# Patient Record
Sex: Male | Born: 1945 | Race: White | Hispanic: No | Marital: Married | Smoking: Never smoker
Health system: Southern US, Community
[De-identification: ages and names within clinical notes are randomized; demographics above are authoritative.]

## PROBLEM LIST (undated history)

## (undated) DIAGNOSIS — E1165 Type 2 diabetes mellitus with hyperglycemia: Secondary | ICD-10-CM

## (undated) DIAGNOSIS — Z9889 Other specified postprocedural states: Secondary | ICD-10-CM

## (undated) DIAGNOSIS — N400 Enlarged prostate without lower urinary tract symptoms: Secondary | ICD-10-CM

## (undated) DIAGNOSIS — R229 Localized swelling, mass and lump, unspecified: Secondary | ICD-10-CM

## (undated) DIAGNOSIS — K760 Fatty (change of) liver, not elsewhere classified: Secondary | ICD-10-CM

## (undated) DIAGNOSIS — R112 Nausea with vomiting, unspecified: Secondary | ICD-10-CM

## (undated) DIAGNOSIS — N200 Calculus of kidney: Secondary | ICD-10-CM

## (undated) DIAGNOSIS — E291 Testicular hypofunction: Secondary | ICD-10-CM

## (undated) DIAGNOSIS — D869 Sarcoidosis, unspecified: Secondary | ICD-10-CM

## (undated) DIAGNOSIS — IMO0002 Reserved for concepts with insufficient information to code with codable children: Secondary | ICD-10-CM

## (undated) DIAGNOSIS — E119 Type 2 diabetes mellitus without complications: Secondary | ICD-10-CM

## (undated) DIAGNOSIS — N1330 Unspecified hydronephrosis: Secondary | ICD-10-CM

## (undated) DIAGNOSIS — R51 Headache: Secondary | ICD-10-CM

## (undated) DIAGNOSIS — R519 Headache, unspecified: Secondary | ICD-10-CM

## (undated) DIAGNOSIS — I10 Essential (primary) hypertension: Secondary | ICD-10-CM

## (undated) DIAGNOSIS — G4733 Obstructive sleep apnea (adult) (pediatric): Secondary | ICD-10-CM

## (undated) DIAGNOSIS — K219 Gastro-esophageal reflux disease without esophagitis: Secondary | ICD-10-CM

## (undated) DIAGNOSIS — M1990 Unspecified osteoarthritis, unspecified site: Secondary | ICD-10-CM

## (undated) DIAGNOSIS — Z87442 Personal history of urinary calculi: Secondary | ICD-10-CM

## (undated) DIAGNOSIS — E559 Vitamin D deficiency, unspecified: Secondary | ICD-10-CM

## (undated) HISTORY — DX: Vitamin D deficiency, unspecified: E55.9

## (undated) HISTORY — DX: Benign prostatic hyperplasia without lower urinary tract symptoms: N40.0

## (undated) HISTORY — PX: COLONOSCOPY: SHX174

## (undated) HISTORY — PX: KNEE ARTHROSCOPY: SUR90

## (undated) HISTORY — DX: Type 2 diabetes mellitus without complications: E11.9

## (undated) HISTORY — DX: Type 2 diabetes mellitus with hyperglycemia: E11.65

## (undated) HISTORY — DX: Obstructive sleep apnea (adult) (pediatric): G47.33

## (undated) HISTORY — DX: Essential (primary) hypertension: I10

## (undated) HISTORY — DX: Gastro-esophageal reflux disease without esophagitis: K21.9

## (undated) HISTORY — DX: Testicular hypofunction: E29.1

## (undated) HISTORY — PX: APPENDECTOMY: SHX54

---
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
## 1994-07-24 HISTORY — PX: ROTATOR CUFF REPAIR: SHX139

## 1995-07-25 HISTORY — PX: ROTATOR CUFF REPAIR: SHX139

## 1998-10-29 ENCOUNTER — Ambulatory Visit (HOSPITAL_COMMUNITY): Admission: RE | Admit: 1998-10-29 | Discharge: 1998-10-29 | Payer: Self-pay | Admitting: Internal Medicine

## 1999-12-02 ENCOUNTER — Ambulatory Visit (HOSPITAL_COMMUNITY): Admission: RE | Admit: 1999-12-02 | Discharge: 1999-12-02 | Payer: Self-pay | Admitting: Internal Medicine

## 1999-12-02 ENCOUNTER — Encounter: Payer: Self-pay | Admitting: Internal Medicine

## 2000-11-30 ENCOUNTER — Encounter: Payer: Self-pay | Admitting: Internal Medicine

## 2000-11-30 ENCOUNTER — Ambulatory Visit (HOSPITAL_COMMUNITY): Admission: RE | Admit: 2000-11-30 | Discharge: 2000-11-30 | Payer: Self-pay | Admitting: Internal Medicine

## 2002-07-24 HISTORY — PX: HERNIA REPAIR: SHX51

## 2002-09-26 ENCOUNTER — Encounter: Payer: Self-pay | Admitting: Surgery

## 2002-09-26 ENCOUNTER — Ambulatory Visit (HOSPITAL_COMMUNITY): Admission: RE | Admit: 2002-09-26 | Discharge: 2002-09-26 | Payer: Self-pay | Admitting: Surgery

## 2005-07-24 LAB — HM COLONOSCOPY

## 2005-11-19 ENCOUNTER — Emergency Department (HOSPITAL_COMMUNITY): Admission: EM | Admit: 2005-11-19 | Discharge: 2005-11-19 | Payer: Self-pay | Admitting: Emergency Medicine

## 2006-01-07 ENCOUNTER — Encounter: Admission: RE | Admit: 2006-01-07 | Discharge: 2006-01-07 | Payer: Self-pay | Admitting: Orthopedic Surgery

## 2006-02-27 ENCOUNTER — Ambulatory Visit (HOSPITAL_COMMUNITY): Admission: RE | Admit: 2006-02-27 | Discharge: 2006-02-27 | Payer: Self-pay | Admitting: Orthopedic Surgery

## 2006-04-04 ENCOUNTER — Ambulatory Visit (HOSPITAL_COMMUNITY): Admission: RE | Admit: 2006-04-04 | Discharge: 2006-04-04 | Payer: Self-pay | Admitting: Gastroenterology

## 2006-04-04 ENCOUNTER — Encounter (INDEPENDENT_AMBULATORY_CARE_PROVIDER_SITE_OTHER): Payer: Self-pay | Admitting: Specialist

## 2007-04-01 ENCOUNTER — Ambulatory Visit (HOSPITAL_COMMUNITY): Admission: RE | Admit: 2007-04-01 | Discharge: 2007-04-01 | Payer: Self-pay | Admitting: Internal Medicine

## 2010-12-09 NOTE — Op Note (Signed)
   NAME:  Phillip Powell, Phillip Powell                        ACCOUNT NO.:  [id number]   MEDICAL RECORD NO.:  [id number]                   PATIENT TYPE:  AMB   LOCATION:  DAY                                  FACILITY:  [facility]   PHYSICIAN:  Coralie Keens, M.D.              DATE OF BIRTH:  [birth year]   DATE OF PROCEDURE:  09/26/2002  DATE OF DISCHARGE:                                 OPERATIVE REPORT   PREOPERATIVE DIAGNOSIS:  Umbilical hernia.   POSTOPERATIVE DIAGNOSIS:  Umbilical hernia.   PROCEDURE:  Umbilical hernia repair.   SURGEON:  Douglas A. Ninfa Linden, M.D.   ANESTHESIA:  General endotracheal anesthesia and 0.25% Marcaine.   ESTIMATED BLOOD LOSS:  Minimal.   PROCEDURE IN DETAIL:  The patient was brought to the operating room and  identified as [name].  He was placed supine on the operating table,  and general anesthesia was induced.  His abdomen was then prepped and draped  in the usual sterile fashion.  Using a #15 blade, a small transverse  incision made just above the upper edge at the umbilicus.  The incision was  carried down to the hernia sac which was easily identified and excised.  It  did contain only omentum.  The omentum was then placed back into the  umbilical cavity.  The umbilical hernia defect itself was quite small.  The  defect was closed with several figure-of-eight #1 Novofil pop-off sutures.  The umbilical skin was then tacked back in place with a single 2-0 Vicryl  suture.  The subcutaneous layer was closed with interrupted 3-0 Vicryl  sutures.  Skin was closed with running 4-0 Monocryl.  Steri-Strips, gauze,  and tape were then applied.  The patient tolerated the procedure well.  All  sponge, needle, and instrument counts were correct at the end of the  procedure.  The patient was then extubated in the operating room and taken  in stable condition to the recovery room.                                               Coralie Keens, M.D.    DB/MEDQ   D:  09/26/2002  T:  09/26/2002  Job:  [id number]

## 2010-12-09 NOTE — Op Note (Signed)
NAME:  Phillip Powell, Phillip Powell              ACCOUNT NO.:  [id number]   MEDICAL RECORD NO.:  [id number]          PATIENT TYPE:  AMB   LOCATION:  ENDO                         FACILITY:  [facility]   PHYSICIAN:  Nelwyn Salisbury, M.D.  DATE OF BIRTH:  January 22, 1946   DATE OF PROCEDURE:  04/04/2006  DATE OF DISCHARGE:                                 OPERATIVE REPORT   PROCEDURE PERFORMED:  Colonoscopy with cold biopsies x6.   ENDOSCOPIST:  Nelwyn Salisbury, M.D.   INSTRUMENT USED:  Olympus video colonoscope.   INDICATION FOR PROCEDURE:  A 65 year old white male with a family history of  colon cancer in his paternal grandmother, undergoing a screening colonoscopy  to repeat colonic polyps, masses, etc.   PREPROCEDURE PREPARATION:  Informed consent was procured from the patient.  The patient was fasted for four hours prior to the procedure and prepped  with 20 OsmoPrep pills the night prior to the procedure and 12 OsmoPrep  pills the morning of the procedure.  The risks and benefits of the  procedure, including a 10% miss rate of cancer and polyps, were discussed  with the patient as well.   PREPROCEDURE PHYSICAL:  VITAL SIGNS:  The patient had stable vital signs.  NECK:  Supple.  CHEST:  Clear to auscultation.  S1, S2 regular.  ABDOMEN:  Soft with normal bowel sounds.   DESCRIPTION OF PROCEDURE:  The patient was placed in the left lateral  decubitus position and sedated with 75 mcg of fentanyl and 7.5 mg of Versed  in slow incremental doses.  Once the patient was adequately sedate and  maintained on low-flow oxygen and continuous cardiac monitoring, the Olympus  video colonoscope was advanced from the rectum to the cecum.  The  appendiceal orifice and ileocecal valve were clearly visualized and  photographed.  Multiple washes were done.  The terminal ileum appeared  healthy and without lesions.  A small sessile polyp was biopsied from the  proximal right colon (cold biopsies x2) and another small  sessile polyp was  biopsied from the rectum (cold biopsies x4).  Retroflexion in the rectum  revealed small internal hemorrhoids.  The patient tolerated the procedure  well without complications.  There was no evidence of diverticulosis.   IMPRESSION:  1. One small sessile polyp biopsied from the rectum (cold biopsies x4).  2. Another small sessile polyp biopsied from the proximal right colon      (cold biopsies x2).  3. Small internal hemorrhoids seen on retroflexion.  4. Normal terminal ileum.  5. No evidence of diverticulosis.   RECOMMENDATIONS:  1. Await pathology results.  2. Repeat colonoscopy depending on pathology results.  3. Avoid all nonsteroidals including aspirin for the next 2 weeks.  4. Outpatient follow-up as the need arises in the future.      Nelwyn Salisbury, M.D.  Electronically Signed     JNM/MEDQ  D:  04/04/2006  T:  04/05/2006  Job:  [id number]   cc:   Unk Pinto, M.D.

## 2010-12-09 NOTE — Op Note (Signed)
NAME:  Phillip Powell, Phillip Powell              ACCOUNT NO.:  [id number]   MEDICAL RECORD NO.:  [id number]          PATIENT TYPE:  AMB   LOCATION:  DAY                          FACILITY:  [facility]   PHYSICIAN:  Ronald A. Gioffre, M.D.DATE OF BIRTH:  07-Oct-1945   DATE OF PROCEDURE:  02/27/2006  DATE OF DISCHARGE:                                 OPERATIVE REPORT   PREOPERATIVE DIAGNOSIS:  1.  Degenerative arthritis of the left knee.  2.  Torn medial meniscus of the left knee.  3.  Torn lateral meniscus of the left knee.  4.  Chronic synovitis of the left knee.   POSTOPERATIVE DIAGNOSIS:  1.  Degenerative arthritis of the left knee.  2.  Torn medial meniscus of the left knee.  3.  Torn lateral meniscus of the left knee.  4.  Chronic synovitis of the left knee.   OPERATION:  1.  Diagnostic arthroscopy left knee.  2.  Medial meniscectomy left knee.  3.  Lateral meniscectomy left knee.  4.  Abrasion chondroplasty medial femoral condyle left knee.  5.  Synovectomy suprapatellar pouch left knee.   PROCEDURE:  Under general anesthesia, routine orthopedic prepping and  draping of the left knee was carried out.  A small punctate incision was  made in the suprapatellar pouch, inflow cannula was inserted, and the knee  was distended with saline.  Following that, another small punctate incision  was made in the anterolateral joint.  The arthroscope was entered with a  lateral approach and complete diagnostic arthroscopy was carried out.  I  introduced a resection device in the medial approach and did a partial  medial meniscectomy.  He had a large irregular tear of the posterior horn of  the medial meniscus.  He had severe arthritic changes in the medial joint.  I did an abrasion chondroplasty of the medial femoral condyle.  Following  that, I entered the lateral joint and did a lateral meniscectomy.  He had  arthritic changes in the lateral joint, as well.  I went up into the  suprapatellar pouch, he had  severe synovitis.  I introduced the [employer]  and cleaned out the knee and did a synovectomy.  Following that, we went  down and examined the cruciates, the cruciates were intact but the anterior  cruciate was quite irregular consistent with his arthritic condition.  I  thoroughly irrigated out the knee, cleaned the knee out, and removed all the  fluid.  I closed all three punctate incisions with 3-0 nylon suture.  I  injected 30 mL of 0.5% Marcaine with epinephrine into the knee joint.  Sterile Neosporin bundle dressing was applied.  He had 1 gram of IV Ancef  preoperatively.          ______________________________  Kipp Brood Gladstone Lighter, M.D.    RAG/MEDQ  D:  02/27/2006  T:  02/27/2006  Job:  [id number]

## 2012-08-19 ENCOUNTER — Other Ambulatory Visit (HOSPITAL_COMMUNITY): Payer: Self-pay | Admitting: Internal Medicine

## 2012-08-19 ENCOUNTER — Ambulatory Visit (HOSPITAL_COMMUNITY)
Admission: RE | Admit: 2012-08-19 | Discharge: 2012-08-19 | Disposition: A | Discharge status: Home / Self Care | Payer: Managed Care, Other (non HMO) | Source: Ambulatory Visit | Attending: Internal Medicine | Admitting: Internal Medicine

## 2012-08-19 DIAGNOSIS — I10 Essential (primary) hypertension: Secondary | ICD-10-CM | POA: Insufficient documentation

## 2012-08-19 DIAGNOSIS — E119 Type 2 diabetes mellitus without complications: Secondary | ICD-10-CM | POA: Insufficient documentation

## 2012-08-19 DIAGNOSIS — Z0000 Encounter for general adult medical examination without abnormal findings: Secondary | ICD-10-CM | POA: Insufficient documentation

## 2013-06-21 ENCOUNTER — Encounter: Payer: Self-pay | Admitting: Internal Medicine

## 2013-06-21 DIAGNOSIS — G4733 Obstructive sleep apnea (adult) (pediatric): Secondary | ICD-10-CM | POA: Insufficient documentation

## 2013-06-21 DIAGNOSIS — K219 Gastro-esophageal reflux disease without esophagitis: Secondary | ICD-10-CM | POA: Insufficient documentation

## 2013-06-21 DIAGNOSIS — N400 Enlarged prostate without lower urinary tract symptoms: Secondary | ICD-10-CM | POA: Insufficient documentation

## 2013-06-21 DIAGNOSIS — E1165 Type 2 diabetes mellitus with hyperglycemia: Secondary | ICD-10-CM | POA: Insufficient documentation

## 2013-06-21 DIAGNOSIS — I10 Essential (primary) hypertension: Secondary | ICD-10-CM | POA: Insufficient documentation

## 2013-06-21 DIAGNOSIS — E119 Type 2 diabetes mellitus without complications: Secondary | ICD-10-CM | POA: Insufficient documentation

## 2013-06-21 DIAGNOSIS — E559 Vitamin D deficiency, unspecified: Secondary | ICD-10-CM | POA: Insufficient documentation

## 2013-06-21 DIAGNOSIS — E291 Testicular hypofunction: Secondary | ICD-10-CM | POA: Insufficient documentation

## 2013-06-23 ENCOUNTER — Encounter: Payer: Self-pay | Admitting: Emergency Medicine

## 2013-06-23 ENCOUNTER — Ambulatory Visit (INDEPENDENT_AMBULATORY_CARE_PROVIDER_SITE_OTHER): Payer: Managed Care, Other (non HMO) | Admitting: Emergency Medicine

## 2013-06-23 VITALS — BP 130/88 | HR 78 | Temp 97.8°F | Resp 16 | Ht 68.5 in | Wt 248.0 lb

## 2013-06-23 DIAGNOSIS — E559 Vitamin D deficiency, unspecified: Secondary | ICD-10-CM

## 2013-06-23 DIAGNOSIS — Z23 Encounter for immunization: Secondary | ICD-10-CM

## 2013-06-23 DIAGNOSIS — Z79899 Other long term (current) drug therapy: Secondary | ICD-10-CM

## 2013-06-23 DIAGNOSIS — I10 Essential (primary) hypertension: Secondary | ICD-10-CM

## 2013-06-23 DIAGNOSIS — E782 Mixed hyperlipidemia: Secondary | ICD-10-CM

## 2013-06-23 DIAGNOSIS — E119 Type 2 diabetes mellitus without complications: Secondary | ICD-10-CM

## 2013-06-23 LAB — CBC WITH DIFFERENTIAL/PLATELET
Basophils Absolute: 0 10*3/uL (ref 0.0–0.1)
Basophils Relative: 0 % (ref 0–1)
Eosinophils Absolute: 0.1 10*3/uL (ref 0.0–0.7)
Eosinophils Relative: 1 % (ref 0–5)
HCT: 46.4 % (ref 39.0–52.0)
Hemoglobin: 15.9 g/dL (ref 13.0–17.0)
Lymphocytes Relative: 25 % (ref 12–46)
Lymphs Abs: 2.4 10*3/uL (ref 0.7–4.0)
MCH: 27.3 pg (ref 26.0–34.0)
MCHC: 34.3 g/dL (ref 30.0–36.0)
MCV: 79.6 fL (ref 78.0–100.0)
Monocytes Absolute: 0.8 10*3/uL (ref 0.1–1.0)
Monocytes Relative: 9 % (ref 3–12)
Neutro Abs: 6.1 10*3/uL (ref 1.7–7.7)
Neutrophils Relative %: 65 % (ref 43–77)
Platelets: 218 10*3/uL (ref 150–400)
RBC: 5.83 MIL/uL — ABNORMAL HIGH (ref 4.22–5.81)
RDW: 14.6 % (ref 11.5–15.5)
WBC: 9.5 10*3/uL (ref 4.0–10.5)

## 2013-06-23 LAB — HEPATIC FUNCTION PANEL
ALT: 19 U/L (ref 0–53)
AST: 21 U/L (ref 0–37)
Albumin: 4.2 g/dL (ref 3.5–5.2)
Alkaline Phosphatase: 65 U/L (ref 39–117)
Bilirubin, Direct: 0.1 mg/dL (ref 0.0–0.3)
Indirect Bilirubin: 0.6 mg/dL (ref 0.0–0.9)
Total Bilirubin: 0.7 mg/dL (ref 0.3–1.2)
Total Protein: 6.7 g/dL (ref 6.0–8.3)

## 2013-06-23 LAB — BASIC METABOLIC PANEL WITH GFR
BUN: 17 mg/dL (ref 6–23)
CO2: 31 mEq/L (ref 19–32)
Calcium: 9.8 mg/dL (ref 8.4–10.5)
Chloride: 97 mEq/L (ref 96–112)
Creat: 1.08 mg/dL (ref 0.50–1.35)
GFR, Est African American: 83 mL/min
GFR, Est Non African American: 72 mL/min
Glucose, Bld: 203 mg/dL — ABNORMAL HIGH (ref 70–99)
Potassium: 4.7 mEq/L (ref 3.5–5.3)
Sodium: 136 mEq/L (ref 135–145)

## 2013-06-23 LAB — HEMOGLOBIN A1C
Hgb A1c MFr Bld: 8.5 % — ABNORMAL HIGH (ref <5.7)
Mean Plasma Glucose: 197 mg/dL — ABNORMAL HIGH (ref <117)

## 2013-06-23 LAB — LIPID PANEL
Cholesterol: 175 mg/dL (ref 0–200)
HDL: 32 mg/dL — ABNORMAL LOW (ref >39)
LDL Cholesterol: 100 mg/dL — ABNORMAL HIGH (ref 0–99)
Total CHOL/HDL Ratio: 5.5 Ratio
Triglycerides: 214 mg/dL — ABNORMAL HIGH (ref <150)
VLDL: 43 mg/dL — ABNORMAL HIGH (ref 0–40)

## 2013-06-23 LAB — MAGNESIUM: Magnesium: 1.7 mg/dL (ref 1.5–2.5)

## 2013-06-23 MED ORDER — TESTOSTERONE 10 MG/ACT (2%) TD GEL: TRANSDERMAL | Status: DC

## 2013-06-23 NOTE — Patient Instructions (Signed)

## 2013-06-24 LAB — INSULIN, FASTING: Insulin fasting, serum: 28 u[IU]/mL (ref 3–28)

## 2013-06-24 LAB — VITAMIN D 25 HYDROXY (VIT D DEFICIENCY, FRACTURES): Vit D, 25-Hydroxy: 61 ng/mL (ref 30–89)

## 2013-06-24 NOTE — Progress Notes (Signed)
Subjective:    Patient ID: Phillip Powell, male    DOB: 07/28/1945, 67 y.o.   MRN: [id number]  HPI Comments: 67 yo presents for 3 month F/U for Obesity, HTN, Cholesterol, DM, D. Deficient. He has been trying to lose wt with decreased portions. He is eating descent but not as healthy as he should be. He has not been checking BS as regularly. He does not exercise regularly. He was not able to tolerate Invokana SX due to urine frequency with his job. He refuses retrial of Metformin despite uncontrolled levels. Last Abnormal LABS BS 267 TG 229 A1C 8.8 INSULIN 77. He does not check BP AD. He does check feet QD without any signs of diabetic complications.  He also has hypogonadism with low level at last OV but does not use RX AD.   Hypertension  Hyperlipidemia  Gastrophageal Reflux  Diabetes     Current Outpatient Prescriptions on File Prior to Visit  Medication Sig Dispense Refill  . atenolol (TENORMIN) 100 MG tablet Take 100 mg by mouth daily.      . Cholecalciferol (VITAMIN D) 2000 UNITS tablet Take 5,000 Units by mouth daily.       Marland Kitchen glimepiride (AMARYL) 4 MG tablet Take 4 mg by mouth 2 (two) times daily.      Marland Kitchen losartan-hydrochlorothiazide (HYZAAR) 100-25 MG per tablet Take 1 tablet by mouth daily.      . Magnesium 250 MG TABS Take by mouth 2 (two) times daily.      . ranitidine (ZANTAC) 300 MG tablet Take 300 mg by mouth at bedtime.      . sitaGLIPtin (JANUVIA) 100 MG tablet Take 100 mg by mouth daily.       No current facility-administered medications on file prior to visit.   ALLERGIES Ace inhibitors; Ibuprofen; Invokana; Metformin and related; and Quinidine   Past Medical History  Diagnosis Date  . Hypertension   . Diabetes mellitus without complication   . GERD (gastroesophageal reflux disease)   . Hypogonadism male   . OSA (obstructive sleep apnea)   . Vitamin D deficiency   . BPH (benign prostatic hypertrophy)     Review of Systems  All other systems reviewed  and are negative.    BP 130/88  Pulse 78  Temp(Src) 97.8 F (36.6 C) (Temporal)  Resp 16  Ht 5' 8.5" (1.74 m)  Wt 248 lb (112.492 kg)  BMI 37.16 kg/m2     Objective:   Physical Exam  Nursing note and vitals reviewed. Constitutional: He is oriented to person, place, and time. He appears well-developed and well-nourished.  HENT:  Head: Normocephalic and atraumatic.  Right Ear: External ear normal.  Left Ear: External ear normal.  Mouth/Throat: Oropharynx is clear and moist.  Eyes: Pupils are equal, round, and reactive to light.  Neck: Normal range of motion. Neck supple. No JVD present. No thyromegaly present.  Cardiovascular: Normal rate, regular rhythm, normal heart sounds and intact distal pulses.   Pulmonary/Chest: Effort normal and breath sounds normal.  Abdominal: Soft. Bowel sounds are normal. He exhibits no distension. There is no tenderness.  Musculoskeletal: Normal range of motion.  Lymphadenopathy:    He has no cervical adenopathy.  Neurological: He is alert and oriented to person, place, and time. No cranial nerve deficit.  Skin: Skin is warm and dry.  Psychiatric: He has a normal mood and affect. Judgment normal.          Assessment & Plan:  1. 3 month  F/U for Obesity, HTN, Cholesterol, DM, D. Deficient check labs. Try Janumet 50/500 mg sample x 1 box, 1 qd to see if tolerates metformin. Needs to continue wt loss, better diet and increase cardio. Needs to check BP/ BS routinely and call with abnormal. 2. Hypogonadism- take RX AD

## 2013-08-14 ENCOUNTER — Other Ambulatory Visit: Payer: Self-pay | Admitting: Internal Medicine

## 2013-08-18 ENCOUNTER — Other Ambulatory Visit: Payer: Self-pay | Admitting: Internal Medicine

## 2013-08-18 ENCOUNTER — Ambulatory Visit: Payer: Managed Care, Other (non HMO) | Admitting: Internal Medicine

## 2013-08-18 DIAGNOSIS — E785 Hyperlipidemia, unspecified: Secondary | ICD-10-CM

## 2013-08-18 DIAGNOSIS — Z79899 Other long term (current) drug therapy: Secondary | ICD-10-CM | POA: Insufficient documentation

## 2013-08-18 DIAGNOSIS — E1169 Type 2 diabetes mellitus with other specified complication: Secondary | ICD-10-CM | POA: Insufficient documentation

## 2013-08-18 NOTE — Progress Notes (Signed)
Patient ID: Phillip Powell, male   DOB: March 20, 1946, 68 y.o.   MRN: [id number]  Annual Screening Comprehensive Examination  This very nice 68 y.o.  male presents for complete physical.  Patient has been followed for HTN, T2 Diabetes, Hyperlipidemia, and Vitamin D Deficiency.   HTN predates since     . Patient's BP has been controlled at home.Today's  . Patient denies any cardiac symptoms as chest pain, palpitations, shortness of breath, dizziness or ankle swelling.   Patient's hyperlipidemia is controlled with diet and medications. Patient denies myalgias or other medication SE's. Last cholesterol last visit was  , triglycerides  , HDL    and LDL   .     Patient has T2 NIDDM since  with last A1c  . Patient denies reactive hypoglycemic symptoms, visual blurring, diabetic polys, or paresthesias.   Finally, patient has history of Vitamin D Deficiency with last vitamin D        Medication List       This list is accurate as of: 08/18/13  1:13 AM.  Always use your most recent med list.               atenolol 100 MG tablet  Commonly known as:  TENORMIN  Take 100 mg by mouth daily.     glimepiride 4 MG tablet  Commonly known as:  AMARYL  Take 4 mg by mouth 2 (two) times daily.     hydrochlorothiazide 25 MG tablet  Commonly known as:  HYDRODIURIL     losartan 100 MG tablet  Commonly known as:  COZAAR     losartan-hydrochlorothiazide 100-25 MG per tablet  Commonly known as:  HYZAAR  Take 1 tablet by mouth daily.     Magnesium 250 MG Tabs  Take by mouth 2 (two) times daily.     meloxicam 15 MG tablet  Commonly known as:  MOBIC     ranitidine 300 MG tablet  Commonly known as:  ZANTAC  Take 300 mg by mouth at bedtime.     sitaGLIPtin 100 MG tablet  Commonly known as:  JANUVIA  Take 100 mg by mouth daily.     Testosterone 10 MG/ACT (2%) Gel  Commonly known as:  FORTESTA  2 pumps qd     TESTIM 50 MG/5GM Gel  Generic drug:  testosterone  APPLY 2 TUBES ONCE DAILY AS  DIRECTED     Vitamin D 2000 UNITS tablet  Take 5,000 Units by mouth daily.        Allergies  Allergen Reactions  . Ace Inhibitors   . Ibuprofen     GI upset  . Invokana [Canagliflozin]   . Metformin And Related     Diarrhea  . Quinidine     Past Medical History  Diagnosis Date  . Hypertension   . Diabetes mellitus without complication   . GERD (gastroesophageal reflux disease)   . Hypogonadism male   . OSA (obstructive sleep apnea)   . Vitamin D deficiency   . BPH (benign prostatic hypertrophy)     Past Surgical History  Procedure Laterality Date  . Tonsillectomy  1952  . Rotator cuff repair Left 1996  . Rotator cuff repair Right 1997  . Hernia repair  [id number]    umbilical    Family History  Problem Relation Age of Onset  . Cancer Mother     throat  . Diabetes Mother   . Thyroid disease Father   . Cancer Father  lung  . Thyroid disease Sister   . Hypertension Son   . Hyperlipidemia Son   . Cancer Paternal Grandmother     colon    History   Social History  . Marital Status: Married    Spouse Name: N/A    Number of Children: N/A  . Years of Education: N/A   Occupational History  . Not on file.   Social History Main Topics  . Smoking status: Never Smoker   . Smokeless tobacco: Not on file  . Alcohol Use: Not on file  . Drug Use: Not on file  . Sexual Activity: Not on file   Other Topics Concern  . Not on file   Social History Narrative  . No narrative on file    ROS Constitutional: Denies fever, chills, weight loss/gain, headaches, insomnia, fatigue, night sweats, and change in appetite. Eyes: Denies redness, blurred vision, diplopia, discharge, itchy, watery eyes.  ENT: Denies discharge, congestion, post nasal drip, epistaxis, sore throat, earache, hearing loss, dental pain, Tinnitus, Vertigo, Sinus pain, snoring.  Cardio: Denies chest pain, palpitations, irregular heartbeat, syncope, dyspnea, diaphoresis, orthopnea, PND, claudication,  edema Respiratory: denies cough, dyspnea, DOE, pleurisy, hoarseness, laryngitis, wheezing.  Gastrointestinal: Denies dysphagia, heartburn, reflux, water brash, pain, cramps, nausea, vomiting, bloating, diarrhea, constipation, hematemesis, melena, hematochezia, jaundice, hemorrhoids Genitourinary: Denies dysuria, frequency, urgency, nocturia, hesitancy, discharge, hematuria, flank pain Musculoskeletal: Denies arthralgia, myalgia, stiffness, Jt. Swelling, pain, limp, and strain/sprain. Skin: Denies puritis, rash, hives, warts, acne, eczema, changing in skin lesion Neuro: No weakness, tremor, incoordination, spasms, paresthesia, pain Psychiatric: Denies confusion, memory loss, sensory loss Endocrine: Denies change in weight, skin, hair change, nocturia, and paresthesia, diabetic polys, visual blurring, hyper / hypo glycemic episodes.  Heme/Lymph: No excessive bleeding, bruising, or elarged lymph nodes.  There were no vitals filed for this visit.  Estimated body mass index is 37.16 kg/(m^2) as calculated from the following:   Height as of 06/23/13: 5' 8.5" (1.74 m).   Weight as of 06/23/13: 248 lb (112.492 kg).  Physical Exam General Appearance: Well nourished, in no apparent distress. Eyes: PERRLA, EOMs, conjunctiva no swelling or erythema, normal fundi and vessels. Sinuses: No frontal/maxillary tenderness ENT/Mouth: EACs patent / TMs  nl. Nares clear without erythema, swelling, mucoid exudates. Oral hygiene is good. No erythema, swelling, or exudate. Tongue normal, non-obstructing. Tonsils not swollen or erythematous. Hearing normal.  Neck: Supple, thyroid normal. No bruits, nodes or JVD. Respiratory: Respiratory effort normal.  BS equal and clear bilateral without rales, rhonci, wheezing or stridor. Cardio: Heart sounds are normal with regular rate and rhythm and no murmurs, rubs or gallops. Peripheral pulses are normal and equal bilaterally without edema. No aortic or femoral bruits. Chest:  symmetric with normal excursions and percussion.  Abdomen: Flat, soft, with bowl sounds. Nontender, no guarding, rebound, hernias, masses, or organomegaly.  Lymphatics: Non tender without lymphadenopathy.  Genitourinary: No hernias.Testes nl. DRE - prostate nl for age - smooth & firm w/o nodules. Musculoskeletal: Full ROM all peripheral extremities, joint stability, 5/5 strength, and normal gait. Skin: Warm and dry without rashes, lesions, cyanosis, clubbing or  ecchymosis.  Neuro: Cranial nerves intact, reflexes equal bilaterally. Normal muscle tone, no cerebellar symptoms. Sensation intact.  Pysch: Awake and oriented X 3, normal affect, insight and judgment appropriate.   Assessment and Plan  1. Annual Screening Examination 2. Hypertension  3. Hyperlipidemia 4. T2 NIDDM 5. Vitamin D Deficiency  Continue prudent diet as discussed, weight control, BP monitoring, regular exercise, and medications as discussed.  Discussed med effects and SE's. Routine screening labs and tests as requested with regular follow-up as recommended.

## 2013-08-18 NOTE — Patient Instructions (Signed)

## 2013-08-25 ENCOUNTER — Encounter: Payer: Self-pay | Admitting: Emergency Medicine

## 2013-08-25 ENCOUNTER — Ambulatory Visit (INDEPENDENT_AMBULATORY_CARE_PROVIDER_SITE_OTHER): Payer: Managed Care, Other (non HMO) | Admitting: Emergency Medicine

## 2013-08-25 VITALS — BP 124/80 | HR 72 | Temp 98.0°F | Resp 18 | Ht 68.5 in | Wt 252.0 lb

## 2013-08-25 DIAGNOSIS — E782 Mixed hyperlipidemia: Secondary | ICD-10-CM

## 2013-08-25 DIAGNOSIS — E119 Type 2 diabetes mellitus without complications: Secondary | ICD-10-CM

## 2013-08-25 DIAGNOSIS — I10 Essential (primary) hypertension: Secondary | ICD-10-CM

## 2013-08-25 LAB — CBC WITH DIFFERENTIAL/PLATELET
Basophils Absolute: 0.1 10*3/uL (ref 0.0–0.1)
Basophils Relative: 1 % (ref 0–1)
Eosinophils Absolute: 0.1 10*3/uL (ref 0.0–0.7)
Eosinophils Relative: 2 % (ref 0–5)
HCT: 45.4 % (ref 39.0–52.0)
Hemoglobin: 15.6 g/dL (ref 13.0–17.0)
Lymphocytes Relative: 28 % (ref 12–46)
Lymphs Abs: 2.2 10*3/uL (ref 0.7–4.0)
MCH: 27.9 pg (ref 26.0–34.0)
MCHC: 34.4 g/dL (ref 30.0–36.0)
MCV: 81.2 fL (ref 78.0–100.0)
Monocytes Absolute: 0.5 10*3/uL (ref 0.1–1.0)
Monocytes Relative: 7 % (ref 3–12)
Neutro Abs: 5 10*3/uL (ref 1.7–7.7)
Neutrophils Relative %: 62 % (ref 43–77)
Platelets: 201 10*3/uL (ref 150–400)
RBC: 5.59 MIL/uL (ref 4.22–5.81)
RDW: 14.5 % (ref 11.5–15.5)
WBC: 7.9 10*3/uL (ref 4.0–10.5)

## 2013-08-25 LAB — LIPID PANEL
Cholesterol: 161 mg/dL (ref 0–200)
HDL: 33 mg/dL — ABNORMAL LOW (ref >39)
LDL Cholesterol: 91 mg/dL (ref 0–99)
Total CHOL/HDL Ratio: 4.9 Ratio
Triglycerides: 187 mg/dL — ABNORMAL HIGH (ref <150)
VLDL: 37 mg/dL (ref 0–40)

## 2013-08-25 LAB — HEPATIC FUNCTION PANEL
ALT: 23 U/L (ref 0–53)
AST: 18 U/L (ref 0–37)
Albumin: 4.3 g/dL (ref 3.5–5.2)
Alkaline Phosphatase: 56 U/L (ref 39–117)
Bilirubin, Direct: 0.2 mg/dL (ref 0.0–0.3)
Indirect Bilirubin: 0.5 mg/dL (ref 0.2–1.2)
Total Bilirubin: 0.7 mg/dL (ref 0.2–1.2)
Total Protein: 6.4 g/dL (ref 6.0–8.3)

## 2013-08-25 LAB — BASIC METABOLIC PANEL WITH GFR
BUN: 14 mg/dL (ref 6–23)
CO2: 29 mEq/L (ref 19–32)
Calcium: 10.1 mg/dL (ref 8.4–10.5)
Chloride: 98 mEq/L (ref 96–112)
Creat: 0.96 mg/dL (ref 0.50–1.35)
GFR, Est African American: >89 mL/min
GFR, Est Non African American: 82 mL/min
Glucose, Bld: 199 mg/dL — ABNORMAL HIGH (ref 70–99)
Potassium: 4.1 mEq/L (ref 3.5–5.3)
Sodium: 137 mEq/L (ref 135–145)

## 2013-08-25 LAB — HEMOGLOBIN A1C
Hgb A1c MFr Bld: 8.4 % — ABNORMAL HIGH (ref <5.7)
Mean Plasma Glucose: 194 mg/dL — ABNORMAL HIGH (ref <117)

## 2013-08-25 MED ORDER — SITAGLIPTIN PHOS-METFORMIN HCL 50-500 MG PO TABS: 1.0000 | ORAL_TABLET | Freq: Two times a day (BID) | ORAL | Status: DC

## 2013-08-25 NOTE — Progress Notes (Signed)
Subjective:    Patient ID: Phillip Powell, male    DOB: 1946-01-03, 68 y.o.   MRN: [id number]  HPI Comments: 68 yo obese male presents for 3 month F/U for HTN, Cholesterol, DM, D. Deficient. He notes BP good at home. He started Janumet 50/500 at last OV BS has been 130-150. He can not tolerate higher dose due to diarrhea of metformin. He is eating a little better except holidays. He is checking feet routinely and denies skin break down or numbness. He keeps busy with work but denies cardio. He occasionally gets sick stomach if skips or prolonged time between eating.  LAST LABS BS 267 T 158 TG 229 H 30 L 82 A1C 8.8 MAG 1.7 INSULIN 77 D 52  Current Outpatient Prescriptions on File Prior to Visit  Medication Sig Dispense Refill  . atenolol (TENORMIN) 100 MG tablet Take 100 mg by mouth daily.      . Cholecalciferol (VITAMIN D) 2000 UNITS tablet Take 5,000 Units by mouth daily.       Marland Kitchen glimepiride (AMARYL) 4 MG tablet Take 4 mg by mouth 2 (two) times daily.      . hydrochlorothiazide (HYDRODIURIL) 25 MG tablet       . Magnesium 250 MG TABS Take by mouth 2 (two) times daily.      . meloxicam (MOBIC) 15 MG tablet       . ranitidine (ZANTAC) 300 MG tablet Take 300 mg by mouth at bedtime.      . TESTIM 50 MG/5GM GEL APPLY 2 TUBES ONCE DAILY AS DIRECTED  300 g  2  . losartan (COZAAR) 100 MG tablet       . losartan-hydrochlorothiazide (HYZAAR) 100-25 MG per tablet Take 1 tablet by mouth daily.       No current facility-administered medications on file prior to visit.   ALLERGIES Ace inhibitors; Ibuprofen; Invokana; Metformin and related; and Quinidine  Past Medical History  Diagnosis Date  . Hypertension   . Diabetes mellitus without complication   . GERD (gastroesophageal reflux disease)   . Hypogonadism male   . OSA (obstructive sleep apnea)   . Vitamin D deficiency   . BPH (benign prostatic hypertrophy)       Review of Systems  Gastrointestinal: Positive for nausea.  All other  systems reviewed and are negative.   BP 124/80  Pulse 72  Temp(Src) 98 F (36.7 C) (Temporal)  Resp 18  Ht 5' 8.5" (1.74 m)  Wt 252 lb (114.306 kg)  BMI 37.75 kg/m2     Objective:   Physical Exam  Nursing note and vitals reviewed. Constitutional: He is oriented to person, place, and time. He appears well-developed and well-nourished.  Obese  HENT:  Head: Normocephalic and atraumatic.  Right Ear: External ear normal.  Left Ear: External ear normal.  Nose: Nose normal.  Eyes: Conjunctivae and EOM are normal.  Neck: Normal range of motion. Neck supple. No JVD present. No thyromegaly present.  Cardiovascular: Normal rate, regular rhythm, normal heart sounds and intact distal pulses.   Pulmonary/Chest: Effort normal and breath sounds normal.  Abdominal: Soft. Bowel sounds are normal. He exhibits no distension and no mass. There is no tenderness. There is no rebound and no guarding.  Musculoskeletal: Normal range of motion. He exhibits no edema and no tenderness.  Lymphadenopathy:    He has no cervical adenopathy.  Neurological: He is alert and oriented to person, place, and time. He has normal reflexes. No cranial nerve deficit. Coordination  normal.  Skin: Skin is warm and dry.  Psychiatric: He has a normal mood and affect. His behavior is normal. Judgment and thought content normal.          Assessment & Plan:  1.  3 month F/U for Obesity, HTN, Cholesterol,DM, D. Deficient. Needs healthy diet, cardio QD and obtain healthy weight. Check Labs, Check BP if >130/80 call office, Check BS if >200 call office 2. Nausea- Advised better diet and q 2hr smaller portions, w/c if SX increase

## 2013-08-25 NOTE — Patient Instructions (Signed)
Diabetes, Eating Away From Home Sometimes, you might eat in a restaurant or have meals that are prepared by someone else. You can enjoy eating out. However, the portions in restaurants may be much larger than needed. Listed below are some ideas to help you choose foods that will keep your blood glucose (sugar) in better control.  TIPS FOR EATING OUT  Know your meal plan and how many carbohydrate servings you should have at each meal. You may wish to carry a copy of your meal plan in your purse or wallet. Learn the foods included in each food group.  Make a list of restaurants near you that offer healthy choices. Take a copy of the carry-out menus to see what they offer. Then, you can plan what you will order ahead of time.  Become familiar with serving sizes by practicing them at home using measuring cups and spoons. Once you learn to recognize portion sizes, you will be able to correctly estimate the amount of total carbohydrate you are allowed to eat at the restaurant. Ask for a takeout box if the portion is more than you should have. When your food comes, leave the amount you should have on the plate, and put the rest in the takeout box before you start eating.  Plan ahead if your mealtime will be different from usual. Check with your caregiver to find out how to time meals and medicine if you are taking insulin.  Avoid high-fat foods, such as fried foods, cream sauces, high-fat salad dressings, or any added butter or margarine.  Do not be afraid to ask questions. Ask your server about the portion size, cooking methods, ingredients and if items can be substituted. Restaurants do not list all available items on the menu. You can ask for your main entree to be prepared using skim milk, oil instead of butter or margarine, and without gravy or sauces. Ask your waiter or waitress to serve salad dressings, gravy, sauces, margarine, and sour cream on the side. You can then add the amount your meal plan  suggests.  Add more vegetables whenever possible.  Avoid items that are labeled "jumbo," "giant," "deluxe," or "supersized."  You may want to split an entre with someone and order an extra side salad.  Watch for hidden calories in foods like croutons, bacon, or cheese.  Ask your server to take away the bread basket or chips from your table.  Order a dinner salad as an appetizer. You can eat most foods served in a restaurant. Some foods are better choices than others. Breads and Starches  Recommended: All kinds of bread (wheat, rye, white, oatmeal, [place], [place], raisin), hard or soft dinner rolls, frankfurter or hamburger buns, small bagels, small corn or whole-wheat flour tortillas.  Avoid: Frosted or glazed breads, butter rolls, egg or cheese breads, croissants, sweet rolls, pastries, coffee cake, glazed or frosted doughnuts, muffins. Crackers  Recommended: Animal crackers, graham, rye, saltine, oyster, and matzoth crackers. Bread sticks, melba toast, rusks, pretzels, popcorn (without fat), zwieback toast.  Avoid: High-fat snack crackers or chips. Buttered popcorn. Cereals  Recommended: Hot and cold cereals. Whole grains such as oatmeal or shredded wheat are good choices.  Avoid: Sugar-coated or granola type cereals. Potatoes/Pasta/Rice/Beans  Recommended: Order baked, boiled, or mashed potatoes, rice or noodles without added fat, whole beans. Order gravies, butter, margarine, or sauces on the side so you can control the amount you add.  Avoid: Hash browns or fried potatoes. Potatoes, pasta, or rice prepared with cream or  cheese sauce. Potato or pasta salads prepared with large amounts of dressing. Fried beans or fried rice. Vegetables  Recommended: Order steamed, baked, boiled, or stewed vegetables without sauces or extra fat. Ask that sauce be served on the side. If vegetables are not listed on the menu, ask what is available.  Avoid: Vegetables prepared with cream,  butter, or cheese sauce. Fried vegetables. Salad Bars  Recommended: Many of the vegetables at a salad bar are considered "free." Use lemon juice, vinegar, or low-calorie salad dressing (fewer than 20 calories per serving) as "free" dressings for your salad. Look for salad bar ingredients that have no added fat or sugar such as tomatoes, lettuce, cucumbers, broccoli, carrots, onions, and mushrooms.  Avoid: Prepared salads with large amounts of dressing, such as coleslaw, caesar salad, macaroni salad, bean salad, or carrot salad. Fruit  Recommended: Eat fresh fruit or fresh fruit salad without added dressing. A salad bar often offers fresh fruit choices, but canned fruit at a restaurant is usually packed in sugar or syrup.  Avoid: Sweetened canned or frozen fruits, plain or sweetened fruit juice. Fruit salads with dressing, sour cream, or sugar added to them. Meat and Meat Substitutes  Recommended: Order broiled, baked, roasted, or grilled meat, poultry, or fish. Trim off all visible fat. Do not eat the skin of poultry. The size stated on the menu is the raw weight. Meat shrinks by  in cooking (for example, 4 oz raw equals 3 oz cooked meat).  Avoid: Deep-fat fried meat, poultry, or fish. Breaded meats. Eggs  Recommended: Order soft, hard-cooked, poached, or scrambled eggs. Omelets may be okay, depending on what ingredients are added. Egg substitutes are also a good choice.  Avoid: Fried eggs, eggs prepared with cream or cheese sauce. Milk  Recommended: Order low-fat or fat-free milk according to your meal plan. Plain, nonfat yogurt or flavored yogurt with no sugar added may be used as a substitute for milk. Soy milk may also be used.  Avoid: Milk shakes or sweetened milk beverages. Soups and Combination Foods  Recommended: Clear broth or consomm are "free" foods and may be used as an appetizer. Broth-based soups with fat removed count as a starch serving and are preferred over cream  soups. Soups made with beans or split peas may be eaten but count as a starch.  Avoid: Fatty soups, soup made with cream, cheese soup. Combination foods prepared with excessive amounts of fat or with cream or cheese sauces. Desserts and Sweets  Recommended: Ask for fresh fruit. Sponge or angel food cake without icing, ice milk, no sugar added ice cream, sherbet, or frozen yogurt may fit into your meal plan occasionally.  Avoid: Pastries, puddings, pies, cakes with icing, custard, gelatin desserts. Fats and Oils  Recommended: Choose healthy fats such as olive oil, canola oil, or tub margarine, reduced fat or fat-free sour cream, cream cheese, avocado, or nuts.  Avoid: Any fats in excess of your allowed portion. Deep-fried foods or any food with a large amount of fat. Note: Ask for all fats to be served on the side, and limit your portion sizes according to your meal plan. Document Released: 07/10/2005 Document Revised: 10/02/2011 Document Reviewed: 01/28/2009 [facility] Patient Information 2014 [place], [place]. Diabetes and Exercise Exercising regularly is important. It is not just about losing weight. It has many health benefits, such as:  Improving your overall fitness, flexibility, and endurance.  Increasing your bone density.  Helping with weight control.  Decreasing your body fat.  Increasing your  muscle strength.  Reducing stress and tension.  Improving your overall health. People with diabetes who exercise gain additional benefits because exercise:  Reduces appetite.  Improves the body's use of blood sugar (glucose).  Helps lower or control blood glucose.  Decreases blood pressure.  Helps control blood lipids (such as cholesterol and triglycerides).  Improves the body's use of the hormone insulin by:  Increasing the body's insulin sensitivity.  Reducing the body's insulin needs.  Decreases the risk for heart disease because exercising:  Lowers cholesterol and  triglycerides levels.  Increases the levels of good cholesterol (such as high-density lipoproteins [HDL]) in the body.  Lowers blood glucose levels. YOUR ACTIVITY PLAN  Choose an activity that you enjoy and set realistic goals. Your health care provider or diabetes educator can help you make an activity plan that works for you. You can break activities into 2 or 3 sessions throughout the day. Doing so is as good as one long session. Exercise ideas include:  Taking the dog for a walk.  Taking the stairs instead of the elevator.  Dancing to your favorite song.  Doing your favorite exercise with a friend. RECOMMENDATIONS FOR EXERCISING WITH TYPE 1 OR TYPE 2 DIABETES   Check your blood glucose before exercising. If blood glucose levels are greater than 240 mg/dL, check for urine ketones. Do not exercise if ketones are present.  Avoid injecting insulin into areas of the body that are going to be exercised. For example, avoid injecting insulin into:  The arms when playing tennis.  The legs when jogging.  Keep a record of:  Food intake before and after you exercise.  Expected peak times of insulin action.  Blood glucose levels before and after you exercise.  The type and amount of exercise you have done.  Review your records with your health care provider. Your health care provider will help you to develop guidelines for adjusting food intake and insulin amounts before and after exercising.  If you take insulin or oral hypoglycemic agents, watch for signs and symptoms of hypoglycemia. They include:  Dizziness.  Shaking.  Sweating.  Chills.  Confusion.  Drink plenty of water while you exercise to prevent dehydration or heat stroke. Body water is lost during exercise and must be replaced.  Talk to your health care provider before starting an exercise program to make sure it is safe for you. Remember, almost any type of activity is better than none. Document Released:  09/30/2003 Document Revised: 03/12/2013 Document Reviewed: 12/17/2012 [facility] Patient Information 2014 [place].

## 2013-08-26 LAB — INSULIN, FASTING: Insulin fasting, serum: 127 u[IU]/mL — ABNORMAL HIGH (ref 3–28)

## 2013-09-04 ENCOUNTER — Other Ambulatory Visit: Payer: Self-pay | Admitting: Internal Medicine

## 2013-09-22 ENCOUNTER — Other Ambulatory Visit: Payer: Self-pay | Admitting: Internal Medicine

## 2013-12-08 ENCOUNTER — Ambulatory Visit: Payer: Self-pay | Admitting: Emergency Medicine

## 2014-01-05 ENCOUNTER — Encounter: Payer: Self-pay | Admitting: Emergency Medicine

## 2014-01-05 ENCOUNTER — Other Ambulatory Visit: Payer: Self-pay | Admitting: Internal Medicine

## 2014-01-05 ENCOUNTER — Ambulatory Visit (INDEPENDENT_AMBULATORY_CARE_PROVIDER_SITE_OTHER): Payer: Managed Care, Other (non HMO) | Admitting: Emergency Medicine

## 2014-01-05 VITALS — BP 144/90 | HR 76 | Temp 98.4°F | Resp 18 | Ht 68.5 in | Wt 262.0 lb

## 2014-01-05 DIAGNOSIS — E119 Type 2 diabetes mellitus without complications: Secondary | ICD-10-CM

## 2014-01-05 DIAGNOSIS — Z125 Encounter for screening for malignant neoplasm of prostate: Secondary | ICD-10-CM

## 2014-01-05 DIAGNOSIS — I10 Essential (primary) hypertension: Secondary | ICD-10-CM

## 2014-01-05 DIAGNOSIS — Z23 Encounter for immunization: Secondary | ICD-10-CM

## 2014-01-05 DIAGNOSIS — E782 Mixed hyperlipidemia: Secondary | ICD-10-CM

## 2014-01-05 LAB — CBC WITH DIFFERENTIAL/PLATELET
Basophils Absolute: 0.1 10*3/uL (ref 0.0–0.1)
Basophils Relative: 1 % (ref 0–1)
Eosinophils Absolute: 0.2 10*3/uL (ref 0.0–0.7)
Eosinophils Relative: 2 % (ref 0–5)
HCT: 42.3 % (ref 39.0–52.0)
Hemoglobin: 14.6 g/dL (ref 13.0–17.0)
Lymphocytes Relative: 24 % (ref 12–46)
Lymphs Abs: 1.9 10*3/uL (ref 0.7–4.0)
MCH: 27.7 pg (ref 26.0–34.0)
MCHC: 34.5 g/dL (ref 30.0–36.0)
MCV: 80.1 fL (ref 78.0–100.0)
Monocytes Absolute: 0.7 10*3/uL (ref 0.1–1.0)
Monocytes Relative: 9 % (ref 3–12)
Neutro Abs: 5.2 10*3/uL (ref 1.7–7.7)
Neutrophils Relative %: 64 % (ref 43–77)
Platelets: 213 10*3/uL (ref 150–400)
RBC: 5.28 MIL/uL (ref 4.22–5.81)
RDW: 14.5 % (ref 11.5–15.5)
WBC: 8.1 10*3/uL (ref 4.0–10.5)

## 2014-01-05 LAB — HEMOGLOBIN A1C
Hgb A1c MFr Bld: 10 % — ABNORMAL HIGH (ref <5.7)
Mean Plasma Glucose: 240 mg/dL — ABNORMAL HIGH (ref <117)

## 2014-01-05 MED ORDER — DULAGLUTIDE 1.5 MG/0.5ML ~~LOC~~ SOAJ: SUBCUTANEOUS | Status: DC

## 2014-01-05 MED ORDER — PNEUMOCOCCAL 13-VAL CONJ VACC IM SUSP
0.5000 mL | Freq: Once | INTRAMUSCULAR | Status: AC
  Administered 2014-01-05: 0.5 mL via INTRAMUSCULAR

## 2014-01-05 NOTE — Progress Notes (Signed)
Subjective:    Patient ID: Phillip Powell, male    DOB: May 08, 1946, 68 y.o.   MRN: [id number]  HPI Comments: 68 yo WM  presents for 3 month F/U for HTN, Cholesterol, Pre-Dm, D. Deficient. BP has been good since starting medicine. He missed BP rx yesterday. He has not been eating healthy. He is not exercising except at work. BS has not been checking due machine issues. He checks feet routinely and denies skin break down or neuropathy increase.  WBC             7.9   08/25/2013 HGB            15.6   08/25/2013 HCT            45.4   08/25/2013 PLT             201   08/25/2013 GLUCOSE         199   08/25/2013 CHOL            161   08/25/2013 TRIG            187   08/25/2013 HDL              33   08/25/2013 LDLCALC          91   08/25/2013 ALT              23   08/25/2013 AST              18   08/25/2013 NA              137   08/25/2013 K               4.1   08/25/2013 CL               98   08/25/2013 CREATININE     0.96   08/25/2013 BUN              14   08/25/2013 CO2              29   08/25/2013 HGBA1C          8.4   08/25/2013    Hypertension  Diabetes      Medication List       This list is accurate as of: 01/05/14  9:23 AM.  Always use your most recent med list.               atenolol 100 MG tablet  Commonly known as:  TENORMIN  Take 100 mg by mouth daily.     glimepiride 4 MG tablet  Commonly known as:  AMARYL  Take 4 mg by mouth 2 (two) times daily.     hydrochlorothiazide 25 MG tablet  Commonly known as:  HYDRODIURIL  TAKE 1 TABLET EVERY MORNING FOR BLOOD PRESSURE     losartan 100 MG tablet  Commonly known as:  COZAAR     Magnesium 250 MG Tabs  Take by mouth 2 (two) times daily.     meloxicam 15 MG tablet  Commonly known as:  MOBIC     ranitidine 300 MG tablet  Commonly known as:  ZANTAC  Take 300 mg by mouth at bedtime.     sitaGLIPtin-metformin 50-500 MG per tablet  Commonly known as:  JANUMET  Take 1 tablet by mouth 2 (two) times daily with a meal.     TESTIM 50 MG/5GM  (1%) Gel  Generic drug:  testosterone  APPLY 2 TUBES ONCE DAILY AS DIRECTED     Vitamin D 2000 UNITS tablet  Take 5,000 Units by mouth daily.       Allergies  Allergen Reactions  . Ace Inhibitors   . Ibuprofen     GI upset  . Invokana [Canagliflozin]   . Metformin And Related     Diarrhea  . Quinidine    Past Medical History  Diagnosis Date  . Hypertension   . Diabetes mellitus without complication   . GERD (gastroesophageal reflux disease)   . Hypogonadism male   . OSA (obstructive sleep apnea)   . Vitamin D deficiency   . BPH (benign prostatic hypertrophy)      Review of Systems  All other systems reviewed and are negative.  BP 144/90  Pulse 76  Temp(Src) 98.4 F (36.9 C) (Temporal)  Resp 18  Ht 5' 8.5" (1.74 m)  Wt 262 lb (118.842 kg)  BMI 39.25 kg/m2     Objective:   Physical Exam  Nursing note and vitals reviewed. Constitutional: He is oriented to person, place, and time. He appears well-developed and well-nourished.  HENT:  Head: Normocephalic and atraumatic.  Right Ear: External ear normal.  Left Ear: External ear normal.  Nose: Nose normal.  Eyes: Conjunctivae and EOM are normal.  Neck: Normal range of motion. Neck supple. No JVD present. No thyromegaly present.  Cardiovascular: Normal rate, regular rhythm, normal heart sounds and intact distal pulses.   Pulmonary/Chest: Effort normal and breath sounds normal.  Abdominal: Soft. Bowel sounds are normal. He exhibits no distension and no mass. There is no tenderness. There is no rebound and no guarding.  Musculoskeletal: Normal range of motion. He exhibits no edema and no tenderness.  Lymphadenopathy:    He has no cervical adenopathy.  Neurological: He is alert and oriented to person, place, and time. He has normal reflexes. No cranial nerve deficit. Coordination normal.  Skin: Skin is warm and dry.  Psychiatric: He has a normal mood and affect. His behavior is normal. Judgment and thought content  normal.          Assessment & Plan:  1.  3 month F/U for HTN, Cholesterol,Non compliant DM, D. Deficient. Needs healthy diet, cardio QD and obtain healthy weight. 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 SX # 2 boxes ea given [employer] .75 q weekly x 2 weeks then increase to 1.5 mg weekly. Stop extra Januvia/ Amaryl.  2. Patient requested PSA check for insurance reimbursement

## 2014-01-05 NOTE — Patient Instructions (Addendum)
Diabetes and Foot Care Diabetes may cause you to have problems because of poor blood supply (circulation) to your feet and legs. This may cause the skin on your feet to become thinner, break easier, and heal more slowly. Your skin may become dry, and the skin may peel and crack. You may also have nerve damage in your legs and feet causing decreased feeling in them. You may not notice minor injuries to your feet that could lead to infections or more serious problems. Taking care of your feet is one of the most important things you can do for yourself.  HOME CARE INSTRUCTIONS  Wear shoes at all times, even in the house. Do not go barefoot. Bare feet are easily injured.  Check your feet daily for blisters, cuts, and redness. If you cannot see the bottom of your feet, use a mirror or ask someone for help.  Wash your feet with warm water (do not use hot water) and mild soap. Then pat your feet and the areas between your toes until they are completely dry. Do not soak your feet as this can dry your skin.  Apply a moisturizing lotion or petroleum jelly (that does not contain alcohol and is unscented) to the skin on your feet and to dry, brittle toenails. Do not apply lotion between your toes.  Trim your toenails straight across. Do not dig under them or around the cuticle. File the edges of your nails with an emery board or nail file.  Do not cut corns or calluses or try to remove them with medicine.  Wear clean socks or stockings every day. Make sure they are not too tight. Do not wear knee-high stockings since they may decrease blood flow to your legs.  Wear shoes that fit properly and have enough cushioning. To break in new shoes, wear them for just a few hours a day. This prevents you from injuring your feet. Always look in your shoes before you put them on to be sure there are no objects inside.  Do not cross your legs. This may decrease the blood flow to your feet.  If you find a minor scrape,  cut, or break in the skin on your feet, keep it and the skin around it clean and dry. These areas may be cleansed with mild soap and water. Do not cleanse the area with peroxide, alcohol, or iodine.  When you remove an adhesive bandage, be sure not to damage the skin around it.  If you have a wound, look at it several times a day to make sure it is healing.  Do not use heating pads or hot water bottles. They may burn your skin. If you have lost feeling in your feet or legs, you may not know it is happening until it is too late.  Make sure your health care provider performs a complete foot exam at least annually or more often if you have foot problems. Report any cuts, sores, or bruises to your health care provider immediately. SEEK MEDICAL CARE IF:   You have an injury that is not healing.  You have cuts or breaks in the skin.  You have an ingrown nail.  You notice redness on your legs or feet.  You feel burning or tingling in your legs or feet.  You have pain or cramps in your legs and feet.  Your legs or feet are numb.  Your feet always feel cold. SEEK IMMEDIATE MEDICAL CARE IF:   There is increasing redness,   swelling, or pain in or around a wound.  There is a red line that goes up your leg.  Pus is coming from a wound.  You develop a fever or as directed by your health care provider.  You notice a bad smell coming from an ulcer or wound. Document Released: 07/07/2000 Document Revised: 03/12/2013 Document Reviewed: 12/17/2012 [facility] Patient Information 2014 [place]. Pneumococcal Vaccine, Polyvalent suspension for injection What is this medicine? PNEUMOCOCCAL VACCINE, POLYVALENT (NEU mo KOK al vak SEEN, pol ee VEY luhnt) is a vaccine to prevent pneumococcus bacteria infection. These bacteria are a major cause of ear infections, 'Strep throat' infections, and serious pneumonia, meningitis, or blood infections worldwide. These vaccines help the body to produce  antibodies (protective substances) that help your body defend against these bacteria. This vaccine is recommended for infants and young children. This vaccine will not treat an infection. This medicine may be used for other purposes; ask your health care provider or pharmacist if you have questions. COMMON BRAND NAME(S): Prevnar 13 , Prevnar What should I tell my health care provider before I take this medicine? They need to know if you have any of these conditions: -bleeding problems -fever -immune system problems -low platelet count in the blood -seizures -an unusual or allergic reaction to pneumococcal vaccine, diphtheria toxoid, other vaccines, latex, other medicines, foods, dyes, or preservatives -pregnant or trying to get pregnant -breast-feeding How should I use this medicine? This vaccine is for injection into a muscle. It is given by a health care professional. A copy of Vaccine Information Statements will be given before each vaccination. Read this sheet carefully each time. The sheet may change frequently. Talk to your pediatrician regarding the use of this medicine in children. While this drug may be prescribed for children as young as 39 weeks old for selected conditions, precautions do apply. Overdosage: If you think you have taken too much of this medicine contact a poison control center or emergency room at once. NOTE: This medicine is only for you. Do not share this medicine with others. What if I miss a dose? It is important not to miss your dose. Call your doctor or health care professional if you are unable to keep an appointment. What may interact with this medicine? -medicines for cancer chemotherapy -medicines that suppress your immune function -medicines that treat or prevent blood clots like warfarin, enoxaparin, and dalteparin -steroid medicines like prednisone or cortisone This list may not describe all possible interactions. Give your health care provider a list  of all the medicines, herbs, non-prescription drugs, or dietary supplements you use. Also tell them if you smoke, drink alcohol, or use illegal drugs. Some items may interact with your medicine. What should I watch for while using this medicine? Mild fever and pain should go away in 3 days or less. Report any unusual symptoms to your doctor or health care professional. What side effects may I notice from receiving this medicine? Side effects that you should report to your doctor or health care professional as soon as possible: -allergic reactions like skin rash, itching or hives, swelling of the face, lips, or tongue -breathing problems -confused -fever over 102 degrees F -pain, tingling, numbness in the hands or feet -seizures -unusual bleeding or bruising -unusual muscle weakness Side effects that usually do not require medical attention (report to your doctor or health care professional if they continue or are bothersome): -aches and pains -diarrhea -fever of 102 degrees F or less -headache -irritable -loss of  appetite -pain, tender at site where injected -trouble sleeping This list may not describe all possible side effects. Call your doctor for medical advice about side effects. You may report side effects to FDA at 1-800-FDA-1088. Where should I keep my medicine? This does not apply. This vaccine is given in a clinic, pharmacy, doctor's office, or other health care setting and will not be stored at home. NOTE: This sheet is a summary. It may not cover all possible information. If you have questions about this medicine, talk to your doctor, pharmacist, or health care provider.  2014, [employer]. (2008-09-22 10:17:22)

## 2014-01-06 ENCOUNTER — Other Ambulatory Visit: Payer: Self-pay | Admitting: Emergency Medicine

## 2014-01-06 DIAGNOSIS — E119 Type 2 diabetes mellitus without complications: Secondary | ICD-10-CM

## 2014-01-06 DIAGNOSIS — Z9119 Patient's noncompliance with other medical treatment and regimen: Secondary | ICD-10-CM

## 2014-01-06 DIAGNOSIS — Z91199 Patient's noncompliance with other medical treatment and regimen due to unspecified reason: Secondary | ICD-10-CM

## 2014-01-06 LAB — BASIC METABOLIC PANEL WITH GFR
BUN: 17 mg/dL (ref 6–23)
CO2: 27 mEq/L (ref 19–32)
Calcium: 9.4 mg/dL (ref 8.4–10.5)
Chloride: 99 mEq/L (ref 96–112)
Creat: 0.99 mg/dL (ref 0.50–1.35)
GFR, Est African American: >89 mL/min
GFR, Est Non African American: 79 mL/min
Glucose, Bld: 225 mg/dL — ABNORMAL HIGH (ref 70–99)
Potassium: 4.2 mEq/L (ref 3.5–5.3)
Sodium: 134 mEq/L — ABNORMAL LOW (ref 135–145)

## 2014-01-06 LAB — LIPID PANEL
Cholesterol: 150 mg/dL (ref 0–200)
HDL: 33 mg/dL — ABNORMAL LOW (ref >39)
LDL Cholesterol: 85 mg/dL (ref 0–99)
Total CHOL/HDL Ratio: 4.5 Ratio
Triglycerides: 159 mg/dL — ABNORMAL HIGH (ref <150)
VLDL: 32 mg/dL (ref 0–40)

## 2014-01-06 LAB — HEPATIC FUNCTION PANEL
ALT: 26 U/L (ref 0–53)
AST: 29 U/L (ref 0–37)
Albumin: 3.9 g/dL (ref 3.5–5.2)
Alkaline Phosphatase: 57 U/L (ref 39–117)
Bilirubin, Direct: 0.1 mg/dL (ref 0.0–0.3)
Indirect Bilirubin: 0.4 mg/dL (ref 0.2–1.2)
Total Bilirubin: 0.5 mg/dL (ref 0.2–1.2)
Total Protein: 6.3 g/dL (ref 6.0–8.3)

## 2014-01-06 LAB — INSULIN, FASTING: Insulin fasting, serum: 26 u[IU]/mL (ref 3–28)

## 2014-01-06 LAB — PSA: PSA: 0.61 ng/mL (ref <=4.00)

## 2014-01-26 ENCOUNTER — Other Ambulatory Visit: Payer: Self-pay | Admitting: *Deleted

## 2014-01-26 MED ORDER — DULAGLUTIDE 1.5 MG/0.5ML ~~LOC~~ SOAJ: SUBCUTANEOUS | Status: DC

## 2014-01-29 ENCOUNTER — Other Ambulatory Visit: Payer: Self-pay | Admitting: Emergency Medicine

## 2014-01-29 DIAGNOSIS — R6889 Other general symptoms and signs: Secondary | ICD-10-CM

## 2014-02-02 ENCOUNTER — Other Ambulatory Visit: Payer: Managed Care, Other (non HMO)

## 2014-02-02 DIAGNOSIS — R6889 Other general symptoms and signs: Secondary | ICD-10-CM

## 2014-02-02 LAB — BASIC METABOLIC PANEL WITH GFR
BUN: 21 mg/dL (ref 6–23)
CO2: 29 mEq/L (ref 19–32)
Calcium: 9.2 mg/dL (ref 8.4–10.5)
Chloride: 98 mEq/L (ref 96–112)
Creat: 1.24 mg/dL (ref 0.50–1.35)
GFR, Est African American: 70 mL/min
GFR, Est Non African American: 60 mL/min
Glucose, Bld: 204 mg/dL — ABNORMAL HIGH (ref 70–99)
Potassium: 4.1 mEq/L (ref 3.5–5.3)
Sodium: 137 mEq/L (ref 135–145)

## 2014-02-03 ENCOUNTER — Other Ambulatory Visit: Payer: Self-pay | Admitting: Internal Medicine

## 2014-02-23 ENCOUNTER — Encounter: Payer: Self-pay | Admitting: Dietician

## 2014-02-23 ENCOUNTER — Ambulatory Visit: Payer: Managed Care, Other (non HMO) | Admitting: Dietician

## 2014-02-23 ENCOUNTER — Encounter: Payer: Managed Care, Other (non HMO) | Attending: Emergency Medicine | Admitting: Dietician

## 2014-02-23 VITALS — Ht 68.0 in | Wt 253.7 lb

## 2014-02-23 DIAGNOSIS — E119 Type 2 diabetes mellitus without complications: Secondary | ICD-10-CM | POA: Diagnosis present

## 2014-02-23 DIAGNOSIS — Z713 Dietary counseling and surveillance: Secondary | ICD-10-CM | POA: Insufficient documentation

## 2014-02-23 NOTE — Progress Notes (Signed)
Appt start time: 1100 end time:  1230.  Assessment:  Patient was seen on  02/23/2014 for individual diabetes education. Phillip Powell has had diabetes for 5-6 years. Testing blood sugar and averaging 73-170 mg/dl and around 115 mg/dl in the late afternoon. Has started regularly testing in the past 2 week. Wife is recording blood sugar since "he won't do it".   Works at night and sleeps during the day. Has a "screwy" schedule. Wife is at the appointment and states she is resistant to being at the appointment. States that the doctor has said that he "should avoid all starches". Made some changes to his diet in the past month. Was eating more chips, sandwiches, and cookies. States that his blood sugar has been lower and might have lost some weight. Has lost about 9-10 lbs since mid June.   Wife is doing the food preparation. Eating out about 3 x week. Eats about 2 meals per day but does snack. Is not exercising but does walk some for his job.   Has had 2 episodes of hypoglycemia recently (went down to 39 mg/dl) to night again. Has had a 59 mg/dl a month ago. Incidents have happened after having something sweet.   Patient Education Plan per assessed needs and concerns is to attend individual session for Diabetes Self Management Education.  Current HbA1c: 10.0% on 01/05/2014  Preferred Learning Style:   No preference indicated   Learning Readiness:   Contemplating   MEDICATIONS: glyburide, trulicity, Janumet  DIETARY INTAKE:  Usual eating pattern includes 2 meals and 1-2 snacks per day.  Avoided foods include: none    24-hr recall:  B (9:30 AM): bowl of cereal Honey Nut Cheerios with 2% milk  Snk ( AM): none  L ( PM): none Snk ( PM): none D (5-6PM): meat, vegetables, sometimes pototates Snk (2-3 AM): peanuts or meat roll up, grapes, or boiled egg Beverages: water, crystal, Diet Mountain Dew or Diet Coke  Usual physical activity: some walking with his job  Estimated energy needs: 2000  calories 225 g carbohydrates 150 g protein 56 g fat  Progress Towards Goal(s):  In progress.   Nutritional Diagnosis:  [place]-2.1 Inpaired nutrition utilization As related to glucose metabolism.  As evidenced by Hgb A1c of 10.0%.    Intervention:  Nutrition counseling provided.  Discussed diabetes disease process and treatment options.  Discussed physiology of diabetes and role of obesity on insulin resistance.  Encouraged moderate weight reduction to improve glucose levels.  Discussed role of medications and diet in glucose control  Provided education on macronutrients on glucose levels.  Provided education on carb counting, importance of regularly scheduled meals/snacks, and meal planning  Discussed effects of physical activity on glucose levels and long-term glucose control.  Recommended 150 minutes of physical activity/week.  Reviewed patient medications.  Discussed role of medication on blood glucose and possible side effects  Discussed blood glucose monitoring and interpretation.  Discussed recommended target ranges and individual ranges.    Described short-term complications: hyper- and hypo-glycemia.  Discussed causes,symptoms, and treatment options.  Discussed prevention, detection, and treatment of long-term complications.  Discussed the role of prolonged elevated glucose levels on body systems.  Discussed role of stress on blood glucose levels and discussed strategies to manage psychosocial issues.  Discussed recommendations for long-term diabetes self-care.  Established checklist for medical, dental, and emotional self-care.  Teaching Method Utilized:  Visual Auditory Hands on  Handouts given during visit include:  Living Well With Diabetes  Yellow Card  Diabetes Medications  Blood Glucose Monitoring  Barriers to learning/adherence to lifestyle change: works night shift, hesitant to make changes  Diabetes self-care support plan:   [facility] support  group  Wife  Demonstrated degree of understanding via:  Teach Back   Monitoring/Evaluation:  Dietary intake, exercise, and body weight in 2 month(s).

## 2014-02-23 NOTE — Patient Instructions (Signed)
Goals:  Follow Diabetes Meal Plan as instructed  Eat 3 meals and 2 snacks, every 3-5 hrs  Limit carbohydrate intake to 45-60 grams carbohydrate/meal  Limit carbohydrate intake to 15 grams carbohydrate/snack  Add lean protein foods to meals/snacks  Monitor glucose levels as instructed by your doctor  Aim for a goal 30 mins of physical activity daily (when you're ready)  Bring food record and glucose log to your next nutrition visit  [place]

## 2014-03-01 NOTE — Patient Instructions (Addendum)
Stop Glyburide  / Glipizide  / Glimepiride  Only use Trulicity til get on stricter / better diet  ++++++++++++++++++++++++++++++++++++++++++++++++++++++++++++++  Recommend the book "The END of DIETING" by Dr Baker Janus   and the book "The END of DIABETES " by Dr Excell Seltzer  At [email or web address] - get book & Audio CD's      Being diabetic has a  300% increased risk for heart attack, stroke, cancer, and alzheimer- type vascular dementia. It is very important that you work harder with diet by avoiding all foods that are white except chicken & fish. Avoid white rice (brown & wild rice is OK), white potatoes (sweetpotatoes in moderation is OK), White bread or wheat bread or anything made out of white flour like bagels, donuts, rolls, buns, biscuits, cakes, pastries, cookies, pizza crust, and pasta (made from white flour & egg whites) - vegetarian pasta or spinach or wheat pasta is OK. Multigrain breads like Arnold's or Pepperidge Farm, or multigrain sandwich thins or flatbreads.  Diet, exercise and weight loss can reverse and cure diabetes in the early stages.  Diet, exercise and weight loss is very important in the control and prevention of complications of diabetes which affects every system in your body, ie. Brain - dementia/stroke, eyes - glaucoma/blindness, heart - heart attack/heart failure, kidneys - dialysis, stomach - gastric paralysis, intestines - malabsorption, nerves - severe painful neuritis, circulation - gangrene & loss of a leg(s), and finally cancer and Alzheimers.    I recommend avoid fried & greasy foods,  sweets/candy, white rice (brown or wild rice or Quinoa is OK), white potatoes (sweet potatoes are OK) - anything made from white flour - bagels, doughnuts, rolls, buns, biscuits,white and wheat breads, pizza crust and traditional pasta made of white flour & egg white(vegetarian pasta or spinach or wheat pasta is OK).  Multi-grain bread is OK - like multi-grain flat bread or sandwich thins.  Avoid alcohol in excess. Exercise is also important.    Eat all the vegetables you want - avoid meat, especially red meat and dairy - especially cheese.  Cheese is the most concentrated form of trans-fats which is the worst thing to clog up our arteries. Veggie cheese is OK which can be found in the fresh produce section at [facility] or [employer] or [employer]

## 2014-03-01 NOTE — Progress Notes (Signed)
Patient ID: Phillip Powell, male   DOB: 08-24-1945, 68 y.o.   MRN: [id number]   Annual Screening Comprehensive Examination  This very nice 68 y.o.MWM presents for complete physical.  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   HTN predates since 1984. Patient's BP has been controlled at home.Today's BP: 128/82 mmHg. Patient denies any cardiac symptoms as chest pain, palpitations, shortness of breath, dizziness or ankle swelling.   Patient's hyperlipidemia is controlled with diet and medications. Patient denies myalgias or other medication SE's. 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 Patient denies reactive hypoglycemic symptoms, visual blurring, diabetic polys or paresthesias. Last A1c was  10.0% on 01/05/2014. Reports CBG's running 0000000 since on Trulicity. 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    Patient is on Testosterone Gel and admits improved Stamina and sense of well being. Finally, patient has history of Vitamin D Deficiency of 37 in 2008 and last vitamin D was 61 on 06/23/2013:  Medication Sig  . atenolol (TENORMIN) 100 MG tablet TAKE 1 TABLET BY MOUTH DAILY  . Cholecalciferol (VITAMIN D) 2000 UNITS tablet Take 5,000 Units by mouth daily.   . Dulaglutide (TRULICITY) 1.5 0000000 SOPN Inject 1 pen/ syringe Q week  . glyBURIDE (DIABETA) 5 MG tablet TAKE 1/2-1 TABLET THREE TIMES DAILY AFTER MEALS  . hydrochlorothiazide (HYDRODIURIL) 25 MG tablet TAKE 1 TABLET EVERY MORNING FOR BLOOD PRESSURE  . losartan (COZAAR) 100 MG tablet TAKE 1 TABLET EVERY DAY  . Magnesium 250 MG TABS Take by mouth 2 (two)  times daily.  . meloxicam (MOBIC) 15 MG tablet   . ranitidine (ZANTAC) 300 MG capsule TAKE ONE CAPSULE BY MOUTH EVERY DAY  . ranitidine (ZANTAC) 300 MG tablet Take 300 mg by mouth at bedtime.  . sitaGLIPtin-metformin (JANUMET) 50-500 MG per tablet Take 1 tablet by mouth 2 (two) times daily with a meal.  . TESTIM 50 MG/5GM GEL APPLY 2 TUBES ONCE DAILY AS DIRECTED    Allergies  Allergen Reactions  . Ace Inhibitors   . Ibuprofen     GI upset  . Invokana [Canagliflozin]   . Metformin And Related     Diarrhea  . Quinidine    Past Medical History  Diagnosis Date  . Hypertension   . Diabetes mellitus without complication   . GERD (gastroesophageal reflux disease)   . Hypogonadism male   . OSA (obstructive sleep apnea)   . Vitamin D deficiency   . BPH (benign prostatic hypertrophy)    Past Surgical History  Procedure Laterality Date  . Tonsillectomy  1952  . Rotator cuff repair Left 1996  . Rotator cuff repair Right 1997  . Hernia repair  [id number]    umbilical   Family History  Problem Relation Age of Onset  . Cancer Mother     throat  . Diabetes Mother   . Thyroid disease Father   . Cancer Father     lung  . Thyroid disease Sister   . Hypertension Son   . Hyperlipidemia Son   . Cancer Paternal Grandmother     colon   History   Social  History  . Marital Status: Married    Spouse Name: N/A    Number of Children: N/A  . Years of Education: N/A   Occupational History  . Not on file.   Social History Main Topics  . Smoking status: Never Smoker   . Smokeless tobacco: Not on file  . Alcohol Use: Yes     Comment: very rarely  . Drug Use: Not on file  . Sexual Activity: Not on file    ROS Constitutional: Denies fever, chills, weight loss/gain, headaches, insomnia, fatigue, night sweats or change in appetite. Eyes: Denies redness, blurred vision, diplopia, discharge, itchy or watery eyes.  ENT: Denies discharge, congestion, post nasal drip, epistaxis, sore throat,  earache, hearing loss, dental pain, Tinnitus, Vertigo, Sinus pain or snoring.  Cardio: Denies chest pain, palpitations, irregular heartbeat, syncope, dyspnea, diaphoresis, orthopnea, PND, claudication or edema Respiratory: denies cough, dyspnea, DOE, pleurisy, hoarseness, laryngitis or wheezing.  Gastrointestinal: Denies dysphagia, heartburn, reflux, water brash, pain, cramps, nausea, vomiting, bloating, diarrhea, constipation, hematemesis, melena, hematochezia, jaundice or hemorrhoids Genitourinary: Denies dysuria, frequency, urgency, nocturia, hesitancy, discharge, hematuria or flank pain Musculoskeletal: Denies arthralgia, myalgia, stiffness, Jt. Swelling, pain, limp or strain/sprain. Denies Falls. Skin: Denies puritis, rash, hives, warts, acne, eczema or change in skin lesion Neuro: No weakness, tremor, incoordination, spasms, paresthesia or pain Psychiatric: Denies confusion, memory loss or sensory loss. Denies Depression. Endocrine: Denies change in weight, skin, hair change, nocturia, and paresthesia, diabetic polys, visual blurring or hyper / hypo glycemic episodes.  Heme/Lymph: No excessive bleeding, bruising or enlarged lymph nodes.  Physical Exam  BP 128/82  Pulse 80  Temp(Src) 97.7 F (36.5 C) (Temporal)  Resp 16  Ht 5' 8.5" (1.74 m)  Wt 246 lb 9.6 oz (111.857 kg)  BMI 36.95 kg/m2  General Appearance: Over nourished Endomorph and in no apparent distress. Eyes: PERRLA, EOMs, conjunctiva no swelling or erythema, normal fundi and vessels. Sinuses: No frontal/maxillary tenderness ENT/Mouth: EACs patent / TMs  nl. Nares clear without erythema, swelling, mucoid exudates. Oral hygiene is good. No erythema, swelling, or exudate. Tongue normal, non-obstructing. Tonsils not swollen or erythematous. Hearing normal.  Neck: Supple, thyroid normal. No bruits, nodes or JVD. Respiratory: Respiratory effort normal.  BS equal and clear bilateral without rales, rhonci, wheezing or  stridor. Cardio: Heart sounds are normal with regular rate and rhythm and no murmurs, rubs or gallops. Peripheral pulses are normal and equal bilaterally without edema. No aortic or femoral bruits. Chest: symmetric with normal excursions and percussion.  Abdomen: Flat, soft, with bowl sounds. Nontender, no guarding, rebound, hernias, masses, or organomegaly.  Lymphatics: Non tender without lymphadenopathy.  Genitourinary: No hernias.Testes nl. DRE - prostate nl for age - smooth & firm w/o nodules. Musculoskeletal: Full ROM all peripheral extremities, joint stability, 5/5 strength, and normal gait. Skin: Warm and dry without rashes, lesions, cyanosis, clubbing or  ecchymosis.  Neuro: Cranial nerves intact, reflexes equal bilaterally. Normal muscle tone, no cerebellar symptoms. Sensation intact.  Pysch: Awake and oriented X 3with normal affect, insight and judgment appropriate.  Assessment and Plan  1. Annual Screening Examination 2. Hypertension  3. Hyperlipidemia 4. 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 Vitamin D Deficiency 6. Morbid Obesity 7. Testosterone Deficiency  Continue prudent diet as discussed, weight control, BP monitoring, regular exercise, and medications as discussed.  Discussed med effects and SE's. Routine screening labs and tests as requested with regular follow-up as recommended.  Patient was given #4 Trulicity pens with recommendations to increase MF-er adding an extra tablet  and using Loperamide as needed. ROV 6 weeks to reassess control.

## 2014-03-02 ENCOUNTER — Other Ambulatory Visit: Payer: Self-pay | Admitting: Internal Medicine

## 2014-03-02 ENCOUNTER — Ambulatory Visit (INDEPENDENT_AMBULATORY_CARE_PROVIDER_SITE_OTHER): Payer: Managed Care, Other (non HMO) | Admitting: Internal Medicine

## 2014-03-02 ENCOUNTER — Encounter: Payer: Self-pay | Admitting: Internal Medicine

## 2014-03-02 VITALS — BP 128/82 | HR 80 | Temp 97.7°F | Resp 16 | Ht 68.5 in | Wt 246.6 lb

## 2014-03-02 DIAGNOSIS — I150 Renovascular hypertension: Secondary | ICD-10-CM

## 2014-03-02 DIAGNOSIS — Z1212 Encounter for screening for malignant neoplasm of rectum: Secondary | ICD-10-CM

## 2014-03-02 DIAGNOSIS — E559 Vitamin D deficiency, unspecified: Secondary | ICD-10-CM

## 2014-03-02 DIAGNOSIS — Z6841 Body Mass Index (BMI) 40.0 and over, adult: Secondary | ICD-10-CM

## 2014-03-02 DIAGNOSIS — E66811 Obesity, class 1: Secondary | ICD-10-CM | POA: Insufficient documentation

## 2014-03-02 DIAGNOSIS — E1129 Type 2 diabetes mellitus with other diabetic kidney complication: Secondary | ICD-10-CM

## 2014-03-02 DIAGNOSIS — R7402 Elevation of levels of lactic acid dehydrogenase (LDH): Secondary | ICD-10-CM

## 2014-03-02 DIAGNOSIS — R7401 Elevation of levels of liver transaminase levels: Secondary | ICD-10-CM

## 2014-03-02 DIAGNOSIS — Z113 Encounter for screening for infections with a predominantly sexual mode of transmission: Secondary | ICD-10-CM

## 2014-03-02 DIAGNOSIS — E669 Obesity, unspecified: Secondary | ICD-10-CM | POA: Insufficient documentation

## 2014-03-02 DIAGNOSIS — I152 Hypertension secondary to endocrine disorders: Secondary | ICD-10-CM

## 2014-03-02 DIAGNOSIS — Z125 Encounter for screening for malignant neoplasm of prostate: Secondary | ICD-10-CM

## 2014-03-02 DIAGNOSIS — Z79899 Other long term (current) drug therapy: Secondary | ICD-10-CM

## 2014-03-02 DIAGNOSIS — I158 Other secondary hypertension: Secondary | ICD-10-CM

## 2014-03-02 DIAGNOSIS — R740 Nonspecific elevation of levels of transaminase and lactic acid dehydrogenase [LDH]: Secondary | ICD-10-CM

## 2014-03-02 DIAGNOSIS — Z0000 Encounter for general adult medical examination without abnormal findings: Secondary | ICD-10-CM

## 2014-03-02 LAB — MAGNESIUM: Magnesium: 2 mg/dL (ref 1.5–2.5)

## 2014-03-02 LAB — TSH: TSH: 1.472 u[IU]/mL (ref 0.350–4.500)

## 2014-03-02 LAB — CBC WITH DIFFERENTIAL/PLATELET
Basophils Absolute: 0 10*3/uL (ref 0.0–0.1)
Basophils Relative: 0 % (ref 0–1)
Eosinophils Absolute: 0.1 10*3/uL (ref 0.0–0.7)
Eosinophils Relative: 1 % (ref 0–5)
HCT: 45.3 % (ref 39.0–52.0)
Hemoglobin: 15.6 g/dL (ref 13.0–17.0)
Lymphocytes Relative: 21 % (ref 12–46)
Lymphs Abs: 2.1 10*3/uL (ref 0.7–4.0)
MCH: 27.6 pg (ref 26.0–34.0)
MCHC: 34.4 g/dL (ref 30.0–36.0)
MCV: 80 fL (ref 78.0–100.0)
Monocytes Absolute: 0.7 10*3/uL (ref 0.1–1.0)
Monocytes Relative: 7 % (ref 3–12)
Neutro Abs: 7 10*3/uL (ref 1.7–7.7)
Neutrophils Relative %: 71 % (ref 43–77)
Platelets: 227 10*3/uL (ref 150–400)
RBC: 5.66 MIL/uL (ref 4.22–5.81)
RDW: 14.8 % (ref 11.5–15.5)
WBC: 9.8 10*3/uL (ref 4.0–10.5)

## 2014-03-02 LAB — BASIC METABOLIC PANEL WITH GFR
BUN: 22 mg/dL (ref 6–23)
CO2: 28 mEq/L (ref 19–32)
Calcium: 9.7 mg/dL (ref 8.4–10.5)
Chloride: 97 mEq/L (ref 96–112)
Creat: 1.21 mg/dL (ref 0.50–1.35)
GFR, Est African American: 72 mL/min
GFR, Est Non African American: 62 mL/min
Glucose, Bld: 125 mg/dL — ABNORMAL HIGH (ref 70–99)
Potassium: 4.6 mEq/L (ref 3.5–5.3)
Sodium: 136 mEq/L (ref 135–145)

## 2014-03-02 LAB — LIPID PANEL
Cholesterol: 162 mg/dL (ref 0–200)
HDL: 32 mg/dL — ABNORMAL LOW (ref >39)
LDL Cholesterol: 102 mg/dL — ABNORMAL HIGH (ref 0–99)
Total CHOL/HDL Ratio: 5.1 Ratio
Triglycerides: 141 mg/dL (ref <150)
VLDL: 28 mg/dL (ref 0–40)

## 2014-03-02 LAB — HEPATIC FUNCTION PANEL
ALT: 22 U/L (ref 0–53)
AST: 19 U/L (ref 0–37)
Albumin: 4.3 g/dL (ref 3.5–5.2)
Alkaline Phosphatase: 64 U/L (ref 39–117)
Bilirubin, Direct: 0.2 mg/dL (ref 0.0–0.3)
Indirect Bilirubin: 0.7 mg/dL (ref 0.2–1.2)
Total Bilirubin: 0.9 mg/dL (ref 0.2–1.2)
Total Protein: 6.7 g/dL (ref 6.0–8.3)

## 2014-03-02 LAB — VITAMIN B12: Vitamin B-12: 418 pg/mL (ref 211–911)

## 2014-03-02 LAB — HEMOGLOBIN A1C
Hgb A1c MFr Bld: 7.4 % — ABNORMAL HIGH (ref <5.7)
Mean Plasma Glucose: 166 mg/dL — ABNORMAL HIGH (ref <117)

## 2014-03-02 LAB — RPR

## 2014-03-02 MED ORDER — METFORMIN HCL ER 500 MG PO TB24: ORAL_TABLET | ORAL | Status: DC

## 2014-03-03 LAB — HEPATITIS C ANTIBODY: HCV Ab: NEGATIVE

## 2014-03-03 LAB — URINALYSIS, MICROSCOPIC ONLY
Bacteria, UA: NONE SEEN
Casts: NONE SEEN
Crystals: NONE SEEN
Squamous Epithelial / LPF: NONE SEEN

## 2014-03-03 LAB — HEPATITIS B SURFACE ANTIBODY,QUALITATIVE: Hep B S Ab: NEGATIVE

## 2014-03-03 LAB — MICROALBUMIN / CREATININE URINE RATIO
Creatinine, Urine: 165.8 mg/dL
Microalb Creat Ratio: 3.9 mg/g (ref 0.0–30.0)
Microalb, Ur: 0.65 mg/dL (ref 0.00–1.89)

## 2014-03-03 LAB — TESTOSTERONE: Testosterone: 247 ng/dL — ABNORMAL LOW (ref 300–890)

## 2014-03-03 LAB — HEPATITIS B CORE ANTIBODY, TOTAL: Hep B Core Total Ab: NONREACTIVE

## 2014-03-03 LAB — VITAMIN D 25 HYDROXY (VIT D DEFICIENCY, FRACTURES): Vit D, 25-Hydroxy: 50 ng/mL (ref 30–89)

## 2014-03-03 LAB — HEPATITIS A ANTIBODY, TOTAL: Hep A Total Ab: NONREACTIVE

## 2014-03-03 LAB — INSULIN, FASTING: Insulin fasting, serum: 37 u[IU]/mL — ABNORMAL HIGH (ref 3–28)

## 2014-03-03 LAB — HIV ANTIBODY (ROUTINE TESTING W REFLEX): HIV 1&2 Ab, 4th Generation: NONREACTIVE

## 2014-03-04 LAB — HEPATITIS B E ANTIBODY: Hepatitis Be Antibody: NONREACTIVE

## 2014-03-16 ENCOUNTER — Other Ambulatory Visit: Payer: Self-pay | Admitting: Internal Medicine

## 2014-04-13 ENCOUNTER — Encounter: Payer: Self-pay | Admitting: Internal Medicine

## 2014-04-13 ENCOUNTER — Ambulatory Visit (INDEPENDENT_AMBULATORY_CARE_PROVIDER_SITE_OTHER): Payer: Managed Care, Other (non HMO) | Admitting: Internal Medicine

## 2014-04-13 VITALS — BP 124/80 | HR 68 | Temp 98.1°F | Resp 16 | Ht 68.5 in | Wt 248.0 lb

## 2014-04-13 DIAGNOSIS — E1129 Type 2 diabetes mellitus with other diabetic kidney complication: Secondary | ICD-10-CM

## 2014-04-13 DIAGNOSIS — I10 Essential (primary) hypertension: Secondary | ICD-10-CM

## 2014-04-13 LAB — HEMOGLOBIN A1C
Hgb A1c MFr Bld: 7 % — ABNORMAL HIGH (ref <5.7)
Mean Plasma Glucose: 154 mg/dL — ABNORMAL HIGH (ref <117)

## 2014-04-13 NOTE — Progress Notes (Signed)
Subjective:    Patient ID: Phillip Powell, male    DOB: 05/23/1946, 68 y.o.   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 He relates he never used the Trulicity samples and his CBG's range 80-100 and occas has a random CBG of 150-155. He denies any diabetic polys, paresthesias or visual blurring. Meds otherwise unchanged altho patient has gained 3# in the last 6 weeks. 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   Medication List   atenolol 100 MG tablet  Commonly known as:  TENORMIN  TAKE 1 TABLET BY MOUTH DAILY     glyBURIDE 5 MG tablet  Commonly known as:  DIABETA  TAKE 1/2-1 TABLET THREE TIMES DAILY AFTER MEALS     hydrochlorothiazide 25 MG tablet  Commonly known as:  HYDRODIURIL  TAKE 1 TABLET EVERY MORNING FOR BLOOD PRESSURE     losartan 100 MG tablet  Commonly known as:  COZAAR  TAKE 1 TABLET EVERY DAY     Magnesium 250 MG Tabs  Take by mouth 2 (two) times daily.     meloxicam 15 MG tablet  Commonly known as:  MOBIC  TAKE 1 TABLET BY MOUTH DAILY WITH FOOD     ranitidine 300 MG tablet  Commonly known as:  ZANTAC  Take 300 mg by mouth at bedtime.     sitaGLIPtin-metformin 50-500 MG per tablet  Commonly known as:  JANUMET  Take 1 tablet by mouth 2 (two) times daily with a meal.     TESTIM 50 MG/5GM (1%) Gel  Generic drug:  testosterone  APPLY 2 TUBES ONCE DAILY AS DIRECTED     Vitamin D 2000 UNITS tablet  Take 5,000 Units by mouth daily.     Allergies  Allergen Reactions  . Ace Inhibitors   . Ibuprofen     GI upset  . Invokana [Canagliflozin]   . Metformin And Related     Diarrhea  . Quinidine    Past Medical History  Diagnosis Date  . Hypertension   . Diabetes mellitus without complication   . GERD (gastroesophageal reflux disease)   . Hypogonadism male   . OSA  (obstructive sleep apnea)   . Vitamin D deficiency   . BPH (benign prostatic hypertrophy)    Review of Systems  12 point systems Review Neg except as above  Objective:   Physical Exam  BP 124/80  Pulse 68  Temp 98.1 F  Resp 16  Ht 5' 8.5"   Wt 248 lb   BMI 37.16   HEENT - Eac's patent. TM's Nl. EOM's full. PERRLA. NasoOroPharynx clear. Neck - supple. Nl Thyroid. Carotids 2+ & No bruits, nodes, JVD Chest - Clear equal BS w/o Rales, rhonchi, wheezes. Cor - Nl HS. RRR w/o sig MGR. PP 1(+). No edema. Abd - Rotund & benign. BS nl. MS- FROM w/o deformities. Muscle power, tone and bulk Nl. Gait Nl. Neuro - No obvious Cr N abnormalities. Sensory, motor and Cerebellar functions appear Nl w/o focal abnormalities. Psyche - Mental status normal & appropriate.  No delusions, ideations or obvious mood abnormalities.  Assessment & Plan:   1. Essential hypertension  2. 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 Also advised try cut Glyburide back to 1/2 tab bid and ultimately d/c for the risk of Hypoglycemia and the evidenced weight gain.

## 2014-04-13 NOTE — Patient Instructions (Signed)
   Take Imodium (Loperamide)  2 mg  - 2-4 tabs 3 x day  +++++++++++++++++++++++++++++++++++++++++++  Try cut back glyburide to 1/2 tab 23 x day and   Ultimately hope to stop as it causes you to gain weight !

## 2014-04-15 ENCOUNTER — Other Ambulatory Visit: Payer: Self-pay | Admitting: Internal Medicine

## 2014-04-15 ENCOUNTER — Other Ambulatory Visit: Payer: Self-pay | Admitting: Emergency Medicine

## 2014-04-17 ENCOUNTER — Other Ambulatory Visit: Payer: Self-pay | Admitting: Internal Medicine

## 2014-04-27 ENCOUNTER — Ambulatory Visit: Payer: Managed Care, Other (non HMO) | Admitting: Dietician

## 2014-06-29 ENCOUNTER — Ambulatory Visit (INDEPENDENT_AMBULATORY_CARE_PROVIDER_SITE_OTHER): Payer: Managed Care, Other (non HMO) | Admitting: Physician Assistant

## 2014-06-29 ENCOUNTER — Encounter: Payer: Self-pay | Admitting: Physician Assistant

## 2014-06-29 VITALS — BP 140/80 | HR 72 | Temp 98.1°F | Resp 16 | Ht 68.5 in | Wt 251.0 lb

## 2014-06-29 DIAGNOSIS — N183 Chronic kidney disease, stage 3 unspecified: Secondary | ICD-10-CM

## 2014-06-29 DIAGNOSIS — K2100 Gastro-esophageal reflux disease with esophagitis, without bleeding: Secondary | ICD-10-CM

## 2014-06-29 DIAGNOSIS — Z23 Encounter for immunization: Secondary | ICD-10-CM

## 2014-06-29 DIAGNOSIS — E559 Vitamin D deficiency, unspecified: Secondary | ICD-10-CM

## 2014-06-29 DIAGNOSIS — Z6841 Body Mass Index (BMI) 40.0 and over, adult: Secondary | ICD-10-CM

## 2014-06-29 DIAGNOSIS — I10 Essential (primary) hypertension: Secondary | ICD-10-CM

## 2014-06-29 DIAGNOSIS — Z79899 Other long term (current) drug therapy: Secondary | ICD-10-CM

## 2014-06-29 DIAGNOSIS — E291 Testicular hypofunction: Secondary | ICD-10-CM

## 2014-06-29 DIAGNOSIS — E782 Mixed hyperlipidemia: Secondary | ICD-10-CM

## 2014-06-29 DIAGNOSIS — E1122 Type 2 diabetes mellitus with diabetic chronic kidney disease: Secondary | ICD-10-CM

## 2014-06-29 DIAGNOSIS — G4733 Obstructive sleep apnea (adult) (pediatric): Secondary | ICD-10-CM

## 2014-06-29 LAB — CBC WITH DIFFERENTIAL/PLATELET
Basophils Absolute: 0 10*3/uL (ref 0.0–0.1)
Basophils Relative: 0 % (ref 0–1)
Eosinophils Absolute: 0.2 10*3/uL (ref 0.0–0.7)
Eosinophils Relative: 2 % (ref 0–5)
HCT: 44.9 % (ref 39.0–52.0)
Hemoglobin: 15.2 g/dL (ref 13.0–17.0)
Lymphocytes Relative: 26 % (ref 12–46)
Lymphs Abs: 2.4 10*3/uL (ref 0.7–4.0)
MCH: 27.5 pg (ref 26.0–34.0)
MCHC: 33.9 g/dL (ref 30.0–36.0)
MCV: 81.3 fL (ref 78.0–100.0)
MPV: 9.4 fL (ref 9.4–12.4)
Monocytes Absolute: 0.6 10*3/uL (ref 0.1–1.0)
Monocytes Relative: 7 % (ref 3–12)
Neutro Abs: 5.9 10*3/uL (ref 1.7–7.7)
Neutrophils Relative %: 65 % (ref 43–77)
Platelets: 209 10*3/uL (ref 150–400)
RBC: 5.52 MIL/uL (ref 4.22–5.81)
RDW: 14.8 % (ref 11.5–15.5)
WBC: 9.1 10*3/uL (ref 4.0–10.5)

## 2014-06-29 LAB — HEMOGLOBIN A1C
Hgb A1c MFr Bld: 7.6 % — ABNORMAL HIGH (ref <5.7)
Mean Plasma Glucose: 171 mg/dL — ABNORMAL HIGH (ref <117)

## 2014-06-29 MED ORDER — DULAGLUTIDE 1.5 MG/0.5ML ~~LOC~~ SOAJ: SUBCUTANEOUS | Status: DC

## 2014-06-29 NOTE — Patient Instructions (Addendum)
Start back on trulicity Add 81 mg Asprin daily or every other day     Bad carbs also include fruit juice, alcohol, and sweet tea. These are empty calories that do not signal to your brain that you are full.   Please remember the good carbs are still carbs which convert into sugar. So please measure them out no more than 1/2-1 cup of rice, oatmeal, pasta, and beans  Veggies are however free foods! Pile them on.   Not all fruit is created equal. Please see the list below, the fruit at the bottom is higher in sugars than the fruit at the top. Please avoid all dried fruits.    Please stop the glyburide (generic). This medication forces your blood sugar down no matter what it is starting at. This can cause diabetics to have to eat more to keep their blood sugar elevated which goes against what our goals are for you. Only take 1/2 of the medication if your sugar is above 120 and you can take a whole pill if your sugar is above 150 in the morning. If at any time you start to have low blood sugars in the morning or during the day please stop this medication. Please never take this medication if you are sick or can not eat. A low blood sugar is much more dangerous than a high blood sugar. Your brain needs two things, sugar and oxygen.

## 2014-06-29 NOTE — Progress Notes (Signed)
Assessment and Plan:  Hypertension: Continue medication, monitor blood pressure at home. Continue DASH diet.  Reminder to go to the ER if any CP, SOB, nausea, dizziness, severe HA, changes vision/speech, left arm numbness and tingling and jaw pain. Cholesterol: Continue diet and exercise. Check cholesterol.  Diabetes with diabetic chronic kidney disease-Continue diet and exercise. Check A1C, will add back on trulicity, add bASA, discussed stopping/decreasing meloxicam due to CKD Vitamin D Def- check level and continue medications.  Obesity with co morbidities- long discussion about weight loss, diet, and exercise, will get sleep study  Continue diet and meds as discussed. Further disposition pending results of labs. Discussed med's effects and SE's.  OVER 40 minutes of exam, counseling, chart review, referral performed   HPI 68 y.o. W male  presents for 3 month follow up with hypertension, hyperlipidemia, diabetes and vitamin D. His blood pressure has been controlled at home, today their BP is BP: 140/80 mmHg He does not workout. He denies chest pain, shortness of breath, dizziness.  He is not on cholesterol medication and denies myalgias. His cholesterol is not at goal. The cholesterol was:  03/02/2014: Cholesterol, Total 162; HDL Cholesterol by NMR 32*; LDL (calc) 102*; Triglycerides 141 He has been working on diet and exercise for Diabetes with diabetic chronic kidney disease, he is not on bASA, he is on ACE/ARB, he is on Janumet 50/500 once daily and glyburide once daily 3 times a day, he was on trulicity and had weight loss with A1C going from 10-->7 and him and his wife would like to get back on it, and denies paresthesia of the feet, polydipsia, polyuria and visual disturbances. Last A1C was: 04/13/2014: Hemoglobin-A1c 7.0* Patient is on Vitamin D supplement. 03/02/2014: Vit D, 25-Hydroxy 50  BMI is Body mass index is 37.61 kg/(m^2)., he is working on diet and exercise, he has not been  diagnosed with OSA, not on CPAP. He has + snoring with wife having witnessed apneas, fatigue.  Wt Readings from Last 3 Encounters:  06/29/14 251 lb (113.853 kg)  04/13/14 248 lb (112.492 kg)  03/02/14 246 lb 9.6 oz (111.857 kg)   He has a history of testosterone deficiency and is on testosterone replacement, testim 1% gel. He states that the testosterone helps with his energy, libido, muscle mass. Lab Results  Component Value Date   TESTOSTERONE 247* 03/02/2014   Takes meloxicam due to right hand pain/OA. May get surgery in 1-2 months.   Current Medications:  Current Outpatient Prescriptions on File Prior to Visit  Medication Sig Dispense Refill  . atenolol (TENORMIN) 100 MG tablet TAKE 1 TABLET BY MOUTH DAILY 90 tablet 2  . Cholecalciferol (VITAMIN D) 2000 UNITS tablet Take 5,000 Units by mouth daily.     Marland Kitchen glyBURIDE (DIABETA) 5 MG tablet TAKE 1/2-1 TABLET THREE TIMES DAILY AFTER MEALS 270 tablet 1  . hydrochlorothiazide (HYDRODIURIL) 25 MG tablet TAKE 1 TABLET EVERY MORNING FOR BLOOD PRESSURE 90 tablet 3  . JANUMET 50-500 MG per tablet TAKE 1 TABLET BY MOUTH 2 (TWO) TIMES DAILY WITH A MEAL. 60 tablet 3  . losartan (COZAAR) 100 MG tablet TAKE 1 TABLET EVERY DAY 90 tablet 2  . Magnesium 250 MG TABS Take by mouth 2 (two) times daily.    . meloxicam (MOBIC) 15 MG tablet TAKE 1 TABLET BY MOUTH DAILY WITH FOOD 90 tablet 8  . ranitidine (ZANTAC) 300 MG tablet Take 300 mg by mouth at bedtime.    . TESTIM 50 MG/5GM (1%) GEL APPLY  2 TUBES ONCE DAILY AS DIRECTED 300 g 5   No current facility-administered medications on file prior to visit.   Medical History:  Past Medical History  Diagnosis Date  . Hypertension   . Diabetes mellitus without complication   . GERD (gastroesophageal reflux disease)   . Hypogonadism male   . OSA (obstructive sleep apnea)   . Vitamin D deficiency   . BPH (benign prostatic hypertrophy)    Allergies:  Allergies  Allergen Reactions  . Ace Inhibitors   .  Ibuprofen     GI upset  . Invokana [Canagliflozin]   . Metformin And Related     Diarrhea  . Quinidine      Review of Systems:  Review of Systems  Constitutional: Negative.   HENT: Negative.   Eyes: Negative.   Respiratory: Negative.   Cardiovascular: Negative.   Gastrointestinal: Negative.   Genitourinary: Negative.   Musculoskeletal: Positive for joint pain (right hand). Negative for myalgias, back pain and neck pain.  Skin: Negative.   Neurological: Negative.   Endo/Heme/Allergies: Negative.   Psychiatric/Behavioral: Negative for suicidal ideas, hallucinations and substance abuse. The patient has insomnia (due to snoring). The patient is not nervous/anxious.     Family history- Review and unchanged Social history- Review and unchanged Physical Exam: BP 140/80 mmHg  Pulse 72  Temp(Src) 98.1 F (36.7 C)  Resp 16  Ht 5' 8.5" (1.74 m)  Wt 251 lb (113.853 kg)  BMI 37.61 kg/m2 Wt Readings from Last 3 Encounters:  06/29/14 251 lb (113.853 kg)  04/13/14 248 lb (112.492 kg)  03/02/14 246 lb 9.6 oz (111.857 kg)   General Appearance: Well nourished, in no apparent distress. Eyes: PERRLA, EOMs, conjunctiva no swelling or erythema Sinuses: No Frontal/maxillary tenderness ENT/Mouth: Ext aud canals clear, TMs without erythema, bulging. No erythema, swelling, or exudate on post pharynx.  Tonsils not swollen or erythematous. Hearing normal. Crowded mouth  Neck: Supple, thyroid normal, large neck circumference Respiratory: Respiratory effort normal, BS equal bilaterally without rales, rhonchi, wheezing or stridor.  Cardio: RRR with no MRGs. Brisk peripheral pulses without edema.  Abdomen: Soft, + BS,obese  Non tender, no guarding, rebound, hernias, masses. Lymphatics: Non tender without lymphadenopathy.  Musculoskeletal: Full ROM, 5/5 strength, normal gait.  Skin: Warm, dry without rashes, lesions, ecchymosis.  Neuro: Cranial nerves intact. No cerebellar symptoms. Sensation  intact.  Psych: Awake and oriented X 3, normal affect, Insight and Judgment appropriate.    Vicie Mutters, PA-C 8:47 AM [facility]

## 2014-06-30 LAB — LIPID PANEL
Cholesterol: 156 mg/dL (ref 0–200)
HDL: 36 mg/dL — ABNORMAL LOW (ref >39)
LDL Cholesterol: 92 mg/dL (ref 0–99)
Total CHOL/HDL Ratio: 4.3 Ratio
Triglycerides: 140 mg/dL (ref <150)
VLDL: 28 mg/dL (ref 0–40)

## 2014-06-30 LAB — BASIC METABOLIC PANEL WITH GFR
BUN: 17 mg/dL (ref 6–23)
CO2: 27 mEq/L (ref 19–32)
Calcium: 9.6 mg/dL (ref 8.4–10.5)
Chloride: 100 mEq/L (ref 96–112)
Creat: 1.07 mg/dL (ref 0.50–1.35)
GFR, Est African American: 83 mL/min
GFR, Est Non African American: 71 mL/min
Glucose, Bld: 194 mg/dL — ABNORMAL HIGH (ref 70–99)
Potassium: 4.2 mEq/L (ref 3.5–5.3)
Sodium: 137 mEq/L (ref 135–145)

## 2014-06-30 LAB — HEPATIC FUNCTION PANEL
ALT: 15 U/L (ref 0–53)
AST: 14 U/L (ref 0–37)
Albumin: 4.3 g/dL (ref 3.5–5.2)
Alkaline Phosphatase: 53 U/L (ref 39–117)
Bilirubin, Direct: 0.1 mg/dL (ref 0.0–0.3)
Indirect Bilirubin: 0.4 mg/dL (ref 0.2–1.2)
Total Bilirubin: 0.5 mg/dL (ref 0.2–1.2)
Total Protein: 6.6 g/dL (ref 6.0–8.3)

## 2014-06-30 LAB — TESTOSTERONE: Testosterone: 274 ng/dL — ABNORMAL LOW (ref 300–890)

## 2014-06-30 LAB — INSULIN, FASTING: Insulin fasting, serum: 26.2 u[IU]/mL — ABNORMAL HIGH (ref 2.0–19.6)

## 2014-06-30 LAB — TSH: TSH: 1.283 u[IU]/mL (ref 0.350–4.500)

## 2014-06-30 LAB — VITAMIN D 25 HYDROXY (VIT D DEFICIENCY, FRACTURES): Vit D, 25-Hydroxy: 29 ng/mL — ABNORMAL LOW (ref 30–100)

## 2014-06-30 LAB — MAGNESIUM: Magnesium: 1.6 mg/dL (ref 1.5–2.5)

## 2014-07-03 ENCOUNTER — Emergency Department (HOSPITAL_COMMUNITY)
Admission: EM | Admit: 2014-07-03 | Discharge: 2014-07-03 | Disposition: A | Discharge status: Home / Self Care | Payer: Managed Care, Other (non HMO) | Attending: Emergency Medicine | Admitting: Emergency Medicine

## 2014-07-03 ENCOUNTER — Encounter (HOSPITAL_COMMUNITY): Payer: Self-pay | Admitting: Emergency Medicine

## 2014-07-03 DIAGNOSIS — M79604 Pain in right leg: Secondary | ICD-10-CM

## 2014-07-03 DIAGNOSIS — Z87438 Personal history of other diseases of male genital organs: Secondary | ICD-10-CM | POA: Diagnosis not present

## 2014-07-03 DIAGNOSIS — E119 Type 2 diabetes mellitus without complications: Secondary | ICD-10-CM | POA: Diagnosis not present

## 2014-07-03 DIAGNOSIS — I10 Essential (primary) hypertension: Secondary | ICD-10-CM | POA: Insufficient documentation

## 2014-07-03 DIAGNOSIS — Z6841 Body Mass Index (BMI) 40.0 and over, adult: Secondary | ICD-10-CM

## 2014-07-03 DIAGNOSIS — G4733 Obstructive sleep apnea (adult) (pediatric): Secondary | ICD-10-CM | POA: Insufficient documentation

## 2014-07-03 DIAGNOSIS — Z79899 Other long term (current) drug therapy: Secondary | ICD-10-CM | POA: Diagnosis not present

## 2014-07-03 DIAGNOSIS — E559 Vitamin D deficiency, unspecified: Secondary | ICD-10-CM | POA: Diagnosis not present

## 2014-07-03 DIAGNOSIS — K219 Gastro-esophageal reflux disease without esophagitis: Secondary | ICD-10-CM | POA: Diagnosis not present

## 2014-07-03 DIAGNOSIS — M79609 Pain in unspecified limb: Secondary | ICD-10-CM

## 2014-07-03 DIAGNOSIS — Z791 Long term (current) use of non-steroidal anti-inflammatories (NSAID): Secondary | ICD-10-CM | POA: Insufficient documentation

## 2014-07-03 LAB — CBC WITH DIFFERENTIAL/PLATELET
Basophils Absolute: 0 10*3/uL (ref 0.0–0.1)
Basophils Relative: 0 % (ref 0–1)
Eosinophils Absolute: 0.1 10*3/uL (ref 0.0–0.7)
Eosinophils Relative: 1 % (ref 0–5)
HCT: 45.7 % (ref 39.0–52.0)
Hemoglobin: 15.9 g/dL (ref 13.0–17.0)
Lymphocytes Relative: 18 % (ref 12–46)
Lymphs Abs: 2 10*3/uL (ref 0.7–4.0)
MCH: 27.8 pg (ref 26.0–34.0)
MCHC: 34.8 g/dL (ref 30.0–36.0)
MCV: 79.9 fL (ref 78.0–100.0)
Monocytes Absolute: 0.6 10*3/uL (ref 0.1–1.0)
Monocytes Relative: 5 % (ref 3–12)
Neutro Abs: 8.3 10*3/uL — ABNORMAL HIGH (ref 1.7–7.7)
Neutrophils Relative %: 76 % (ref 43–77)
Platelets: 220 10*3/uL (ref 150–400)
RBC: 5.72 MIL/uL (ref 4.22–5.81)
RDW: 13.6 % (ref 11.5–15.5)
WBC: 10.9 10*3/uL — ABNORMAL HIGH (ref 4.0–10.5)

## 2014-07-03 LAB — COMPREHENSIVE METABOLIC PANEL
ALT: 18 U/L (ref 0–53)
AST: 18 U/L (ref 0–37)
Albumin: 4.3 g/dL (ref 3.5–5.2)
Alkaline Phosphatase: 66 U/L (ref 39–117)
Anion gap: 17 — ABNORMAL HIGH (ref 5–15)
BUN: 23 mg/dL (ref 6–23)
CO2: 25 mEq/L (ref 19–32)
Calcium: 10.3 mg/dL (ref 8.4–10.5)
Chloride: 95 mEq/L — ABNORMAL LOW (ref 96–112)
Creatinine, Ser: 1 mg/dL (ref 0.50–1.35)
GFR calc Af Amer: 88 mL/min — ABNORMAL LOW (ref >90)
GFR calc non Af Amer: 76 mL/min — ABNORMAL LOW (ref >90)
Glucose, Bld: 232 mg/dL — ABNORMAL HIGH (ref 70–99)
Potassium: 4.5 mEq/L (ref 3.7–5.3)
Sodium: 137 mEq/L (ref 137–147)
Total Bilirubin: 0.6 mg/dL (ref 0.3–1.2)
Total Protein: 7.8 g/dL (ref 6.0–8.3)

## 2014-07-03 LAB — CBG MONITORING, ED: Glucose-Capillary: 202 mg/dL — ABNORMAL HIGH (ref 70–99)

## 2014-07-03 LAB — CK: Total CK: 150 U/L (ref 7–232)

## 2014-07-03 LAB — I-STAT CG4 LACTIC ACID, ED: Lactic Acid, Venous: 2.14 mmol/L (ref 0.5–2.2)

## 2014-07-03 MED ORDER — MORPHINE SULFATE 4 MG/ML IJ SOLN
4.0000 mg | Freq: Once | INTRAMUSCULAR | Status: AC
  Administered 2014-07-03: 4 mg via INTRAVENOUS
  Filled 2014-07-03: qty 1

## 2014-07-03 MED ORDER — DIAZEPAM 5 MG/ML IJ SOLN
5.0000 mg | Freq: Once | INTRAMUSCULAR | Status: AC
  Administered 2014-07-03: 5 mg via INTRAVENOUS
  Filled 2014-07-03: qty 2

## 2014-07-03 MED ORDER — OXYCODONE-ACETAMINOPHEN 5-325 MG PO TABS
2.0000 | ORAL_TABLET | Freq: Once | ORAL | Status: AC
  Administered 2014-07-03: 2 via ORAL
  Filled 2014-07-03: qty 2

## 2014-07-03 MED ORDER — OXYCODONE-ACETAMINOPHEN 5-325 MG PO TABS: 1.0000 | ORAL_TABLET | Freq: Four times a day (QID) | ORAL | Status: DC | PRN

## 2014-07-03 MED ORDER — SODIUM CHLORIDE 0.9 % IV BOLUS (SEPSIS)
1000.0000 mL | Freq: Once | INTRAVENOUS | Status: AC
  Administered 2014-07-03: 1000 mL via INTRAVENOUS

## 2014-07-03 MED ORDER — DIAZEPAM 5 MG PO TABS: 5.0000 mg | ORAL_TABLET | Freq: Four times a day (QID) | ORAL | Status: DC | PRN

## 2014-07-03 MED ORDER — HYDROMORPHONE HCL 1 MG/ML IJ SOLN
1.0000 mg | Freq: Once | INTRAMUSCULAR | Status: AC
  Administered 2014-07-03: 1 mg via INTRAVENOUS
  Filled 2014-07-03: qty 1

## 2014-07-03 NOTE — Progress Notes (Signed)
Right lower extremity venous duplex completed.  Right:  No evidence of DVT, superficial thrombosis, or Baker's cyst.  Left:  Negative for DVT in the common femoral vein.  

## 2014-07-03 NOTE — ED Notes (Addendum)
Ultrasound Bedside.

## 2014-07-03 NOTE — ED Notes (Signed)
Pt c/o pain in rigth leg that is behind the knee and travels around the calf.  Pt is truck driver at night.  Pt sent from PCP to rule out DVT.  Pt is Diabetic.

## 2014-07-03 NOTE — ED Provider Notes (Signed)
CSN: [id number]     Arrival date & time 07/03/14  [id number] History   First MD Initiated Contact with Patient 07/03/14 1009     Chief Complaint  Patient presents with  . sent from PCP ?DVT   . Leg Pain     (Consider location/radiation/quality/duration/timing/severity/associated sxs/prior Treatment) The history is provided by the patient.  Phillip Powell is a 68 y.o. male hx of HTN, DM, GERD here presenting with right calf and leg pain. 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 Acute onset of right leg pain since this morning. States it's behind his knee and calf. Denies any chest pain or shortness of breath. Denies any history of DVT or PE. Patient has history of diabetes and blood sugar has been running in the 100s. Denies any fevers or chills or abdominal pain.    Past Medical History  Diagnosis Date  . Hypertension   . Diabetes mellitus without complication   . GERD (gastroesophageal reflux disease)   . Hypogonadism male   . OSA (obstructive sleep apnea)   . Vitamin D deficiency   . BPH (benign prostatic hypertrophy)    Past Surgical History  Procedure Laterality Date  . Tonsillectomy  1952  . Rotator cuff repair Left 1996  . Rotator cuff repair Right 1997  . Hernia repair  [id number]    umbilical   Family History  Problem Relation Age of Onset  . Cancer Mother     throat  . Diabetes Mother   . Thyroid disease Father   . Cancer Father     lung  . Thyroid disease Sister   . Hypertension Son   . Hyperlipidemia Son   . Cancer Paternal Grandmother     colon   History  Substance Use Topics  . Smoking status: Never Smoker   . Smokeless tobacco: Not on file  . Alcohol Use: Yes     Comment: very rarely    Review of Systems  Musculoskeletal:       R leg pain   All other systems reviewed and are negative.     Allergies  Ace inhibitors; Ibuprofen; Invokana; Metformin and related; and Quinidine  Home Medications   Prior to Admission medications    Medication Sig Start Date End Date Taking? Authorizing Provider  atenolol (TENORMIN) 100 MG tablet TAKE 1 TABLET BY MOUTH DAILY   Yes Unk Pinto, MD  Cholecalciferol (VITAMIN D) 2000 UNITS tablet Take 5,000 Units by mouth daily.    Yes Historical Provider, MD  Dulaglutide (TRULICITY) 1.5 0000000 SOPN Inject 1 pen SQ once weekly 06/29/14  Yes Vicie Mutters, PA-C  glyBURIDE (DIABETA) 5 MG tablet TAKE 1/2-1 TABLET THREE TIMES DAILY AFTER MEALS   Yes Vicie Mutters, PA-C  hydrochlorothiazide (HYDRODIURIL) 25 MG tablet TAKE 1 TABLET EVERY MORNING FOR BLOOD PRESSURE 09/22/13  Yes Unk Pinto, MD  JANUMET 50-500 MG per tablet TAKE 1 TABLET BY MOUTH 2 (TWO) TIMES DAILY WITH A MEAL. 04/16/14  Yes Unk Pinto, MD  losartan (COZAAR) 100 MG tablet TAKE 1 TABLET EVERY DAY   Yes Unk Pinto, MD  Magnesium 250 MG TABS Take by mouth 2 (two) times daily.   Yes Historical Provider, MD  meloxicam (MOBIC) 15 MG tablet TAKE 1 TABLET BY MOUTH DAILY WITH FOOD 04/16/14  Yes Vicie Mutters, PA-C  ranitidine (ZANTAC) 300 MG tablet Take 300 mg by mouth at bedtime.   Yes Historical Provider, MD  TESTIM 50 MG/5GM (1%) GEL APPLY  2 TUBES ONCE DAILY AS DIRECTED 04/17/14  Yes Unk Pinto, MD   BP 161/85 mmHg  Pulse 87  Temp(Src) 97.9 F (36.6 C) (Oral)  Resp 20  SpO2 92% Physical Exam  Constitutional: He is oriented to person, place, and time.  Uncomfortable   HENT:  Head: Normocephalic.  Eyes: Pupils are equal, round, and reactive to light.  Neck: Normal range of motion.  Cardiovascular: Normal rate, regular rhythm and normal heart sounds.   Pulmonary/Chest: Effort normal and breath sounds normal. No respiratory distress. He has no wheezes.  Abdominal: Soft.  Musculoskeletal:  R calf slightly swollen and tender. 2+ pulses. Mild swelling R thigh. Neurovascular intact   Neurological: He is alert and oriented to person, place, and time.  Skin: Skin is warm and dry.  Psychiatric: He has a normal mood  and affect. His behavior is normal. Judgment and thought content normal.  Nursing note and vitals reviewed.   ED Course  Procedures (including critical care time) Labs Review Labs Reviewed  CBC WITH DIFFERENTIAL - Abnormal; Notable for the following:    WBC 10.9 (*)    Neutro Abs 8.3 (*)    All other components within normal limits  COMPREHENSIVE METABOLIC PANEL - Abnormal; Notable for the following:    Chloride 95 (*)    Glucose, Bld 232 (*)    GFR calc non Af Amer 76 (*)    GFR calc Af Amer 88 (*)    Anion gap 17 (*)    All other components within normal limits  CBG MONITORING, ED - Abnormal; Notable for the following:    Glucose-Capillary 202 (*)    All other components within normal limits  CK  I-STAT CG4 LACTIC ACID, ED    Imaging Review No results found.   EKG Interpretation None      MDM   Final diagnoses:  Morbid obesity with BMI of 40.0-44.9, adult   Phillip Powell is a 68 y.o. male here with R leg and calf pain. Will get [place] to r/o DVT. No injury to the leg so I doubt fracture or compartment syndrome.   2:42 PM US showed no DVT. I got labs since he has persistent pain. Labs showed hyperglycemia. Given IVF and pain meds and valium and felt better. Lactate and CK nl. Multiple re examination. Great femoral, popliteal and dorsalis pulses. Compartments soft. No signs of sciatica. No abdominal pain or chest pain to suggestion dissection. Will d/c home with pain meds, muscle relaxants.   Wandra Arthurs, MD 07/03/14 [phone]

## 2014-07-03 NOTE — ED Notes (Signed)
Patient verbalizes minimal relief from pain medication.

## 2014-07-03 NOTE — Discharge Instructions (Signed)
Take percocet for severe pain.   Take valium for muscle spasms.   Do NOT drive when you take percocet, valium.   Follow up with your doctor or ortho.   Return to ER if you have severe pain, spasms, unable to walk, cold leg, swollen leg.

## 2014-07-28 ENCOUNTER — Telehealth: Payer: Self-pay | Admitting: Internal Medicine

## 2014-07-28 NOTE — Telephone Encounter (Signed)
Patient returned your call for referral appt information.  [id number]   Thank you, Katrina Judeth Horn [facility] [phone] Fax [phone]

## 2014-08-06 ENCOUNTER — Encounter: Payer: Self-pay | Admitting: Physician Assistant

## 2014-08-06 ENCOUNTER — Telehealth: Payer: Self-pay

## 2014-08-06 NOTE — Telephone Encounter (Signed)
[facility] received order but states sleep study was not sent with order. Refaxed sleep study to [facility]. Patient's spouse aware that orders were sent. Advised spouse to call back if she was not contacted by [facility] this afternoon. Patient states she would not be home today so if she was not contacted then she would call tomorrow morning.

## 2014-08-18 ENCOUNTER — Encounter: Payer: Self-pay | Admitting: Internal Medicine

## 2014-10-05 ENCOUNTER — Ambulatory Visit (INDEPENDENT_AMBULATORY_CARE_PROVIDER_SITE_OTHER): Payer: Managed Care, Other (non HMO) | Admitting: Internal Medicine

## 2014-10-05 ENCOUNTER — Encounter: Payer: Self-pay | Admitting: Internal Medicine

## 2014-10-05 VITALS — BP 134/78 | HR 88 | Temp 97.5°F | Resp 16 | Ht 68.5 in | Wt 250.8 lb

## 2014-10-05 DIAGNOSIS — N183 Chronic kidney disease, stage 3 unspecified: Secondary | ICD-10-CM

## 2014-10-05 DIAGNOSIS — E291 Testicular hypofunction: Secondary | ICD-10-CM

## 2014-10-05 DIAGNOSIS — E782 Mixed hyperlipidemia: Secondary | ICD-10-CM

## 2014-10-05 DIAGNOSIS — M1000 Idiopathic gout, unspecified site: Secondary | ICD-10-CM

## 2014-10-05 DIAGNOSIS — Z6841 Body Mass Index (BMI) 40.0 and over, adult: Secondary | ICD-10-CM

## 2014-10-05 DIAGNOSIS — M109 Gout, unspecified: Secondary | ICD-10-CM

## 2014-10-05 DIAGNOSIS — I10 Essential (primary) hypertension: Secondary | ICD-10-CM

## 2014-10-05 DIAGNOSIS — E559 Vitamin D deficiency, unspecified: Secondary | ICD-10-CM

## 2014-10-05 DIAGNOSIS — E1122 Type 2 diabetes mellitus with diabetic chronic kidney disease: Secondary | ICD-10-CM

## 2014-10-05 DIAGNOSIS — K219 Gastro-esophageal reflux disease without esophagitis: Secondary | ICD-10-CM

## 2014-10-05 DIAGNOSIS — Z91199 Patient's noncompliance with other medical treatment and regimen due to unspecified reason: Secondary | ICD-10-CM | POA: Insufficient documentation

## 2014-10-05 DIAGNOSIS — Z9119 Patient's noncompliance with other medical treatment and regimen: Secondary | ICD-10-CM | POA: Insufficient documentation

## 2014-10-05 DIAGNOSIS — Z79899 Other long term (current) drug therapy: Secondary | ICD-10-CM

## 2014-10-05 LAB — CBC WITH DIFFERENTIAL/PLATELET
Basophils Absolute: 0 10*3/uL (ref 0.0–0.1)
Basophils Relative: 0 % (ref 0–1)
Eosinophils Absolute: 0.1 10*3/uL (ref 0.0–0.7)
Eosinophils Relative: 1 % (ref 0–5)
HCT: 41.8 % (ref 39.0–52.0)
Hemoglobin: 14.3 g/dL (ref 13.0–17.0)
Lymphocytes Relative: 20 % (ref 12–46)
Lymphs Abs: 2.2 10*3/uL (ref 0.7–4.0)
MCH: 27.8 pg (ref 26.0–34.0)
MCHC: 34.2 g/dL (ref 30.0–36.0)
MCV: 81.3 fL (ref 78.0–100.0)
MPV: 8.9 fL (ref 8.6–12.4)
Monocytes Absolute: 0.9 10*3/uL (ref 0.1–1.0)
Monocytes Relative: 8 % (ref 3–12)
Neutro Abs: 8 10*3/uL — ABNORMAL HIGH (ref 1.7–7.7)
Neutrophils Relative %: 71 % (ref 43–77)
Platelets: 185 10*3/uL (ref 150–400)
RBC: 5.14 MIL/uL (ref 4.22–5.81)
RDW: 15.1 % (ref 11.5–15.5)
WBC: 11.2 10*3/uL — ABNORMAL HIGH (ref 4.0–10.5)

## 2014-10-05 LAB — LIPID PANEL
Cholesterol: 142 mg/dL (ref 0–200)
HDL: 41 mg/dL (ref >=40)
LDL Cholesterol: 74 mg/dL (ref 0–99)
Total CHOL/HDL Ratio: 3.5 Ratio
Triglycerides: 135 mg/dL (ref <150)
VLDL: 27 mg/dL (ref 0–40)

## 2014-10-05 LAB — BASIC METABOLIC PANEL WITH GFR
BUN: 21 mg/dL (ref 6–23)
CO2: 25 mEq/L (ref 19–32)
Calcium: 9.2 mg/dL (ref 8.4–10.5)
Chloride: 98 mEq/L (ref 96–112)
Creat: 1.08 mg/dL (ref 0.50–1.35)
GFR, Est African American: 82 mL/min
GFR, Est Non African American: 71 mL/min
Glucose, Bld: 153 mg/dL — ABNORMAL HIGH (ref 70–99)
Potassium: 4 mEq/L (ref 3.5–5.3)
Sodium: 137 mEq/L (ref 135–145)

## 2014-10-05 LAB — HEPATIC FUNCTION PANEL
ALT: 16 U/L (ref 0–53)
AST: 11 U/L (ref 0–37)
Albumin: 3.8 g/dL (ref 3.5–5.2)
Alkaline Phosphatase: 52 U/L (ref 39–117)
Bilirubin, Direct: 0.2 mg/dL (ref 0.0–0.3)
Indirect Bilirubin: 0.6 mg/dL (ref 0.2–1.2)
Total Bilirubin: 0.8 mg/dL (ref 0.2–1.2)
Total Protein: 6.2 g/dL (ref 6.0–8.3)

## 2014-10-05 LAB — URIC ACID: Uric Acid, Serum: 6.3 mg/dL (ref 4.0–7.8)

## 2014-10-05 LAB — HEMOGLOBIN A1C
Hgb A1c MFr Bld: 7.3 % — ABNORMAL HIGH (ref <5.7)
Mean Plasma Glucose: 163 mg/dL — ABNORMAL HIGH (ref <117)

## 2014-10-05 LAB — TSH: TSH: 1.873 u[IU]/mL (ref 0.350–4.500)

## 2014-10-05 LAB — MAGNESIUM: Magnesium: 1.7 mg/dL (ref 1.5–2.5)

## 2014-10-05 MED ORDER — TESTOSTERONE CYPIONATE 200 MG/ML IM SOLN: 400.0000 mg | INTRAMUSCULAR | Status: DC

## 2014-10-05 NOTE — Patient Instructions (Signed)

## 2014-10-05 NOTE — Progress Notes (Signed)
Patient ID: Phillip Powell, male   DOB: 22-Feb-1946, 69 y.o.   MRN: [id number]   This very nice 69 y.o. MWM presents for 3 month follow up with Hypertension, Hyperlipidemia, T2_NIDDM w/ CKD3and Vitamin D Deficiency. Patient is also s/p Bilat Carpal tunnel rpr by Dr Amedeo Plenty and 1 wk s/p the left and is c/o 68 +/- hx/o swelling of the left hand and wrist. He's scheduled to see Dr Amedeo Plenty in the next 30-60 minutes following his appt here.    Patient is treated for HTN since 1984 & BP has been controlled at home. Today's BP: 134/78 mmHg. Patient has had no complaints of any cardiac type chest pain, palpitations, dyspnea/orthopnea/PND, dizziness, claudication, or dependent edema.   Hyperlipidemia is controlled with diet & meds. Patient denies myalgias or other med SE's. Last Lipids were at goal Total Chol 156; HDL 36; LDL  92; Trig 140 on 06/29/2014.   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 Patient has been very poorly diet compliant according to his wife today and patient has had no symptoms of reactive hypoglycemia, diabetic polys, paresthesias or visual blurring.  Last A1c was  7.6% on  06/29/2014.   Further, the patient also has history of Vitamin D Deficiency of 37 in 2008 and supplements vitamin D without any suspected side-effects. Last vitamin D was  29 on  06/29/2014.  Medication Sig  . atenolol (TENORMIN) 100 MG tablet TAKE 1 TABLET BY MOUTH DAILY  . Cholecalciferol (VITAMIN D) 2000 UNITS tablet Take 5,000 Units by mouth daily.   . diazepam (VALIUM) 5 MG tablet Take 1 tablet (5 mg total) by mouth every 6 (six) hours as needed for anxiety (spasms).  . Dulaglutide (TRULICITY) 1.5 0000000 SOPN Inject 1 pen SQ once weekly  . glyBURIDE (DIABETA) 5 MG tablet TAKE 1/2-1 TABLET THREE TIMES DAILY AFTER MEALS  . hydrochlorothiazide (HYDRODIURIL) 25 MG tablet TAKE 1 TABLET EVERY MORNING FOR BLOOD PRESSURE  . JANUMET 50-500 MG per tablet TAKE  1 TABLET BY MOUTH 2 (TWO) TIMES DAILY WITH A MEAL.  Marland Kitchen losartan (COZAAR) 100 MG tablet TAKE 1 TABLET EVERY DAY  . Magnesium 250 MG TABS Take by mouth 2 (two) times daily.  . meloxicam (MOBIC) 15 MG tablet TAKE 1 TABLET BY MOUTH DAILY WITH FOOD  . oxyCODONE-acetaminophen (PERCOCET) 5-325 MG per tablet Take 1 tablet by mouth every 6 (six) hours as needed.  . ranitidine (ZANTAC) 300 MG tablet Take 300 mg by mouth at bedtime.  . TESTIM 50 MG/5GM (1%) GEL APPLY 2 TUBES ONCE DAILY AS DIRECTED   Allergies  Allergen Reactions  . Ace Inhibitors   . Ibuprofen     GI upset  . Invokana [Canagliflozin]   . Metformin And Related     Diarrhea  . Quinidine    PMHx:   Past Medical History  Diagnosis Date  . Hypertension   . Diabetes mellitus without complication   . GERD (gastroesophageal reflux disease)   . Hypogonadism male   . OSA (obstructive sleep apnea)   . Vitamin D deficiency   . BPH (benign prostatic hypertrophy)    Immunization History  Administered Date(s) Administered  . Influenza Split 06/23/2013  . Influenza, High Dose Seasonal PF 06/29/2014  . Pneumococcal Conjugate-13 01/05/2014  . Pneumococcal-Unspecified 07/24/1994  . Td 06/23/2002  . Zoster 10/23/2010   Past Surgical History  Procedure Laterality Date  . Tonsillectomy  1952  . Rotator cuff repair  Left 1996  . Rotator cuff repair Right 1997  . Hernia repair  [id number]    umbilical   FHx:    Reviewed / unchanged  SHx:    Reviewed / unchanged  Systems Review:  Constitutional: Denies fever, chills, wt changes, headaches, insomnia, fatigue, night sweats, change in appetite. Eyes: Denies redness, blurred vision, diplopia, discharge, itchy, watery eyes.  ENT: Denies discharge, congestion, post nasal drip, epistaxis, sore throat, earache, hearing loss, dental pain, tinnitus, vertigo, sinus pain, snoring.  CV: Denies chest pain, palpitations, irregular heartbeat, syncope, dyspnea, diaphoresis, orthopnea, PND, claudication or  edema. Respiratory: denies cough, dyspnea, DOE, pleurisy, hoarseness, laryngitis, wheezing.  Gastrointestinal: Denies dysphagia, odynophagia, heartburn, reflux, water brash, abdominal pain or cramps, nausea, vomiting, bloating, diarrhea, constipation, hematemesis, melena, hematochezia  or hemorrhoids. Genitourinary: Denies dysuria, frequency, urgency, nocturia, hesitancy, discharge, hematuria or flank pain. Musculoskeletal: Denies arthralgias, myalgias, stiffness, jt. swelling, pain, limping or strain/sprain.  Skin: Denies pruritus, rash, hives, warts, acne, eczema or change in skin lesion(s). Neuro: No weakness, tremor, incoordination, spasms, paresthesia or pain. Psychiatric: Denies confusion, memory loss or sensory loss. Endo: Denies change in weight, skin or hair change.  Heme/Lymph: No excessive bleeding, bruising or enlarged lymph nodes.  Physical Exam  BP 134/78 mmHg  Pulse 88  Temp(Src) 97.5 F (36.4 C)  Resp 16  Ht 5' 8.5" (1.74 m)  Wt 250 lb 12.8 oz (113.762 kg)  BMI 37.57   Appears well nourished and in no distress. Eyes: PERRLA, EOMs, conjunctiva no swelling or erythema. Sinuses: No frontal/maxillary tenderness ENT/Mouth: EAC's clear, TM's nl w/o erythema, bulging. Nares clear w/o erythema, swelling, exudates. Oropharynx clear without erythema or exudates. Oral hygiene is good. Tongue normal, non obstructing. Hearing intact.  Neck: Supple. Thyroid nl. Car 2+/2+ without bruits, nodes or JVD. Chest: Respirations nl with BS clear & equal w/o rales, rhonchi, wheezing or stridor.  Cor: Heart sounds normal w/ regular rate and rhythm without sig. murmurs, gallops, clicks, or rubs. Peripheral pulses normal and equal  without edema.  Abdomen: Soft & bowel sounds normal. Non-tender w/o guarding, rebound, hernias, masses, or organomegaly.  Lymphatics: Unremarkable.  Musculoskeletal: Full ROM all peripheral extremities, joint stability, 5/5 strength, and normal gait. Rt palm CT scar  healed. Left CT scar also healed and appears unremarkable , but there is some swelling of the lefty wrist and hand w/o definite cellulitis and no signs of lymphangitis.  Skin: Warm, dry without exposed rashes, lesions or ecchymosis apparent.  Neuro: Cranial nerves intact, reflexes equal bilaterally. Sensory-motor testing grossly intact. Tendon reflexes grossly intact.  Pysch: Alert & oriented x 3.  Insight and judgement nl & appropriate. No ideations.  Assessment and Plan:  1. Essential hypertension  - TSH  2. Hyperlipidemia  - Lipid panel  3. 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 Vitamin D deficiency  - Vit D  25 hydroxy (rtn osteoporosis monitoring)  5. Morbid obesity (BMI 36.95)   6. Gastroesophageal reflux disease, esophagitis presence not specified   7. Testosterone Deficiency  - Testosterone  - Switching to Depo-Testosterone - given Rx today.  8. Medication management  - CBC with Differential/Platelet - BASIC METABOLIC PANEL WITH GFR - Hepatic function panel - Magnesium  9. Gout vs infection of Left Hand - defer Assessment & Treatment to Dr Amedeo Plenty   Recommended regular exercise, BP monitoring, weight control, and discussed med and SE's. Recommended labs to assess and monitor clinical status. Further disposition pending results of labs.

## 2014-10-06 LAB — INSULIN, RANDOM: Insulin: 30.3 u[IU]/mL — ABNORMAL HIGH (ref 2.0–19.6)

## 2014-10-06 LAB — TESTOSTERONE: Testosterone: 150 ng/dL — ABNORMAL LOW (ref 300–890)

## 2014-10-06 LAB — VITAMIN D 25 HYDROXY (VIT D DEFICIENCY, FRACTURES): Vit D, 25-Hydroxy: 28 ng/mL — ABNORMAL LOW (ref 30–100)

## 2014-10-07 ENCOUNTER — Other Ambulatory Visit: Payer: Self-pay | Admitting: *Deleted

## 2014-10-07 ENCOUNTER — Ambulatory Visit (INDEPENDENT_AMBULATORY_CARE_PROVIDER_SITE_OTHER): Payer: Managed Care, Other (non HMO) | Admitting: *Deleted

## 2014-10-07 DIAGNOSIS — E291 Testicular hypofunction: Secondary | ICD-10-CM

## 2014-10-07 MED ORDER — TESTOSTERONE CYPIONATE 200 MG/ML IM SOLN
300.0000 mg | Freq: Once | INTRAMUSCULAR | Status: AC
  Administered 2014-10-07: 300 mg via INTRAMUSCULAR

## 2014-10-07 MED ORDER — ONETOUCH VERIO IQ SYSTEM W/DEVICE KIT: PACK | Status: DC

## 2014-10-07 MED ORDER — GLUCOSE BLOOD VI STRP: ORAL_STRIP | Status: DC

## 2014-10-07 MED ORDER — LANCETS MISC
Status: DC
Start: 2014-10-07 — End: 2018-05-20

## 2014-10-07 NOTE — Progress Notes (Signed)
Patient ID: Phillip Powell, male   DOB: 1945/09/04, 69 y.o.   MRN: [id number] Patient and spouse came in for instruction on giving Testosterone injection.  Instructed spouse on the administration of injection in the patient's right hip and injected testosterone cypionate 200 mg/ml 1.5 ml IM in right upper outer quadrant.

## 2014-10-20 ENCOUNTER — Other Ambulatory Visit: Payer: Self-pay | Admitting: Internal Medicine

## 2014-11-01 ENCOUNTER — Encounter: Payer: Self-pay | Admitting: *Deleted

## 2014-11-11 ENCOUNTER — Other Ambulatory Visit: Payer: Self-pay | Admitting: Internal Medicine

## 2014-11-11 ENCOUNTER — Other Ambulatory Visit: Payer: Managed Care, Other (non HMO)

## 2014-11-11 DIAGNOSIS — E291 Testicular hypofunction: Secondary | ICD-10-CM

## 2014-11-11 DIAGNOSIS — E349 Endocrine disorder, unspecified: Secondary | ICD-10-CM

## 2014-11-12 LAB — TESTOSTERONE: Testosterone: 265 ng/dL — ABNORMAL LOW (ref 300–890)

## 2014-11-27 ENCOUNTER — Other Ambulatory Visit: Payer: Self-pay | Admitting: Internal Medicine

## 2015-01-11 ENCOUNTER — Encounter: Payer: Self-pay | Admitting: Internal Medicine

## 2015-01-11 ENCOUNTER — Ambulatory Visit (INDEPENDENT_AMBULATORY_CARE_PROVIDER_SITE_OTHER): Payer: Managed Care, Other (non HMO) | Admitting: Internal Medicine

## 2015-01-11 VITALS — BP 158/86 | HR 82 | Temp 98.2°F | Resp 18 | Ht 68.5 in | Wt 261.0 lb

## 2015-01-11 DIAGNOSIS — E559 Vitamin D deficiency, unspecified: Secondary | ICD-10-CM

## 2015-01-11 DIAGNOSIS — I10 Essential (primary) hypertension: Secondary | ICD-10-CM

## 2015-01-11 DIAGNOSIS — N183 Chronic kidney disease, stage 3 unspecified: Secondary | ICD-10-CM

## 2015-01-11 DIAGNOSIS — E782 Mixed hyperlipidemia: Secondary | ICD-10-CM

## 2015-01-11 DIAGNOSIS — Z79899 Other long term (current) drug therapy: Secondary | ICD-10-CM

## 2015-01-11 DIAGNOSIS — Z9119 Patient's noncompliance with other medical treatment and regimen: Secondary | ICD-10-CM

## 2015-01-11 DIAGNOSIS — E1122 Type 2 diabetes mellitus with diabetic chronic kidney disease: Secondary | ICD-10-CM

## 2015-01-11 DIAGNOSIS — Z91199 Patient's noncompliance with other medical treatment and regimen due to unspecified reason: Secondary | ICD-10-CM

## 2015-01-11 LAB — CBC WITH DIFFERENTIAL/PLATELET
Basophils Absolute: 0.1 10*3/uL (ref 0.0–0.1)
Basophils Relative: 1 % (ref 0–1)
Eosinophils Absolute: 0.1 10*3/uL (ref 0.0–0.7)
Eosinophils Relative: 1 % (ref 0–5)
HCT: 45.7 % (ref 39.0–52.0)
Hemoglobin: 15.7 g/dL (ref 13.0–17.0)
Lymphocytes Relative: 24 % (ref 12–46)
Lymphs Abs: 2.1 10*3/uL (ref 0.7–4.0)
MCH: 27.9 pg (ref 26.0–34.0)
MCHC: 34.4 g/dL (ref 30.0–36.0)
MCV: 81.3 fL (ref 78.0–100.0)
MPV: 9.4 fL (ref 8.6–12.4)
Monocytes Absolute: 0.4 10*3/uL (ref 0.1–1.0)
Monocytes Relative: 5 % (ref 3–12)
Neutro Abs: 6 10*3/uL (ref 1.7–7.7)
Neutrophils Relative %: 69 % (ref 43–77)
Platelets: 191 10*3/uL (ref 150–400)
RBC: 5.62 MIL/uL (ref 4.22–5.81)
RDW: 14.3 % (ref 11.5–15.5)
WBC: 8.7 10*3/uL (ref 4.0–10.5)

## 2015-01-11 LAB — LIPID PANEL
Cholesterol: 147 mg/dL (ref 0–200)
HDL: 26 mg/dL — ABNORMAL LOW (ref >=40)
LDL Cholesterol: 80 mg/dL (ref 0–99)
Total CHOL/HDL Ratio: 5.7 Ratio
Triglycerides: 204 mg/dL — ABNORMAL HIGH (ref <150)
VLDL: 41 mg/dL — ABNORMAL HIGH (ref 0–40)

## 2015-01-11 LAB — HEPATIC FUNCTION PANEL
ALT: 17 U/L (ref 0–53)
AST: 13 U/L (ref 0–37)
Albumin: 4 g/dL (ref 3.5–5.2)
Alkaline Phosphatase: 53 U/L (ref 39–117)
Bilirubin, Direct: 0.1 mg/dL (ref 0.0–0.3)
Indirect Bilirubin: 0.4 mg/dL (ref 0.2–1.2)
Total Bilirubin: 0.5 mg/dL (ref 0.2–1.2)
Total Protein: 6.2 g/dL (ref 6.0–8.3)

## 2015-01-11 LAB — HEMOGLOBIN A1C
Hgb A1c MFr Bld: 8.1 % — ABNORMAL HIGH (ref <5.7)
Mean Plasma Glucose: 186 mg/dL — ABNORMAL HIGH (ref <117)

## 2015-01-11 LAB — BASIC METABOLIC PANEL WITH GFR
BUN: 17 mg/dL (ref 6–23)
CO2: 24 mEq/L (ref 19–32)
Calcium: 9.3 mg/dL (ref 8.4–10.5)
Chloride: 98 mEq/L (ref 96–112)
Creat: 1.06 mg/dL (ref 0.50–1.35)
GFR, Est African American: 84 mL/min
GFR, Est Non African American: 72 mL/min
Glucose, Bld: 236 mg/dL — ABNORMAL HIGH (ref 70–99)
Potassium: 3.7 mEq/L (ref 3.5–5.3)
Sodium: 136 mEq/L (ref 135–145)

## 2015-01-11 LAB — MAGNESIUM: Magnesium: 1.5 mg/dL (ref 1.5–2.5)

## 2015-01-11 LAB — TSH: TSH: 1.453 u[IU]/mL (ref 0.350–4.500)

## 2015-01-11 NOTE — Patient Instructions (Signed)
Ways to cut 100 calories  1. Eat your eggs with hot sauce OR salsa instead of cheese.  Eggs are great for breakfast, but many people consider eggs and cheese to be BFFs. Instead of cheese-1 oz. of cheddar has 114 calories-top your eggs with hot sauce, which contains no calories and helps with satiety and metabolism. Salsa is also a great option!!  2. Top your toast, waffles or pancakes with mashed berries instead of jelly or syrup. Half a cup of berries-fresh, frozen or thawed-has about 40 calories, compared with 2 tbsp. of maple syrup or jelly, which both have about 100 calories. The berries will also give you a good punch of fiber, which helps keep you full and satisfied and won't spike blood sugar quickly like the jelly or syrup. 3. Swap the non-fat latte for black coffee with a splash of half-and-half. Contrary to its name, that non-fat latte has 130 calories and a startling 19g of carbohydrates per 16 oz. serving. Replacing that 'light' drinkable dessert with a black coffee with a splash of half-and-half saves you more than 100 calories per 16 oz. serving. 4. Sprinkle salads with freeze-dried raspberries instead of dried cranberries. If you want a sweet addition to your nutritious salad, stay away from dried cranberries. They have a whopping 130 calories per  cup and 30g carbohydrates. Instead, sprinkle freeze-dried raspberries guilt-free and save more than 100 calories per  cup serving, adding 3g of belly-filling fiber. 5. Go for mustard in place of mayo on your sandwich. Mustard can add really nice flavor to any sandwich, and there are tons of varieties, from spicy to honey. A serving of mayo is 95 calories, versus 10 calories in a serving of mustard. 6. Choose a DIY salad dressing instead of the store-bought kind. Mix Dijon or whole grain mustard with low-fat Kefir or red wine vinegar and garlic. 7. Use hummus as a spread instead of a dip. Use hummus as a spread on a high-fiber cracker or  tortilla with a sandwich and save on calories without sacrificing taste. 8. Pick just one salad "accessory." Salad isn't automatically a calorie winner. It's easy to over-accessorize with toppings. Instead of topping your salad with nuts, avocado and cranberries (all three will clock in at 313 calories), just pick one. The next day, choose a different accessory, which will also keep your salad interesting. You don't wear all your jewelry every day, right? 9. Ditch the white pasta in favor of spaghetti squash. One cup of cooked spaghetti squash has about 40 calories, compared with traditional spaghetti, which comes with more than 200. Spaghetti squash is also nutrient-dense. It's a good source of fiber and Vitamins A and C, and it can be eaten just like you would eat pasta-with a great tomato sauce and [place] meatballs or with pesto, tofu and spinach, for example. 10. Dress up your chili, soups and stews with non-fat [place] yogurt instead of sour cream. Just a 'dollop' of sour cream can set you back 115 calories and a whopping 12g of fat-seven of which are of the artery-clogging variety. Added bonus: [place] yogurt is packed with muscle-building protein, calcium and B Vitamins. 11. Mash cauliflower instead of mashed potatoes. One cup of traditional mashed potatoes-in all their creamy goodness-has more than 200 calories, compared to mashed cauliflower, which you can typically eat for less than 100 calories per 1 cup serving. Cauliflower is a great source of the antioxidant indole-3-carbinol (I3C), which may help reduce the risk of some cancers, like breast  cancer. 12. Ditch the ice cream sundae in favor of a [place] yogurt parfait. Instead of a cup of ice cream or fro-yo for dessert, try 1 cup of nonfat Greek yogurt topped with fresh berries and a sprinkle of cacao nibs. Both toppings are packed with antioxidants, which can help reduce cellular inflammation and oxidative damage. And the comparison is a  no-brainer: One cup of ice cream has about 275 calories; one cup of frozen yogurt has about 230; and a cup of Greek yogurt has just 130, plus twice the protein, so you're less likely to return to the freezer for a second helping. 13. Put olive oil in a spray container instead of using it directly from the bottle. Each tablespoon of olive oil is 120 calories and 15g of fat. Use a mister instead of pouring it straight into the pan or onto a salad. This allows for portion control and will save you more than 100 calories. 14. When baking, substitute canned pumpkin for butter or oil. Canned pumpkin-not pumpkin pie mix-is loaded with Vitamin A, which is important for skin and eye health, as well as immunity. And the comparisons are pretty crazy:  cup of canned pumpkin has about 40 calories, compared to butter or oil, which has more than 800 calories. Yes, 800 calories. Applesauce and mashed banana can also serve as good substitutions for butter or oil, usually in a 1:1 ratio. 15. Top casseroles with high-fiber cereal instead of breadcrumbs. Breadcrumbs are typically made with white bread, while breakfast cereals contain 5-9g of fiber per serving. Not only will you save more than 150 calories per  cup serving, the swap will also keep you more full and you'll get a metabolism boost from the added fiber. 16. Snack on pistachios instead of macadamia nuts. Believe it or not, you get the same amount of calories from 35 pistachios (100 calories) as you would from only five macadamia nuts. 17. Chow down on kale chips rather than potato chips. This is my favorite 'don't knock it 'till you try it' swap. Kale chips are so easy to make at home, and you can spice them up with a little grated parmesan or chili powder. Plus, they're a mere fraction of the calories of potato chips, but with the same crunch factor we crave so often. 18. Add seltzer and some fruit slices to your cocktail instead of soda or fruit juice. One  cup of soda or fruit juice can pack on as much as 140 calories. Instead, use seltzer and fruit slices. The fruit provides valuable phytochemicals, such as flavonoids and anthocyanins, which help to combat cancer and stave off the aging process.

## 2015-01-11 NOTE — Progress Notes (Signed)
Patient ID: Phillip Powell, male   DOB: Nov 02, 1945, 69 y.o.   MRN: [id number]  Assessment and Plan:  Hypertension:  -Non-compliant with diet ane exercise -non-compliant with medication dosing, encouraged taking medication daily at same time -patient to monitor blood pressure and call if over 160/90 consistently -Continue medication -Continue DASH diet -Reminder to go to the ER if any CP, SOB, nausea, dizziness, severe HA, changes vision/speech, left arm numbness and tingling and jaw pain.  Cholesterol - Continue diet and exercise -Check cholesterol.   Diabetes with diabetic chronic kidney disease -Continue diet and exercise.  -Check A1C -encouraged 10 lb weight loss prior to our next appointment  Vitamin D Def -check level -continue medications.   Non-compliance -encouraged compliance and discusses the complications that will arise with non-compliance  Continue diet and meds as discussed. Further disposition pending results of labs. Discussed med's effects and SE's.    HPI 69 y.o. male  presents for 3 month follow up with hypertension, hyperlipidemia, diabetes and vitamin D deficiency.   His blood pressure has been controlled at home, today their BP is BP: (!) 160/90 mmHg.He does not workout. He denies chest pain, shortness of breath, dizziness.  Patient reports that he forgot to take his medication yesterday.     He is not on cholesterol medication and denies myalgias. His cholesterol is at goal. The cholesterol was:  10/05/2014: Cholesterol 142; HDL 41; LDL Cholesterol 74; Triglycerides 135   He has not been working on diet and exercise for diabetes with diabetic chronic kidney disease, he is not on bASA, he is on ACE/ARB, and denies  foot ulcerations, hyperglycemia, hypoglycemia , increased appetite, nausea, paresthesia of the feet, polydipsia, polyuria, visual disturbances, vomiting and weight loss. Last A1C was: 10/05/2014: Hgb A1c MFr Bld 7.3*   Patient is on Vitamin D  supplement. 10/05/2014: Vit D, 25-Hydroxy 28*    Current Medications:  Current Outpatient Prescriptions on File Prior to Visit  Medication Sig Dispense Refill  . Ascorbic Acid (VITAMIN C) 1000 MG tablet Take 1,000 mg by mouth daily.    Marland Kitchen atenolol (TENORMIN) 100 MG tablet take 1 tablet by mouth once daily 90 tablet 1  . Blood Glucose Monitoring Suppl (ONETOUCH VERIO IQ SYSTEM) W/DEVICE KIT Patient test blood sugar once daily 1 kit 0  . Cholecalciferol (VITAMIN D) 2000 UNITS tablet Take 5,000 Units by mouth daily.     . Dulaglutide (TRULICITY) 1.5 YK/5.9DJ SOPN Inject 1 pen SQ once weekly 4 pen 6  . glucose blood (ONETOUCH VERIO) test strip Patient to check blood sugar once daily 100 each 12  . glyBURIDE (DIABETA) 5 MG tablet TAKE 1/2-1 TABLET THREE TIMES DAILY AFTER MEALS 270 tablet 1  . hydrochlorothiazide (HYDRODIURIL) 25 MG tablet take 1 tablet by mouth every morning for blood pressure 90 tablet 1  . JANUMET 50-500 MG per tablet TAKE 1 TABLET BY MOUTH 2 (TWO) TIMES DAILY WITH A MEAL. 60 tablet 3  . Lancets MISC Patient to check blood sugar once daily 100 each 12  . losartan (COZAAR) 100 MG tablet take 1 tablet by mouth once daily 90 tablet 1  . Magnesium 250 MG TABS Take by mouth 2 (two) times daily.    . meloxicam (MOBIC) 15 MG tablet TAKE 1 TABLET BY MOUTH DAILY WITH FOOD 90 tablet 8  . Pyridoxine HCl (VITAMIN B-6 PO) Take 200 mg by mouth daily.    . ranitidine (ZANTAC) 300 MG tablet Take 300 mg by mouth at bedtime.    Marland Kitchen  testosterone cypionate (DEPO-TESTOSTERONE) 200 MG/ML injection Inject 2 mLs (400 mg total) into the muscle every 14 (fourteen) days. And dispense 3 cc syringes & 1"  21 G needles 10 mL 2   No current facility-administered medications on file prior to visit.   Medical History:  Past Medical History  Diagnosis Date  . Hypertension   . Diabetes mellitus without complication   . GERD (gastroesophageal reflux disease)   . Hypogonadism male   . OSA (obstructive sleep  apnea)   . Vitamin D deficiency   . BPH (benign prostatic hypertrophy)    Allergies:  Allergies  Allergen Reactions  . Ace Inhibitors   . Ibuprofen     GI upset  . Invokana [Canagliflozin]   . Metformin And Related     Diarrhea  . Quinidine      Review of Systems:  Review of Systems  Constitutional: Negative for fever, chills and malaise/fatigue.  HENT: Negative for congestion, ear pain and sore throat.   Eyes: Negative for blurred vision and double vision.  Respiratory: Negative for cough, shortness of breath and wheezing.   Cardiovascular: Negative for chest pain, palpitations and leg swelling.  Gastrointestinal: Negative for heartburn, nausea, vomiting, diarrhea, constipation, blood in stool and melena.  Genitourinary: Negative.   Skin: Negative.   Neurological: Negative for dizziness, sensory change, loss of consciousness and headaches.  Psychiatric/Behavioral: Negative for depression. The patient is not nervous/anxious and does not have insomnia.     Family history- Review and unchanged  Social history- Review and unchanged  Physical Exam: BP 160/90 mmHg  Pulse 82  Temp(Src) 98.2 F (36.8 C) (Temporal)  Resp 18  Ht 5' 8.5" (1.74 m)  Wt 261 lb (118.389 kg)  BMI 39.10 kg/m2 Wt Readings from Last 3 Encounters:  01/11/15 261 lb (118.389 kg)  10/05/14 250 lb 12.8 oz (113.762 kg)  06/29/14 251 lb (113.853 kg)   General Appearance: Well nourished well developed, non-toxic appearing, in no apparent distress. Eyes: PERRLA, EOMs, conjunctiva no swelling or erythema ENT/Mouth: Ear canals clear with no erythema, swelling, or discharge.  TMs normal bilaterally, oropharynx clear, moist, with no exudate.   Neck: Supple, thyroid normal, no JVD, no cervical adenopathy.  Respiratory: Respiratory effort normal, breath sounds clear A&P, no wheeze, rhonchi or rales noted.  No retractions, no accessory muscle usage Cardio: RRR with no MRGs. No noted edema.  Abdomen: Obese, rotund  abdomen Soft, + BS.  Non tender, no guarding, rebound, hernias, masses. Musculoskeletal: Full ROM, 5/5 strength, Normal gait Skin: Warm, dry without rashes, lesions, ecchymosis.  Neuro: Awake and oriented X 3, Cranial nerves intact. No cerebellar symptoms.  Psych: normal affect, Insight and Judgment appropriate.    FORCUCCI, Alvenia Treese, PA-C 8:53 AM [facility]

## 2015-01-12 LAB — VITAMIN D 25 HYDROXY (VIT D DEFICIENCY, FRACTURES): Vit D, 25-Hydroxy: 28 ng/mL — ABNORMAL LOW (ref 30–100)

## 2015-01-12 LAB — INSULIN, RANDOM: Insulin: 121.6 u[IU]/mL — ABNORMAL HIGH (ref 2.0–19.6)

## 2015-02-01 ENCOUNTER — Other Ambulatory Visit: Payer: Self-pay | Admitting: Internal Medicine

## 2015-02-01 ENCOUNTER — Other Ambulatory Visit: Payer: Self-pay | Admitting: Physician Assistant

## 2015-03-03 ENCOUNTER — Encounter: Payer: Self-pay | Admitting: Internal Medicine

## 2015-03-10 ENCOUNTER — Other Ambulatory Visit: Payer: Self-pay | Admitting: Physician Assistant

## 2015-04-15 ENCOUNTER — Other Ambulatory Visit: Payer: Self-pay | Admitting: Physician Assistant

## 2015-05-06 ENCOUNTER — Other Ambulatory Visit: Payer: Self-pay | Admitting: Physician Assistant

## 2015-05-06 ENCOUNTER — Other Ambulatory Visit: Payer: Self-pay | Admitting: Internal Medicine

## 2015-05-24 ENCOUNTER — Ambulatory Visit (INDEPENDENT_AMBULATORY_CARE_PROVIDER_SITE_OTHER): Payer: Managed Care, Other (non HMO) | Admitting: Internal Medicine

## 2015-05-24 ENCOUNTER — Encounter: Payer: Self-pay | Admitting: Internal Medicine

## 2015-05-24 VITALS — BP 142/88 | HR 88 | Temp 97.8°F | Resp 16 | Ht 68.5 in | Wt 256.8 lb

## 2015-05-24 DIAGNOSIS — R6889 Other general symptoms and signs: Secondary | ICD-10-CM

## 2015-05-24 DIAGNOSIS — G4733 Obstructive sleep apnea (adult) (pediatric): Secondary | ICD-10-CM

## 2015-05-24 DIAGNOSIS — E1122 Type 2 diabetes mellitus with diabetic chronic kidney disease: Secondary | ICD-10-CM

## 2015-05-24 DIAGNOSIS — N183 Chronic kidney disease, stage 3 unspecified: Secondary | ICD-10-CM

## 2015-05-24 DIAGNOSIS — Z23 Encounter for immunization: Secondary | ICD-10-CM | POA: Diagnosis not present

## 2015-05-24 DIAGNOSIS — Z79899 Other long term (current) drug therapy: Secondary | ICD-10-CM

## 2015-05-24 DIAGNOSIS — Z0001 Encounter for general adult medical examination with abnormal findings: Secondary | ICD-10-CM

## 2015-05-24 DIAGNOSIS — R5383 Other fatigue: Secondary | ICD-10-CM

## 2015-05-24 DIAGNOSIS — Z125 Encounter for screening for malignant neoplasm of prostate: Secondary | ICD-10-CM | POA: Diagnosis not present

## 2015-05-24 DIAGNOSIS — I10 Essential (primary) hypertension: Secondary | ICD-10-CM | POA: Diagnosis not present

## 2015-05-24 DIAGNOSIS — Z1212 Encounter for screening for malignant neoplasm of rectum: Secondary | ICD-10-CM

## 2015-05-24 DIAGNOSIS — E291 Testicular hypofunction: Secondary | ICD-10-CM

## 2015-05-24 DIAGNOSIS — N400 Enlarged prostate without lower urinary tract symptoms: Secondary | ICD-10-CM

## 2015-05-24 DIAGNOSIS — E782 Mixed hyperlipidemia: Secondary | ICD-10-CM

## 2015-05-24 DIAGNOSIS — E559 Vitamin D deficiency, unspecified: Secondary | ICD-10-CM

## 2015-05-24 DIAGNOSIS — Z6838 Body mass index (BMI) 38.0-38.9, adult: Secondary | ICD-10-CM

## 2015-05-24 DIAGNOSIS — K219 Gastro-esophageal reflux disease without esophagitis: Secondary | ICD-10-CM

## 2015-05-24 DIAGNOSIS — Z6841 Body Mass Index (BMI) 40.0 and over, adult: Secondary | ICD-10-CM

## 2015-05-24 LAB — LIPID PANEL
Cholesterol: 155 mg/dL (ref 125–200)
HDL: 23 mg/dL — ABNORMAL LOW (ref >=40)
LDL Cholesterol: 68 mg/dL (ref <130)
Total CHOL/HDL Ratio: 6.7 Ratio — ABNORMAL HIGH (ref <=5.0)
Triglycerides: 318 mg/dL — ABNORMAL HIGH (ref <150)
VLDL: 64 mg/dL — ABNORMAL HIGH (ref <30)

## 2015-05-24 LAB — BASIC METABOLIC PANEL WITH GFR
BUN: 18 mg/dL (ref 7–25)
CO2: 28 mmol/L (ref 20–31)
Calcium: 9.4 mg/dL (ref 8.6–10.3)
Chloride: 95 mmol/L — ABNORMAL LOW (ref 98–110)
Creat: 1.18 mg/dL (ref 0.70–1.25)
GFR, Est African American: 73 mL/min (ref >=60)
GFR, Est Non African American: 63 mL/min (ref >=60)
Glucose, Bld: 209 mg/dL — ABNORMAL HIGH (ref 65–99)
Potassium: 4.2 mmol/L (ref 3.5–5.3)
Sodium: 134 mmol/L — ABNORMAL LOW (ref 135–146)

## 2015-05-24 LAB — CBC WITH DIFFERENTIAL/PLATELET
Basophils Absolute: 0 10*3/uL (ref 0.0–0.1)
Basophils Relative: 0 % (ref 0–1)
Eosinophils Absolute: 0.2 10*3/uL (ref 0.0–0.7)
Eosinophils Relative: 2 % (ref 0–5)
HCT: 49.5 % (ref 39.0–52.0)
Hemoglobin: 16.7 g/dL (ref 13.0–17.0)
Lymphocytes Relative: 22 % (ref 12–46)
Lymphs Abs: 2 10*3/uL (ref 0.7–4.0)
MCH: 27.3 pg (ref 26.0–34.0)
MCHC: 33.7 g/dL (ref 30.0–36.0)
MCV: 80.9 fL (ref 78.0–100.0)
MPV: 9.2 fL (ref 8.6–12.4)
Monocytes Absolute: 0.7 10*3/uL (ref 0.1–1.0)
Monocytes Relative: 7 % (ref 3–12)
Neutro Abs: 6.4 10*3/uL (ref 1.7–7.7)
Neutrophils Relative %: 69 % (ref 43–77)
Platelets: 185 10*3/uL (ref 150–400)
RBC: 6.12 MIL/uL — ABNORMAL HIGH (ref 4.22–5.81)
RDW: 14.9 % (ref 11.5–15.5)
WBC: 9.3 10*3/uL (ref 4.0–10.5)

## 2015-05-24 LAB — HEPATIC FUNCTION PANEL
ALT: 21 U/L (ref 9–46)
AST: 19 U/L (ref 10–35)
Albumin: 4 g/dL (ref 3.6–5.1)
Alkaline Phosphatase: 56 U/L (ref 40–115)
Bilirubin, Direct: 0.1 mg/dL (ref <=0.2)
Indirect Bilirubin: 0.5 mg/dL (ref 0.2–1.2)
Total Bilirubin: 0.6 mg/dL (ref 0.2–1.2)
Total Protein: 6.4 g/dL (ref 6.1–8.1)

## 2015-05-24 LAB — HEMOGLOBIN A1C
Hgb A1c MFr Bld: 8 % — ABNORMAL HIGH (ref <5.7)
Mean Plasma Glucose: 183 mg/dL — ABNORMAL HIGH (ref <117)

## 2015-05-24 LAB — IRON AND TIBC
%SAT: 16 % (ref 15–60)
Iron: 64 ug/dL (ref 50–180)
TIBC: 400 ug/dL (ref 250–425)
UIBC: 336 ug/dL (ref 125–400)

## 2015-05-24 LAB — VITAMIN B12: Vitamin B-12: 392 pg/mL (ref 211–911)

## 2015-05-24 LAB — MAGNESIUM: Magnesium: 1.6 mg/dL (ref 1.5–2.5)

## 2015-05-24 LAB — TSH: TSH: 1.509 u[IU]/mL (ref 0.350–4.500)

## 2015-05-24 NOTE — Patient Instructions (Signed)

## 2015-05-24 NOTE — Progress Notes (Signed)
Patient ID: Phillip Powell, male   DOB: 12-Mar-1946, 69 y.o.   MRN: [id number]    Annual Preventative/Wellness  Visit And Comprehensive Evaluation & Examination  This very nice 69 y.o. MWM presents for presents for a Wellness/Preventative Visit & comprehensive evaluation and management of multiple medical co-morbidities.  Patient has been followed for HTN, Morbid Obesity T2_NIDDM, Hyperlipidemia, GERD, Testosterone  and Vitamin D Deficiency.  Also today, he's c/o pain of the left knee and plans f/u with Dr Gladstone Lighter who did Lt Knee Arthroscopy in 2006 and also bilat rot cuff surg in 1996/1997. In addition patient is on CPAP for hx/o OSA with improved sleep hygiene.     HTN predates since 1984. Patient's BP has been controlled at home.Today's BP is slightly elevated at 142/88. Patient denies any cardiac symptoms as chest pain, palpitations, shortness of breath, dizziness or ankle swelling.   Patient's hyperlipidemia is controlled with diet and medications. Patient denies myalgias or other medication SE's. Last lipids were at goal with Cholesterol 147; HDL 26*; LDL 80; but with elevated Triglycerides 204 on 01/11/2015.   Patient has Morbid Obesity (BMI 38+) and consequent T2_NIDDM since 1997 and patient denies reactive hypoglycemic symptoms, visual blurring, diabetic polys or paresthesias. Last A1c was 8.1% on 01/11/2015.   Patient is also on Testosterone replacement therapy with hx/o lot T level 208 in 2012 and 186.9 in 2013 and has been on injection therapy since with improved sense of well being. Finally, patient has history of Vitamin D Deficiency of 37 in 2008 and last vitamin D was even lower at 28 on  01/11/2015.      Medication Sig  . atenolol  100 MG tablet take 1 tablet by mouth once daily  . Blood Glucose Monitoring Suppl (ONETOUCH VERIO IQ SYSTEM) W/DEVICE KIT Patient test blood sugar once daily  . glucose blood (ONETOUCH VERIO) test strip Patient to check blood sugar once daily  . glyBURIDE  5  MG tablet take 1/2 to 1 tablet by mouth three times a day after meals  . hctz 25 MG tablet take 1 tablet by mouth every morning for blood pressure  . JANUMET 50-500 MG per tablet take 1 tablet by mouth twice a day WITH A MEAL  . Lancets MISC Patient to check blood sugar once daily  . Losartan  100 MG tablet take 1 tablet by mouth once daily  . Magnesium 250 MG TABS Take by mouth 2 (two) times daily.  . meloxicam (MOBIC) 15 MG tablet take 1 tablet by mouth once daily with food  . ranitidine  300 MG capsule take 1 capsule by mouth once daily  . DEPO-TESTOSTERONE  200 MG/ML inj INJECT 2 MILLILITERS INTO THE MUSCLE EVERY 14 DAYS  . TRULICITY 1.5 WU/9.8JX SOPN INJECT 1 PEN SUBCUTANEOUSLY ONCE A WEEK  . VITAMIN C 1000 MG tablet Take 1,000 mg by mouth daily.  Marland Kitchen VITAMIN D 2000 UNITS tablet Take 5,000 Units by mouth daily.   . Pyridoxine(VITAMIN B-6) Take 200 mg by mouth daily.  . ranitidine 300 MG tablet Take 300 mg by mouth at bedtime.   Allergies  Allergen Reactions  . Ace Inhibitors   . Ibuprofen     GI upset  . Invokana [Canagliflozin]   . Metformin And Related     Diarrhea  . Quinidine    Past Medical History  Diagnosis Date  . Hypertension   . Diabetes mellitus without complication ([place])   . GERD (gastroesophageal reflux disease)   . Hypogonadism  male   . OSA (obstructive sleep apnea)   . Vitamin D deficiency   . BPH (benign prostatic hypertrophy)    Health Maintenance  Topic Date Due  . OPHTHALMOLOGY EXAM  06/24/1957  . TETANUS/TDAP  06/23/2012  . PNA vac Low Risk Adult (2 of 2 - PPSV23) 01/06/2015  . INFLUENZA VACCINE  02/22/2015  . FOOT EXAM  03/03/2015  . URINE MICROALBUMIN  03/03/2015  . HEMOGLOBIN A1C  07/13/2015  . COLONOSCOPY  07/25/2015  . ZOSTAVAX  Completed  . Hepatitis C Screening  Completed   Immunization History  Administered Date(s) Administered  . Influenza Split 06/23/2013  . Influenza, High Dose Seasonal PF 06/29/2014, 05/24/2015  . Pneumococcal  Conjugate-13 01/05/2014  . Pneumococcal-Unspecified 07/24/1994  . Td 06/23/2002  . Zoster 10/23/2010   Past Surgical History  Procedure Laterality Date  . Tonsillectomy  1952  . Rotator cuff repair Left 1996  . Rotator cuff repair Right 1997  . Hernia repair  8937    umbilical   Family History  Problem Relation Age of Onset  . Cancer Mother     throat  . Diabetes Mother   . Thyroid disease Father   . Cancer Father     lung  . Thyroid disease Sister   . Hypertension Son   . Hyperlipidemia Son   . Cancer Paternal Grandmother     colon   Social History   Social History  . Marital Status: Married    Spouse Name: N/A  . Number of Children: N/A  . Years of Education: N/A   Occupational History  . [employer] farm manager   Social History Main Topics  . Smoking status: Never Smoker   . Smokeless tobacco: Not on file  . Alcohol Use: Yes     Comment: very rarely  . Drug Use: Not on file  . Sexual Activity: Not on file     ROS Constitutional: Denies fever, chills, weight loss/gain, headaches, insomnia,  night sweats or change in appetite. Does c/o fatigue. Eyes: Denies redness, blurred vision, diplopia, discharge, itchy or watery eyes.  ENT: Denies discharge, congestion, post nasal drip, epistaxis, sore throat, earache, hearing loss, dental pain, Tinnitus, Vertigo, Sinus pain or snoring.  Cardio: Denies chest pain, palpitations, irregular heartbeat, syncope, dyspnea, diaphoresis, orthopnea, PND, claudication or edema Respiratory: denies cough, dyspnea, DOE, pleurisy, hoarseness, laryngitis or wheezing.  Gastrointestinal: Denies dysphagia, heartburn, reflux, water brash, pain, cramps, nausea, vomiting, bloating, diarrhea, constipation, hematemesis, melena, hematochezia, jaundice or hemorrhoids Genitourinary: Denies dysuria, frequency, urgency, nocturia, hesitancy, discharge, hematuria or flank pain Musculoskeletal: Denies arthralgia, myalgia, stiffness, Jt. Swelling,  pain, limp or strain/sprain. Denies Falls. Skin: Denies puritis, rash, hives, warts, acne, eczema or change in skin lesion Neuro: No weakness, tremor, incoordination, spasms, paresthesia or pain Psychiatric: Denies confusion, memory loss or sensory loss. Denies Depression. Endocrine: Denies change in weight, skin, hair change, nocturia, and paresthesia, diabetic polys, visual blurring or hyper / hypo glycemic episodes.  Heme/Lymph: No excessive bleeding, bruising or enlarged lymph nodes.  Physical Exam  BP 142/88 mmHg  Pulse 88  Temp(Src) 97.8 F (36.6 C)  Resp 16  Ht 5' 8.5" (1.74 m)  Wt 256 lb 12.8 oz (116.484 kg)  BMI 38.47 kg/m2  General Appearance: Over nourished &  in no apparent distress. Eyes: PERRLA, EOMs, conjunctiva no swelling or erythema, normal fundi and vessels. Sinuses: No frontal/maxillary tenderness ENT/Mouth: EACs patent / TMs  nl. Nares clear without erythema, swelling, mucoid exudates. Oral hygiene is good. No  erythema, swelling, or exudate. Tongue normal, non-obstructing. Tonsils not swollen or erythematous. Hearing normal.  Neck: Supple, thyroid normal. No bruits, nodes or JVD. Respiratory: Respiratory effort normal.  BS equal and clear bilateral without rales, rhonci, wheezing or stridor. Cardio: Heart sounds are normal with regular rate and rhythm and no murmurs, rubs or gallops. Peripheral pulses are normal and equal bilaterally without edema. No aortic or femoral bruits. Chest: symmetric with normal excursions and percussion.  Abdomen: Flat, soft, with bowel sounds. Nontender, no guarding, rebound, hernias, masses, or organomegaly.  Lymphatics: Non tender without lymphadenopathy.  Genitourinary: No hernias.Testes nl. DRE - prostate nl for age - smooth & firm w/o nodules. Musculoskeletal: Full ROM all peripheral extremities, joint stability, 5/5 strength, and normal gait. Skin: Warm and dry without rashes, lesions, cyanosis, clubbing or  ecchymosis.  Neuro:  Cranial nerves intact, reflexes equal bilaterally. Normal muscle tone, no cerebellar symptoms. Sensation intact.  Pysch: Alert and oriented X 3 with normal affect, insight and judgment appropriate.   Assessment and Plan  1. Encounter for general adult medical examination with abnormal findings   2. Essential hypertension  - EKG 12-Lead - [place], RETROPERITNL ABD,  LTD - TSH  3. Hyperlipidemia  - Lipid panel  4. 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 Vitamin D deficiency  - Vit D  25 hydroxy   6. Gastroesophageal reflux disease   7. OSA (obstructive sleep apnea)   8. Testosterone Deficiency  - Testosterone  9. BPH (benign prostatic hypertrophy)   10. Morbid obesity (BMI 36.95)   11. Screening for rectal cancer  - POC Hemoccult Bld/Stl   12. Prostate cancer screening  - PSA  13. BMI 38.0-38.9,adult   14. Need for prophylactic vaccination and inoculation against influenza  - Flu vaccine HIGH DOSE PF (Fluzone High dose)  15. Other fatigue  - Vitamin B12 - Testosterone - Iron and TIBC - TSH  16. Medication management  - Urinalysis, Routine w reflex microscopic  - CBC with Differential/Platelet - BASIC METABOLIC PANEL WITH GFR - Hepatic function panel - Magnesium   Continue prudent diet as discussed, weight control, BP monitoring, regular exercise, and medications as discussed.  Discussed med effects and SE's. Routine screening labs and tests as requested with regular follow-up as recommended.  Over 40 minutes of exam, counseling &  chart review was performed

## 2015-05-25 LAB — URINALYSIS, MICROSCOPIC ONLY
Bacteria, UA: NONE SEEN [HPF]
Casts: NONE SEEN [LPF]
Crystals: NONE SEEN [HPF]
Squamous Epithelial / LPF: NONE SEEN [HPF] (ref <=5)
WBC, UA: NONE SEEN WBC/HPF (ref <=5)
Yeast: NONE SEEN [HPF]

## 2015-05-25 LAB — VITAMIN D 25 HYDROXY (VIT D DEFICIENCY, FRACTURES): Vit D, 25-Hydroxy: 26 ng/mL — ABNORMAL LOW (ref 30–100)

## 2015-05-25 LAB — URINALYSIS, ROUTINE W REFLEX MICROSCOPIC
Bilirubin Urine: NEGATIVE
Glucose, UA: NEGATIVE
Ketones, ur: NEGATIVE
Leukocytes, UA: NEGATIVE
Nitrite: NEGATIVE
Protein, ur: NEGATIVE
Specific Gravity, Urine: 1.018 (ref 1.001–1.035)
pH: 6 (ref 5.0–8.0)

## 2015-05-25 LAB — TESTOSTERONE: Testosterone: 554 ng/dL (ref 300–890)

## 2015-05-25 LAB — PSA: PSA: 0.93 ng/mL (ref <=4.00)

## 2015-05-25 LAB — MICROALBUMIN / CREATININE URINE RATIO
Creatinine, Urine: 128 mg/dL (ref 20–370)
Microalb Creat Ratio: 10 mcg/mg creat (ref <30)
Microalb, Ur: 1.3 mg/dL

## 2015-05-25 LAB — INSULIN, RANDOM: Insulin: 34.3 u[IU]/mL — ABNORMAL HIGH (ref 2.0–19.6)

## 2015-06-01 ENCOUNTER — Telehealth: Payer: Self-pay | Admitting: *Deleted

## 2015-06-01 NOTE — Telephone Encounter (Signed)
Patient called stating he needs needs proof that PSA was checked at his last office visit.  Per Dr. Idell Pickles orders, patient's labs were printed and mailed to patient with statement from Dr. Melford Aase written on labs.

## 2015-06-10 ENCOUNTER — Telehealth: Payer: Self-pay | Admitting: *Deleted

## 2015-06-10 MED ORDER — MECLIZINE HCL 25 MG PO TABS: ORAL_TABLET | ORAL | Status: DC

## 2015-06-10 NOTE — Telephone Encounter (Signed)
Patient's spouse called and states he is having dizziness and nausea that stated today.  Per Dr Melford Aase, an [place] for Meclizine can be sent in.

## 2015-06-11 ENCOUNTER — Telehealth: Payer: Self-pay | Admitting: *Deleted

## 2015-06-11 NOTE — Telephone Encounter (Signed)
Spouse called and reported the patient is feeling better today, except his chest is sore.  Patient was vomiting yesterday and she asked if that could cause the soreness.  Per Dr Melford Aase, vomiting could cause this.  Spouse states the patient's BP and blood sugar is within a good range today.

## 2015-06-24 ENCOUNTER — Other Ambulatory Visit: Payer: Self-pay | Admitting: Internal Medicine

## 2015-07-01 ENCOUNTER — Other Ambulatory Visit: Payer: Self-pay | Admitting: Internal Medicine

## 2015-08-12 ENCOUNTER — Other Ambulatory Visit: Payer: Self-pay | Admitting: Internal Medicine

## 2015-08-25 ENCOUNTER — Other Ambulatory Visit: Payer: Self-pay | Admitting: Internal Medicine

## 2015-09-15 ENCOUNTER — Other Ambulatory Visit: Payer: Self-pay | Admitting: Internal Medicine

## 2015-09-20 ENCOUNTER — Ambulatory Visit (INDEPENDENT_AMBULATORY_CARE_PROVIDER_SITE_OTHER): Payer: Managed Care, Other (non HMO) | Admitting: Internal Medicine

## 2015-09-20 ENCOUNTER — Encounter: Payer: Self-pay | Admitting: Internal Medicine

## 2015-09-20 VITALS — BP 146/88 | HR 88 | Temp 98.2°F | Resp 16 | Ht 68.5 in | Wt 259.0 lb

## 2015-09-20 DIAGNOSIS — E291 Testicular hypofunction: Secondary | ICD-10-CM | POA: Diagnosis not present

## 2015-09-20 DIAGNOSIS — E782 Mixed hyperlipidemia: Secondary | ICD-10-CM

## 2015-09-20 DIAGNOSIS — N183 Chronic kidney disease, stage 3 unspecified: Secondary | ICD-10-CM

## 2015-09-20 DIAGNOSIS — I10 Essential (primary) hypertension: Secondary | ICD-10-CM | POA: Diagnosis not present

## 2015-09-20 DIAGNOSIS — E1122 Type 2 diabetes mellitus with diabetic chronic kidney disease: Secondary | ICD-10-CM

## 2015-09-20 DIAGNOSIS — Z79899 Other long term (current) drug therapy: Secondary | ICD-10-CM

## 2015-09-20 DIAGNOSIS — E559 Vitamin D deficiency, unspecified: Secondary | ICD-10-CM

## 2015-09-20 LAB — BASIC METABOLIC PANEL WITH GFR
BUN: 19 mg/dL (ref 7–25)
CO2: 32 mmol/L — ABNORMAL HIGH (ref 20–31)
Calcium: 9.5 mg/dL (ref 8.6–10.3)
Chloride: 97 mmol/L — ABNORMAL LOW (ref 98–110)
Creat: 1.1 mg/dL (ref 0.70–1.25)
GFR, Est African American: 79 mL/min (ref >=60)
GFR, Est Non African American: 69 mL/min (ref >=60)
Glucose, Bld: 156 mg/dL — ABNORMAL HIGH (ref 65–99)
Potassium: 4.3 mmol/L (ref 3.5–5.3)
Sodium: 138 mmol/L (ref 135–146)

## 2015-09-20 LAB — CBC WITH DIFFERENTIAL/PLATELET
Basophils Absolute: 0 10*3/uL (ref 0.0–0.1)
Basophils Relative: 0 % (ref 0–1)
Eosinophils Absolute: 0.2 10*3/uL (ref 0.0–0.7)
Eosinophils Relative: 2 % (ref 0–5)
HCT: 51.9 % (ref 39.0–52.0)
Hemoglobin: 17.5 g/dL — ABNORMAL HIGH (ref 13.0–17.0)
Lymphocytes Relative: 25 % (ref 12–46)
Lymphs Abs: 2.4 10*3/uL (ref 0.7–4.0)
MCH: 27.1 pg (ref 26.0–34.0)
MCHC: 33.7 g/dL (ref 30.0–36.0)
MCV: 80.5 fL (ref 78.0–100.0)
MPV: 8.8 fL (ref 8.6–12.4)
Monocytes Absolute: 0.9 10*3/uL (ref 0.1–1.0)
Monocytes Relative: 9 % (ref 3–12)
Neutro Abs: 6.1 10*3/uL (ref 1.7–7.7)
Neutrophils Relative %: 64 % (ref 43–77)
Platelets: 161 10*3/uL (ref 150–400)
RBC: 6.45 MIL/uL — ABNORMAL HIGH (ref 4.22–5.81)
RDW: 14.9 % (ref 11.5–15.5)
WBC: 9.5 10*3/uL (ref 4.0–10.5)

## 2015-09-20 LAB — HEPATIC FUNCTION PANEL
ALT: 19 U/L (ref 9–46)
AST: 17 U/L (ref 10–35)
Albumin: 4 g/dL (ref 3.6–5.1)
Alkaline Phosphatase: 45 U/L (ref 40–115)
Bilirubin, Direct: 0.2 mg/dL (ref <=0.2)
Indirect Bilirubin: 0.6 mg/dL (ref 0.2–1.2)
Total Bilirubin: 0.8 mg/dL (ref 0.2–1.2)
Total Protein: 6.3 g/dL (ref 6.1–8.1)

## 2015-09-20 LAB — LIPID PANEL
Cholesterol: 137 mg/dL (ref 125–200)
HDL: 28 mg/dL — ABNORMAL LOW (ref >=40)
LDL Cholesterol: 68 mg/dL (ref <130)
Total CHOL/HDL Ratio: 4.9 Ratio (ref <=5.0)
Triglycerides: 205 mg/dL — ABNORMAL HIGH (ref <150)
VLDL: 41 mg/dL — ABNORMAL HIGH (ref <30)

## 2015-09-20 LAB — HEMOGLOBIN A1C
Hgb A1c MFr Bld: 8.4 % — ABNORMAL HIGH (ref <5.7)
Mean Plasma Glucose: 194 mg/dL — ABNORMAL HIGH (ref <117)

## 2015-09-20 LAB — TSH: TSH: 2.29 mIU/L (ref 0.40–4.50)

## 2015-09-20 MED ORDER — TESTOSTERONE CYPIONATE 200 MG/ML IM SOLN
400.0000 mg | Freq: Once | INTRAMUSCULAR | Status: AC
  Administered 2015-09-20: 400 mg via INTRAMUSCULAR

## 2015-09-20 NOTE — Addendum Note (Signed)
Addended by: Riyansh Gerstner A on: 09/20/2015 09:56 AM   Modules accepted: Orders

## 2015-09-20 NOTE — Progress Notes (Signed)
Patient ID: Phillip Powell, male   DOB: 14-Aug-1945, 70 y.o.   MRN: [id number]  Assessment and Plan:  Hypertension:  -Continue medication -monitor blood pressure at home. -Continue DASH diet -Reminder to go to the ER if any CP, SOB, nausea, dizziness, severe HA, changes vision/speech, left arm numbness and tingling and jaw pain.  Cholesterol - Continue diet and exercise -Check cholesterol.   Diabetes with diabetic chronic kidney disease  May need adjustment of meds based on A1C -Continue diet and exercise.  -Check A1C  Vitamin D Def -check level -continue medications.   Testosterone -shot given today  Continue diet and meds as discussed. Further disposition pending results of labs. Discussed med's effects and SE's.    HPI 70 y.o. male  presents for 3 month follow up with hypertension, hyperlipidemia, diabetes and vitamin D deficiency.   His blood pressure has been controlled at home, today their BP is BP: (!) 146/88 mmHg.He does not workout. He denies chest pain, shortness of breath, dizziness.    He is on cholesterol medication and denies myalgias. His cholesterol is at goal. The cholesterol was:  05/24/2015: Cholesterol 155; HDL 23*; LDL Cholesterol 68; Triglycerides 318*   He has been working on diet and exercise for diabetes with diabetic chronic kidney disease, he is on bASA, he is on ACE/ARB, and denies  foot ulcerations, hyperglycemia, hypoglycemia , increased appetite, nausea, paresthesia of the feet, polydipsia, polyuria, visual disturbances, vomiting and weight loss. Last A1C was: 05/24/2015: Hgb A1c MFr Bld 8.0* .  Blood sugars when he wakes up 125-135 .  No BS less than 70.   Patient is on Vitamin D supplement. 05/24/2015: Vit D, 25-Hydroxy 26*    Current Medications:  Current Outpatient Prescriptions on File Prior to Visit  Medication Sig Dispense Refill  . atenolol (TENORMIN) 100 MG tablet take 1 tablet by mouth once daily 90 tablet 3  . B-D 3CC LUER-LOK SYR  21GX1" 21G X 1" 3 ML MISC   0  . Blood Glucose Monitoring Suppl (ONETOUCH VERIO IQ SYSTEM) W/DEVICE KIT Patient test blood sugar once daily 1 kit 0  . glucose blood (ONETOUCH VERIO) test strip Patient to check blood sugar once daily 100 each 12  . glyBURIDE (DIABETA) 5 MG tablet TAKE 1/2 TO 1 TABLET BY MOUTH 3 TIMES A DAY AFTER MEALS 270 tablet 0  . hydrochlorothiazide (HYDRODIURIL) 25 MG tablet take 1 tablet by mouth every morning for blood pressure 90 tablet 1  . JANUMET 50-500 MG tablet take 1 tablet by mouth twice a day with food 180 tablet 1  . Lancets MISC Patient to check blood sugar once daily 100 each 12  . losartan (COZAAR) 100 MG tablet take 1 tablet by mouth once daily 90 tablet 1  . Magnesium 250 MG TABS Take by mouth 2 (two) times daily.    . meclizine (ANTIVERT) 25 MG tablet Take 1-2 tablets 3 times daily as needed for dizziness and nausea. 50 tablet 0  . meloxicam (MOBIC) 15 MG tablet take 1 tablet by mouth once daily with food 90 tablet 1  . ranitidine (ZANTAC) 300 MG capsule take 1 capsule by mouth once daily 90 capsule 1  . testosterone cypionate (DEPOTESTOSTERONE CYPIONATE) 200 MG/ML injection INJECT 2 MILLILITERS INTO THE MUSCLE EVERY 14 DAYS 10 mL 2  . TRULICITY 1.5 ZJ/6.7HA SOPN INJECT 1 PEN SUBCUTANEOUSLY ONCE A WEEK 2 pen 2   No current facility-administered medications on file prior to visit.   Medical History:  Past Medical History  Diagnosis Date  . Hypertension   . Diabetes mellitus without complication ([place])   . GERD (gastroesophageal reflux disease)   . Hypogonadism male   . OSA (obstructive sleep apnea)   . Vitamin D deficiency   . BPH (benign prostatic hypertrophy)    Allergies:  Allergies  Allergen Reactions  . Ace Inhibitors   . Ibuprofen     GI upset  . Invokana [Canagliflozin]   . Metformin And Related     Diarrhea  . Quinidine      Review of Systems:  Review of Systems  Constitutional: Negative for fever, chills and malaise/fatigue.   HENT: Negative for congestion, ear pain and sore throat.   Eyes: Negative.   Respiratory: Negative for cough, shortness of breath and wheezing.   Cardiovascular: Negative for chest pain, palpitations and leg swelling.  Gastrointestinal: Negative for heartburn, abdominal pain, diarrhea, constipation, blood in stool and melena.  Genitourinary: Negative.   Skin: Negative.   Neurological: Negative for dizziness, sensory change, loss of consciousness and headaches.  Psychiatric/Behavioral: Negative for depression. The patient is not nervous/anxious and does not have insomnia.     Family history- Review and unchanged  Social history- Review and unchanged  Physical Exam: BP 146/88 mmHg  Pulse 88  Temp(Src) 98.2 F (36.8 C) (Temporal)  Resp 16  Ht 5' 8.5" (1.74 m)  Wt 259 lb (117.482 kg)  BMI 38.80 kg/m2 Wt Readings from Last 3 Encounters:  09/20/15 259 lb (117.482 kg)  05/24/15 256 lb 12.8 oz (116.484 kg)  01/11/15 261 lb (118.389 kg)   General Appearance: Well nourished well developed, non-toxic appearing, in no apparent distress. Eyes: PERRLA, EOMs, conjunctiva no swelling or erythema ENT/Mouth: Ear canals clear with no erythema, swelling, or discharge.  TMs normal bilaterally, oropharynx clear, moist, with no exudate.   Neck: Supple, thyroid normal, no JVD, no cervical adenopathy.  Respiratory: Respiratory effort normal, breath sounds clear A&P, no wheeze, rhonchi or rales noted.  No retractions, no accessory muscle usage Cardio: RRR with no MRGs. No noted edema.  Abdomen: Soft, + BS.  Non tender, no guarding, rebound, hernias, masses. Musculoskeletal: Full ROM, 5/5 strength, Normal gait Skin: Warm, dry without rashes, lesions, ecchymosis.  Neuro: Awake and oriented X 3, Cranial nerves intact. No cerebellar symptoms.  Psych: normal affect, Insight and Judgment appropriate.    Starlyn Skeans, PA-C 9:03 AM [facility]

## 2015-09-23 ENCOUNTER — Other Ambulatory Visit: Payer: Self-pay | Admitting: Internal Medicine

## 2015-09-30 ENCOUNTER — Other Ambulatory Visit: Payer: Self-pay | Admitting: Internal Medicine

## 2015-11-01 ENCOUNTER — Encounter: Payer: Self-pay | Admitting: Internal Medicine

## 2015-11-01 ENCOUNTER — Ambulatory Visit (INDEPENDENT_AMBULATORY_CARE_PROVIDER_SITE_OTHER): Payer: Managed Care, Other (non HMO) | Admitting: Internal Medicine

## 2015-11-01 ENCOUNTER — Other Ambulatory Visit: Payer: Self-pay | Admitting: Internal Medicine

## 2015-11-01 VITALS — BP 150/90 | HR 88 | Temp 97.9°F | Resp 16 | Ht 68.5 in | Wt 258.6 lb

## 2015-11-01 DIAGNOSIS — J309 Allergic rhinitis, unspecified: Secondary | ICD-10-CM

## 2015-11-01 MED ORDER — AZITHROMYCIN 250 MG PO TABS: ORAL_TABLET | ORAL | Status: DC

## 2015-11-01 MED ORDER — FLUTICASONE PROPIONATE 50 MCG/ACT NA SUSP: 2.0000 | Freq: Every day | NASAL | Status: DC

## 2015-11-01 MED ORDER — PROMETHAZINE-DM 6.25-15 MG/5ML PO SYRP: ORAL_SOLUTION | ORAL | Status: DC

## 2015-11-01 MED ORDER — FLUTICASONE FUROATE-VILANTEROL 100-25 MCG/INH IN AEPB: 1.0000 | INHALATION_SPRAY | Freq: Every day | RESPIRATORY_TRACT | Status: DC

## 2015-11-01 NOTE — Patient Instructions (Signed)
Please start taking claritin, zyrtec or allegra daily.  Please use nasal saline in your nose daily.  Please start taking the zpak if you are not improving in the next 3 days.  Please use cough syrup as needed up to 3 times daily.  Please take breo once daily.  Please wash your mouth out after you use it.  Please take flonase 2 sprays per nostril daily.  Please call the office if you are not improving.  Please make sure you are taking the jardiance daily.

## 2015-11-01 NOTE — Progress Notes (Signed)
HPI  Patient presents to the office for evaluation of cough.  It has been going on for 5 days.  Patient reports night > day, wet, worse with lying down.  They also endorse change in voice, postnasal drip and nasal congestion, rhinorrhea clear, sore throat, puffy eyes, itchy watery eyes.  .  They have tried chloraceden HBP, and robitussin.  They report that nothing has worked.  They denies other sick contacts.  He has had similar stuff like this several weeks ago.  He does tend to have seasonal allergies.    Review of Systems  Constitutional: Positive for malaise/fatigue. Negative for fever and chills.  HENT: Positive for congestion and sore throat. Negative for ear pain.   Respiratory: Positive for cough. Negative for sputum production, shortness of breath and wheezing.   Cardiovascular: Negative for chest pain, palpitations and leg swelling.  Neurological: Negative for headaches.    PE:  Filed Vitals:   11/01/15 1113  BP: 150/90  Pulse: 88  Temp: 97.9 F (36.6 C)  Resp: 16   General:  Alert and non-toxic, WDWN, NAD HEENT: NCAT, PERLA, EOM normal, no occular discharge or erythema.  Nasal mucosal edema with sinus tenderness to palpation.  Oropharynx clear with minimal oropharyngeal edema and erythema.  Mucous membranes moist and pink. Neck:  Cervical adenopathy Chest:  RRR no MRGs.  Lungs clear to auscultation A&P with no wheezes rhonchi or rales.   Abdomen: +BS x 4 quadrants, soft, non-tender, no guarding, rigidity, or rebound. Skin: warm and dry no rash Neuro: A&Ox4, CN II-XII grossly intact  Assessment and Plan:   1. Allergic rhinitis, unspecified allergic rhinitis type -avoid prednisone use secondary to elevated blood sugars -nasal saline -daily antihistamine -likely allergic rhinitis - azithromycin (ZITHROMAX Z-PAK) 250 MG tablet; 2 po day one, then 1 daily x 4 days  Dispense: 6 tablet; Refill: 0 - fluticasone furoate-vilanterol (BREO ELLIPTA) 100-25 MCG/INH AEPB; Inhale 1  puff into the lungs daily.  Dispense: 1 each; Refill: 0 - fluticasone (FLONASE) 50 MCG/ACT nasal spray; Place 2 sprays into both nostrils daily.  Dispense: 16 g; Refill: 0 - promethazine-dextromethorphan (PROMETHAZINE-DM) 6.25-15 MG/5ML syrup; Take 5-10 mL PO q8hrs prn for cold symptoms  Dispense: 360 mL; Refill: 1

## 2015-11-21 ENCOUNTER — Other Ambulatory Visit: Payer: Self-pay | Admitting: Internal Medicine

## 2015-11-30 ENCOUNTER — Other Ambulatory Visit: Payer: Self-pay | Admitting: Internal Medicine

## 2015-11-30 DIAGNOSIS — E349 Endocrine disorder, unspecified: Secondary | ICD-10-CM

## 2015-12-01 NOTE — Telephone Encounter (Signed)
Rx called into [facility] 

## 2015-12-27 ENCOUNTER — Encounter: Payer: Self-pay | Admitting: Internal Medicine

## 2015-12-27 ENCOUNTER — Ambulatory Visit (INDEPENDENT_AMBULATORY_CARE_PROVIDER_SITE_OTHER): Payer: Managed Care, Other (non HMO) | Admitting: Internal Medicine

## 2015-12-27 VITALS — BP 132/84 | HR 80 | Temp 97.7°F | Resp 16 | Ht 68.5 in | Wt 254.2 lb

## 2015-12-27 DIAGNOSIS — E559 Vitamin D deficiency, unspecified: Secondary | ICD-10-CM | POA: Diagnosis not present

## 2015-12-27 DIAGNOSIS — E1122 Type 2 diabetes mellitus with diabetic chronic kidney disease: Secondary | ICD-10-CM | POA: Diagnosis not present

## 2015-12-27 DIAGNOSIS — K219 Gastro-esophageal reflux disease without esophagitis: Secondary | ICD-10-CM

## 2015-12-27 DIAGNOSIS — E291 Testicular hypofunction: Secondary | ICD-10-CM

## 2015-12-27 DIAGNOSIS — N183 Chronic kidney disease, stage 3 unspecified: Secondary | ICD-10-CM

## 2015-12-27 DIAGNOSIS — E782 Mixed hyperlipidemia: Secondary | ICD-10-CM | POA: Diagnosis not present

## 2015-12-27 DIAGNOSIS — Z79899 Other long term (current) drug therapy: Secondary | ICD-10-CM | POA: Diagnosis not present

## 2015-12-27 DIAGNOSIS — I10 Essential (primary) hypertension: Secondary | ICD-10-CM | POA: Diagnosis not present

## 2015-12-27 LAB — LIPID PANEL
Cholesterol: 148 mg/dL (ref 125–200)
HDL: 28 mg/dL — ABNORMAL LOW (ref >=40)
LDL Cholesterol: 72 mg/dL (ref <130)
Total CHOL/HDL Ratio: 5.3 Ratio — ABNORMAL HIGH (ref <=5.0)
Triglycerides: 241 mg/dL — ABNORMAL HIGH (ref <150)
VLDL: 48 mg/dL — ABNORMAL HIGH (ref <30)

## 2015-12-27 LAB — BASIC METABOLIC PANEL WITH GFR
BUN: 14 mg/dL (ref 7–25)
CO2: 25 mmol/L (ref 20–31)
Calcium: 9.3 mg/dL (ref 8.6–10.3)
Chloride: 97 mmol/L — ABNORMAL LOW (ref 98–110)
Creat: 0.97 mg/dL (ref 0.70–1.25)
GFR, Est African American: >89 mL/min (ref >=60)
GFR, Est Non African American: 80 mL/min (ref >=60)
Glucose, Bld: 167 mg/dL — ABNORMAL HIGH (ref 65–99)
Potassium: 3.9 mmol/L (ref 3.5–5.3)
Sodium: 137 mmol/L (ref 135–146)

## 2015-12-27 LAB — CBC WITH DIFFERENTIAL/PLATELET
Basophils Absolute: 0 cells/uL (ref 0–200)
Basophils Relative: 0 %
Eosinophils Absolute: 192 cells/uL (ref 15–500)
Eosinophils Relative: 2 %
HCT: 53.4 % — ABNORMAL HIGH (ref 38.5–50.0)
Hemoglobin: 18.5 g/dL — ABNORMAL HIGH (ref 13.2–17.1)
Lymphocytes Relative: 27 %
Lymphs Abs: 2592 cells/uL (ref 850–3900)
MCH: 28.7 pg (ref 27.0–33.0)
MCHC: 34.6 g/dL (ref 32.0–36.0)
MCV: 82.8 fL (ref 80.0–100.0)
MPV: 9.3 fL (ref 7.5–12.5)
Monocytes Absolute: 672 cells/uL (ref 200–950)
Monocytes Relative: 7 %
Neutro Abs: 6144 cells/uL (ref 1500–7800)
Neutrophils Relative %: 64 %
Platelets: 147 10*3/uL (ref 140–400)
RBC: 6.45 MIL/uL — ABNORMAL HIGH (ref 4.20–5.80)
RDW: 14.7 % (ref 11.0–15.0)
WBC: 9.6 10*3/uL (ref 3.8–10.8)

## 2015-12-27 LAB — HEPATIC FUNCTION PANEL
ALT: 17 U/L (ref 9–46)
AST: 13 U/L (ref 10–35)
Albumin: 3.9 g/dL (ref 3.6–5.1)
Alkaline Phosphatase: 49 U/L (ref 40–115)
Bilirubin, Direct: 0.2 mg/dL (ref <=0.2)
Indirect Bilirubin: 0.5 mg/dL (ref 0.2–1.2)
Total Bilirubin: 0.7 mg/dL (ref 0.2–1.2)
Total Protein: 6 g/dL — ABNORMAL LOW (ref 6.1–8.1)

## 2015-12-27 LAB — HEMOGLOBIN A1C
Hgb A1c MFr Bld: 9.2 % — ABNORMAL HIGH (ref <5.7)
Mean Plasma Glucose: 217 mg/dL

## 2015-12-27 LAB — TSH: TSH: 2.12 mIU/L (ref 0.40–4.50)

## 2015-12-27 LAB — MAGNESIUM: Magnesium: 1.4 mg/dL — ABNORMAL LOW (ref 1.5–2.5)

## 2015-12-27 NOTE — Patient Instructions (Signed)

## 2015-12-27 NOTE — Progress Notes (Signed)
Patient ID: Phillip Powell, male   DOB: 08/10/1945, 70 y.o.   MRN: [id number]  [facility]                       Unk Pinto, M.D.        Uvaldo Bristle. Silverio Lay, P.A.-C       Starlyn Skeans, P.A.-C   [facility]                [address] [place], [place] [id number] Telephone [phone] Telefax [phone] _________________________________________________________________________     This very nice 70 y.o. 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 Patient also is on CPAP for OSA.      Patient is treated for HTN circa 1984 & BP has been controlled at home. Today's BP: 132/84 mmHg. Patient has had no complaints of any cardiac type chest pain, palpitations, dyspnea/orthopnea/PND, dizziness, claudication, or dependent edema.     Hyperlipidemia is controlled with diet & meds. Patient denies myalgias or other med SE's. Last Lipids were at goal with  Cholesterol 137; HDL 28*; LDL 68; Triglycerides 205 on 09/20/2015.     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  Patient has chronically been poorly compliant and also reports that he self-d/c'd his Trulicity for concern of possible cancer as implied by the lawyer TV solicitations. Last A1c was not at goal with 8.4% on 09/20/2015      Further, the patient also has history of Vitamin D Deficiency of "37" in 2008 and supplements vitamin D without any suspected side-effects. Last vitamin D was still very low at 26 on 05/24/2015.     Medication Sig  . atenolol  100 MG take 1 tablet by mouth once daily  . FLONASE nasal spray Place 2 sprays into both nostrils daily.  Marland Kitchen BREO ELLIPTA 100-25 Inhale 1 puff into the lungs daily.  Marland Kitchen glyBURIDE  5 MG   TAKE 1/2 TO 1 TABLET BY MOUTH 3 TIMES A DAY AFTER MEALS  . hctz 25 MG  take 1 tablet by mouth every morning for blood pressure  . JANUMET 50-500 MG  take 1 tablet by mouth twice a day with food  . Lancets MISC Patient to check blood sugar once daily  . losartan  100 MG  take 1 tablet by mouth once daily  . Magnesium 250 MG  Take by mouth 2 (two) times daily.  . meclizine  25 MG Take 1-2 tablets 3 times daily as needed for dizziness and nausea.  . meloxicam  15 MG  take 1 tablet by mouth once daily with food  . ranitidine  300 MG  take 1 capsule by mouth once daily  . testosterone cypionate  200 MG/ML inj INJECT 2 MILLILITERS INTO THE MUSCLE EVERY 14 DAYS  . TRULICITY 1.5 0000000 INJECT 1 PEN SUBCUTANEOUSLY ONCE A WEEK   Allergies  Allergen Reactions  . Ace Inhibitors   . Ibuprofen     GI upset  . Invokana [Canagliflozin]   . Metformin And Related     Diarrhea  . Quinidine    PMHx:   Past Medical History  Diagnosis  Date  . Hypertension   . Diabetes mellitus without complication ([place])   . GERD (gastroesophageal reflux disease)   . Hypogonadism male   . OSA (obstructive sleep apnea)   . Vitamin D deficiency   . BPH (benign prostatic hypertrophy)    Immunization History  Administered Date(s) Administered  . Influenza Split 06/23/2013  . Influenza, High Dose Seasonal PF 06/29/2014, 05/24/2015  . Pneumococcal Conjugate-13 01/05/2014  . Pneumococcal-Unspecified 07/24/1994  . Td 06/23/2002  . Zoster 10/23/2010   Past Surgical History  Procedure Laterality Date  . Tonsillectomy  1952  . Rotator cuff repair Left 1996  . Rotator cuff repair Right 1997  . Hernia repair  [id number]    umbilical   FHx:    Reviewed / unchanged  SHx:    Reviewed / unchanged  Systems Review:  Constitutional: Denies fever, chills, wt changes, headaches, insomnia, fatigue, night sweats, change in appetite. Eyes: Denies redness, blurred vision, diplopia, discharge, itchy, watery eyes.  ENT: Denies  discharge, congestion, post nasal drip, epistaxis, sore throat, earache, hearing loss, dental pain, tinnitus, vertigo, sinus pain, snoring.  CV: Denies chest pain, palpitations, irregular heartbeat, syncope, dyspnea, diaphoresis, orthopnea, PND, claudication or edema. Respiratory: denies cough, dyspnea, DOE, pleurisy, hoarseness, laryngitis, wheezing.  Gastrointestinal: Denies dysphagia, odynophagia, heartburn, reflux, water brash, abdominal pain or cramps, nausea, vomiting, bloating, diarrhea, constipation, hematemesis, melena, hematochezia  or hemorrhoids. Genitourinary: Denies dysuria, frequency, urgency, nocturia, hesitancy, discharge, hematuria or flank pain. Musculoskeletal: Denies arthralgias, myalgias, stiffness, jt. swelling, pain, limping or strain/sprain.  Skin: Denies pruritus, rash, hives, warts, acne, eczema or change in skin lesion(s). Neuro: No weakness, tremor, incoordination, spasms, paresthesia or pain. Psychiatric: Denies confusion, memory loss or sensory loss. Endo: Denies change in weight, skin or hair change.  Heme/Lymph: No excessive bleeding, bruising or enlarged lymph nodes.  Physical Exam  BP 132/84 mmHg  Pulse 80  Temp(Src) 97.7 F (36.5 C)  Resp 16  Ht 5' 8.5" (1.74 m)  Wt 254 lb 3.2 oz (115.304 kg)  BMI 38.08 kg/m2  Appears well nourished and in no distress. Eyes: PERRLA, EOMs, conjunctiva no swelling or erythema. Sinuses: No frontal/maxillary tenderness ENT/Mouth: EAC's clear, TM's nl w/o erythema, bulging. Nares clear w/o erythema, swelling, exudates. Oropharynx clear without erythema or exudates. Oral hygiene is good. Tongue normal, non obstructing. Hearing intact.  Neck: Supple. Thyroid nl. Car 2+/2+ without bruits, nodes or JVD. Chest: Respirations nl with BS clear & equal w/o rales, rhonchi, wheezing or stridor.  Cor: Heart sounds normal w/ regular rate and rhythm without sig. murmurs, gallops, clicks, or rubs. Peripheral pulses normal and equal   without edema.  Abdomen: Soft & bowel sounds normal. Non-tender w/o guarding, rebound, hernias, masses, or organomegaly.  Lymphatics: Unremarkable.  Musculoskeletal: Full ROM all peripheral extremities, joint stability, 5/5 strength, and normal gait.  Skin: Warm, dry without exposed rashes, lesions or ecchymosis apparent.  Neuro: Cranial nerves intact, reflexes equal bilaterally. Sensory-motor testing grossly intact. Tendon reflexes grossly intact.  Pysch: Alert & oriented x 3.  Insight and judgement nl & appropriate. No ideations.  Assessment and Plan:  1. Essential hypertension  - TSH  2. Hyperlipidemia  - Lipid panel - TSH  3. 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 Vitamin D deficiency  - VITAMIN D 25 Hydroxy   5. Testosterone Deficiency  - Testosterone  6. Gastroesophageal reflux disease   7. Medication management  - CBC with Differential/Platelet - BASIC METABOLIC PANEL WITH GFR - Hepatic function panel -  Magnesium    Recommended regular exercise, BP monitoring, weight control, and discussed med and SE's. Recommended labs to assess and monitor clinical status. Further disposition pending results of labs. Over 30 minutes of exam, counseling, chart review was performed

## 2015-12-28 LAB — TESTOSTERONE: Testosterone: 1201 ng/dL — ABNORMAL HIGH (ref 250–827)

## 2015-12-28 LAB — INSULIN, RANDOM: Insulin: 16.5 u[IU]/mL (ref 2.0–19.6)

## 2015-12-28 LAB — VITAMIN D 25 HYDROXY (VIT D DEFICIENCY, FRACTURES): Vit D, 25-Hydroxy: 34 ng/mL (ref 30–100)

## 2016-01-26 ENCOUNTER — Other Ambulatory Visit: Payer: Self-pay | Admitting: Internal Medicine

## 2016-02-03 ENCOUNTER — Other Ambulatory Visit: Payer: Self-pay | Admitting: Internal Medicine

## 2016-03-06 ENCOUNTER — Other Ambulatory Visit: Payer: Self-pay | Admitting: Physician Assistant

## 2016-04-17 ENCOUNTER — Ambulatory Visit: Payer: Self-pay | Admitting: Physician Assistant

## 2016-04-24 ENCOUNTER — Ambulatory Visit (INDEPENDENT_AMBULATORY_CARE_PROVIDER_SITE_OTHER): Payer: Managed Care, Other (non HMO) | Admitting: Physician Assistant

## 2016-04-24 ENCOUNTER — Encounter: Payer: Self-pay | Admitting: Physician Assistant

## 2016-04-24 VITALS — BP 136/80 | HR 70 | Temp 97.7°F | Resp 16 | Ht 68.5 in | Wt 254.6 lb

## 2016-04-24 DIAGNOSIS — Z6841 Body Mass Index (BMI) 40.0 and over, adult: Secondary | ICD-10-CM | POA: Diagnosis not present

## 2016-04-24 DIAGNOSIS — E1122 Type 2 diabetes mellitus with diabetic chronic kidney disease: Secondary | ICD-10-CM

## 2016-04-24 DIAGNOSIS — Z87898 Personal history of other specified conditions: Secondary | ICD-10-CM | POA: Diagnosis not present

## 2016-04-24 DIAGNOSIS — E559 Vitamin D deficiency, unspecified: Secondary | ICD-10-CM

## 2016-04-24 DIAGNOSIS — N183 Chronic kidney disease, stage 3 unspecified: Secondary | ICD-10-CM

## 2016-04-24 DIAGNOSIS — Z79899 Other long term (current) drug therapy: Secondary | ICD-10-CM

## 2016-04-24 DIAGNOSIS — R05 Cough: Secondary | ICD-10-CM | POA: Diagnosis not present

## 2016-04-24 DIAGNOSIS — I10 Essential (primary) hypertension: Secondary | ICD-10-CM

## 2016-04-24 DIAGNOSIS — Z23 Encounter for immunization: Secondary | ICD-10-CM | POA: Diagnosis not present

## 2016-04-24 DIAGNOSIS — R059 Cough, unspecified: Secondary | ICD-10-CM

## 2016-04-24 DIAGNOSIS — E782 Mixed hyperlipidemia: Secondary | ICD-10-CM | POA: Diagnosis not present

## 2016-04-24 LAB — CBC WITH DIFFERENTIAL/PLATELET
Basophils Absolute: 93 cells/uL (ref 0–200)
Basophils Relative: 1 %
Eosinophils Absolute: 186 cells/uL (ref 15–500)
Eosinophils Relative: 2 %
HCT: 52.7 % — ABNORMAL HIGH (ref 38.5–50.0)
Hemoglobin: 18.2 g/dL — ABNORMAL HIGH (ref 13.2–17.1)
Lymphocytes Relative: 29 %
Lymphs Abs: 2697 cells/uL (ref 850–3900)
MCH: 28.4 pg (ref 27.0–33.0)
MCHC: 34.5 g/dL (ref 32.0–36.0)
MCV: 82.3 fL (ref 80.0–100.0)
MPV: 9.5 fL (ref 7.5–12.5)
Monocytes Absolute: 930 cells/uL (ref 200–950)
Monocytes Relative: 10 %
Neutro Abs: 5394 cells/uL (ref 1500–7800)
Neutrophils Relative %: 58 %
Platelets: 172 10*3/uL (ref 140–400)
RBC: 6.4 MIL/uL — ABNORMAL HIGH (ref 4.20–5.80)
RDW: 14.5 % (ref 11.0–15.0)
WBC: 9.3 10*3/uL (ref 3.8–10.8)

## 2016-04-24 LAB — HEPATIC FUNCTION PANEL
ALT: 15 U/L (ref 9–46)
AST: 16 U/L (ref 10–35)
Albumin: 4 g/dL (ref 3.6–5.1)
Alkaline Phosphatase: 50 U/L (ref 40–115)
Bilirubin, Direct: 0.2 mg/dL (ref <=0.2)
Indirect Bilirubin: 0.7 mg/dL (ref 0.2–1.2)
Total Bilirubin: 0.9 mg/dL (ref 0.2–1.2)
Total Protein: 6.1 g/dL (ref 6.1–8.1)

## 2016-04-24 LAB — BASIC METABOLIC PANEL WITH GFR
BUN: 14 mg/dL (ref 7–25)
CO2: 26 mmol/L (ref 20–31)
Calcium: 9.9 mg/dL (ref 8.6–10.3)
Chloride: 98 mmol/L (ref 98–110)
Creat: 1.19 mg/dL (ref 0.70–1.25)
GFR, Est African American: 72 mL/min (ref >=60)
GFR, Est Non African American: 62 mL/min (ref >=60)
Glucose, Bld: 132 mg/dL — ABNORMAL HIGH (ref 65–99)
Potassium: 4 mmol/L (ref 3.5–5.3)
Sodium: 135 mmol/L (ref 135–146)

## 2016-04-24 LAB — LIPID PANEL
Cholesterol: 150 mg/dL (ref 125–200)
HDL: 26 mg/dL — ABNORMAL LOW (ref >=40)
LDL Cholesterol: 85 mg/dL (ref <130)
Total CHOL/HDL Ratio: 5.8 Ratio — ABNORMAL HIGH (ref <=5.0)
Triglycerides: 197 mg/dL — ABNORMAL HIGH (ref <150)
VLDL: 39 mg/dL — ABNORMAL HIGH (ref <30)

## 2016-04-24 LAB — TSH: TSH: 3.48 mIU/L (ref 0.40–4.50)

## 2016-04-24 NOTE — Progress Notes (Signed)
Patient ID: Phillip Powell, male   DOB: 06/01/1946, 70 y.o.   MRN: [id number]  Assessment and Plan:  Hypertension:  -Continue medication -monitor blood pressure at home. -Continue DASH diet -Reminder to go to the ER if any CP, SOB, nausea, dizziness, severe HA, changes vision/speech, left arm numbness and tingling and jaw pain.  Cholesterol - Continue diet and exercise -Check cholesterol.   Diabetes with diabetic chronic kidney disease  -Continue diet and exercise.  -Check A1C  Vitamin D Def -check level -continue medications.   Morbid Obesity with co morbidities - long discussion about weight loss, diet, and exercise  Back pain Likely musculoskeletal, heat, rest, mobic, has had cough x 1 year, get CXR No urinary symptoms, no rash, no GERD/neg murphys  Continue diet and meds as discussed. Further disposition pending results of labs. Discussed med's effects and SE's.    HPI 70 y.o. male  presents for 3 month follow up with hypertension, hyperlipidemia, diabetes and vitamin D deficiency.  Patient complains of right sided back pain under shoulder blade, nonexertional, no accompaniments, has had cough x 1 year non productive, no GERD. 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    His blood pressure has been controlled at home, today their BP is BP: 136/80.He does not workout. He denies chest pain, shortness of breath, dizziness.    He is on cholesterol medication and denies myalgias. His cholesterol is at goal. 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 Katy Fitch will try to get report, he is on bASA, he is on ACE/ARB, and denies  foot  ulcerations, hyperglycemia, hypoglycemia , increased appetite, nausea, paresthesia of the feet, polydipsia, polyuria, visual disturbances, vomiting and weight loss. Last A1C was: 12/27/2015: Hgb A1c MFr Bld 9.2 .     Patient is on Vitamin D supplement. 12/27/2015: Vit D, 25-Hydroxy 34  BMI is Body mass index is 38.15 kg/m., he is working on diet and exercise. Wt Readings from Last 3 Encounters:  04/24/16 254 lb 9.6 oz (115.5 kg)  12/27/15 254 lb 3.2 oz (115.3 kg)  11/01/15 258 lb 9.6 oz (117.3 kg)   He has a history of testosterone deficiency and is on testosterone replacement. He states that the testosterone helps with his energy, libido, muscle mass. Lab Results  Component Value Date   TESTOSTERONE 1,201 (H) 12/27/2015    Current Medications:  Current Outpatient Prescriptions on File Prior to Visit  Medication Sig Dispense Refill  . atenolol (TENORMIN) 100 MG tablet take 1 tablet by mouth once daily 90 tablet 3  . B-D 3CC LUER-LOK SYR 21GX1" 21G X 1" 3 ML MISC USE AS DIRECTED 12 each 1  . Blood Glucose Monitoring Suppl (ONETOUCH VERIO IQ SYSTEM) W/DEVICE KIT Patient test blood sugar once daily 1 kit 0  . fluticasone (FLONASE) 50 MCG/ACT nasal spray instill 2 sprays into each nostril once daily  0  . glucose blood (ONETOUCH VERIO) test strip Patient to check blood sugar once daily 100 each 12  . glyBURIDE (DIABETA) 5 MG tablet take 1/2 to 1 tablet by mouth three times a day AFTER MEALS 270 tablet  0  . hydrochlorothiazide (HYDRODIURIL) 25 MG tablet TAKE 1 TABLET BY MOUTH EVERY MORNING FOR BLOOD PRESSURE 90 tablet 1  . JANUMET 50-500 MG tablet take 1 tablet by mouth twice a day with food 180 tablet 1  . Lancets MISC Patient to check blood sugar once daily 100 each 12  . losartan (COZAAR) 100 MG tablet take 1 tablet by mouth once daily 90 tablet 1  . Magnesium 250 MG TABS Take by mouth 2 (two) times daily.    . meclizine (ANTIVERT) 25 MG tablet Take 1-2 tablets 3 times daily as needed for  dizziness and nausea. 50 tablet 0  . meloxicam (MOBIC) 15 MG tablet take 1 tablet by mouth once daily with food 90 tablet 1  . ranitidine (ZANTAC) 300 MG capsule take 1 capsule by mouth once daily 90 capsule 1  . testosterone cypionate (DEPOTESTOSTERONE CYPIONATE) 200 MG/ML injection INJECT 2 MILLILITERS INTO THE MUSCLE EVERY 14 DAYS 10 mL 2   No current facility-administered medications on file prior to visit.    Medical History:  Past Medical History:  Diagnosis Date  . BPH (benign prostatic hypertrophy)   . Diabetes mellitus without complication ([place])   . GERD (gastroesophageal reflux disease)   . Hypertension   . Hypogonadism male   . Phillip (obstructive sleep apnea)   . Vitamin D deficiency    Allergies:  Allergies  Allergen Reactions  . Ace Inhibitors   . Ibuprofen     GI upset  . Invokana [Canagliflozin]   . Metformin And Related     Diarrhea  . Quinidine      Review of Systems:  Review of Systems  Constitutional: Negative for chills, fever and malaise/fatigue.  HENT: Negative for congestion, ear pain and sore throat.   Eyes: Negative.   Respiratory: Positive for cough. Negative for shortness of breath and wheezing.   Cardiovascular: Negative for chest pain, palpitations and leg swelling.  Gastrointestinal: Negative for abdominal pain, blood in stool, constipation, diarrhea, heartburn and melena.  Genitourinary: Negative.   Musculoskeletal: Positive for back pain.  Skin: Negative.   Neurological: Negative for dizziness, sensory change, loss of consciousness and headaches.  Psychiatric/Behavioral: Negative for depression. The patient is not nervous/anxious and does not have insomnia.     Family history- Review and unchanged  Social history- Review and unchanged  Physical Exam: BP 136/80   Pulse 70   Temp 97.7 F (36.5 C)   Resp 16   Ht 5' 8.5" (1.74 m)   Wt 254 lb 9.6 oz (115.5 kg)   SpO2 98%   BMI 38.15 kg/m  Wt Readings from Last 3 Encounters:   04/24/16 254 lb 9.6 oz (115.5 kg)  12/27/15 254 lb 3.2 oz (115.3 kg)  11/01/15 258 lb 9.6 oz (117.3 kg)   General Appearance: Well nourished well developed, non-toxic appearing, in no apparent distress. Eyes: PERRLA, EOMs, conjunctiva no swelling or erythema ENT/Mouth: Ear canals clear with no erythema, swelling, or discharge.  TMs normal bilaterally, oropharynx clear, moist, with no exudate.   Neck: Supple, thyroid normal, no JVD, no cervical adenopathy.  Respiratory: Respiratory effort normal, breath sounds clear A&P, no wheeze, rhonchi or rales noted.  No retractions, no accessory muscle usage Cardio: RRR with no MRGs. No noted edema.  Abdomen: Soft, + BS.  Non tender, negative murphy's, no guarding, rebound, hernias, masses. Musculoskeletal: Full ROM, 5/5 strength, Normal gait, + pain to palpation right lower back, pain with twisting, no CVA tenderness, no rash.  Skin:  Warm, dry without rashes, lesions, ecchymosis.  Neuro: Awake and oriented X 3, Cranial nerves intact. No cerebellar symptoms.  Psych: normal affect, Insight and Judgment appropriate.    Vicie Mutters, PA-C 9:31 AM [facility]

## 2016-04-24 NOTE — Patient Instructions (Signed)
Diabetes is a very complicated disease...lets simplify it.  An easy way to look at it to understand the complications is if you think of the extra sugar floating in your blood stream as glass shards floating through your blood stream.    Diabetes affects your small vessels first: 1) The glass shards (sugar) scraps down the tiny blood vessels in your eyes and lead to diabetic retinopathy, the leading cause of blindness in the [place]. Diabetes is the leading cause of newly diagnosed adult (20 to 69 years of age) blindness in the [place].  2) The glass shards scratches down the tiny vessels of your legs leading to nerve damage called neuropathy and can lead to amputations of your feet. More than 60% of all non-traumatic amputations of lower limbs occur in people with diabetes.  3) Over time the small vessels in your brain are shredded and closed off, individually this does not cause any problems but over a long period of time many of the small vessels being blocked can lead to Vascular Dementia.   4) Your kidney's are a filter system and have a "net" that keeps certain things in the body and lets bad things out. Sugar shreds this net and leads to kidney damage and eventually failure. Decreasing the sugar that is destroying the net and certain blood pressure medications can help stop or decrease progression of kidney disease. Diabetes was the primary cause of kidney failure in 44 percent of all new cases in 2011.  5) Diabetes also destroys the small vessels in your penis that lead to erectile dysfunction. Eventually the vessels are so damaged that you may not be responsive to cialis or viagra.   Diabetes and your large vessels: Your larger vessels consist of your coronary arteries in your heart and the carotid vessels to your brain. Diabetes or even increased sugars put you at 300% increased risk of heart attack and stroke and this is why.. The sugar scrapes down your large blood vessels and your body  sees this as an internal injury and tries to repair itself. Just like you get a scab on your skin, your platelets will stick to the blood vessel wall trying to heal it. This is why we have diabetics on low dose aspirin daily, this prevents the platelets from sticking and can prevent plaque formation. In addition, your body takes cholesterol and tries to shove it into the open wound. This is why we want your LDL, or bad cholesterol, below 70.   The combination of platelets and cholesterol over 5-10 years forms plaque that can break off and cause a heart attack or stroke.   PLEASE REMEMBER:  Diabetes is preventable! Up to 85 percent of complications and morbidities among individuals with type 2 diabetes can be prevented, delayed, or effectively treated and minimized with regular visits to a health professional, appropriate monitoring and medication, and a healthy diet and lifestyle.     Bad carbs also include fruit juice, alcohol, and sweet tea. These are empty calories that do not signal to your brain that you are full.   Please remember the good carbs are still carbs which convert into sugar. So please measure them out no more than 1/2-1 cup of rice, oatmeal, pasta, and beans  Veggies are however free foods! Pile them on.   Not all fruit is created equal. Please see the list below, the fruit at the bottom is higher in sugars than the fruit at the top. Please avoid all dried fruits.     

## 2016-04-25 LAB — HEMOGLOBIN A1C
Hgb A1c MFr Bld: 9 % — ABNORMAL HIGH (ref <5.7)
Mean Plasma Glucose: 212 mg/dL

## 2016-05-28 ENCOUNTER — Encounter: Payer: Self-pay | Admitting: *Deleted

## 2016-06-18 ENCOUNTER — Other Ambulatory Visit: Payer: Self-pay | Admitting: Internal Medicine

## 2016-06-18 DIAGNOSIS — E349 Endocrine disorder, unspecified: Secondary | ICD-10-CM

## 2016-06-22 ENCOUNTER — Encounter: Payer: Self-pay | Admitting: Internal Medicine

## 2016-07-16 ENCOUNTER — Encounter (HOSPITAL_COMMUNITY): Payer: Self-pay | Admitting: Emergency Medicine

## 2016-07-16 ENCOUNTER — Emergency Department (HOSPITAL_COMMUNITY): Payer: Managed Care, Other (non HMO)

## 2016-07-16 ENCOUNTER — Emergency Department (HOSPITAL_COMMUNITY)
Admission: EM | Admit: 2016-07-16 | Discharge: 2016-07-16 | Disposition: A | Discharge status: Home / Self Care | Payer: Managed Care, Other (non HMO) | Attending: Emergency Medicine | Admitting: Emergency Medicine

## 2016-07-16 DIAGNOSIS — E1122 Type 2 diabetes mellitus with diabetic chronic kidney disease: Secondary | ICD-10-CM | POA: Diagnosis not present

## 2016-07-16 DIAGNOSIS — I129 Hypertensive chronic kidney disease with stage 1 through stage 4 chronic kidney disease, or unspecified chronic kidney disease: Secondary | ICD-10-CM | POA: Insufficient documentation

## 2016-07-16 DIAGNOSIS — R1031 Right lower quadrant pain: Secondary | ICD-10-CM | POA: Diagnosis present

## 2016-07-16 DIAGNOSIS — Z7984 Long term (current) use of oral hypoglycemic drugs: Secondary | ICD-10-CM | POA: Insufficient documentation

## 2016-07-16 DIAGNOSIS — N132 Hydronephrosis with renal and ureteral calculous obstruction: Secondary | ICD-10-CM | POA: Insufficient documentation

## 2016-07-16 DIAGNOSIS — N200 Calculus of kidney: Secondary | ICD-10-CM

## 2016-07-16 DIAGNOSIS — N201 Calculus of ureter: Secondary | ICD-10-CM

## 2016-07-16 DIAGNOSIS — N183 Chronic kidney disease, stage 3 (moderate): Secondary | ICD-10-CM | POA: Diagnosis not present

## 2016-07-16 LAB — CBC
HCT: 55.4 % — ABNORMAL HIGH (ref 39.0–52.0)
Hemoglobin: 19.5 g/dL — ABNORMAL HIGH (ref 13.0–17.0)
MCH: 28.9 pg (ref 26.0–34.0)
MCHC: 35.2 g/dL (ref 30.0–36.0)
MCV: 82.2 fL (ref 78.0–100.0)
Platelets: 143 10*3/uL — ABNORMAL LOW (ref 150–400)
RBC: 6.74 MIL/uL — ABNORMAL HIGH (ref 4.22–5.81)
RDW: 13.8 % (ref 11.5–15.5)
WBC: 10.9 10*3/uL — ABNORMAL HIGH (ref 4.0–10.5)

## 2016-07-16 LAB — COMPREHENSIVE METABOLIC PANEL
ALT: 23 U/L (ref 17–63)
AST: 24 U/L (ref 15–41)
Albumin: 3.9 g/dL (ref 3.5–5.0)
Alkaline Phosphatase: 49 U/L (ref 38–126)
Anion gap: 8 (ref 5–15)
BUN: 14 mg/dL (ref 6–20)
CO2: 25 mmol/L (ref 22–32)
Calcium: 9.3 mg/dL (ref 8.9–10.3)
Chloride: 100 mmol/L — ABNORMAL LOW (ref 101–111)
Creatinine, Ser: 1.32 mg/dL — ABNORMAL HIGH (ref 0.61–1.24)
GFR calc Af Amer: >60 mL/min (ref >60)
GFR calc non Af Amer: 53 mL/min — ABNORMAL LOW (ref >60)
Glucose, Bld: 263 mg/dL — ABNORMAL HIGH (ref 65–99)
Potassium: 4.4 mmol/L (ref 3.5–5.1)
Sodium: 133 mmol/L — ABNORMAL LOW (ref 135–145)
Total Bilirubin: 1.2 mg/dL (ref 0.3–1.2)
Total Protein: 6.6 g/dL (ref 6.5–8.1)

## 2016-07-16 LAB — URINALYSIS, ROUTINE W REFLEX MICROSCOPIC
Bacteria, UA: NONE SEEN
Bilirubin Urine: NEGATIVE
Glucose, UA: >=500 mg/dL — AB
Ketones, ur: 5 mg/dL — AB
Leukocytes, UA: NEGATIVE
Nitrite: NEGATIVE
Protein, ur: 30 mg/dL — AB
Specific Gravity, Urine: 1.016 (ref 1.005–1.030)
pH: 5 (ref 5.0–8.0)

## 2016-07-16 LAB — LIPASE, BLOOD: Lipase: 32 U/L (ref 11–51)

## 2016-07-16 MED ORDER — OXYCODONE-ACETAMINOPHEN 5-325 MG PO TABS: 1.0000 | ORAL_TABLET | Freq: Four times a day (QID) | ORAL | 0 refills | Status: DC | PRN

## 2016-07-16 MED ORDER — ONDANSETRON HCL 4 MG PO TABS: 4.0000 mg | ORAL_TABLET | Freq: Four times a day (QID) | ORAL | 0 refills | Status: DC

## 2016-07-16 MED ORDER — TAMSULOSIN HCL 0.4 MG PO CAPS: 0.4000 mg | ORAL_CAPSULE | Freq: Every day | ORAL | 0 refills | Status: DC

## 2016-07-16 MED ORDER — SODIUM CHLORIDE 0.9 % IV BOLUS (SEPSIS)
1000.0000 mL | Freq: Once | INTRAVENOUS | Status: AC
  Administered 2016-07-16: 1000 mL via INTRAVENOUS

## 2016-07-16 MED ORDER — MORPHINE SULFATE (PF) 4 MG/ML IV SOLN
4.0000 mg | Freq: Once | INTRAVENOUS | Status: AC
  Administered 2016-07-16: 4 mg via INTRAVENOUS
  Filled 2016-07-16: qty 1

## 2016-07-16 MED ORDER — ONDANSETRON HCL 4 MG/2ML IJ SOLN
4.0000 mg | Freq: Once | INTRAMUSCULAR | Status: AC
  Administered 2016-07-16: 4 mg via INTRAVENOUS
  Filled 2016-07-16: qty 2

## 2016-07-16 NOTE — ED Provider Notes (Signed)
.   Please see previous physicians note regarding patient's presenting history and physical, initial ED course, and associated medical decision making.  Presenting with RLQ abdominal pain and RBCs in urine. Pending CT renal stone study.  CT scan visualized and shows 2 mm right ureteral stone. Patient's pain has been well-controlled. Urine without infection and kidney function appropriate. I discussed supportive care management follow-up with primary care doctor. Urology referral provided as needed.  Strict return and follow-up instructions reviewed. He expressed understanding of all discharge instructions and felt comfortable with the plan of care.    Forde Dandy, MD 07/16/16 [phone]

## 2016-07-16 NOTE — ED Notes (Signed)
Pt ambulatory at DC, verbalized understanding of DC teaching. NAD. VSS.

## 2016-07-16 NOTE — ED Notes (Signed)
ED Provider at bedside. 

## 2016-07-16 NOTE — Discharge Instructions (Signed)
Your CT report is below. Please show your primary care doctor to follow-up kidney lesion.   IMPRESSION: 1. 2 mm right proximal ureteral calculus associated with borderline right hydronephrosis and asymmetric perirenal edema on the right. I am suspicious for recent forniceal rupture is a cause for this perirenal edema and potential relief of obstruction. 2. Mildly complex exophytic lesion from the right kidney, probably a complex cyst, but enhancement characteristics are not assessed today. Accordingly neoplasm is not readily excluded. 3. 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 4. Coronary atherosclerosis and mild cardiomegaly. Aortoiliac atherosclerotic vascular disease.   Please return without fail for worsening symptoms, including escalating pain, intractable vomiting, fevers, inability to urinate, or any other symptoms concerning to you.

## 2016-07-16 NOTE — ED Provider Notes (Signed)
[facility] DEPT Provider Note   CSN: [id number] Arrival date & time: 07/16/16  1323     History   Chief Complaint Chief Complaint  Patient presents with  . Abdominal Pain    HPI Phillip Powell is a 70 y.o. male hx of BPH, DM, GERD, HTN, Here presenting with right lower quadrant pain. Patient states that he had acute onset of right lower quadrant pain this morning. Pain does not radiate anywhere. Denies any testicular pain or vomiting. He felt nauseated however. He had previous umbilical hernia repair but still has his appendix. He has no urinary symptoms. No constipation or diarrhea.    The history is provided by the patient.    Past Medical History:  Diagnosis Date  . BPH (benign prostatic hypertrophy)   . Diabetes mellitus without complication ([place])   . GERD (gastroesophageal reflux disease)   . Hypertension   . Hypogonadism male   . OSA (obstructive sleep apnea)   . Vitamin D deficiency     Patient Active Problem List   Diagnosis Date Noted  . BMI 38.0-38.9,adult 05/24/2015  . Poor compliance with Diet  10/05/2014  . Morbid obesity (BMI 36.95) 03/02/2014  . Hyperlipidemia 08/18/2013  . Medication management 08/18/2013  . Hypertension   . 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 GERD (gastroesophageal reflux disease)   . Testosterone Deficiency   . OSA (obstructive sleep apnea)   . Vitamin D deficiency   . BPH (benign prostatic hypertrophy)     Past Surgical History:  Procedure Laterality Date  . HERNIA REPAIR  7782   umbilical  . ROTATOR CUFF REPAIR Left 1996  . ROTATOR CUFF REPAIR Right 1997  . TONSILLECTOMY  1952       Home Medications    Prior to Admission medications   Medication Sig Start Date End Date Taking? Authorizing Provider  atenolol (TENORMIN) 100 MG tablet take 1 tablet by mouth once daily 08/12/15   Unk Pinto, MD  B-D 3CC LUER-LOK SYR 21GX1" 21G X 1" 3 ML MISC USE AS DIRECTED 09/30/15   Unk Pinto, MD  Blood Glucose Monitoring Suppl  (ONETOUCH VERIO IQ SYSTEM) W/DEVICE KIT Patient test blood sugar once daily 10/07/14   Vicie Mutters, PA-C  fluticasone [facility]) 50 MCG/ACT nasal spray instill 2 sprays into each nostril once daily 11/01/15   Historical Provider, MD  glucose blood (ONETOUCH VERIO) test strip Patient to check blood sugar once daily 10/07/14   Vicie Mutters, PA-C  glyBURIDE (DIABETA) 5 MG tablet take 1/2 to 1 tablet by mouth three times a day AFTER MEALS 01/26/16   Courtney Forcucci, PA-C  hydrochlorothiazide (HYDRODIURIL) 25 MG tablet TAKE 1 TABLET BY MOUTH EVERY MORNING FOR BLOOD PRESSURE 03/06/16   Unk Pinto, MD  JANUMET 50-500 MG tablet take 1 tablet by mouth twice a day with food 08/26/15   Unk Pinto, MD  Lancets MISC Patient to check blood sugar once daily 10/07/14   Vicie Mutters, PA-C  losartan (COZAAR) 100 MG tablet take 1 tablet by mouth once daily 03/06/16   Unk Pinto, MD  Magnesium 250 MG TABS Take by mouth 2 (two) times daily.    Historical Provider, MD  meclizine (ANTIVERT) 25 MG tablet Take 1-2 tablets 3 times daily as needed for dizziness and nausea. 06/10/15   Unk Pinto, MD  meloxicam (MOBIC) 15 MG tablet take 1 tablet by mouth once daily with food 02/03/16   Courtney Forcucci, PA-C  ranitidine (ZANTAC) 300 MG capsule TAKE 1 CAPSULE BY MOUTH ONCE  DAILY 06/18/16   Vicie Mutters, PA-C  testosterone cypionate (DEPOTESTOSTERONE CYPIONATE) 200 MG/ML injection INJECT 2 MILLILITERS INTO THE MUSCLE EVERY 14 DAYS 06/18/16   Vicie Mutters, PA-C    Family History Family History  Problem Relation Age of Onset  . Cancer Mother     throat  . Diabetes Mother   . Thyroid disease Father   . Cancer Father     lung  . Thyroid disease Sister   . Hypertension Son   . Hyperlipidemia Son   . Cancer Paternal Grandmother     colon    Social History Social History  Substance Use Topics  . Smoking status: Never Smoker  . Smokeless tobacco: Not on file  . Alcohol use Yes     Comment: very  rarely     Allergies   Ace inhibitors; Ibuprofen; Invokana [canagliflozin]; Metformin and related; and Quinidine   Review of Systems Review of Systems  Gastrointestinal: Positive for abdominal pain.  All other systems reviewed and are negative.    Physical Exam Updated Vital Signs BP 174/89 (BP Location: Right Arm)   Pulse 72   Temp 98.4 F (36.9 C) (Oral)   Resp 18   SpO2 95%   Physical Exam  Constitutional: He is oriented to person, place, and time. He appears well-developed.  Overweight   HENT:  Head: Normocephalic.  Eyes: EOM are normal. Pupils are equal, round, and reactive to light.  Neck: Normal range of motion. Neck supple.  Cardiovascular: Normal rate, regular rhythm and normal heart sounds.   Pulmonary/Chest: Effort normal and breath sounds normal. No respiratory distress. He has no wheezes. He has no rales.  Abdominal: Soft. Bowel sounds are normal.  Mild RLQ tenderness. No obvious inguinal hernia. Umbilical hernia surgical site healing well, no obvious recurrent hernia   Genitourinary:  Genitourinary Comments: No testicular tenderness   Musculoskeletal: Normal range of motion.  Neurological: He is alert and oriented to person, place, and time.  Skin: Skin is warm.  Psychiatric: He has a normal mood and affect.  Nursing note and vitals reviewed.    ED Treatments / Results  Labs (all labs ordered are listed, but only abnormal results are displayed) Labs Reviewed  CBC - Abnormal; Notable for the following:       Result Value   WBC 10.9 (*)    RBC 6.74 (*)    Hemoglobin 19.5 (*)    HCT 55.4 (*)    Platelets 143 (*)    All other components within normal limits  LIPASE, BLOOD  COMPREHENSIVE METABOLIC PANEL  URINALYSIS, ROUTINE W REFLEX MICROSCOPIC    EKG  EKG Interpretation None       Radiology No results found.  Procedures Procedures (including critical care time)  Medications Ordered in ED Medications  ondansetron (ZOFRAN) injection  4 mg (not administered)     Initial Impression / Assessment and Plan / ED Course  I have reviewed the triage vital signs and the nursing notes.  Pertinent labs & imaging results that were available during my care of the patient were reviewed by me and considered in my medical decision making (see chart for details).  Clinical Course     WEST BOOMERSHINE is a 70 y.o. male here with RLQ pain. Consider appy vs renal colic vs gastro. Will get labs, UA, CT ab/pel.   3:40 pm UA + large blood. I ordered CT ab/pel with IV contrast initially but changed it to Ct renal stone due to hematuria to  better assess kidney stone.   4:36 PM CT renal stone still pending. Pain controlled. If CT showed small stone and no appy, anticipate dc home with urology follow up. Signed out to Dr. Oleta Mouse   Final Clinical Impressions(s) / ED Diagnoses   Final diagnoses:  None    New Prescriptions New Prescriptions   No medications on file     Drenda Freeze, MD 07/16/16 [phone]

## 2016-07-16 NOTE — ED Triage Notes (Signed)
Pt sts RLQ pain starting this am; pt sent for eval

## 2016-07-17 ENCOUNTER — Other Ambulatory Visit: Payer: Self-pay | Admitting: Internal Medicine

## 2016-07-17 DIAGNOSIS — N281 Cyst of kidney, acquired: Secondary | ICD-10-CM

## 2016-07-19 ENCOUNTER — Other Ambulatory Visit: Payer: Self-pay | Admitting: Internal Medicine

## 2016-08-07 ENCOUNTER — Ambulatory Visit (INDEPENDENT_AMBULATORY_CARE_PROVIDER_SITE_OTHER): Payer: Managed Care, Other (non HMO) | Admitting: Internal Medicine

## 2016-08-07 ENCOUNTER — Encounter: Payer: Self-pay | Admitting: Internal Medicine

## 2016-08-07 VITALS — BP 152/86 | HR 76 | Temp 97.5°F | Resp 16 | Ht 68.5 in | Wt 246.0 lb

## 2016-08-07 DIAGNOSIS — E782 Mixed hyperlipidemia: Secondary | ICD-10-CM | POA: Diagnosis not present

## 2016-08-07 DIAGNOSIS — Z125 Encounter for screening for malignant neoplasm of prostate: Secondary | ICD-10-CM

## 2016-08-07 DIAGNOSIS — E559 Vitamin D deficiency, unspecified: Secondary | ICD-10-CM | POA: Diagnosis not present

## 2016-08-07 DIAGNOSIS — Z87898 Personal history of other specified conditions: Secondary | ICD-10-CM

## 2016-08-07 DIAGNOSIS — Z136 Encounter for screening for cardiovascular disorders: Secondary | ICD-10-CM | POA: Diagnosis not present

## 2016-08-07 DIAGNOSIS — Z0001 Encounter for general adult medical examination with abnormal findings: Secondary | ICD-10-CM | POA: Diagnosis not present

## 2016-08-07 DIAGNOSIS — Z79899 Other long term (current) drug therapy: Secondary | ICD-10-CM | POA: Diagnosis not present

## 2016-08-07 DIAGNOSIS — N183 Chronic kidney disease, stage 3 unspecified: Secondary | ICD-10-CM

## 2016-08-07 DIAGNOSIS — E1122 Type 2 diabetes mellitus with diabetic chronic kidney disease: Secondary | ICD-10-CM

## 2016-08-07 DIAGNOSIS — K219 Gastro-esophageal reflux disease without esophagitis: Secondary | ICD-10-CM

## 2016-08-07 DIAGNOSIS — R6889 Other general symptoms and signs: Secondary | ICD-10-CM

## 2016-08-07 DIAGNOSIS — E291 Testicular hypofunction: Secondary | ICD-10-CM

## 2016-08-07 DIAGNOSIS — M1000 Idiopathic gout, unspecified site: Secondary | ICD-10-CM

## 2016-08-07 DIAGNOSIS — Z1212 Encounter for screening for malignant neoplasm of rectum: Secondary | ICD-10-CM

## 2016-08-07 DIAGNOSIS — I10 Essential (primary) hypertension: Secondary | ICD-10-CM | POA: Diagnosis not present

## 2016-08-07 DIAGNOSIS — G4733 Obstructive sleep apnea (adult) (pediatric): Secondary | ICD-10-CM

## 2016-08-07 DIAGNOSIS — R5383 Other fatigue: Secondary | ICD-10-CM

## 2016-08-07 LAB — CBC WITH DIFFERENTIAL/PLATELET
Basophils Absolute: 74 cells/uL (ref 0–200)
Basophils Relative: 1 %
Eosinophils Absolute: 74 cells/uL (ref 15–500)
Eosinophils Relative: 1 %
HCT: 55.2 % — ABNORMAL HIGH (ref 38.5–50.0)
Hemoglobin: 18.8 g/dL — ABNORMAL HIGH (ref 13.2–17.1)
Lymphocytes Relative: 20 %
Lymphs Abs: 1480 cells/uL (ref 850–3900)
MCH: 29.1 pg (ref 27.0–33.0)
MCHC: 34.1 g/dL (ref 32.0–36.0)
MCV: 85.4 fL (ref 80.0–100.0)
MPV: 9.3 fL (ref 7.5–12.5)
Monocytes Absolute: 518 cells/uL (ref 200–950)
Monocytes Relative: 7 %
Neutro Abs: 5254 cells/uL (ref 1500–7800)
Neutrophils Relative %: 71 %
Platelets: 143 10*3/uL (ref 140–400)
RBC: 6.46 MIL/uL — ABNORMAL HIGH (ref 4.20–5.80)
RDW: 14.6 % (ref 11.0–15.0)
WBC: 7.4 10*3/uL (ref 3.8–10.8)

## 2016-08-07 LAB — HEPATIC FUNCTION PANEL
ALT: 23 U/L (ref 9–46)
AST: 19 U/L (ref 10–35)
Albumin: 4 g/dL (ref 3.6–5.1)
Alkaline Phosphatase: 52 U/L (ref 40–115)
Bilirubin, Direct: 0.2 mg/dL (ref <=0.2)
Indirect Bilirubin: 0.9 mg/dL (ref 0.2–1.2)
Total Bilirubin: 1.1 mg/dL (ref 0.2–1.2)
Total Protein: 6.2 g/dL (ref 6.1–8.1)

## 2016-08-07 LAB — IRON AND TIBC
%SAT: 47 % (ref 15–60)
Iron: 172 ug/dL (ref 50–180)
TIBC: 365 ug/dL (ref 250–425)
UIBC: 193 ug/dL (ref 125–400)

## 2016-08-07 LAB — BASIC METABOLIC PANEL WITH GFR
BUN: 16 mg/dL (ref 7–25)
CO2: 25 mmol/L (ref 20–31)
Calcium: 9.1 mg/dL (ref 8.6–10.3)
Chloride: 96 mmol/L — ABNORMAL LOW (ref 98–110)
Creat: 1.18 mg/dL (ref 0.70–1.25)
GFR, Est African American: 72 mL/min (ref >=60)
GFR, Est Non African American: 63 mL/min (ref >=60)
Glucose, Bld: 380 mg/dL — ABNORMAL HIGH (ref 65–99)
Potassium: 3.9 mmol/L (ref 3.5–5.3)
Sodium: 134 mmol/L — ABNORMAL LOW (ref 135–146)

## 2016-08-07 LAB — PSA: PSA: 0.8 ng/mL (ref <=4.0)

## 2016-08-07 LAB — HEMOGLOBIN A1C
Hgb A1c MFr Bld: 8.4 % — ABNORMAL HIGH (ref <5.7)
Mean Plasma Glucose: 194 mg/dL

## 2016-08-07 LAB — LIPID PANEL
Cholesterol: 157 mg/dL (ref <200)
HDL: 30 mg/dL — ABNORMAL LOW (ref >40)
LDL Cholesterol: 78 mg/dL (ref <100)
Total CHOL/HDL Ratio: 5.2 Ratio — ABNORMAL HIGH (ref <5.0)
Triglycerides: 245 mg/dL — ABNORMAL HIGH (ref <150)
VLDL: 49 mg/dL — ABNORMAL HIGH (ref <30)

## 2016-08-07 LAB — TSH: TSH: 1.52 mIU/L (ref 0.40–4.50)

## 2016-08-07 LAB — VITAMIN B12: Vitamin B-12: 303 pg/mL (ref 200–1100)

## 2016-08-07 NOTE — Progress Notes (Signed)
[facility]   Unk Pinto, M.D.    Uvaldo Bristle. Silverio Lay, P.A.-C      Starlyn Skeans, P.A.-C  [facility]                [address] [place] [address] Telephone [phone] Telefax [phone] Annual  Screening/Preventative Visit  & Comprehensive Evaluation & Examination     This very nice 71 y.o.  MWM presents for a Screening/Preventative Visit & comprehensive evaluation and management of multiple medical co-morbidities.  Patient has been followed for HTN, T2_NIDDM, Testosterone Deficiency, Hyperlipidemia and Vitamin D Deficiency. Patient also has GERD controlled w/Ranitidine. Patient has OSA and is on CPA with improved sleep hygiene altho his wife state he falls asleep "all the time". He also has hx/o gout w/o recent sx's or flare up's.  Recently he was seen for a kidney stone and was advised the re was an abnormality of the R kidney and he has a dedicated CT scan ordered to evaluate the lesion.      HTN predates since 20. Patient's BP has been controlled at home.  Today's BP is elevated at 152/86.  Patient denies any cardiac symptoms as chest pain, palpitations, shortness of breath, dizziness or ankle swelling.     Patient's hyperlipidemia is controlled with diet and medications. Patient denies myalgias or other medication SE's. Last lipids were at goal: Lab Results  Component Value Date   CHOL 150 04/24/2016   HDL 26 (L) 04/24/2016   LDLCALC 85 04/24/2016   TRIG 197 (H) 04/24/2016   CHOLHDL 5.8 (H) 04/24/2016      Patient has Morbid Obesity (BMI 36+) and consequent T2_NIDDM (1997) w/Stage CKD and patient denies reactive hypoglycemic symptoms, visual blurring, diabetic polys or paresthesias. Patient wife is present today and recounts his noncompliant  gluttonous overeating and his last A1c was not at goal: Lab Results  Component Value Date   HGBA1C 9.0 (H) 04/24/2016       Patient has  hx/o Testosterone deficiency and is on parenteral injection replacement therapy. Finally, patient has history of Vitamin D Deficiency in 2008 of "37" and he does not supplement Vit D as recommended and last vitamin D was still very low: Lab Results  Component Value Date   VD25OH 29 12/27/2015   Current Outpatient Prescriptions on File Prior to Visit  Medication Sig  . atenolol100 MG tablet take 1 tablet by mouth once daily  . glyBURIDE  5 MG tablet take 1/2 to 1 tab three times a day after meals  . hctz 25 MG tablet TAKE 1 TAB EVERY MORNING   . JANUMET 50-500 MG take 1 tab twice a day with food  . losartan 100 MG tablet take 1 tab once daily  . Magnesium 250 MG TABS Take2times daily.  . meloxicam  15 MG tablet take 1 tab once daily with food  . ranitidine  300 MG capsule TAKE 1 CAP ONCE DAILY  . testosterone cypio 200 MG injec INJECT 2 ml IM EVERY 14 DAYS   Allergies  Allergen Reactions  . Ace Inhibitors   . Ibuprofen     GI upset  . Invokana [Canagliflozin]   . Metformin And Related     Diarrhea  . Quinidine    Past Medical History:  Diagnosis Date  . BPH (benign prostatic hypertrophy)   . Diabetes mellitus without complication ([place])   .  GERD (gastroesophageal reflux disease)   . Hypertension   . Hypogonadism male   . OSA (obstructive sleep apnea)   . Vitamin D deficiency    Health Maintenance  Topic Date Due  . COLONOSCOPY  07/25/2015  . FOOT EXAM  05/23/2016  . HEMOGLOBIN A1C  10/23/2016  . OPHTHALMOLOGY EXAM  04/17/2017  . TETANUS/TDAP  08/12/2022  . INFLUENZA VACCINE  Completed  . ZOSTAVAX  Completed  . Hepatitis C Screening  Completed  . PNA vac Low Risk Adult  Completed   Immunization History  Administered Date(s) Administered  . Influenza Split 06/23/2013  . Influenza, High Dose Seasonal PF 06/29/2014, 05/24/2015, 04/24/2016  . Pneumococcal Conjugate-13 01/05/2014  . Pneumococcal-Unspecified 07/24/1994  . Td 06/23/2002  . Zoster 10/23/2010   Past  Surgical History:  Procedure Laterality Date  . HERNIA REPAIR  0962   umbilical  . ROTATOR CUFF REPAIR Left 1996  . ROTATOR CUFF REPAIR Right 1997  . TONSILLECTOMY  1952   Family History  Problem Relation Age of Onset  . Cancer Mother     throat  . Diabetes Mother   . Thyroid disease Father   . Cancer Father     lung  . Thyroid disease Sister   . Hypertension Son   . Hyperlipidemia Son   . Cancer Paternal Grandmother     colon   Social History   Social History  . Marital status: Married    Spouse name: N/A  . Number of children: N/A  . Years of education: N/A   Occupational History  . Truck Geophysicist/field seismologist   Social History Main Topics  . Smoking status: Never Smoker  . Smokeless tobacco: Not on file  . Alcohol use Yes, very rarely  . Drug use: Unknown  . Sexual activity: Not on file    ROS Constitutional: Denies fever, chills, weight loss/gain, headaches, insomnia,  night sweats or change in appetite. Does c/o fatigue. Eyes: Denies redness, blurred vision, diplopia, discharge, itchy or watery eyes.  ENT: Denies discharge, congestion, post nasal drip, epistaxis, sore throat, earache, hearing loss, dental pain, Tinnitus, Vertigo, Sinus pain or snoring.  Cardio: Denies chest pain, palpitations, irregular heartbeat, syncope, dyspnea, diaphoresis, orthopnea, PND, claudication or edema Respiratory: denies cough, dyspnea, DOE, pleurisy, hoarseness, laryngitis or wheezing.  Gastrointestinal: Denies dysphagia, heartburn, reflux, water brash, pain, cramps, nausea, vomiting, bloating, diarrhea, constipation, hematemesis, melena, hematochezia, jaundice or hemorrhoids Genitourinary: Denies dysuria, frequency, urgency, nocturia, hesitancy, discharge, hematuria or flank pain Musculoskeletal: Denies arthralgia, myalgia, stiffness, Jt. Swelling, pain, limp or strain/sprain. Denies Falls. Skin: Denies puritis, rash, hives, warts, acne, eczema or change in skin lesion Neuro: No weakness,  tremor, incoordination, spasms, paresthesia or pain Psychiatric: Denies confusion, memory loss or sensory loss. Denies Depression. Endocrine: Denies change in weight, skin, hair change, nocturia, and paresthesia, diabetic polys, visual blurring or hyper / hypo glycemic episodes.  Heme/Lymph: No excessive bleeding, bruising or enlarged lymph nodes.  Physical Exam  BP  152/86   P76   T97.5 F   R 16   Ht 5' 8.5"    Wt 246 lb   BMI 36.86   General Appearance:  Over nourished, in no apparent distress.  Eyes: PERRLA, EOMs, conjunctiva no swelling or erythema, normal fundi and vessels. Sinuses: No frontal/maxillary tenderness ENT/Mouth: EACs patent / TMs  nl. Nares clear without erythema, swelling, mucoid exudates. Oral hygiene is good. No erythema, swelling, or exudate. Tongue normal, non-obstructing. Tonsils not swollen or erythematous. Hearing normal.  Neck: Supple, thyroid normal.  No bruits, nodes or JVD. Respiratory: Respiratory effort normal.  BS equal and clear bilateral without rales, rhonci, wheezing or stridor. Cardio: Heart sounds are normal with regular rate and rhythm and no murmurs, rubs or gallops. Peripheral pulses are normal and equal bilaterally without edema. No aortic or femoral bruits. Chest: symmetric with normal excursions and percussion.  Abdomen: Soft with redundant abdominal panniculus with Nl bowel sounds. Nontender, no guarding, rebound, hernias, masses, or organomegaly.  Lymphatics: Non tender without lymphadenopathy.  Genitourinary: No hernias.Testes nl. DRE - prostate nl for age - smooth & firm w/o nodules. Musculoskeletal: Full ROM all peripheral extremities, joint stability, 5/5 strength, and normal gait. Skin: Warm and dry without rashes, lesions, cyanosis, clubbing or  ecchymosis.  Neuro: Cranial nerves intact, reflexes equal bilaterally. Normal muscle tone, no cerebellar symptoms. Sensation intact to touch, Vibratory and Monofilament to the toes bilaterally.   Pysch: Alert and oriented X 3 with normal affect, insight and judgment appropriate.   Assessment and Plan  1. Annual Preventative/Screening Exam    2. Essential hypertension  - Microalbumin / creatinine urine ratio - EKG 12-Lead - [place], RETROPERITNL ABD,  LTD - Urinalysis, Routine w reflex microscopic - CBC with Differential/Platelet - BASIC METABOLIC PANEL WITH GFR - TSH  3. Hyperlipidemia  - EKG 12-Lead - [place], RETROPERITNL ABD,  LTD - Hepatic function panel - Lipid panel - TSH  4. 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 Vitamin D deficiency  - VITAMIN D 25 Hydroxy   6. OSA (obstructive sleep apnea)   7. Screening for rectal cancer  - POC Hemoccult Bld/Stl  - PSA  8. Prostate cancer screening  - PSA  9. History of elevated PSA   10. Acute idiopathic gout  - Uric acid  11. Gastroesophageal reflux disease   12. Testosterone Deficiency  - Testosterone  13. Screening for ischemic heart disease  - EKG 12-Lead  14. Screening for AAA (aortic abdominal aneurysm)  - [place], RETROPERITNL ABD,  LTD  15. Medication management  - Urinalysis, Routine w reflex microscopic - CBC with Differential/Platelet - BASIC METABOLIC PANEL WITH GFR - Hepatic function panel - Magnesium  16. Fatigue  - CBC with Differential/Platelet - TSH - Vitamin B12 - Iron and TIBC       Continue prudent diet as discussed, weight control, BP monitoring, regular exercise, and medications as discussed.  Discussed med effects and SE's. Routine screening labs and tests as requested with regular follow-up as recommended. Over 40 minutes of exam, counseling, chart review and high complex critical decision making was performed

## 2016-08-07 NOTE — Patient Instructions (Signed)

## 2016-08-08 ENCOUNTER — Other Ambulatory Visit: Payer: Self-pay | Admitting: Internal Medicine

## 2016-08-08 LAB — URINALYSIS, ROUTINE W REFLEX MICROSCOPIC
Bilirubin Urine: NEGATIVE
Hgb urine dipstick: NEGATIVE
Leukocytes, UA: NEGATIVE
Nitrite: NEGATIVE
Protein, ur: NEGATIVE
Specific Gravity, Urine: 1.027 (ref 1.001–1.035)
pH: 5.5 (ref 5.0–8.0)

## 2016-08-08 LAB — VITAMIN D 25 HYDROXY (VIT D DEFICIENCY, FRACTURES): Vit D, 25-Hydroxy: 26 ng/mL — ABNORMAL LOW (ref 30–100)

## 2016-08-08 LAB — MICROALBUMIN / CREATININE URINE RATIO
Creatinine, Urine: 68 mg/dL (ref 20–370)
Microalb Creat Ratio: 29 mcg/mg creat (ref <30)
Microalb, Ur: 2 mg/dL

## 2016-08-08 LAB — URINALYSIS, MICROSCOPIC ONLY
Bacteria, UA: NONE SEEN [HPF]
Casts: NONE SEEN [LPF]
Crystals: NONE SEEN [HPF]
RBC / HPF: NONE SEEN RBC/HPF (ref <=2)
Squamous Epithelial / LPF: NONE SEEN [HPF] (ref <=5)
WBC, UA: NONE SEEN WBC/HPF (ref <=5)
Yeast: NONE SEEN [HPF]

## 2016-08-08 LAB — TESTOSTERONE: Testosterone: 338 ng/dL (ref 250–827)

## 2016-08-08 LAB — INSULIN, RANDOM: Insulin: 61.1 u[IU]/mL — ABNORMAL HIGH (ref 2.0–19.6)

## 2016-08-08 LAB — URIC ACID: Uric Acid, Serum: 5.6 mg/dL (ref 4.0–8.0)

## 2016-08-08 LAB — MAGNESIUM: Magnesium: 1.5 mg/dL (ref 1.5–2.5)

## 2016-08-19 ENCOUNTER — Other Ambulatory Visit: Payer: Self-pay | Admitting: Internal Medicine

## 2016-09-24 ENCOUNTER — Other Ambulatory Visit: Payer: Self-pay | Admitting: Internal Medicine

## 2016-10-15 ENCOUNTER — Other Ambulatory Visit: Payer: Self-pay | Admitting: Internal Medicine

## 2016-10-17 ENCOUNTER — Other Ambulatory Visit: Payer: Self-pay | Admitting: Internal Medicine

## 2016-11-26 DIAGNOSIS — R632 Polyphagia: Secondary | ICD-10-CM | POA: Insufficient documentation

## 2016-11-26 NOTE — Progress Notes (Signed)
This very nice 71 y.o. 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 He has GERD controlled on current meds. Patient has OSA/ on CPAP with improved restorative sleep. Other problems include hx/o Gout likewise apparently controlled with meds.      Patient is treated for HTN (1985) & BP has been controlled at home. Today's BP is at goal - 132/82. Patient has had no complaints of any cardiac type chest pain, palpitations, dyspnea/orthopnea/PND, dizziness, claudication, or dependent edema.     Hyperlipidemia is controlled with diet & meds. Patient denies myalgias or other med SE's. Last Lipids were at goal albeit elevated Trig's: Lab Results  Component Value Date   CHOL 157 08/07/2016   HDL 30 (L) 08/07/2016   LDLCALC 78 08/07/2016   TRIG 245 (H) 08/07/2016   CHOLHDL 5.2 (H) 08/07/2016      Also, the patient has history of Morbid Obesity (BMI 37+) and T2_NIDDM since 1997 and with CKD3. He denies  symptoms of reactive hypoglycemia, diabetic polys, paresthesias or visual blurring.  Patient admits he does not monitor CBG's as advised. Last A1c was not at goal: Lab Results  Component Value Date   HGBA1C 8.4 (H) 08/07/2016      Patient has long hx/o Low T and has been on parenteral testosterone replacement with improved stamina & sense of well being.  Further, the patient also has history of Vitamin D Deficiency ("37" in 2008)  and he does not supplement as repeatedly recommended. Last vitamin D was still very low: Lab Results  Component Value Date   VD25OH 26 (L) 08/07/2016   Current Outpatient Prescriptions on File Prior to Visit  Medication Sig  . atenolol  100 MG tablet take 1 tab once daily  . glyBURIDE 5 MG tablet take 1/2-1 tab  three times a day after meals  . hctz 25 MG tablet TAKE 1 TAB EVERY MORNING   . JANUMET 50-500 MG tablet take 1 tab twice a day with food  . losartan  100 MG tablet take 1 tab once daily  .  Magnesium 250 MG TABS Take  2 (two) times daily.  . meloxicam  15 MG tablet take 1 tab once daily with food  . phentermine  37.5 MG tablet take 1/2-1 tab every morning   . ranitidine  300 MG capsule TAKE 1 CAP  DAILY  . tamsulosin  0.4 MG CAPS capsule Take 1 cap daily   . testosterone cypio 200 MG inj INJ 2 ml IM EVERY 14 DAYS   Allergies  Allergen Reactions  . Ace Inhibitors   . Ibuprofen     GI upset  . Invokana [Canagliflozin]   . Metformin And Related     Diarrhea  . Quinidine    PMHx:   Past Medical History:  Diagnosis Date  . BPH (benign prostatic hypertrophy)   . Diabetes mellitus without complication ([place])   . GERD (gastroesophageal reflux disease)   . Hypertension   . Hypogonadism male   . OSA (obstructive sleep apnea)   . Vitamin D deficiency    Immunization History  Administered Date(s) Administered  . Influenza Split 06/23/2013  . Influenza, High Dose Seasonal PF 06/29/2014, 05/24/2015, 04/24/2016  . Pneumococcal Conjugate-13 01/05/2014  . Pneumococcal-Unspecified 07/24/1994  . Td 06/23/2002  . Zoster 10/23/2010   Past Surgical History:  Procedure Laterality Date  . HERNIA REPAIR  9357   umbilical  . ROTATOR CUFF REPAIR  Left 1996  . ROTATOR CUFF REPAIR Right 1997  . TONSILLECTOMY  1952   FHx:    Reviewed / unchanged  SHx:    Reviewed / unchanged  Systems Review:  Constitutional: Denies fever, chills, wt changes, headaches, insomnia, fatigue, night sweats, change in appetite. Eyes: Denies redness, blurred vision, diplopia, discharge, itchy, watery eyes.  ENT: Denies discharge, congestion, post nasal drip, epistaxis, sore throat, earache, hearing loss, dental pain, tinnitus, vertigo, sinus pain, snoring.  CV: Denies chest pain, palpitations, irregular heartbeat, syncope, dyspnea, diaphoresis, orthopnea, PND, claudication or edema. Respiratory: denies cough, dyspnea, DOE, pleurisy, hoarseness, laryngitis, wheezing.  Gastrointestinal: Denies dysphagia,  odynophagia, heartburn, reflux, water brash, abdominal pain or cramps, nausea, vomiting, bloating, diarrhea, constipation, hematemesis, melena, hematochezia  or hemorrhoids. Genitourinary: Denies dysuria, frequency, urgency, nocturia, hesitancy, discharge, hematuria or flank pain. Musculoskeletal: Denies arthralgias, myalgias, stiffness, jt. swelling, pain, limping or strain/sprain.  Skin: Denies pruritus, rash, hives, warts, acne, eczema or change in skin lesion(s). Neuro: No weakness, tremor, incoordination, spasms, paresthesia or pain. Psychiatric: Denies confusion, memory loss or sensory loss. Endo: Denies change in weight, skin or hair change.  Heme/Lymph: No excessive bleeding, bruising or enlarged lymph nodes.  Physical Exam  BP 132/82   Pulse 72   Temp 97.3 F (36.3 C)   Resp 16   Ht 5' 8.5" (1.74 m)   Wt 252 lb 3.2 oz (114.4 kg)   BMI 37.79 kg/m   Appears well nourished, well groomed  and in no distress.  Eyes: PERRLA, EOMs, conjunctiva no swelling or erythema. Sinuses: No frontal/maxillary tenderness ENT/Mouth: EAC's clear, TM's nl w/o erythema, bulging. Nares clear w/o erythema, swelling, exudates. Oropharynx clear without erythema or exudates. Oral hygiene is good. Tongue normal, non obstructing. Hearing intact.  Neck: Supple. Thyroid nl. Car 2+/2+ without bruits, nodes or JVD. Chest: Respirations nl with BS clear & equal w/o rales, rhonchi, wheezing or stridor.  Cor: Heart sounds normal w/ regular rate and rhythm without sig. murmurs, gallops, clicks or rubs. Peripheral pulses normal and equal  without edema.  Abdomen: Soft & bowel sounds normal. Non-tender w/o guarding, rebound, hernias, masses or organomegaly.  Lymphatics: Unremarkable.  Musculoskeletal: Full ROM all peripheral extremities, joint stability, 5/5 strength and normal gait.  Skin: Warm, dry without exposed rashes, lesions or ecchymosis apparent.  Neuro: Cranial nerves intact, reflexes equal bilaterally.  Sensory-motor testing grossly intact. Tendon reflexes grossly intact.  Pysch: Alert & oriented x 3.  Insight and judgement nl & appropriate. No ideations.  Assessment and Plan:    1. Essential hypertension  - Continue medication, monitor blood pressure at home.  - Continue DASH diet. Reminder to go to the ER if any CP,  SOB, nausea, dizziness, severe HA, changes vision/speech,  left arm numbness and tingling and jaw pain.  - BASIC METABOLIC PANEL WITH GFR - Magnesium - TSH - CBC with Differential/Platelet  2. Hyperlipidemia, mixed  - Continue diet/meds, exercise,& lifestyle modifications.  - Continue monitor periodic cholesterol/liver & renal functions   - Hepatic function panel - Lipid panel - TSH  3. 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  - Monitor appropriate labs. - Hemoglobin A1c - Insulin, random  4. Vitamin D deficiency  - Continue supplementation.  - VITAMIN D 25 Hydroxy  5. Acute idiopathic gout  - Uric acid  6. Gastroesophageal reflux disease   7. Testosterone Deficiency  - Testosterone  8. Morbidly obese ([place])   9. Gluttony   10. Medication management  - BASIC METABOLIC  PANEL WITH GFR - Hepatic function panel - Magnesium - Lipid panel - TSH - Hemoglobin A1c - Insulin, random - VITAMIN D 25 Hydroxy  - CBC with Differential/Platelet - Testosterone - Uric acid      Discussed  regular exercise, BP monitoring, weight control to achieve/maintain BMI less than 25 and discussed med and SE's. Recommended labs to assess and monitor clinical status with further disposition pending results of labs. Over 30 minutes of exam, counseling, chart review was performed.

## 2016-11-27 ENCOUNTER — Encounter: Payer: Self-pay | Admitting: Internal Medicine

## 2016-11-27 ENCOUNTER — Ambulatory Visit (INDEPENDENT_AMBULATORY_CARE_PROVIDER_SITE_OTHER): Payer: Managed Care, Other (non HMO) | Admitting: Internal Medicine

## 2016-11-27 VITALS — BP 132/82 | HR 72 | Temp 97.3°F | Resp 16 | Ht 68.5 in | Wt 252.2 lb

## 2016-11-27 DIAGNOSIS — E1122 Type 2 diabetes mellitus with diabetic chronic kidney disease: Secondary | ICD-10-CM

## 2016-11-27 DIAGNOSIS — E291 Testicular hypofunction: Secondary | ICD-10-CM

## 2016-11-27 DIAGNOSIS — I10 Essential (primary) hypertension: Secondary | ICD-10-CM | POA: Diagnosis not present

## 2016-11-27 DIAGNOSIS — E782 Mixed hyperlipidemia: Secondary | ICD-10-CM | POA: Diagnosis not present

## 2016-11-27 DIAGNOSIS — M1000 Idiopathic gout, unspecified site: Secondary | ICD-10-CM

## 2016-11-27 DIAGNOSIS — K219 Gastro-esophageal reflux disease without esophagitis: Secondary | ICD-10-CM | POA: Diagnosis not present

## 2016-11-27 DIAGNOSIS — Z79899 Other long term (current) drug therapy: Secondary | ICD-10-CM | POA: Diagnosis not present

## 2016-11-27 DIAGNOSIS — N183 Chronic kidney disease, stage 3 unspecified: Secondary | ICD-10-CM

## 2016-11-27 DIAGNOSIS — R632 Polyphagia: Secondary | ICD-10-CM | POA: Diagnosis not present

## 2016-11-27 DIAGNOSIS — E559 Vitamin D deficiency, unspecified: Secondary | ICD-10-CM

## 2016-11-27 LAB — CBC WITH DIFFERENTIAL/PLATELET
Basophils Absolute: 98 cells/uL (ref 0–200)
Basophils Relative: 1 %
Eosinophils Absolute: 294 cells/uL (ref 15–500)
Eosinophils Relative: 3 %
HCT: 52.7 % — ABNORMAL HIGH (ref 38.5–50.0)
Hemoglobin: 18.1 g/dL — ABNORMAL HIGH (ref 13.2–17.1)
Lymphocytes Relative: 23 %
Lymphs Abs: 2254 cells/uL (ref 850–3900)
MCH: 29.3 pg (ref 27.0–33.0)
MCHC: 34.3 g/dL (ref 32.0–36.0)
MCV: 85.4 fL (ref 80.0–100.0)
MPV: 9.3 fL (ref 7.5–12.5)
Monocytes Absolute: 686 cells/uL (ref 200–950)
Monocytes Relative: 7 %
Neutro Abs: 6468 cells/uL (ref 1500–7800)
Neutrophils Relative %: 66 %
Platelets: 153 10*3/uL (ref 140–400)
RBC: 6.17 MIL/uL — ABNORMAL HIGH (ref 4.20–5.80)
RDW: 14.3 % (ref 11.0–15.0)
WBC: 9.8 10*3/uL (ref 3.8–10.8)

## 2016-11-27 LAB — LIPID PANEL
Cholesterol: 148 mg/dL (ref <200)
HDL: 27 mg/dL — ABNORMAL LOW (ref >40)
LDL Cholesterol: 77 mg/dL (ref <100)
Total CHOL/HDL Ratio: 5.5 Ratio — ABNORMAL HIGH (ref <5.0)
Triglycerides: 222 mg/dL — ABNORMAL HIGH (ref <150)
VLDL: 44 mg/dL — ABNORMAL HIGH (ref <30)

## 2016-11-27 LAB — HEPATIC FUNCTION PANEL
ALT: 17 U/L (ref 9–46)
AST: 16 U/L (ref 10–35)
Albumin: 3.8 g/dL (ref 3.6–5.1)
Alkaline Phosphatase: 45 U/L (ref 40–115)
Bilirubin, Direct: 0.1 mg/dL (ref <=0.2)
Indirect Bilirubin: 0.6 mg/dL (ref 0.2–1.2)
Total Bilirubin: 0.7 mg/dL (ref 0.2–1.2)
Total Protein: 6.1 g/dL (ref 6.1–8.1)

## 2016-11-27 LAB — BASIC METABOLIC PANEL WITH GFR
BUN: 16 mg/dL (ref 7–25)
CO2: 30 mmol/L (ref 20–31)
Calcium: 9.5 mg/dL (ref 8.6–10.3)
Chloride: 96 mmol/L — ABNORMAL LOW (ref 98–110)
Creat: 1.17 mg/dL (ref 0.70–1.25)
GFR, Est African American: 73 mL/min (ref >=60)
GFR, Est Non African American: 63 mL/min (ref >=60)
Glucose, Bld: 196 mg/dL — ABNORMAL HIGH (ref 65–99)
Potassium: 4.3 mmol/L (ref 3.5–5.3)
Sodium: 137 mmol/L (ref 135–146)

## 2016-11-27 LAB — TSH: TSH: 3.17 mIU/L (ref 0.40–4.50)

## 2016-11-27 NOTE — Patient Instructions (Signed)

## 2016-11-28 LAB — VITAMIN D 25 HYDROXY (VIT D DEFICIENCY, FRACTURES): Vit D, 25-Hydroxy: 33 ng/mL (ref 30–100)

## 2016-11-28 LAB — MAGNESIUM: Magnesium: 1.6 mg/dL (ref 1.5–2.5)

## 2016-11-28 LAB — HEMOGLOBIN A1C
Hgb A1c MFr Bld: 8.4 % — ABNORMAL HIGH (ref <5.7)
Mean Plasma Glucose: 194 mg/dL

## 2016-11-28 LAB — TESTOSTERONE: Testosterone: 1371 ng/dL — ABNORMAL HIGH (ref 250–827)

## 2016-11-28 LAB — URIC ACID: Uric Acid, Serum: 5.5 mg/dL (ref 4.0–8.0)

## 2016-11-28 LAB — INSULIN, RANDOM: Insulin: 24.8 u[IU]/mL — ABNORMAL HIGH (ref 2.0–19.6)

## 2016-12-05 ENCOUNTER — Other Ambulatory Visit: Payer: Self-pay | Admitting: Internal Medicine

## 2017-01-12 ENCOUNTER — Other Ambulatory Visit: Payer: Self-pay | Admitting: Physician Assistant

## 2017-01-12 DIAGNOSIS — E349 Endocrine disorder, unspecified: Secondary | ICD-10-CM

## 2017-01-12 NOTE — Telephone Encounter (Signed)
Please call Depo - Testosterone

## 2017-01-29 ENCOUNTER — Ambulatory Visit (INDEPENDENT_AMBULATORY_CARE_PROVIDER_SITE_OTHER): Payer: Managed Care, Other (non HMO) | Admitting: Internal Medicine

## 2017-01-29 ENCOUNTER — Encounter: Payer: Self-pay | Admitting: Internal Medicine

## 2017-01-29 VITALS — BP 140/88 | HR 76 | Temp 97.3°F | Resp 16 | Ht 68.5 in | Wt 242.0 lb

## 2017-01-29 DIAGNOSIS — N401 Enlarged prostate with lower urinary tract symptoms: Secondary | ICD-10-CM | POA: Diagnosis not present

## 2017-01-29 DIAGNOSIS — M25562 Pain in left knee: Secondary | ICD-10-CM

## 2017-01-29 MED ORDER — PREDNISONE 20 MG PO TABS: ORAL_TABLET | ORAL | 0 refills | Status: DC

## 2017-01-29 MED ORDER — TAMSULOSIN HCL 0.4 MG PO CAPS: ORAL_CAPSULE | ORAL | 1 refills | Status: AC

## 2017-01-29 NOTE — Progress Notes (Signed)
  Subjective:    Patient ID: Phillip Powell, male    DOB: 03/22/1946, 71 y.o.   MRN: [id number]  HPI   This nice overweight 71 yo MWM T2_DM presents with c/o LLE pains. He relates Dr Wynelle Link has advised Lt TKR which he is trying to defer to retirement in a couple of years. No recent injury. Has had knee injections in the past with benefit.   Meds & Allergies reviewed  Review of Systems  10 point systems review negative except as above.  And also he 's c/o LUTS off of Flomax & requests refill.    Objective:   Physical Exam  BP 140/88   Pulse 76   Temp (!) 97.3 F (36.3 C)   Resp 16   Ht 5' 8.5" (1.74 m)   Wt 242 lb (109.8 kg)   BMI 36.26 kg/m   HEENT - WNL. Neck - supple.  Chest - Clear equal BS. Cor - Nl HS. RRR w/o sig MGR. PP 1(+). No edema. MS- FROM w/o deformities. No pain /tendernes w/ Thigh or calf compression. (-) neg SLR.Lt knee w/o evident STS or effusion.  Neuro -  Nl w/o focal abnormalities.    Assessment & Plan:   1. Left knee pain, unspecified chronicity  - predniSONE (DELTASONE) 20 MG tablet; 1 tab 3 x day for 2 days, then 1 tab 2 x day for 2 days, then 1 tab 1 x day for 3 days  Dispense: 13 tablet; Refill: 0  - caution hyperglycemic of Prednisone and advised daily monitoring  2. Benign localized prostatic hyperplasia with lower urinary tract symptoms (LUTS)  - tamsulosin (FLOMAX) 0.4 MG CAPS capsule; Take 1 capsule daily for Prostate  Dispense: 90 capsule; Refill: 1

## 2017-01-30 ENCOUNTER — Other Ambulatory Visit: Payer: Self-pay | Admitting: *Deleted

## 2017-01-30 DIAGNOSIS — M25562 Pain in left knee: Secondary | ICD-10-CM

## 2017-01-30 MED ORDER — PREDNISONE 20 MG PO TABS: ORAL_TABLET | ORAL | 0 refills | Status: DC

## 2017-03-04 NOTE — Progress Notes (Signed)
This very nice 71 y.o. MWM presents for 6 month follow up with Hypertension, Hyperlipidemia, Pre-Diabetes and Vitamin D Deficiency. Patient has GERD  & Gout controlled with meds. He also has OSA on CPAP with improved sleep hygiene and less daytime hypersomnolence.     Patient is treated for HTN since 1985 & BP has been controlled at home. Today's BP is at goal - 114/82. Patient has had no complaints of any cardiac type chest pain, palpitations, dyspnea/orthopnea/PND, dizziness, claudication, or dependent edema.     Hyperlipidemia is controlled with diet & meds. Patient denies myalgias or other med SE's. Last Lipids were at goal albeit elevated Trig's: Lab Results  Component Value Date   CHOL 148 11/27/2016   HDL 27 (L) 11/27/2016   LDLCALC 77 11/27/2016   TRIG 222 (H) 11/27/2016   CHOLHDL 5.5 (H) 11/27/2016      Also, the patient is Morbidly Obese (BMI 37+) and admits Gluttonous overeating and has T2_NIDDM (1997) w/CKD3  and has had no symptoms of reactive hypoglycemia, diabetic polys, paresthesias or visual blurring. Patient is reticent to trying to improve Diabetic control with better eating behaviors.  Not unexpectedly, his last A1c was not at goal: Lab Results  Component Value Date   HGBA1C 8.4 (H) 11/27/2016      Patient has history of Testosterone Defiency and is on  Depo-Testosterone with improved energy, libido & sense of well being.  Further, the patient also has history of Vitamin D Deficiency of "37" in 2008 and does not supplement vitamin D as repeatedly recommended. Last vitamin D was still very low: Lab Results  Component Value Date   VD25OH 33 11/27/2016   Current Outpatient Prescriptions on File Prior to Visit  Medication Sig  . atenolol (TENORMIN) 100 MG tablet take 1 tablet by mouth once daily  . B-D 3CC LUER-LOK SYR 21GX1" 21G X 1" 3 ML MISC use as directed  . Blood Glucose Monitoring Suppl (ONETOUCH VERIO IQ SYSTEM) W/DEVICE KIT Patient test blood sugar once  daily  . glucose blood (ONETOUCH VERIO) test strip Patient to check blood sugar once daily  . glyBURIDE (DIABETA) 5 MG tablet take 1/2 to 1 tablet by mouth three times a day after meals  . hydrochlorothiazide (HYDRODIURIL) 25 MG tablet take 1 tablet by mouth every morning for blood pressure  . JANUMET 50-500 MG tablet take 1 tablet by mouth twice a day with food  . Lancets MISC Patient to check blood sugar once daily  . losartan (COZAAR) 100 MG tablet take 1 tablet by mouth once daily  . Magnesium 250 MG TABS Take by mouth 2 (two) times daily.  . meloxicam (MOBIC) 15 MG tablet take 1 tablet by mouth once daily with food  . phentermine (ADIPEX-P) 37.5 MG tablet take 1/2 to 1 tablet by mouth every morning FOR DIETING AND WEIGHT LOSS  . ranitidine (ZANTAC) 300 MG capsule TAKE 1 CAPSULE BY MOUTH ONCE DAILY  . tamsulosin (FLOMAX) 0.4 MG CAPS capsule Take 1 capsule daily for Prostate  . testosterone cypionate (DEPOTESTOSTERONE CYPIONATE) 200 MG/ML injection INJECT 2 MILLILITERS INTRAMUSCULARLY EVERY 2 WEEKS AS DIRECTED   No current facility-administered medications on file prior to visit.    Allergies  Allergen Reactions  . Ace Inhibitors   . Ibuprofen     GI upset  . Invokana [Canagliflozin]   . Metformin And Related     Diarrhea  . Quinidine    PMHx:   Past Medical History:  Diagnosis Date  . BPH (benign prostatic hypertrophy)   . Diabetes mellitus without complication ([place])   . GERD (gastroesophageal reflux disease)   . Hypertension   . Hypogonadism male   . OSA (obstructive sleep apnea)   . Vitamin D deficiency    Immunization History  Administered Date(s) Administered  . Influenza Split 06/23/2013  . Influenza, High Dose Seasonal PF 06/29/2014, 05/24/2015, 04/24/2016  . Pneumococcal Conjugate-13 01/05/2014  . Pneumococcal-Unspecified 07/24/1994  . Td 06/23/2002  . Zoster 10/23/2010   Past Surgical History:  Procedure Laterality Date  . HERNIA REPAIR  9702   umbilical    . ROTATOR CUFF REPAIR Left 1996  . ROTATOR CUFF REPAIR Right 1997  . TONSILLECTOMY  1952   FHx:    Reviewed / unchanged  SHx:    Reviewed / unchanged  Systems Review:  Constitutional: Denies fever, chills, wt changes, headaches, insomnia, fatigue, night sweats, change in appetite. Eyes: Denies redness, blurred vision, diplopia, discharge, itchy, watery eyes.  ENT: Denies discharge, congestion, post nasal drip, epistaxis, sore throat, earache, hearing loss, dental pain, tinnitus, vertigo, sinus pain, snoring.  CV: Denies chest pain, palpitations, irregular heartbeat, syncope, dyspnea, diaphoresis, orthopnea, PND, claudication or edema. Respiratory: denies cough, dyspnea, DOE, pleurisy, hoarseness, laryngitis, wheezing.  Gastrointestinal: Denies dysphagia, odynophagia, heartburn, reflux, water brash, abdominal pain or cramps, nausea, vomiting, bloating, diarrhea, constipation, hematemesis, melena, hematochezia  or hemorrhoids. Genitourinary: Denies dysuria, frequency, urgency, nocturia, hesitancy, discharge, hematuria or flank pain. Musculoskeletal: Denies arthralgias, myalgias, stiffness, jt. swelling, pain, limping or strain/sprain.  Skin: Denies pruritus, rash, hives, warts, acne, eczema or change in skin lesion(s). Neuro: No weakness, tremor, incoordination, spasms, paresthesia or pain. Psychiatric: Denies confusion, memory loss or sensory loss. Endo: Denies change in weight, skin or hair change.  Heme/Lymph: No excessive bleeding, bruising or enlarged lymph nodes.  Physical Exam  BP 114/82   Pulse 80   Temp (!) 96.6 F (35.9 C)   Resp 16   Ht 5' 8.5" (1.74 m)   Wt 242 lb 9.6 oz (110 kg)   BMI 36.35 kg/m   Appears well nourished, well groomed  and in no distress.  Eyes: PERRLA, EOMs, conjunctiva no swelling or erythema. Sinuses: No frontal/maxillary tenderness ENT/Mouth: EAC's clear, TM's nl w/o erythema, bulging. Nares clear w/o erythema, swelling, exudates. Oropharynx  clear without erythema or exudates. Oral hygiene is good. Tongue normal, non obstructing. Hearing intact.  Neck: Supple. Thyroid nl. Car 2+/2+ without bruits, nodes or JVD. Chest: Respirations nl with BS clear & equal w/o rales, rhonchi, wheezing or stridor.  Cor: Heart sounds normal w/ regular rate and rhythm without sig. murmurs, gallops, clicks or rubs. Peripheral pulses normal and equal  without edema.  Abdomen: Soft & bowel sounds normal. Non-tender w/o guarding, rebound, hernias, masses or organomegaly.  Lymphatics: Unremarkable.  Musculoskeletal: Full ROM all peripheral extremities, joint stability, 5/5 strength and normal gait.  Skin: Warm, dry without exposed rashes, lesions or ecchymosis apparent.  Neuro: Cranial nerves intact, reflexes equal bilaterally. Sensory-motor testing grossly intact. Tendon reflexes grossly intact.  Pysch: Alert & oriented x 3.  Insight and judgement nl & appropriate. No ideations.  Assessment and Plan:  1. Essential hypertension  - Continue medication, monitor blood pressure at home.  - Continue DASH diet. Reminder to go to the ER if any CP,  SOB, nausea, dizziness, severe HA, changes vision/speech.  - CBC with Differential/Platelet - BASIC METABOLIC PANEL WITH GFR - Magnesium - TSH  2. Hyperlipidemia, mixed  - Continue diet/meds,  exercise,& lifestyle modifications.  - Continue monitor periodic cholesterol/liver & renal functions   - Hepatic function panel - Lipid panel - TSH  3. 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  - Monitor appropriate labs.  - Hemoglobin A1c - Insulin, random  4. Vitamin D deficiency  - Continue supplementation.  - VITAMIN D 25 Hydroxy   5. Gastroesophageal reflux disease  6. Testosterone Deficiency  - Testosterone  7. Acute idiopathic gout, unspecified site  - Uric acid  8. Poor compliance with Diet   9. Medication management  - CBC with Differential/Platelet -  BASIC METABOLIC PANEL WITH GFR - Hepatic function panel - Magnesium - Lipid panel - TSH - Hemoglobin A1c - Insulin, random - VITAMIN D 25 Hydroxy - Uric acid - Testosterone      Discussed  regular exercise, BP monitoring, weight control to achieve/maintain BMI less than 25 and discussed med and SE's. Recommended labs to assess and monitor clinical status with further disposition pending results of labs. Over 30 minutes of exam, counseling, chart review was performed.

## 2017-03-04 NOTE — Patient Instructions (Signed)
+++++++++++++++++++++++++ Diabetes Mellitus and Exercise Exercising regularly is important for your overall health, especially when you have diabetes (diabetes mellitus). Exercising is not only about losing weight. It has many health benefits, such as increasing muscle strength and bone density and reducing body fat and stress. This leads to improved fitness, flexibility, and endurance, all of which result in better overall health. Exercise has additional benefits for people with diabetes, including:  Reducing appetite.  Helping to lower and control blood glucose.  Lowering blood pressure.  Helping to control amounts of fatty substances (lipids) in the blood, such as cholesterol and triglycerides.  Helping the body to respond better to insulin (improving insulin sensitivity).  Reducing how much insulin the body needs.  Decreasing the risk for heart disease by: ? Lowering cholesterol and triglyceride levels. ? Increasing the levels of good cholesterol. ? Lowering blood glucose levels.  What is my activity plan? Your health care provider or certified diabetes educator can help you make a plan for the type and frequency of exercise (activity plan) that works for you. Make sure that you:  Do at least 150 minutes of moderate-intensity or vigorous-intensity exercise each week. This could be brisk walking, biking, or water aerobics. ? Do stretching and strength exercises, such as yoga or weightlifting, at least 2 times a week. ? Spread out your activity over at least 3 days of the week.  Get some form of physical activity every day. ? Do not go more than 2 days in a row without some kind of physical activity. ? Avoid being inactive for more than 90 minutes at a time. Take frequent breaks to walk or stretch.  Choose a type of exercise or activity that you enjoy, and set realistic goals.  Start slowly, and gradually increase the intensity of your exercise over time.  What do I need to  know about managing my diabetes?  Check your blood glucose before and after exercising. ? If your blood glucose is higher than 240 mg/dL (13.3 mmol/L) before you exercise, check your urine for ketones. If you have ketones in your urine, do not exercise until your blood glucose returns to normal.  Know the symptoms of low blood glucose (hypoglycemia) and how to treat it. Your risk for hypoglycemia increases during and after exercise. Common symptoms of hypoglycemia can include: ? Hunger. ? Anxiety. ? Sweating and feeling clammy. ? Confusion. ? Dizziness or feeling light-headed. ? Increased heart rate or palpitations. ? Blurry vision. ? Tingling or numbness around the mouth, lips, or tongue. ? Tremors or shakes. ? Irritability.  Keep a rapid-acting carbohydrate snack available before, during, and after exercise to help prevent or treat hypoglycemia.  Avoid injecting insulin into areas of the body that are going to be exercised. For example, avoid injecting insulin into: ? The arms, when playing tennis. ? The legs, when jogging.  Keep records of your exercise habits. Doing this can help you and your health care provider adjust your diabetes management plan as needed. Write down: ? Food that you eat before and after you exercise. ? Blood glucose levels before and after you exercise. ? The type and amount of exercise you have done. ? When your insulin is expected to peak, if you use insulin. Avoid exercising at times when your insulin is peaking.  When you start a new exercise or activity, work with your health care provider to make sure the activity is safe for you, and to adjust your insulin, medicines, or food intake as needed.  Drink plenty of water while you exercise to prevent dehydration or heat stroke. Drink enough fluid to keep your urine clear or pale yellow. This information is not intended to replace advice given to you by your health care provider. Make sure you discuss any  questions you have with your health care provider. Document Released: 09/30/2003 Document Revised: 01/28/2016 Document Reviewed: 12/20/2015 Elsevier Interactive Patient Education  2018 [place].  +++++++++++++++++++++++++++ Recommend Adult Low Dose Aspirin or  coated  Aspirin 81 mg daily  To reduce risk of Colon Cancer 20 %,  Skin Cancer 26 % ,  Melanoma 46%  and  Pancreatic cancer 60% +++++++++++++++++++++++++ Vitamin D goal  is between 70-100.  Please make sure that you are taking your Vitamin D as directed.  It is very important as a natural anti-inflammatory  helping hair, skin, and nails, as well as reducing stroke and heart attack risk.  It helps your bones and helps with mood. It also decreases numerous cancer risks so please take it as directed.  Low Vit D is associated with a 200-300% higher risk for CANCER  and 200-300% higher risk for HEART   ATTACK  &  STROKE.   .....................................Marland Kitchen It is also associated with higher death rate at younger ages,  autoimmune diseases like Rheumatoid arthritis, Lupus, Multiple Sclerosis.    Also many other serious conditions, like depression, Alzheimer's Dementia, infertility, muscle aches, fatigue, fibromyalgia - just to name a few. ++++++++++++++++++++ Recommend the book "The END of DIETING" by Dr Excell Seltzer  & the book "The END of DIABETES " by Dr Excell Seltzer At [email or web address] - get book & Audio CD's    Being diabetic has a  300% increased risk for heart attack, stroke, cancer, and alzheimer- type vascular dementia. It is very important that you work harder with diet by avoiding all foods that are white. Avoid white rice (brown & wild rice is OK), white potatoes (sweetpotatoes in moderation is OK), White bread or wheat bread or anything made out of white flour like bagels, donuts, rolls, buns, biscuits, cakes, pastries, cookies, pizza crust, and pasta (made from white flour & egg whites) - vegetarian pasta or spinach or  wheat pasta is OK. Multigrain breads like Arnold's or Pepperidge [employer], or multigrain sandwich thins or flatbreads.  Diet, exercise and weight loss can reverse and cure diabetes in the early stages.  Diet, exercise and weight loss is very important in the control and prevention of complications of diabetes which affects every system in your body, ie. Brain - dementia/stroke, eyes - glaucoma/blindness, heart - heart attack/heart failure, kidneys - dialysis, stomach - gastric paralysis, intestines - malabsorption, nerves - severe painful neuritis, circulation - gangrene & loss of a leg(s), and finally cancer and Alzheimers.    I recommend avoid fried & greasy foods,  sweets/candy, white rice (brown or wild rice or Quinoa is OK), white potatoes (sweet potatoes are OK) - anything made from white flour - bagels, doughnuts, rolls, buns, biscuits,white and wheat breads, pizza crust and traditional pasta made of white flour & egg white(vegetarian pasta or spinach or wheat pasta is OK).  Multi-grain bread is OK - like multi-grain flat bread or sandwich thins. Avoid alcohol in excess. Exercise is also important.    Eat all the vegetables you want - avoid meat, especially red meat and dairy - especially cheese.  Cheese is the most concentrated form of trans-fats which is the worst thing to clog up our arteries. Veggie cheese is OK which  can be found in the fresh produce section at [facility] or [employer] or [employer]  +++++++++++++++++++++ DASH Eating Plan  DASH stands for "Dietary Approaches to Stop Hypertension."   The DASH eating plan is a healthy eating plan that has been shown to reduce high blood pressure (hypertension). Additional health benefits may include reducing the risk of type 2 diabetes mellitus, heart disease, and stroke. The DASH eating plan may also help with weight loss. WHAT DO I NEED TO KNOW ABOUT THE DASH EATING PLAN? For the DASH eating plan, you will follow these general  guidelines:  Choose foods with a percent daily value for sodium of less than 5% (as listed on the food label).  Use salt-free seasonings or herbs instead of table salt or sea salt.  Check with your health care provider or pharmacist before using salt substitutes.  Eat lower-sodium products, often labeled as "lower sodium" or "no salt added."  Eat fresh foods.  Eat more vegetables, fruits, and low-fat dairy products.  Choose whole grains. Look for the word "whole" as the first word in the ingredient list.  Choose fish   Limit sweets, desserts, sugars, and sugary drinks.  Choose heart-healthy fats.  Eat veggie cheese   Eat more home-cooked food and less restaurant, buffet, and fast food.  Limit fried foods.  Cook foods using methods other than frying.  Limit canned vegetables. If you do use them, rinse them well to decrease the sodium.  When eating at a restaurant, ask that your food be prepared with less salt, or no salt if possible.                      WHAT FOODS CAN I EAT? Read Dr Fara Olden Fuhrman's books on The End of Dieting & The End of Diabetes  Grains Whole grain or whole wheat bread. Brown rice. Whole grain or whole wheat pasta. Quinoa, bulgur, and whole grain cereals. Low-sodium cereals. Corn or whole wheat flour tortillas. Whole grain cornbread. Whole grain crackers. Low-sodium crackers.  Vegetables Fresh or frozen vegetables (raw, steamed, roasted, or grilled). Low-sodium or reduced-sodium tomato and vegetable juices. Low-sodium or reduced-sodium tomato sauce and paste. Low-sodium or reduced-sodium canned vegetables.   Fruits All fresh, canned (in natural juice), or frozen fruits.  Protein Products  All fish and seafood.  Dried beans, peas, or lentils. Unsalted nuts and seeds. Unsalted canned beans.  Dairy Low-fat dairy products, such as skim or 1% milk, 2% or reduced-fat cheeses, low-fat ricotta or cottage cheese, or plain low-fat yogurt. Low-sodium or  reduced-sodium cheeses.  Fats and Oils Tub margarines without trans fats. Light or reduced-fat mayonnaise and salad dressings (reduced sodium). Avocado. Safflower, olive, or canola oils. Natural peanut or almond butter.  Other Unsalted popcorn and pretzels. The items listed above may not be a complete list of recommended foods or beverages. Contact your dietitian for more options.  +++++++++++++++  WHAT FOODS ARE NOT RECOMMENDED? Grains/ White flour or wheat flour White bread. White pasta. White rice. Refined cornbread. Bagels and croissants. Crackers that contain trans fat.  Vegetables  Creamed or fried vegetables. Vegetables in a . Regular canned vegetables. Regular canned tomato sauce and paste. Regular tomato and vegetable juices.  Fruits Dried fruits. Canned fruit in light or heavy syrup. Fruit juice.  Meat and Other Protein Products Meat in general - RED meat & White meat.  Fatty cuts of meat. Ribs, chicken wings, all processed meats as bacon, sausage, bologna, salami, fatback, hot dogs, bratwurst  and packaged luncheon meats.  Dairy Whole or 2% milk, cream, half-and-half, and cream cheese. Whole-fat or sweetened yogurt. Full-fat cheeses or blue cheese. Non-dairy creamers and whipped toppings. Processed cheese, cheese spreads, or cheese curds.  Condiments Onion and garlic salt, seasoned salt, table salt, and sea salt. Canned and packaged gravies. Worcestershire sauce. Tartar sauce. Barbecue sauce. Teriyaki sauce. Soy sauce, including reduced sodium. Steak sauce. Fish sauce. Oyster sauce. Cocktail sauce. Horseradish. Ketchup and mustard. Meat flavorings and tenderizers. Bouillon cubes. Hot sauce. Tabasco sauce. Marinades. Taco seasonings. Relishes.  Fats and Oils Butter, stick margarine, lard, shortening and bacon fat. Coconut, palm kernel, or palm oils. Regular salad dressings.  Pickles and olives. Salted popcorn and pretzels.  The items listed above may not be a complete  list of foods and beverages to avoid.

## 2017-03-05 ENCOUNTER — Ambulatory Visit (INDEPENDENT_AMBULATORY_CARE_PROVIDER_SITE_OTHER): Payer: Managed Care, Other (non HMO) | Admitting: Internal Medicine

## 2017-03-05 ENCOUNTER — Encounter: Payer: Self-pay | Admitting: Internal Medicine

## 2017-03-05 VITALS — BP 114/82 | HR 80 | Temp 96.6°F | Resp 16 | Ht 68.5 in | Wt 242.6 lb

## 2017-03-05 DIAGNOSIS — I10 Essential (primary) hypertension: Secondary | ICD-10-CM | POA: Diagnosis not present

## 2017-03-05 DIAGNOSIS — Z91199 Patient's noncompliance with other medical treatment and regimen due to unspecified reason: Secondary | ICD-10-CM

## 2017-03-05 DIAGNOSIS — M1000 Idiopathic gout, unspecified site: Secondary | ICD-10-CM

## 2017-03-05 DIAGNOSIS — Z9119 Patient's noncompliance with other medical treatment and regimen: Secondary | ICD-10-CM | POA: Diagnosis not present

## 2017-03-05 DIAGNOSIS — N183 Chronic kidney disease, stage 3 unspecified: Secondary | ICD-10-CM

## 2017-03-05 DIAGNOSIS — E782 Mixed hyperlipidemia: Secondary | ICD-10-CM | POA: Diagnosis not present

## 2017-03-05 DIAGNOSIS — E559 Vitamin D deficiency, unspecified: Secondary | ICD-10-CM | POA: Diagnosis not present

## 2017-03-05 DIAGNOSIS — E291 Testicular hypofunction: Secondary | ICD-10-CM | POA: Diagnosis not present

## 2017-03-05 DIAGNOSIS — E1122 Type 2 diabetes mellitus with diabetic chronic kidney disease: Secondary | ICD-10-CM | POA: Diagnosis not present

## 2017-03-05 DIAGNOSIS — K219 Gastro-esophageal reflux disease without esophagitis: Secondary | ICD-10-CM | POA: Diagnosis not present

## 2017-03-05 DIAGNOSIS — Z79899 Other long term (current) drug therapy: Secondary | ICD-10-CM | POA: Diagnosis not present

## 2017-03-05 LAB — CBC WITH DIFFERENTIAL/PLATELET
Basophils Absolute: 0 cells/uL (ref 0–200)
Basophils Relative: 0 %
Eosinophils Absolute: 152 cells/uL (ref 15–500)
Eosinophils Relative: 2 %
HCT: 51.6 % — ABNORMAL HIGH (ref 38.5–50.0)
Hemoglobin: 17.4 g/dL — ABNORMAL HIGH (ref 13.2–17.1)
Lymphocytes Relative: 22 %
Lymphs Abs: 1672 cells/uL (ref 850–3900)
MCH: 28.7 pg (ref 27.0–33.0)
MCHC: 33.7 g/dL (ref 32.0–36.0)
MCV: 85.1 fL (ref 80.0–100.0)
MPV: 9.3 fL (ref 7.5–12.5)
Monocytes Absolute: 608 cells/uL (ref 200–950)
Monocytes Relative: 8 %
Neutro Abs: 5168 cells/uL (ref 1500–7800)
Neutrophils Relative %: 68 %
Platelets: 166 10*3/uL (ref 140–400)
RBC: 6.06 MIL/uL — ABNORMAL HIGH (ref 4.20–5.80)
RDW: 14.3 % (ref 11.0–15.0)
WBC: 7.6 10*3/uL (ref 3.8–10.8)

## 2017-03-05 LAB — LIPID PANEL
Cholesterol: 161 mg/dL (ref <200)
HDL: 28 mg/dL — ABNORMAL LOW (ref >40)
LDL Cholesterol: 64 mg/dL (ref <100)
Total CHOL/HDL Ratio: 5.8 Ratio — ABNORMAL HIGH (ref <5.0)
Triglycerides: 345 mg/dL — ABNORMAL HIGH (ref <150)
VLDL: 69 mg/dL — ABNORMAL HIGH (ref <30)

## 2017-03-05 LAB — BASIC METABOLIC PANEL WITH GFR
BUN: 19 mg/dL (ref 7–25)
CO2: 22 mmol/L (ref 20–32)
Calcium: 9.1 mg/dL (ref 8.6–10.3)
Chloride: 98 mmol/L (ref 98–110)
Creat: 1.12 mg/dL (ref 0.70–1.25)
GFR, Est African American: 77 mL/min (ref >=60)
GFR, Est Non African American: 67 mL/min (ref >=60)
Glucose, Bld: 213 mg/dL — ABNORMAL HIGH (ref 65–99)
Potassium: 4.1 mmol/L (ref 3.5–5.3)
Sodium: 136 mmol/L (ref 135–146)

## 2017-03-05 LAB — HEPATIC FUNCTION PANEL
ALT: 15 U/L (ref 9–46)
AST: 14 U/L (ref 10–35)
Albumin: 3.9 g/dL (ref 3.6–5.1)
Alkaline Phosphatase: 55 U/L (ref 40–115)
Bilirubin, Direct: 0.1 mg/dL (ref <=0.2)
Indirect Bilirubin: 0.7 mg/dL (ref 0.2–1.2)
Total Bilirubin: 0.8 mg/dL (ref 0.2–1.2)
Total Protein: 6 g/dL — ABNORMAL LOW (ref 6.1–8.1)

## 2017-03-05 LAB — TSH: TSH: 2.02 mIU/L (ref 0.40–4.50)

## 2017-03-06 LAB — HEMOGLOBIN A1C
Hgb A1c MFr Bld: 9.5 % — ABNORMAL HIGH (ref <5.7)
Mean Plasma Glucose: 226 mg/dL

## 2017-03-06 LAB — VITAMIN D 25 HYDROXY (VIT D DEFICIENCY, FRACTURES): Vit D, 25-Hydroxy: 41 ng/mL (ref 30–100)

## 2017-03-06 LAB — URIC ACID: Uric Acid, Serum: 5.6 mg/dL (ref 4.0–8.0)

## 2017-03-06 LAB — TESTOSTERONE: Testosterone: 247 ng/dL — ABNORMAL LOW (ref 250–827)

## 2017-03-06 LAB — MAGNESIUM: Magnesium: 1.5 mg/dL (ref 1.5–2.5)

## 2017-03-06 LAB — INSULIN, RANDOM: Insulin: 14.8 u[IU]/mL (ref 2.0–19.6)

## 2017-03-14 ENCOUNTER — Other Ambulatory Visit: Payer: Self-pay | Admitting: Internal Medicine

## 2017-03-29 ENCOUNTER — Other Ambulatory Visit: Payer: Self-pay | Admitting: Internal Medicine

## 2017-04-05 ENCOUNTER — Other Ambulatory Visit: Payer: Self-pay | Admitting: *Deleted

## 2017-04-05 MED ORDER — LOSARTAN POTASSIUM 100 MG PO TABS: 100.0000 mg | ORAL_TABLET | Freq: Every day | ORAL | 1 refills | Status: DC

## 2017-04-11 ENCOUNTER — Other Ambulatory Visit: Payer: Self-pay | Admitting: Physician Assistant

## 2017-04-16 LAB — HM DIABETES EYE EXAM

## 2017-06-10 DIAGNOSIS — E1122 Type 2 diabetes mellitus with diabetic chronic kidney disease: Secondary | ICD-10-CM | POA: Insufficient documentation

## 2017-06-10 DIAGNOSIS — N183 Chronic kidney disease, stage 3 unspecified: Secondary | ICD-10-CM | POA: Insufficient documentation

## 2017-06-10 NOTE — Progress Notes (Signed)
FOLLOW UP  Assessment and Plan:   Hypertension Patient had not taken BP meds prior to this visit - long discussion about medication adherence Monitor blood pressure at home; patient to call if consistently greater than 130/80 Continue DASH diet.   Reminder to go to the ER if any CP, SOB, nausea, dizziness, severe HA, changes vision/speech, left arm numbness and tingling and jaw pain.  Cholesterol The patient stopped previously prescribed medication; resistant to medication for perceived lack of benefit- discussed triglycerides are very diet dependent and difficult to outtreat his dietary choices.  Encouraged low cholesterol diet and exercise.  Check lipid panel.   Diabetes with diabetic chronic kidney disease Continue medications Continue diet and exercise.  Perform daily foot/skin check, notify office of any concerning changes.  Check A1C  Stage 2 CKD Increase fluids, avoid NSAIDS, monitor sugars, will monitor  Hypogonadism Continued benefits noted without SE; continue replacement therapy check testosterone levels as needed- defer today as he just injected 4 days ago.   Obesity with co morbidities Long discussion about weight loss, diet, and exercise He is prescribed phentermine; taking with routine breaks and is losing weight Discussed ideal weight for height and weight goal for next follow up (232 lb) Patient will work on continuing with dietary changes - whole grains, increased vegetables, etc Will follow up in 3 months  Vitamin D Def/ osteoporosis prevention Continue supplementation Check Vit D level  Continue diet and meds as discussed. Further disposition pending results of labs. Discussed med's effects and SE's.   Over 30 minutes of exam, counseling, chart review, and critical decision making was performed.   Future Appointments  Date Time Provider [place]  09/12/2017 10:00 AM Unk Pinto, MD [facility] None     ----------------------------------------------------------------------------------------------------------------------  HPI 71 y.o. male  presents for 3 month follow up on hypertension, cholesterol, diabetes, obesity, hypogonadism and vitamin D deficiency. Patient has been out of town for this past weekend for [employer] wedding - he reports he has been off of his medications and has not taken his BP medications this AM. Discussed bringing the patient back in 2 weeks for a BP recheck - patient refuses. Discussed cholesterol and why he is currently not taking medication- patient reports he was previously on medication but stopped taking for perceived lack of benefit/improvement.   he is prescribed phentermine for weight loss.  While on the medication they have lost 5 lbs since last visit. They deny palpitations, anxiety, trouble sleeping, elevated BP. He takes during the week only, and doesn't take for a week at a time routinely for breaks.   BMI is Body mass index is 35.51 kg/m., he is working on diet and exercise. Wt Readings from Last 3 Encounters:  06/11/17 237 lb (107.5 kg)  03/05/17 242 lb 9.6 oz (110 kg)  01/29/17 242 lb (109.8 kg)    He does not check his BP at home, today their BP is BP: (!) 158/94  He does not workout. He denies chest pain, shortness of breath, dizziness.  He has a history of testosterone deficiency and is on testosterone replacement. He states that the testosterone helps with his energy, libido, muscle mass. His most recent testosterone shot was 4 days ago.  Lab Results  Component Value Date   TESTOSTERONE 247 (L) 03/05/2017    He is not on cholesterol medication and denies myalgias. His cholesterol is not at goal. The cholesterol last visit was:   Lab Results  Component Value Date   CHOL 161  03/05/2017   HDL 28 (L) 03/05/2017   LDLCALC 64 03/05/2017   TRIG 345 (H) 03/05/2017   CHOLHDL 5.8 (H) 03/05/2017    He has been working on diet somewhat, not  exercising at all - for diabetes, and denies hyperglycemia, hypoglycemia , increased appetite, nausea, paresthesia of the feet, polydipsia, polyuria, visual disturbances and vomiting. Last A1C in the office was:  Lab Results  Component Value Date   HGBA1C 9.5 (H) 03/05/2017   Patient is on Vitamin D supplement but remains below goal of 70:  Lab Results  Component Value Date   VD25OH 41 03/05/2017        Current Medications:  Current Outpatient Medications on File Prior to Visit  Medication Sig  . atenolol (TENORMIN) 100 MG tablet TAKE 1 TABLET BY MOUTH DAILY  . B-D 3CC LUER-LOK SYR 21GX1" 21G X 1" 3 ML MISC use as directed  . Blood Glucose Monitoring Suppl (ONETOUCH VERIO IQ SYSTEM) W/DEVICE KIT Patient test blood sugar once daily  . cholecalciferol (VITAMIN D) 1000 units tablet Take 5,000 Units daily by mouth.  Marland Kitchen glucose blood (ONETOUCH VERIO) test strip Patient to check blood sugar once daily  . glyBURIDE (DIABETA) 5 MG tablet TAKE 1/2 TO 1 TABLET BY MOUTH 3 TIMES A DAY AFTER MEALS  . hydrochlorothiazide (HYDRODIURIL) 25 MG tablet take 1 tablet by mouth every morning for blood pressure  . JANUMET 50-500 MG tablet take 1 tablet by mouth twice a day with food  . Lancets MISC Patient to check blood sugar once daily  . losartan (COZAAR) 100 MG tablet Take 1 tablet (100 mg total) by mouth daily.  . Magnesium 250 MG TABS Take by mouth 2 (two) times daily.  . meloxicam (MOBIC) 15 MG tablet take 1 tablet by mouth once daily with food  . phentermine (ADIPEX-P) 37.5 MG tablet take 1/2 to 1 tablet by mouth every morning FOR DIETING AND WEIGHT LOSS  . ranitidine (ZANTAC) 300 MG capsule TAKE 1 CAPSULE BY MOUTH ONCE DAILY  . tamsulosin (FLOMAX) 0.4 MG CAPS capsule Take 1 capsule daily for Prostate  . testosterone cypionate (DEPOTESTOSTERONE CYPIONATE) 200 MG/ML injection INJECT 2 MILLILITERS INTRAMUSCULARLY EVERY 2 WEEKS AS DIRECTED   No current facility-administered medications on file prior to  visit.      Allergies:  Allergies  Allergen Reactions  . Ace Inhibitors   . Ibuprofen     GI upset  . Invokana [Canagliflozin]   . Metformin And Related     Diarrhea  . Quinidine      Medical History:  Past Medical History:  Diagnosis Date  . BPH (benign prostatic hypertrophy)   . Diabetes mellitus without complication ([place])   . GERD (gastroesophageal reflux disease)   . Hypertension   . Hypogonadism male   . OSA (obstructive sleep apnea)   . Vitamin D deficiency    Family history- Reviewed and unchanged Social history- Reviewed and unchanged   Review of Systems:  Review of Systems  Constitutional: Negative for malaise/fatigue and weight loss.  HENT: Negative for hearing loss and tinnitus.   Eyes: Negative for blurred vision and double vision.  Respiratory: Negative for cough, shortness of breath and wheezing.   Cardiovascular: Negative for chest pain, palpitations, orthopnea, claudication and leg swelling.  Gastrointestinal: Negative for abdominal pain, blood in stool, constipation, diarrhea, heartburn, melena, nausea and vomiting.  Genitourinary: Negative.   Musculoskeletal: Negative for joint pain and myalgias.  Skin: Negative for rash.  Neurological: Negative for dizziness, tingling,  sensory change, weakness and headaches.  Endo/Heme/Allergies: Negative for polydipsia.  Psychiatric/Behavioral: Negative.   All other systems reviewed and are negative.     Physical Exam: BP (!) 158/94   Pulse 77   Temp 97.9 F (36.6 C)   Wt 237 lb (107.5 kg)   SpO2 97%   BMI 35.51 kg/m  Wt Readings from Last 3 Encounters:  06/11/17 237 lb (107.5 kg)  03/05/17 242 lb 9.6 oz (110 kg)  01/29/17 242 lb (109.8 kg)   General Appearance: Well nourished, in no apparent distress. Eyes: PERRLA, EOMs, conjunctiva no swelling or erythema Sinuses: No Frontal/maxillary tenderness ENT/Mouth: Ext aud canals clear, TMs without erythema, bulging. No erythema, swelling, or exudate on  post pharynx.  Tonsils not swollen or erythematous. Hearing normal.  Neck: Supple, thyroid normal.  Respiratory: Respiratory effort normal, BS equal bilaterally without rales, rhonchi, wheezing or stridor.  Cardio: RRR with no MRGs. Brisk peripheral pulses without edema.  Abdomen: Soft, + BS.  Non tender, no guarding, rebound, hernias, masses. Lymphatics: Non tender without lymphadenopathy.  Musculoskeletal: Full ROM, 5/5 strength, Normal gait Skin: Warm, dry without rashes, lesions, ecchymosis.  Neuro: Cranial nerves intact. No cerebellar symptoms.  Psych: Awake and oriented X 3, normal affect, Insight and Judgment appropriate.    Izora Ribas, NP 10:01 AM [facility]

## 2017-06-11 ENCOUNTER — Ambulatory Visit: Payer: Managed Care, Other (non HMO) | Admitting: Adult Health

## 2017-06-11 ENCOUNTER — Encounter: Payer: Self-pay | Admitting: Adult Health

## 2017-06-11 VITALS — BP 158/94 | HR 77 | Temp 97.9°F | Wt 237.0 lb

## 2017-06-11 DIAGNOSIS — N183 Chronic kidney disease, stage 3 unspecified: Secondary | ICD-10-CM

## 2017-06-11 DIAGNOSIS — N182 Chronic kidney disease, stage 2 (mild): Secondary | ICD-10-CM

## 2017-06-11 DIAGNOSIS — I10 Essential (primary) hypertension: Secondary | ICD-10-CM | POA: Diagnosis not present

## 2017-06-11 DIAGNOSIS — E559 Vitamin D deficiency, unspecified: Secondary | ICD-10-CM | POA: Diagnosis not present

## 2017-06-11 DIAGNOSIS — E291 Testicular hypofunction: Secondary | ICD-10-CM

## 2017-06-11 DIAGNOSIS — Z23 Encounter for immunization: Secondary | ICD-10-CM | POA: Diagnosis not present

## 2017-06-11 DIAGNOSIS — E1122 Type 2 diabetes mellitus with diabetic chronic kidney disease: Secondary | ICD-10-CM | POA: Diagnosis not present

## 2017-06-11 DIAGNOSIS — E782 Mixed hyperlipidemia: Secondary | ICD-10-CM

## 2017-06-11 DIAGNOSIS — Z79899 Other long term (current) drug therapy: Secondary | ICD-10-CM

## 2017-06-11 DIAGNOSIS — Z6841 Body Mass Index (BMI) 40.0 and over, adult: Secondary | ICD-10-CM

## 2017-06-12 ENCOUNTER — Encounter: Payer: Self-pay | Admitting: Adult Health

## 2017-06-12 ENCOUNTER — Other Ambulatory Visit: Payer: Self-pay | Admitting: Adult Health

## 2017-06-12 DIAGNOSIS — D751 Secondary polycythemia: Secondary | ICD-10-CM | POA: Insufficient documentation

## 2017-06-12 LAB — HEPATIC FUNCTION PANEL
AG Ratio: 1.7 (calc) (ref 1.0–2.5)
ALT: 18 U/L (ref 9–46)
AST: 13 U/L (ref 10–35)
Albumin: 4 g/dL (ref 3.6–5.1)
Alkaline phosphatase (APISO): 57 U/L (ref 40–115)
Bilirubin, Direct: 0.2 mg/dL (ref 0.0–0.2)
Globulin: 2.3 g/dL (calc) (ref 1.9–3.7)
Indirect Bilirubin: 1 mg/dL (calc) (ref 0.2–1.2)
Total Bilirubin: 1.2 mg/dL (ref 0.2–1.2)
Total Protein: 6.3 g/dL (ref 6.1–8.1)

## 2017-06-12 LAB — CBC WITH DIFFERENTIAL/PLATELET
Basophils Absolute: 118 cells/uL (ref 0–200)
Basophils Relative: 1.1 %
Eosinophils Absolute: 214 cells/uL (ref 15–500)
Eosinophils Relative: 2 %
HCT: 53.8 % — ABNORMAL HIGH (ref 38.5–50.0)
Hemoglobin: 18.1 g/dL — ABNORMAL HIGH (ref 13.2–17.1)
Lymphs Abs: 2172 cells/uL (ref 850–3900)
MCH: 28 pg (ref 27.0–33.0)
MCHC: 33.6 g/dL (ref 32.0–36.0)
MCV: 83.2 fL (ref 80.0–100.0)
MPV: 9.6 fL (ref 7.5–12.5)
Monocytes Relative: 6.3 %
Neutro Abs: 7522 cells/uL (ref 1500–7800)
Neutrophils Relative %: 70.3 %
Platelets: 169 10*3/uL (ref 140–400)
RBC: 6.47 10*6/uL — ABNORMAL HIGH (ref 4.20–5.80)
RDW: 14.3 % (ref 11.0–15.0)
Total Lymphocyte: 20.3 %
WBC mixed population: 674 cells/uL (ref 200–950)
WBC: 10.7 10*3/uL (ref 3.8–10.8)

## 2017-06-12 LAB — HEMOGLOBIN A1C
Hgb A1c MFr Bld: 8.7 % of total Hgb — ABNORMAL HIGH (ref <5.7)
Mean Plasma Glucose: 203 (calc)
eAG (mmol/L): 11.2 (calc)

## 2017-06-12 LAB — LIPID PANEL
Cholesterol: 167 mg/dL (ref <200)
HDL: 27 mg/dL — ABNORMAL LOW (ref >40)
LDL Cholesterol (Calc): 105 mg/dL (calc) — ABNORMAL HIGH
Non-HDL Cholesterol (Calc): 140 mg/dL (calc) — ABNORMAL HIGH (ref <130)
Total CHOL/HDL Ratio: 6.2 (calc) — ABNORMAL HIGH (ref <5.0)
Triglycerides: 248 mg/dL — ABNORMAL HIGH (ref <150)

## 2017-06-12 LAB — BASIC METABOLIC PANEL WITH GFR
BUN: 18 mg/dL (ref 7–25)
CO2: 29 mmol/L (ref 20–32)
Calcium: 9.5 mg/dL (ref 8.6–10.3)
Chloride: 98 mmol/L (ref 98–110)
Creat: 1.11 mg/dL (ref 0.70–1.25)
GFR, Est African American: 78 mL/min/{1.73_m2} (ref >=60)
GFR, Est Non African American: 67 mL/min/{1.73_m2} (ref >=60)
Glucose, Bld: 228 mg/dL — ABNORMAL HIGH (ref 65–99)
Potassium: 4.1 mmol/L (ref 3.5–5.3)
Sodium: 136 mmol/L (ref 135–146)

## 2017-06-12 LAB — VITAMIN D 25 HYDROXY (VIT D DEFICIENCY, FRACTURES): Vit D, 25-Hydroxy: 60 ng/mL (ref 30–100)

## 2017-06-12 LAB — TSH: TSH: 1.91 mIU/L (ref 0.40–4.50)

## 2017-06-15 ENCOUNTER — Other Ambulatory Visit: Payer: Self-pay | Admitting: Internal Medicine

## 2017-07-24 HISTORY — PX: HAND SURGERY: SHX662

## 2017-07-25 ENCOUNTER — Other Ambulatory Visit: Payer: Self-pay | Admitting: Internal Medicine

## 2017-07-31 ENCOUNTER — Ambulatory Visit: Payer: Managed Care, Other (non HMO) | Admitting: Internal Medicine

## 2017-07-31 VITALS — BP 148/90 | HR 72 | Temp 97.0°F | Resp 18 | Ht 68.5 in | Wt 231.2 lb

## 2017-07-31 DIAGNOSIS — M5432 Sciatica, left side: Secondary | ICD-10-CM | POA: Diagnosis not present

## 2017-07-31 MED ORDER — GABAPENTIN 600 MG PO TABS: ORAL_TABLET | ORAL | 1 refills | Status: DC

## 2017-07-31 NOTE — Progress Notes (Signed)
  Subjective:    Patient ID: Phillip Powell, male    DOB: 07/24/1946, 72 y.o.   MRN: [id number]  HPI  This nice 72 yo MWM w/ HTN, HLD and poorly controlled T2_DM presents as a work-in for worsening  LLE pain x 4-5 days duration which he describes as a deep ache. He denies injury and reports the pain is partially relieved with lying down. (wears wallet in Left rear pocket)   Medication Sig  . atenolol  100 MG tablet TAKE 1 TABLET BY MOUTH DAILY  . VIT D 1000 units Take 2,000 Units by mouth 2 (two) times daily.   Marland Kitchen glyBURIDE  5 MG  TAKE 1/2 TO 1 TABLET BY MOUTH 3 TIMES A DAY AFTER MEALS  . HCTZ  25 MG tablet TAKE 1 TAB EVERY MORNING   . JANUMET 50-500 MG take 1 tablet by mouth twice a day with food  . losartan100 MG  Take 1 tablet (100 mg total) by mouth daily.  . Magnesium 250 MG  Take by mouth 2 (two) times daily.  . meloxicam 15 MG  TAKE 1 TABLET BY MOUTH DAILY WITH FOOD  . phentermine 37.5 MG  take 1/2 to 1 tab every morning FOR DIETING AND WEIGHT LOSS  . ranitidine 300 MG  TAKE 1 CAPSULE BY MOUTH ONCE DAILY  . tamsulosin  0.4 MG Take 1 capsule daily for Prostate   Allergies  Allergen Reactions  . Ace Inhibitors   . Ibuprofen     GI upset  . Invokana [Canagliflozin]   . Metformin And Related     Diarrhea  . Quinidine    Past Medical History:  Diagnosis Date  . BPH (benign prostatic hypertrophy)   . Diabetes mellitus without complication ([place])   . GERD (gastroesophageal reflux disease)   . Hypertension   . Hypogonadism male   . OSA (obstructive sleep apnea)   . Vitamin D deficiency    Past Surgical History:  Procedure Laterality Date  . HERNIA REPAIR  3818   umbilical  . ROTATOR CUFF REPAIR Left 1996  . ROTATOR CUFF REPAIR Right 1997  . TONSILLECTOMY  1952   Review of Systems  10 point systems review negative except as above.    Objective:   Physical Exam  BP (!) 148/90   Pulse 72   Temp (!) 97 F (36.1 C)   Resp 18   Ht 5' 8.5" (1.74 m)   Wt 231 lb 3.2 oz  (104.9 kg)   BMI 34.64 kg/m   HEENT - WNL. Neck - supple.  Chest - Clear equal BS. Cor - Nl HS. RRR w/o sig MGR. PP 2(+). No edema. Nl capillary refill. Lt foot/toes warm.  MS- FROM w/o deformities.  Sl limping Gait. (-) SLR  Neuro -  Nl w/o focal abnormalities. Localizes pain to lower anterior Left shin above the ankle. Lt KJ & AJ Nl.  Skin - No rash.     Assessment & Plan:    1. Left sided sciatica  - doubt Diabetic Neuropathy. Possible shingles (cautiond to monitor for rash)   - Rx Gabapentin 600 mg #90 x 1 Rf. Take 1/2 to 1 tab 2-3 x/day - with precautions not to drive   - Patient adamantly refuses to have a MRI.   - Advised switch wallet to Rt rear pocket   - OK'd OOW note x 5 days

## 2017-07-31 NOTE — Patient Instructions (Signed)
Sciatica Sciatica is pain, numbness, weakness, or tingling along the path of the sciatic nerve. The sciatic nerve starts in the lower back and runs down the back of each leg. The nerve controls the muscles in the lower leg and in the back of the knee. It also provides feeling (sensation) to the back of the thigh, the lower leg, and the sole of the foot. Sciatica is a symptom of another medical condition that pinches or puts pressure on the sciatic nerve. Generally, sciatica only affects one side of the body. Sciatica usually goes away on its own or with treatment. In some cases, sciatica may keep coming back (recur). What are the causes? This condition is caused by pressure on the sciatic nerve, or pinching of the sciatic nerve. This may be the result of:  A disk in between the bones of the spine (vertebrae) bulging out too far (herniated disk).  Age-related changes in the spinal disks (degenerative disk disease).  A pain disorder that affects a muscle in the buttock (piriformis syndrome).  Extra bone growth (bone spur) near the sciatic nerve.  An injury or break (fracture) of the pelvis.  Pregnancy.  Tumor (rare).  What increases the risk? The following factors may make you more likely to develop this condition:  Playing sports that place pressure or stress on the spine, such as football or weight lifting.  Having poor strength and flexibility.  A history of back injury.  A history of back surgery.  Sitting for long periods of time.  Doing activities that involve repetitive bending or lifting.  Obesity.  What are the signs or symptoms? Symptoms can vary from mild to very severe, and they may include:  Any of these problems in the lower back, leg, hip, or buttock: ? Mild tingling or dull aches. ? Burning sensations. ? Sharp pains.  Numbness in the back of the calf or the sole of the foot.  Leg weakness.  Severe back pain that makes movement difficult.  These  symptoms may get worse when you cough, sneeze, or laugh, or when you sit or stand for long periods of time. Being overweight may also make symptoms worse. In some cases, symptoms may recur over time. How is this diagnosed? This condition may be diagnosed based on:  Your symptoms.  A physical exam. Your health care provider may ask you to do certain movements to check whether those movements trigger your symptoms.  You may have tests, including: ? Blood tests. ? X-rays. ? MRI. ? CT scan.  How is this treated? In many cases, this condition improves on its own, without any treatment. However, treatment may include:  Reducing or modifying physical activity during periods of pain.  Exercising and stretching to strengthen your abdomen and improve the flexibility of your spine.  Icing and applying heat to the affected area.  Medicines that help: ? To relieve pain and swelling. ? To relax your muscles.  Injections of medicines that help to relieve pain, irritation, and inflammation around the sciatic nerve (steroids).  Surgery.  Follow these instructions at home: Medicines  Take over-the-counter and prescription medicines only as told by your health care provider.  Do not drive or operate heavy machinery while taking prescription pain medicine. Managing pain  If directed, apply ice to the affected area. ? Put ice in a plastic bag. ? Place a towel between your skin and the bag. ? Leave the ice on for 20 minutes, 2-3 times a day.  After icing, apply   heat to the affected area before you exercise or as often as told by your health care provider. Use the heat source that your health care provider recommends, such as a moist heat pack or a heating pad. ? Place a towel between your skin and the heat source. ? Leave the heat on for 20-30 minutes. ? Remove the heat if your skin turns bright red. This is especially important if you are unable to feel pain, heat, or cold. You may have a  greater risk of getting burned. Activity  Return to your normal activities as told by your health care provider. Ask your health care provider what activities are safe for you. ? Avoid activities that make your symptoms worse.  Take brief periods of rest throughout the day. Resting in a lying or standing position is usually better than sitting to rest. ? When you rest for longer periods, mix in some mild activity or stretching between periods of rest. This will help to prevent stiffness and pain. ? Avoid sitting for long periods of time without moving. Get up and move around at least one time each hour.  Exercise and stretch regularly, as told by your health care provider.  Do not lift anything that is heavier than 10 lb (4.5 kg) while you have symptoms of sciatica. When you do not have symptoms, you should still avoid heavy lifting, especially repetitive heavy lifting.  When you lift objects, always use proper lifting technique, which includes: ? Bending your knees. ? Keeping the load close to your body. ? Avoiding twisting. General instructions  Use good posture. ? Avoid leaning forward while sitting. ? Avoid hunching over while standing.  Maintain a healthy weight. Excess weight puts extra stress on your back and makes it difficult to maintain good posture.  Wear supportive, comfortable shoes. Avoid wearing high heels.  Avoid sleeping on a mattress that is too soft or too hard. A mattress that is firm enough to support your back when you sleep may help to reduce your pain.  Keep all follow-up visits as told by your health care provider. This is important. Contact a health care provider if:  You have pain that wakes you up when you are sleeping.  You have pain that gets worse when you lie down.  Your pain is worse than you have experienced in the past.  Your pain lasts longer than 4 weeks.  You experience unexplained weight loss. Get help right away if:  You lose control  of your bowel or bladder (incontinence).  You have: ? Weakness in your lower back, pelvis, buttocks, or legs that gets worse. ? Redness or swelling of your back. ? A burning sensation when you urinate. This information is not intended to replace advice given to you by your health care provider. Make sure you discuss any questions you have with your health care provider. Document Released: 07/04/2001 Document Revised: 12/14/2015 Document Reviewed: 03/19/2015 Elsevier Interactive Patient Education  2018 [employer]  

## 2017-08-02 ENCOUNTER — Other Ambulatory Visit: Payer: Self-pay | Admitting: Internal Medicine

## 2017-08-02 ENCOUNTER — Telehealth: Payer: Self-pay | Admitting: Internal Medicine

## 2017-08-02 DIAGNOSIS — M5432 Sciatica, left side: Secondary | ICD-10-CM

## 2017-08-02 DIAGNOSIS — M5442 Lumbago with sciatica, left side: Secondary | ICD-10-CM

## 2017-08-02 NOTE — Telephone Encounter (Signed)
[name], spouse, called for patient to request OPEN MRI for left leg & low back severe pain. Please advise. Also states medicine for nerves would be required for patient.

## 2017-08-09 ENCOUNTER — Other Ambulatory Visit: Payer: Self-pay | Admitting: Internal Medicine

## 2017-08-18 ENCOUNTER — Other Ambulatory Visit: Payer: Self-pay | Admitting: Physician Assistant

## 2017-09-02 ENCOUNTER — Other Ambulatory Visit: Payer: Self-pay | Admitting: Internal Medicine

## 2017-09-12 ENCOUNTER — Ambulatory Visit: Payer: Managed Care, Other (non HMO) | Admitting: Internal Medicine

## 2017-09-12 ENCOUNTER — Encounter: Payer: Self-pay | Admitting: Internal Medicine

## 2017-09-12 VITALS — BP 158/96 | HR 76 | Temp 97.7°F | Resp 18 | Ht 68.0 in | Wt 233.6 lb

## 2017-09-12 DIAGNOSIS — Z0000 Encounter for general adult medical examination without abnormal findings: Secondary | ICD-10-CM

## 2017-09-12 DIAGNOSIS — Z0001 Encounter for general adult medical examination with abnormal findings: Secondary | ICD-10-CM

## 2017-09-12 DIAGNOSIS — Z79899 Other long term (current) drug therapy: Secondary | ICD-10-CM | POA: Diagnosis not present

## 2017-09-12 DIAGNOSIS — I10 Essential (primary) hypertension: Secondary | ICD-10-CM

## 2017-09-12 DIAGNOSIS — M1000 Idiopathic gout, unspecified site: Secondary | ICD-10-CM

## 2017-09-12 DIAGNOSIS — E782 Mixed hyperlipidemia: Secondary | ICD-10-CM

## 2017-09-12 DIAGNOSIS — E559 Vitamin D deficiency, unspecified: Secondary | ICD-10-CM | POA: Diagnosis not present

## 2017-09-12 DIAGNOSIS — I700 Atherosclerosis of aorta: Secondary | ICD-10-CM

## 2017-09-12 DIAGNOSIS — Z136 Encounter for screening for cardiovascular disorders: Secondary | ICD-10-CM

## 2017-09-12 DIAGNOSIS — Z1212 Encounter for screening for malignant neoplasm of rectum: Secondary | ICD-10-CM

## 2017-09-12 DIAGNOSIS — E291 Testicular hypofunction: Secondary | ICD-10-CM

## 2017-09-12 DIAGNOSIS — N182 Chronic kidney disease, stage 2 (mild): Secondary | ICD-10-CM

## 2017-09-12 DIAGNOSIS — Z125 Encounter for screening for malignant neoplasm of prostate: Secondary | ICD-10-CM | POA: Diagnosis not present

## 2017-09-12 DIAGNOSIS — E1122 Type 2 diabetes mellitus with diabetic chronic kidney disease: Secondary | ICD-10-CM

## 2017-09-12 DIAGNOSIS — R632 Polyphagia: Secondary | ICD-10-CM

## 2017-09-12 DIAGNOSIS — K219 Gastro-esophageal reflux disease without esophagitis: Secondary | ICD-10-CM

## 2017-09-12 DIAGNOSIS — R5383 Other fatigue: Secondary | ICD-10-CM

## 2017-09-12 DIAGNOSIS — Z1211 Encounter for screening for malignant neoplasm of colon: Secondary | ICD-10-CM

## 2017-09-12 DIAGNOSIS — G4733 Obstructive sleep apnea (adult) (pediatric): Secondary | ICD-10-CM

## 2017-09-12 DIAGNOSIS — Z6835 Body mass index (BMI) 35.0-35.9, adult: Secondary | ICD-10-CM

## 2017-09-12 NOTE — Progress Notes (Signed)
[facility]   Unk Pinto, M.D.     Uvaldo Bristle. Silverio Lay, P.A.-C Liane [address], [address]                [place] [address] Telephone [phone] Telefax [phone] Annual  Screening/Preventative Visit  & Comprehensive Evaluation & Examination     This very nice 72 y.o.male presents for a Screening/Preventative Visit & comprehensive evaluation and management of multiple medical co-morbidities.  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 Patient has GERD controlled w/meds. He also has Gout controlled with his meds. He has OSA on CPAP reporting improved restorative sleep and less daytime hypersomnolence.      HTN predates circa 1985. Patient's BP has been controlled at home.  Today's BP is elevated at 158/96. Patient denies any cardiac symptoms as chest pain, palpitations, shortness of breath, dizziness or ankle swelling.     Patient's hyperlipidemia is controlled with diet and medications. Patient denies myalgias or other medication SE's. Last lipids were at goal albeit elevated Trig's: Lab Results  Component Value Date   CHOL 167 06/11/2017   HDL 27 (L) 06/11/2017   LDLCALC 64 03/05/2017   TRIG 248 (H) 06/11/2017   CHOLHDL 6.2 (H) 06/11/2017      Patient has Morbid Obesity (BMI 35+) and T2_NIDDM (1997) with Stage 2 CKD (GFR 67)  and patient denies reactive hypoglycemic symptoms, visual blurring, diabetic polys or paresthesias.  Patient's wife reports that he has uncontrolled compulsive overeating & over snacking and last A1c was not at goal: Lab Results  Component Value Date   HGBA1C 8.7 (H) 06/11/2017       Patient has been on Depo-Testosterone inj for low T and is current overdue for his shot.      Finally, patient has history of Vitamin D Deficiency ("37"/2008)  and last vitamin D was at goal:  Lab Results   Component Value Date   VD25OH 60 06/11/2017   Current Outpatient Medications on File Prior to Visit  Medication Sig  . atenolol (TENORMIN) 100 MG tablet TAKE 1 TABLET BY MOUTH DAILY  . B-D 3CC LUER-LOK SYR 21GX1" 21G X 1" 3 ML MISC use as directed  . Blood Glucose Monitoring Suppl (ONETOUCH VERIO IQ SYSTEM) W/DEVICE KIT Patient test blood sugar once daily  . cholecalciferol (VITAMIN D) 1000 units tablet Take 2,000 Units by mouth 2 (two) times daily.   Marland Kitchen glucose blood (ONETOUCH VERIO) test strip Patient to check blood sugar once daily  . glyBURIDE (DIABETA) 5 MG tablet TAKE 1/2 TO 1 TABLET BY MOUTH THREE TIMES DAILY AFTER MEALS  . hydrochlorothiazide (HYDRODIURIL) 25 MG tablet TAKE 1 TABLET BY MOUTH EVERY MORNING FOR BLOOD PRESSURE  . JANUMET 50-500 MG tablet take 1 tablet by mouth twice a day with food  . Lancets MISC Patient to check blood sugar once daily  . losartan (COZAAR) 100 MG tablet Take 1 tablet (100 mg total) by mouth daily.  . meloxicam (MOBIC) 15 MG tablet TAKE 1 TABLET BY MOUTH DAILY WITH FOOD  . phentermine (ADIPEX-P) 37.5 MG tablet take 1/2 to 1 tablet by mouth every morning FOR DIETING AND WEIGHT LOSS  . ranitidine (ZANTAC) 300 MG capsule TAKE 1 CAPSULE BY MOUTH ONCE DAILY  . testosterone cypionate (DEPOTESTOSTERONE CYPIONATE) 200 MG/ML injection INJECT 2 MILLILITERS INTRAMUSCULARLY EVERY  2 WEEKS AS DIRECTED  . gabapentin (NEURONTIN) 600 MG tablet Take 1/2 to 1 tablet 2 to 3 x / day for sciatica pain. (Patient not taking: Reported on 09/12/2017)  . Magnesium 250 MG TABS Take by mouth 2 (two) times daily.   No current facility-administered medications on file prior to visit.    Allergies  Allergen Reactions  . Ace Inhibitors   . Ibuprofen     GI upset  . Invokana [Canagliflozin]   . Metformin And Related     Diarrhea  . Quinidine    Past Medical History:  Diagnosis Date  . BPH (benign prostatic hypertrophy)   . Diabetes mellitus without complication ([place])   .  GERD (gastroesophageal reflux disease)   . Hypertension   . Hypogonadism male   . OSA (obstructive sleep apnea)   . Vitamin D deficiency    Health Maintenance  Topic Date Due  . COLONOSCOPY  07/25/2015  . FOOT EXAM  08/07/2017  . HEMOGLOBIN A1C  12/09/2017  . OPHTHALMOLOGY EXAM  04/16/2018  . TETANUS/TDAP  08/12/2022  . INFLUENZA VACCINE  Completed  . Hepatitis C Screening  Completed  . PNA vac Low Risk Adult  Completed   Immunization History  Administered Date(s) Administered  . Influenza Split 06/23/2013  . Influenza, High Dose Seasonal PF 06/29/2014, 05/24/2015, 04/24/2016, 06/11/2017  . Pneumococcal Conjugate-13 01/05/2014  . Pneumococcal-Unspecified 07/24/1994  . Td 06/23/2002  . Zoster 10/23/2010   Last Colon - 2007 - Dr Verdia Kuba and he's overdue 10 yr f/u for 2017 . He declined to do hemoccult.  Past Surgical History:  Procedure Laterality Date  . HERNIA REPAIR  6283   umbilical  . ROTATOR CUFF REPAIR Left 1996  . ROTATOR CUFF REPAIR Right 1997  . TONSILLECTOMY  1952   Family History  Problem Relation Age of Onset  . Cancer Mother        throat  . Diabetes Mother   . Thyroid disease Father   . Cancer Father        lung  . Thyroid disease Sister   . Hypertension Son   . Hyperlipidemia Son   . Cancer Paternal Grandmother        colon   Social History   Socioeconomic History  . Marital status: Married    Spouse name: [name]  . Number of children: 2 children , 4 GC  Occupational History  . Truck driver  Tobacco Use  . Smoking status: Never Smoker  . Smokeless tobacco: Never Used  Substance and Sexual Activity  . Alcohol use: Yes    Comment: very rarely  . Drug use: Not on file  . Sexual activity: Not on file    ROS Constitutional: Denies fever, chills, weight loss/gain, headaches, insomnia,  night sweats or change in appetite. Does c/o fatigue. Eyes: Denies redness, blurred vision, diplopia, discharge, itchy or watery eyes.  ENT: Denies  discharge, congestion, post nasal drip, epistaxis, sore throat, earache, hearing loss, dental pain, Tinnitus, Vertigo, Sinus pain or snoring.  Cardio: Denies chest pain, palpitations, irregular heartbeat, syncope, dyspnea, diaphoresis, orthopnea, PND, claudication or edema Respiratory: denies cough, dyspnea, DOE, pleurisy, hoarseness, laryngitis or wheezing.  Gastrointestinal: Denies dysphagia, heartburn, reflux, water brash, pain, cramps, nausea, vomiting, bloating, diarrhea, constipation, hematemesis, melena, hematochezia, jaundice or hemorrhoids Genitourinary: Denies dysuria, frequency, urgency, nocturia, hesitancy, discharge, hematuria or flank pain Musculoskeletal: Denies arthralgia, myalgia, stiffness, Jt. Swelling, pain, limp or strain/sprain. Denies Falls. Skin: Denies puritis, rash, hives, warts, acne, eczema or change  in skin lesion Neuro: No weakness, tremor, incoordination, spasms, paresthesia or pain Psychiatric: Denies confusion, memory loss or sensory loss. Denies Depression. Endocrine: Denies change in weight, skin, hair change, nocturia, and paresthesia, diabetic polys, visual blurring or hyper / hypo glycemic episodes.  Heme/Lymph: No excessive bleeding, bruising or enlarged lymph nodes.  Physical Exam  BP (!) 158/96   Pulse 76   Temp 97.7 F (36.5 C)   Resp 18   Ht 5' 8" (1.727 m)   Wt 233 lb 9.6 oz (106 kg)   BMI 35.52 kg/m   General Appearance: Over nourished and well groomed and in no apparent distress.  Eyes: PERRLA, EOMs, conjunctiva no swelling or erythema, normal fundi and vessels. Sinuses: No frontal/maxillary tenderness ENT/Mouth: EACs patent / TMs  nl. Nares clear without erythema, swelling, mucoid exudates. Oral hygiene is good. No erythema, swelling, or exudate. Tongue normal, non-obstructing. Tonsils not swollen or erythematous. Hearing normal.  Neck: Supple, thyroid not palpable. No bruits, nodes or JVD. Respiratory: Respiratory effort normal.  BS equal  and clear bilateral without rales, rhonci, wheezing or stridor. Cardio: Heart sounds are normal with regular rate and rhythm and no murmurs, rubs or gallops. Peripheral pulses are normal and equal bilaterally without edema. No aortic or femoral bruits. Chest: symmetric with normal excursions and percussion.  Abdomen: Soft, with Nl bowel sounds. Nontender, no guarding, rebound, hernias, masses, or organomegaly.  Lymphatics: Non tender without lymphadenopathy.  Genitourinary:  DRE - declined by patient.  Musculoskeletal: Full ROM all peripheral extremities, joint stability, 5/5 strength, and normal gait. Skin: Warm and dry without rashes, lesions, cyanosis, clubbing or  ecchymosis.  Neuro: Cranial nerves intact, reflexes equal bilaterally. Normal muscle tone, no cerebellar symptoms. Sensation intact to touch, vibratory and Monofilament to the toes bilaterally. Pysch: Alert and oriented X 3 with normal affect, insight and judgment appropriate.   Assessment and Plan  1. Annual Preventative/Screening Exam   2. Essential hypertension  - EKG 12-Lead - [place], RETROPERITNL ABD,  LTD - Urinalysis, Routine w reflex microscopic - Microalbumin / creatinine urine ratio - CBC with Differential/Platelet - BASIC METABOLIC PANEL WITH GFR - Magnesium - TSH  3. Hyperlipidemia, mixed  - EKG 12-Lead - [place], RETROPERITNL ABD,  LTD - Hepatic function panel - Lipid panel - TSH  4. Type 2 diabetes mellitus with stage 2 chronic kidney disease,  ([place])  - EKG 12-Lead - [place], RETROPERITNL ABD,  LTD - Urinalysis, Routine w reflex microscopic - Microalbumin / creatinine urine ratio - HM DIABETES FOOT EXAM - LOW EXTREMITY NEUR EXAM DOCUM - Hemoglobin A1c - Insulin, random  5. Vitamin D deficiency  - VITAMIN D 25 Hydroxy   6. Testosterone Deficiency  - Testosterone  7. Gluttony   8. Class 2 severe obesity due to excess calories with serious comorbidity and body mass index (BMI) of 35.0 to 35.9 in  adult ([place])   9. Gastroesophageal reflux disease  - CBC with Differential/Platelet  10. Acute idiopathic gout, unspecified site  - Uric acid  11. OSA (obstructive sleep apnea)   12. Prostate cancer screening  - PSA  13. Screening for colorectal cancer  - POC Hemoccult Bld/Stl   14. Screening for ischemic heart disease  - EKG 12-Lead  15. Aortic atherosclerosis (H[facility])  - [place], RETROPERITNL ABD,  LTD  16. Screening for AAA (aortic abdominal aneurysm)  - [place], RETROPERITNL ABD,  LTD  17. Fatigue, unspecified type  - Iron,Total/Total Iron Binding Cap - Vitamin B12 - Testosterone - CBC with  Differential/Platelet - TSH  18. Medication management  - Urinalysis, Routine w reflex microscopic - Microalbumin / creatinine urine ratio - CBC with Differential/Platelet - BASIC METABOLIC PANEL WITH GFR - Hepatic function panel - Magnesium - Lipid panel - TSH - Hemoglobin A1c - Insulin, random - VITAMIN D 25 Hydroxy          Patient was counseled in prudent diet, weight control to achieve/maintain BMI less than 25, BP monitoring, regular exercise and medications as discussed.  Discussed med effects and SE's. Routine screening labs and tests as requested with regular follow-up as recommended. Over 40 minutes of exam, counseling, chart review and high complex critical decision making was performed

## 2017-09-12 NOTE — Patient Instructions (Signed)

## 2017-09-13 ENCOUNTER — Other Ambulatory Visit: Payer: Self-pay | Admitting: Orthopedic Surgery

## 2017-09-13 DIAGNOSIS — M5416 Radiculopathy, lumbar region: Secondary | ICD-10-CM

## 2017-09-13 LAB — MICROALBUMIN / CREATININE URINE RATIO
Creatinine, Urine: 143 mg/dL (ref 20–320)
Microalb Creat Ratio: 20 mcg/mg creat (ref <30)
Microalb, Ur: 2.9 mg/dL

## 2017-09-13 LAB — BASIC METABOLIC PANEL WITH GFR
BUN/Creatinine Ratio: 16 (calc) (ref 6–22)
BUN: 19 mg/dL (ref 7–25)
CO2: 28 mmol/L (ref 20–32)
Calcium: 9.7 mg/dL (ref 8.6–10.3)
Chloride: 97 mmol/L — ABNORMAL LOW (ref 98–110)
Creat: 1.22 mg/dL — ABNORMAL HIGH (ref 0.70–1.18)
GFR, Est African American: 69 mL/min/{1.73_m2} (ref >=60)
GFR, Est Non African American: 60 mL/min/{1.73_m2} (ref >=60)
Glucose, Bld: 163 mg/dL — ABNORMAL HIGH (ref 65–99)
Potassium: 4.5 mmol/L (ref 3.5–5.3)
Sodium: 137 mmol/L (ref 135–146)

## 2017-09-13 LAB — CBC WITH DIFFERENTIAL/PLATELET
Basophils Absolute: 104 cells/uL (ref 0–200)
Basophils Relative: 1.3 %
Eosinophils Absolute: 272 cells/uL (ref 15–500)
Eosinophils Relative: 3.4 %
HCT: 45.7 % (ref 38.5–50.0)
Hemoglobin: 15.7 g/dL (ref 13.2–17.1)
Lymphs Abs: 2496 cells/uL (ref 850–3900)
MCH: 27.9 pg (ref 27.0–33.0)
MCHC: 34.4 g/dL (ref 32.0–36.0)
MCV: 81.2 fL (ref 80.0–100.0)
MPV: 9.1 fL (ref 7.5–12.5)
Monocytes Relative: 7.3 %
Neutro Abs: 4544 cells/uL (ref 1500–7800)
Neutrophils Relative %: 56.8 %
Platelets: 211 10*3/uL (ref 140–400)
RBC: 5.63 10*6/uL (ref 4.20–5.80)
RDW: 14.4 % (ref 11.0–15.0)
Total Lymphocyte: 31.2 %
WBC mixed population: 584 cells/uL (ref 200–950)
WBC: 8 10*3/uL (ref 3.8–10.8)

## 2017-09-13 LAB — TSH: TSH: 2.75 mIU/L (ref 0.40–4.50)

## 2017-09-13 LAB — INSULIN, RANDOM: Insulin: 11.3 u[IU]/mL (ref 2.0–19.6)

## 2017-09-13 LAB — HEPATIC FUNCTION PANEL
AG Ratio: 1.8 (calc) (ref 1.0–2.5)
ALT: 20 U/L (ref 9–46)
AST: 18 U/L (ref 10–35)
Albumin: 4.2 g/dL (ref 3.6–5.1)
Alkaline phosphatase (APISO): 62 U/L (ref 40–115)
Bilirubin, Direct: 0.2 mg/dL (ref 0.0–0.2)
Globulin: 2.3 g/dL (calc) (ref 1.9–3.7)
Indirect Bilirubin: 0.7 mg/dL (calc) (ref 0.2–1.2)
Total Bilirubin: 0.9 mg/dL (ref 0.2–1.2)
Total Protein: 6.5 g/dL (ref 6.1–8.1)

## 2017-09-13 LAB — VITAMIN B12: Vitamin B-12: 433 pg/mL (ref 200–1100)

## 2017-09-13 LAB — IRON, TOTAL/TOTAL IRON BINDING CAP
%SAT: 33 % (calc) (ref 15–60)
Iron: 113 ug/dL (ref 50–180)
TIBC: 339 mcg/dL (calc) (ref 250–425)

## 2017-09-13 LAB — HEMOGLOBIN A1C
Hgb A1c MFr Bld: 9.6 % of total Hgb — ABNORMAL HIGH (ref <5.7)
Mean Plasma Glucose: 229 (calc)
eAG (mmol/L): 12.7 (calc)

## 2017-09-13 LAB — LIPID PANEL
Cholesterol: 174 mg/dL (ref <200)
HDL: 38 mg/dL — ABNORMAL LOW (ref >40)
LDL Cholesterol (Calc): 98 mg/dL (calc)
Non-HDL Cholesterol (Calc): 136 mg/dL (calc) — ABNORMAL HIGH (ref <130)
Total CHOL/HDL Ratio: 4.6 (calc) (ref <5.0)
Triglycerides: 268 mg/dL — ABNORMAL HIGH (ref <150)

## 2017-09-13 LAB — TESTOSTERONE: Testosterone: 293 ng/dL (ref 250–827)

## 2017-09-13 LAB — URINALYSIS, ROUTINE W REFLEX MICROSCOPIC
Bilirubin Urine: NEGATIVE
Glucose, UA: NEGATIVE
Hgb urine dipstick: NEGATIVE
Ketones, ur: NEGATIVE
Leukocytes, UA: NEGATIVE
Nitrite: NEGATIVE
Protein, ur: NEGATIVE
Specific Gravity, Urine: 1.021 (ref 1.001–1.03)
pH: <=5 (ref 5.0–8.0)

## 2017-09-13 LAB — PSA: PSA: 0.5 ng/mL (ref <=4.0)

## 2017-09-13 LAB — MAGNESIUM: Magnesium: 1.4 mg/dL — ABNORMAL LOW (ref 1.5–2.5)

## 2017-09-13 LAB — VITAMIN D 25 HYDROXY (VIT D DEFICIENCY, FRACTURES): Vit D, 25-Hydroxy: 63 ng/mL (ref 30–100)

## 2017-09-13 LAB — URIC ACID: Uric Acid, Serum: 6.1 mg/dL (ref 4.0–8.0)

## 2017-09-22 ENCOUNTER — Other Ambulatory Visit: Payer: Managed Care, Other (non HMO)

## 2017-10-01 ENCOUNTER — Other Ambulatory Visit: Payer: Self-pay | Admitting: Internal Medicine

## 2017-10-15 ENCOUNTER — Telehealth: Payer: Self-pay | Admitting: Internal Medicine

## 2017-10-15 LAB — COLOGUARD: Cologuard: POSITIVE

## 2017-10-15 NOTE — Telephone Encounter (Signed)
Called patient, spoke w/ spouse, [name], Positive Cologuard result, referring patient to Dr Juanita Craver, for GI consult. Spouse understands result and referral. Mailing copy of [facility] to patient.

## 2017-10-16 ENCOUNTER — Encounter: Payer: Self-pay | Admitting: *Deleted

## 2017-10-21 ENCOUNTER — Other Ambulatory Visit: Payer: Self-pay | Admitting: Internal Medicine

## 2017-10-23 ENCOUNTER — Other Ambulatory Visit: Payer: Self-pay | Admitting: *Deleted

## 2017-10-23 MED ORDER — TAMSULOSIN HCL 0.4 MG PO CAPS: 0.4000 mg | ORAL_CAPSULE | Freq: Every day | ORAL | 0 refills | Status: DC

## 2017-10-25 ENCOUNTER — Other Ambulatory Visit: Payer: Self-pay | Admitting: Internal Medicine

## 2017-11-11 ENCOUNTER — Other Ambulatory Visit: Payer: Self-pay | Admitting: Internal Medicine

## 2017-11-12 ENCOUNTER — Other Ambulatory Visit: Payer: Self-pay | Admitting: Internal Medicine

## 2017-11-19 ENCOUNTER — Other Ambulatory Visit: Payer: Self-pay

## 2017-11-19 DIAGNOSIS — N182 Chronic kidney disease, stage 2 (mild): Secondary | ICD-10-CM

## 2017-11-19 DIAGNOSIS — E1122 Type 2 diabetes mellitus with diabetic chronic kidney disease: Secondary | ICD-10-CM

## 2017-11-19 MED ORDER — SITAGLIPTIN PHOS-METFORMIN HCL 50-500 MG PO TABS: 1.0000 | ORAL_TABLET | Freq: Two times a day (BID) | ORAL | 1 refills | Status: DC

## 2017-12-13 ENCOUNTER — Other Ambulatory Visit: Payer: Self-pay | Admitting: Adult Health

## 2017-12-24 ENCOUNTER — Ambulatory Visit: Payer: Self-pay | Admitting: Adult Health

## 2017-12-31 LAB — HM COLONOSCOPY

## 2018-01-10 ENCOUNTER — Encounter: Payer: Self-pay | Admitting: *Deleted

## 2018-01-17 ENCOUNTER — Other Ambulatory Visit: Payer: Self-pay | Admitting: Internal Medicine

## 2018-01-17 ENCOUNTER — Other Ambulatory Visit: Payer: Self-pay | Admitting: Adult Health

## 2018-01-18 NOTE — Progress Notes (Signed)
FOLLOW UP  Assessment and Plan:   Hypertension Currently at goal  Monitor blood pressure at home; patient to call if consistently greater than 130/80 Continue DASH diet.   Reminder to go to the ER if any CP, SOB, nausea, dizziness, severe HA, changes vision/speech, left arm numbness and tingling and jaw pain.  Cholesterol Not currently on treatment; has been on previously and had joint aches; discussed low dose statin Encouraged low cholesterol diet and exercise.  Check lipid panel.   Diabetes with diabetic chronic kidney disease Continue medications - janumet, glipizide- discussed increase to TID Continue diet and exercise.  Perform daily foot/skin check, notify office of any concerning changes.  Check A1C  Stage 3 CKD Increase fluids, avoid NSAIDS, monitor sugars, will monitor  Hypogonadism On zinc 50 mg daily; no longer on testosterone due to polycythemia  Obesity with co morbidities Long discussion about weight loss, diet, and exercise He is prescribed phentermine; taking with routine breaks and is losing weight Discussed ideal weight for height and weight goal for next follow up (230 lb) Patient will work on continuing with dietary changes - whole grains, increased vegetables, etc Has been off phentermine, will restart Will follow up in 3 months  Vitamin D Def Continue supplementation for goal 70-100 Defer Vit D level  Continue diet and meds as discussed. Further disposition pending results of labs. Discussed med's effects and SE's.   Over 30 minutes of exam, counseling, chart review, and critical decision making was performed.   Future Appointments  Date Time Provider [place]  04/15/2018  9:30 AM Unk Pinto, MD [facility] None  10/14/2018 10:00 AM Unk Pinto, MD [facility] None    ----------------------------------------------------------------------------------------------------------------------  HPI 72 y.o. male  presents for 3 month  follow up on hypertension, cholesterol, diabetes, obesity, hypogonadism and vitamin D deficiency. He is scheduled to see Dr. Mathis Dad today for possible torn ligament of right thumb.   BMI is Body mass index is 36.01 kg/m., he is working on diet and exercise, walking regularly while at work. He reports he got down 219 lb on home scales but went on a cruise 3 weeks ago and has gained some back.  Wt Readings from Last 3 Encounters:  01/21/18 236 lb 12.8 oz (107.4 kg)  09/12/17 233 lb 9.6 oz (106 kg)  07/31/17 231 lb 3.2 oz (104.9 kg)   He does not check his BP at home, today their BP is BP: 128/86  He does not workout. He denies chest pain, shortness of breath, dizziness.   He is not on cholesterol medication and denies myalgias. His cholesterol is not at goal. The cholesterol last visit was:   Lab Results  Component Value Date   CHOL 174 09/12/2017   HDL 38 (L) 09/12/2017   LDLCALC 98 09/12/2017   TRIG 268 (H) 09/12/2017   CHOLHDL 4.6 09/12/2017    He has been working on diet somewhat, not exercising at all - for diabetes (on janumet 50-500 BID - cannot tolerate higher dose of metformin, glyburide 5 mg, has only been taking BID), and denies hyperglycemia, hypoglycemia , increased appetite, nausea, paresthesia of the feet, polydipsia, polyuria, visual disturbances and vomiting. He has check fasting sporadically ranged 90-140. Last A1C in the office was:  Lab Results  Component Value Date   HGBA1C 9.6 (H) 09/12/2017   Patient is on Vitamin D supplement and near goal of 70:  Lab Results  Component Value Date   VD25OH 63 09/12/2017  Current Medications:  Current Outpatient Medications on File Prior to Visit  Medication Sig  . atenolol (TENORMIN) 100 MG tablet TAKE 1 TABLET BY MOUTH DAILY  . B-D 3CC LUER-LOK SYR 21GX1" 21G X 1" 3 ML MISC use as directed  . Blood Glucose Monitoring Suppl (ONETOUCH VERIO IQ SYSTEM) W/DEVICE KIT Patient test blood sugar once daily  . cholecalciferol  (VITAMIN D) 1000 units tablet Take 2,000 Units by mouth 2 (two) times daily.   Marland Kitchen gabapentin (NEURONTIN) 600 MG tablet Take 1/2 to 1 tablet 2 to 3 x / day for sciatica pain.  Marland Kitchen glucose blood (ONETOUCH VERIO) test strip Patient to check blood sugar once daily  . glyBURIDE (DIABETA) 5 MG tablet TAKE 1/2 TO 1 TABLET BY MOUTH THREE TIMES DAILY AFTER MEALS  . hydrochlorothiazide (HYDRODIURIL) 25 MG tablet TAKE 1 TABLET BY MOUTH EVERY MORNING FOR BLOOD PRESSURE  . Lancets MISC Patient to check blood sugar once daily  . losartan (COZAAR) 100 MG tablet TAKE 1 TABLET(100 MG) BY MOUTH DAILY  . Magnesium 250 MG TABS Take by mouth 2 (two) times daily.  . meloxicam (MOBIC) 15 MG tablet TAKE 1 TABLET BY MOUTH DAILY WITH FOOD  . phentermine (ADIPEX-P) 37.5 MG tablet take 1/2 to 1 tablet by mouth every morning FOR DIETING AND WEIGHT LOSS  . ranitidine (ZANTAC) 300 MG capsule TAKE 1 CAPSULE BY MOUTH EVERY DAY  . sitaGLIPtin-metformin (JANUMET) 50-500 MG tablet Take 1 tablet by mouth 2 (two) times daily with a meal.  . tamsulosin (FLOMAX) 0.4 MG CAPS capsule Take 1 capsule (0.4 mg total) by mouth daily.  Marland Kitchen testosterone cypionate (DEPOTESTOSTERONE CYPIONATE) 200 MG/ML injection INJECT 2 MILLILITERS INTRAMUSCULARLY EVERY 2 WEEKS AS DIRECTED   No current facility-administered medications on file prior to visit.      Allergies:  Allergies  Allergen Reactions  . Ace Inhibitors   . Ibuprofen     GI upset  . Invokana [Canagliflozin]   . Metformin And Related     Diarrhea  . Quinidine      Medical History:  Past Medical History:  Diagnosis Date  . BPH (benign prostatic hypertrophy)   . Diabetes mellitus without complication ([place])   . GERD (gastroesophageal reflux disease)   . Hypertension   . Hypogonadism male   . OSA (obstructive sleep apnea)   . Vitamin D deficiency    Family history- Reviewed and unchanged Social history- Reviewed and unchanged   Review of Systems:  Review of Systems   Constitutional: Negative for malaise/fatigue and weight loss.  HENT: Negative for hearing loss and tinnitus.   Eyes: Negative for blurred vision and double vision.  Respiratory: Negative for cough, shortness of breath and wheezing.   Cardiovascular: Negative for chest pain, palpitations, orthopnea, claudication and leg swelling.  Gastrointestinal: Negative for abdominal pain, blood in stool, constipation, diarrhea, heartburn, melena, nausea and vomiting.  Genitourinary: Negative.   Musculoskeletal: Negative for joint pain and myalgias.  Skin: Negative for rash.  Neurological: Negative for dizziness, tingling, sensory change, weakness and headaches.  Endo/Heme/Allergies: Negative for polydipsia.  Psychiatric/Behavioral: Negative.   All other systems reviewed and are negative.     Physical Exam: BP 128/86   Pulse 75   Temp (!) 97.3 F (36.3 C)   Resp 18   Ht _0  (1.727 m)   Wt 236 lb 12.8 oz (107.4 kg)   SpO2 98%   BMI 36.01 kg/m  Wt Readings from Last 3 Encounters:  01/21/18 236 lb 12.8 oz (  107.4 kg)  09/12/17 233 lb 9.6 oz (106 kg)  07/31/17 231 lb 3.2 oz (104.9 kg)   General Appearance: Well nourished, in no apparent distress. Eyes: PERRLA, EOMs, conjunctiva no swelling or erythema Sinuses: No Frontal/maxillary tenderness ENT/Mouth: Ext aud canals clear, TMs without erythema, bulging. No erythema, swelling, or exudate on post pharynx.  Tonsils not swollen or erythematous. Hearing normal.  Neck: Supple, thyroid normal.  Respiratory: Respiratory effort normal, BS equal bilaterally without rales, rhonchi, wheezing or stridor.  Cardio: RRR with no MRGs. Brisk peripheral pulses without edema.  Abdomen: Soft, + BS.  Non tender, no guarding, rebound, hernias, masses. Lymphatics: Non tender without lymphadenopathy.  Musculoskeletal: Full ROM (excepting R thumb in brace), 5/5 strength, Normal gait Skin: Warm, dry without rashes, lesions, ecchymosis.  Neuro: Cranial nerves  intact. No cerebellar symptoms.  Psych: Awake and oriented X 3, normal affect, Insight and Judgment appropriate.    Izora Ribas, NP 9:39 AM [facility]

## 2018-01-21 ENCOUNTER — Encounter: Payer: Self-pay | Admitting: Adult Health

## 2018-01-21 ENCOUNTER — Ambulatory Visit: Payer: Managed Care, Other (non HMO) | Admitting: Adult Health

## 2018-01-21 VITALS — BP 128/86 | HR 75 | Temp 97.3°F | Resp 18 | Ht 68.0 in | Wt 236.8 lb

## 2018-01-21 DIAGNOSIS — N183 Chronic kidney disease, stage 3 unspecified: Secondary | ICD-10-CM

## 2018-01-21 DIAGNOSIS — N182 Chronic kidney disease, stage 2 (mild): Secondary | ICD-10-CM | POA: Diagnosis not present

## 2018-01-21 DIAGNOSIS — E1122 Type 2 diabetes mellitus with diabetic chronic kidney disease: Secondary | ICD-10-CM | POA: Diagnosis not present

## 2018-01-21 DIAGNOSIS — Z6841 Body Mass Index (BMI) 40.0 and over, adult: Secondary | ICD-10-CM

## 2018-01-21 DIAGNOSIS — D751 Secondary polycythemia: Secondary | ICD-10-CM | POA: Diagnosis not present

## 2018-01-21 DIAGNOSIS — E559 Vitamin D deficiency, unspecified: Secondary | ICD-10-CM

## 2018-01-21 DIAGNOSIS — I10 Essential (primary) hypertension: Secondary | ICD-10-CM | POA: Diagnosis not present

## 2018-01-21 DIAGNOSIS — E291 Testicular hypofunction: Secondary | ICD-10-CM

## 2018-01-21 DIAGNOSIS — Z79899 Other long term (current) drug therapy: Secondary | ICD-10-CM | POA: Diagnosis not present

## 2018-01-21 DIAGNOSIS — E782 Mixed hyperlipidemia: Secondary | ICD-10-CM | POA: Diagnosis not present

## 2018-01-21 NOTE — Patient Instructions (Signed)
  Goals:   A1C (<7), blood pressure (<130/80), and cholesterol (LDL <70) Eye Exam yearly and Dental Exam every 6 months.  TRY TO ADD ON WALKING, START LOW AT 20 MINS 2 DAYS A WEEK   Aim for 7+ servings of fruits and vegetables daily  80+ fluid ounces of water or unsweet tea for healthy kidneys  Limit alcohol intake  Limit animal fats in diet for cholesterol and heart health - choose grass fed whenever available  Aim for low stress - take time to unwind and care for your mental health  Aim for 150 min of moderate intensity exercise weekly for heart health, and weights twice weekly for bone health  Aim for 7-9 hours of sleep daily      When it comes to diets, agreement about the perfect plan isn't easy to find, even among the experts. Experts at the [place] developed an idea known as the Healthy Eating Plate. Just imagine a plate divided into logical, healthy portions.  The emphasis is on diet quality:  Load up on vegetables and fruits - one-half of your plate: Aim for color and variety, and remember that potatoes don't count.  Go for whole grains - one-quarter of your plate: Whole wheat, barley, wheat berries, quinoa, oats, brown rice, and foods made with them. If you want pasta, go with whole wheat pasta.  Protein power - one-quarter of your plate: Fish, chicken, beans, and nuts are all healthy, versatile protein sources. Limit red meat.  The diet, however, does go beyond the plate, offering a few other suggestions.  Use healthy plant oils, such as olive, canola, soy, corn, sunflower and peanut. Check the labels, and avoid partially hydrogenated oil, which have unhealthy trans fats.  If you're thirsty, drink water. Coffee and tea are good in moderation, but skip sugary drinks and limit milk and dairy products to one or two daily servings.  The type of carbohydrate in the diet is more important than the amount. Some sources of carbohydrates, such as  vegetables, fruits, whole grains, and beans-are healthier than others.  Finally, stay active.

## 2018-01-22 LAB — COMPLETE METABOLIC PANEL WITH GFR
AG Ratio: 1.8 (calc) (ref 1.0–2.5)
ALT: 17 U/L (ref 9–46)
AST: 17 U/L (ref 10–35)
Albumin: 4.3 g/dL (ref 3.6–5.1)
Alkaline phosphatase (APISO): 65 U/L (ref 40–115)
BUN: 20 mg/dL (ref 7–25)
CO2: 25 mmol/L (ref 20–32)
Calcium: 9.8 mg/dL (ref 8.6–10.3)
Chloride: 100 mmol/L (ref 98–110)
Creat: 1.08 mg/dL (ref 0.70–1.18)
GFR, Est African American: 80 mL/min/{1.73_m2} (ref >=60)
GFR, Est Non African American: 69 mL/min/{1.73_m2} (ref >=60)
Globulin: 2.4 g/dL (calc) (ref 1.9–3.7)
Glucose, Bld: 184 mg/dL — ABNORMAL HIGH (ref 65–99)
Potassium: 4.3 mmol/L (ref 3.5–5.3)
Sodium: 135 mmol/L (ref 135–146)
Total Bilirubin: 0.4 mg/dL (ref 0.2–1.2)
Total Protein: 6.7 g/dL (ref 6.1–8.1)

## 2018-01-22 LAB — LIPID PANEL
Cholesterol: 168 mg/dL (ref <200)
HDL: 33 mg/dL — ABNORMAL LOW (ref >40)
LDL Cholesterol (Calc): 83 mg/dL (calc)
Non-HDL Cholesterol (Calc): 135 mg/dL (calc) — ABNORMAL HIGH (ref <130)
Total CHOL/HDL Ratio: 5.1 (calc) — ABNORMAL HIGH (ref <5.0)
Triglycerides: 392 mg/dL — ABNORMAL HIGH (ref <150)

## 2018-01-22 LAB — CBC WITH DIFFERENTIAL/PLATELET
Basophils Absolute: 62 cells/uL (ref 0–200)
Basophils Relative: 0.9 %
Eosinophils Absolute: 200 cells/uL (ref 15–500)
Eosinophils Relative: 2.9 %
HCT: 44.4 % (ref 38.5–50.0)
Hemoglobin: 15.1 g/dL (ref 13.2–17.1)
Lymphs Abs: 1898 cells/uL (ref 850–3900)
MCH: 28.2 pg (ref 27.0–33.0)
MCHC: 34 g/dL (ref 32.0–36.0)
MCV: 83 fL (ref 80.0–100.0)
MPV: 9.6 fL (ref 7.5–12.5)
Monocytes Relative: 8.1 %
Neutro Abs: 4181 cells/uL (ref 1500–7800)
Neutrophils Relative %: 60.6 %
Platelets: 188 10*3/uL (ref 140–400)
RBC: 5.35 10*6/uL (ref 4.20–5.80)
RDW: 13.1 % (ref 11.0–15.0)
Total Lymphocyte: 27.5 %
WBC mixed population: 559 cells/uL (ref 200–950)
WBC: 6.9 10*3/uL (ref 3.8–10.8)

## 2018-01-22 LAB — HEMOGLOBIN A1C
Hgb A1c MFr Bld: 7.5 % of total Hgb — ABNORMAL HIGH (ref <5.7)
Mean Plasma Glucose: 169 (calc)
eAG (mmol/L): 9.3 (calc)

## 2018-01-22 LAB — MAGNESIUM: Magnesium: 1.7 mg/dL (ref 1.5–2.5)

## 2018-01-22 LAB — TSH: TSH: 2.04 mIU/L (ref 0.40–4.50)

## 2018-02-22 ENCOUNTER — Other Ambulatory Visit: Payer: Self-pay | Admitting: Orthopedic Surgery

## 2018-02-28 ENCOUNTER — Other Ambulatory Visit: Payer: Self-pay | Admitting: Internal Medicine

## 2018-03-27 ENCOUNTER — Encounter (HOSPITAL_COMMUNITY): Payer: Self-pay | Admitting: *Deleted

## 2018-03-27 ENCOUNTER — Other Ambulatory Visit: Payer: Self-pay

## 2018-03-27 NOTE — H&P (Signed)
Patient's anticipated LOS is less than 2 midnights, meeting these requirements: - Younger than 75 - Lives within 1 hour of care - Has a competent adult at home to recover with post-op recover - NO history of  - Chronic pain requiring opiods  - Diabetes  - Coronary Artery Disease  - Heart failure  - Heart attack  - Stroke  - DVT/VTE  - Cardiac arrhythmia  - Respiratory Failure/COPD  - Renal failure  - Anemia  - Advanced Liver disease       Phillip Powell is an 72 y.o. male.    Chief Complaint: left shoulder pain/mass  HPI: Pt is a 72 y.o. male complaining of left shoulder pain for multiple years. Pain had continually increased since the beginning. Exam shows mass at left [facility] joint. Pt has tried various conservative treatments which have failed to alleviate their symptoms. Various options are discussed with the patient. Risks, benefits and expectations were discussed with the patient. Patient understand the risks, benefits and expectations and wishes to proceed with surgery.   PCP:  Unk Pinto, MD  D/C Plans: Home  PMH: Past Medical History:  Diagnosis Date  . BPH (benign prostatic hypertrophy)   . Diabetes mellitus without complication ([place])   . GERD (gastroesophageal reflux disease)   . Hypertension   . Hypogonadism male   . OSA (obstructive sleep apnea)   . Vitamin D deficiency     PSH: Past Surgical History:  Procedure Laterality Date  . HERNIA REPAIR  0626   umbilical  . ROTATOR CUFF REPAIR Left 1996  . ROTATOR CUFF REPAIR Right 1997  . TONSILLECTOMY  1952    Social History:  reports that he has never smoked. He has never used smokeless tobacco. He reports that he drinks alcohol. His drug history is not on file.  Allergies:  Allergies  Allergen Reactions  . Ace Inhibitors Other (See Comments)    Unknown  . Ibuprofen Other (See Comments)    GI upset  . Invokana [Canagliflozin] Other (See Comments)    Unknown  . Metformin And Related Diarrhea   . Quinidine Other (See Comments)    Unknown    Medications: No current facility-administered medications for this encounter.    Current Outpatient Medications  Medication Sig Dispense Refill  . aspirin EC 81 MG tablet Take 81 mg by mouth daily.    Marland Kitchen atenolol (TENORMIN) 100 MG tablet TAKE 1 TABLET BY MOUTH DAILY (Patient taking differently: Take 100 mg by mouth daily. ) 90 tablet 0  . glyBURIDE (DIABETA) 5 MG tablet TAKE 1/2 TO 1 TABLET BY MOUTH THREE TIMES DAILY AFTER MEALS (Patient taking differently: Take 5 mg by mouth 3 (three) times daily after meals. ) 270 tablet 1  . hydrochlorothiazide (HYDRODIURIL) 25 MG tablet TAKE 1 TABLET BY MOUTH EVERY MORNING FOR BLOOD PRESSURE (Patient taking differently: Take 25 mg by mouth daily. ) 90 tablet 1  . losartan (COZAAR) 100 MG tablet TAKE 1 TABLET(100 MG) BY MOUTH DAILY (Patient taking differently: Take 100 mg by mouth daily. ) 90 tablet 3  . meclizine (ANTIVERT) 25 MG tablet Take 25 mg by mouth 3 (three) times daily as needed for dizziness.    . meloxicam (MOBIC) 15 MG tablet TAKE 1 TABLET BY MOUTH DAILY WITH FOOD (Patient taking differently: Take 15 mg by mouth daily. ) 90 tablet 0  . ranitidine (ZANTAC) 300 MG capsule TAKE 1 CAPSULE BY MOUTH EVERY DAY (Patient taking differently: Take 300 mg by mouth daily. )  90 capsule 1  . sitaGLIPtin-metformin (JANUMET) 50-500 MG tablet Take 1 tablet by mouth 2 (two) times daily with a meal. 180 tablet 1  . tamsulosin (FLOMAX) 0.4 MG CAPS capsule TAKE 1 CAPSULE(0.4 MG) BY MOUTH DAILY (Patient taking differently: Take 0.4 mg by mouth daily. ) 90 capsule 0  . B-D 3CC LUER-LOK SYR 21GX1" 21G X 1" 3 ML MISC use as directed (Patient not taking: Reported on 03/26/2018) 12 each 1  . Blood Glucose Monitoring Suppl (ONETOUCH VERIO IQ SYSTEM) W/DEVICE KIT Patient test blood sugar once daily (Patient not taking: Reported on 03/26/2018) 1 kit 0  . Cholecalciferol (VITAMIN D3) 2000 units TABS Take 4,000 Units by mouth 2 (two)  times daily.    . Cinnamon 500 MG capsule Take 2,000 mg by mouth 2 (two) times daily.    Marland Kitchen gabapentin (NEURONTIN) 600 MG tablet Take 1/2 to 1 tablet 2 to 3 x / day for sciatica pain. (Patient not taking: Reported on 03/26/2018) 90 tablet 1  . glucose blood (ONETOUCH VERIO) test strip Patient to check blood sugar once daily (Patient not taking: Reported on 03/26/2018) 100 each 12  . Lancets MISC Patient to check blood sugar once daily (Patient not taking: Reported on 03/26/2018) 100 each 12  . Magnesium 500 MG TABS Take 500 mg by mouth daily.    Marland Kitchen pyridOXINE (VITAMIN B-6) 100 MG tablet Take 100 mg by mouth daily.    . TURMERIC PO Take 1 capsule by mouth daily.    . vitamin C (ASCORBIC ACID) 500 MG tablet Take 500 mg by mouth 2 (two) times daily.    Marland Kitchen zinc gluconate 50 MG tablet Take 50 mg by mouth daily.      No results found for this or any previous visit (from the past 48 hour(s)). No results found.  ROS: Pain with rom of the left upper extremity  Physical Exam: Alert and oriented 72 y.o. male in no acute distress Cranial nerves 2-12 intact Cervical spine: full rom with no tenderness, nv intact distally Chest: active breath sounds bilaterally, no wheeze rhonchi or rales Heart: regular rate and rhythm, no murmur Abd: non tender non distended with active bowel sounds Hip is stable with rom  Left shoulder with palpable mass to left AC joint nv intact distally Mild pain with rom  Assessment/Plan Assessment: left shoulder mass at [facility] joint  Plan:  Patient will undergo a left shoulder open distal clavicle excision by Dr. Veverly Fells at [facility]. Risks benefits and expectations were discussed with the patient. Patient understand risks, benefits and expectations and wishes to proceed. Preoperative templating of the joint replacement has been completed, documented, and submitted to the Operating Room personnel in order to optimize intra-operative equipment management.   Merla Riches PA-C,  MPAS [facility] is now [employer] [address] [place], [place], [place] [address] Phone: [phone] [email or web address] Facebook  [employer]

## 2018-03-27 NOTE — Progress Notes (Signed)
Pt denies SOB, chest pain and being under the care of a cardiologist. Pt spouse, [name] completed SDW-Pre-op call. Spouse denies that pt had an echo, stress and cardiac cath. Spouse denies that pt had a chest x ray within te last year. Spouse stated that pt stopped Mobic and Aspirin on Monday as instructed by surgeon. Spouse also made aware for pt to stop taking vitamins, fish oil herbal medications. Do not take any NSAIDs ie: Ibuprofen, Advil, Naproxen (Aleve), Motrin, BC and Goody Powder. Spouse stated that pt does not check his blood glucose (BG). Spouse made aware to have pt hold Diabeta and Janumet on DOS. Spouse verbalized understanding of all pre-op instructions.

## 2018-03-27 NOTE — Progress Notes (Signed)
Pt spouse, [name], made aware to have pt stop taking cinnamon and Turmeric. Spouse verbalized understanding of all pre-op instructions.

## 2018-03-29 ENCOUNTER — Encounter (HOSPITAL_COMMUNITY): Payer: Self-pay | Admitting: Anesthesiology

## 2018-03-29 ENCOUNTER — Ambulatory Visit (HOSPITAL_COMMUNITY): Payer: Managed Care, Other (non HMO) | Admitting: Anesthesiology

## 2018-03-29 ENCOUNTER — Ambulatory Visit (HOSPITAL_COMMUNITY)
Admission: RE | Admit: 2018-03-29 | Discharge: 2018-03-29 | Disposition: A | Discharge status: Home / Self Care | Payer: Managed Care, Other (non HMO) | Source: Ambulatory Visit | Attending: Orthopedic Surgery | Admitting: Orthopedic Surgery

## 2018-03-29 ENCOUNTER — Encounter (HOSPITAL_COMMUNITY)
Admission: RE | Disposition: A | Discharge status: Home / Self Care | Payer: Self-pay | Source: Ambulatory Visit | Attending: Orthopedic Surgery

## 2018-03-29 DIAGNOSIS — Z7984 Long term (current) use of oral hypoglycemic drugs: Secondary | ICD-10-CM | POA: Insufficient documentation

## 2018-03-29 DIAGNOSIS — Z888 Allergy status to other drugs, medicaments and biological substances status: Secondary | ICD-10-CM | POA: Insufficient documentation

## 2018-03-29 DIAGNOSIS — Z886 Allergy status to analgesic agent status: Secondary | ICD-10-CM | POA: Diagnosis not present

## 2018-03-29 DIAGNOSIS — M19012 Primary osteoarthritis, left shoulder: Secondary | ICD-10-CM | POA: Diagnosis not present

## 2018-03-29 DIAGNOSIS — I10 Essential (primary) hypertension: Secondary | ICD-10-CM | POA: Diagnosis not present

## 2018-03-29 DIAGNOSIS — K219 Gastro-esophageal reflux disease without esophagitis: Secondary | ICD-10-CM | POA: Insufficient documentation

## 2018-03-29 DIAGNOSIS — E669 Obesity, unspecified: Secondary | ICD-10-CM | POA: Insufficient documentation

## 2018-03-29 DIAGNOSIS — M67412 Ganglion, left shoulder: Secondary | ICD-10-CM | POA: Diagnosis not present

## 2018-03-29 DIAGNOSIS — Z7982 Long term (current) use of aspirin: Secondary | ICD-10-CM | POA: Diagnosis not present

## 2018-03-29 DIAGNOSIS — Z6833 Body mass index (BMI) 33.0-33.9, adult: Secondary | ICD-10-CM | POA: Diagnosis not present

## 2018-03-29 DIAGNOSIS — E291 Testicular hypofunction: Secondary | ICD-10-CM | POA: Diagnosis not present

## 2018-03-29 DIAGNOSIS — G4733 Obstructive sleep apnea (adult) (pediatric): Secondary | ICD-10-CM | POA: Insufficient documentation

## 2018-03-29 DIAGNOSIS — E119 Type 2 diabetes mellitus without complications: Secondary | ICD-10-CM | POA: Diagnosis not present

## 2018-03-29 DIAGNOSIS — Z9989 Dependence on other enabling machines and devices: Secondary | ICD-10-CM | POA: Diagnosis not present

## 2018-03-29 DIAGNOSIS — Z79899 Other long term (current) drug therapy: Secondary | ICD-10-CM | POA: Insufficient documentation

## 2018-03-29 DIAGNOSIS — N400 Enlarged prostate without lower urinary tract symptoms: Secondary | ICD-10-CM | POA: Insufficient documentation

## 2018-03-29 HISTORY — DX: Nausea with vomiting, unspecified: R11.2

## 2018-03-29 HISTORY — PX: ORIF CLAVICULAR FRACTURE: SHX5055

## 2018-03-29 HISTORY — DX: Localized swelling, mass and lump, unspecified: R22.9

## 2018-03-29 HISTORY — DX: Other specified postprocedural states: Z98.890

## 2018-03-29 HISTORY — DX: Reserved for concepts with insufficient information to code with codable children: IMO0002

## 2018-03-29 HISTORY — DX: Headache: R51

## 2018-03-29 HISTORY — DX: Headache, unspecified: R51.9

## 2018-03-29 HISTORY — DX: Sarcoidosis, unspecified: D86.9

## 2018-03-29 LAB — CBC
HCT: 40.3 % (ref 39.0–52.0)
Hemoglobin: 13.6 g/dL (ref 13.0–17.0)
MCH: 27.6 pg (ref 26.0–34.0)
MCHC: 33.7 g/dL (ref 30.0–36.0)
MCV: 81.9 fL (ref 78.0–100.0)
Platelets: 166 10*3/uL (ref 150–400)
RBC: 4.92 MIL/uL (ref 4.22–5.81)
RDW: 13.8 % (ref 11.5–15.5)
WBC: 7 10*3/uL (ref 4.0–10.5)

## 2018-03-29 LAB — BASIC METABOLIC PANEL
Anion gap: 11 (ref 5–15)
BUN: 16 mg/dL (ref 8–23)
CO2: 25 mmol/L (ref 22–32)
Calcium: 9.3 mg/dL (ref 8.9–10.3)
Chloride: 101 mmol/L (ref 98–111)
Creatinine, Ser: 0.99 mg/dL (ref 0.61–1.24)
GFR calc Af Amer: >60 mL/min (ref >60)
GFR calc non Af Amer: >60 mL/min (ref >60)
Glucose, Bld: 225 mg/dL — ABNORMAL HIGH (ref 70–99)
Potassium: 3.7 mmol/L (ref 3.5–5.1)
Sodium: 137 mmol/L (ref 135–145)

## 2018-03-29 LAB — GLUCOSE, CAPILLARY
Glucose-Capillary: 214 mg/dL — ABNORMAL HIGH (ref 70–99)
Glucose-Capillary: 199 mg/dL — ABNORMAL HIGH (ref 70–99)
Glucose-Capillary: 191 mg/dL — ABNORMAL HIGH (ref 70–99)

## 2018-03-29 SURGERY — OPEN REDUCTION INTERNAL FIXATION (ORIF) CLAVICULAR FRACTURE
Anesthesia: General | Site: Shoulder | Laterality: Left

## 2018-03-29 MED ORDER — LIDOCAINE 2% (20 MG/ML) 5 ML SYRINGE
INTRAMUSCULAR | Status: AC
  Filled 2018-03-29: qty 5

## 2018-03-29 MED ORDER — FENTANYL CITRATE (PF) 100 MCG/2ML IJ SOLN
INTRAMUSCULAR | Status: DC | PRN
  Administered 2018-03-29: 50 ug via INTRAVENOUS
  Administered 2018-03-29: 100 ug via INTRAVENOUS

## 2018-03-29 MED ORDER — PROPOFOL 10 MG/ML IV BOLUS
INTRAVENOUS | Status: DC | PRN
  Administered 2018-03-29: 130 mg via INTRAVENOUS
  Administered 2018-03-29: 30 mg via INTRAVENOUS

## 2018-03-29 MED ORDER — KETOROLAC TROMETHAMINE 30 MG/ML IJ SOLN
INTRAMUSCULAR | Status: AC
  Filled 2018-03-29: qty 1

## 2018-03-29 MED ORDER — CHLORHEXIDINE GLUCONATE 4 % EX LIQD: 60.0000 mL | Freq: Once | CUTANEOUS | Status: DC

## 2018-03-29 MED ORDER — CEFAZOLIN SODIUM-DEXTROSE 2-4 GM/100ML-% IV SOLN
INTRAVENOUS | Status: AC
  Filled 2018-03-29: qty 100

## 2018-03-29 MED ORDER — BUPIVACAINE-EPINEPHRINE 0.25% -1:200000 IJ SOLN
INTRAMUSCULAR | Status: DC | PRN
  Administered 2018-03-29: 3 mL

## 2018-03-29 MED ORDER — LIDOCAINE 2% (20 MG/ML) 5 ML SYRINGE
INTRAMUSCULAR | Status: DC | PRN
  Administered 2018-03-29: 60 mg via INTRAVENOUS

## 2018-03-29 MED ORDER — ACETAMINOPHEN 160 MG/5ML PO SOLN: 960.0000 mg | Freq: Once | ORAL | Status: AC

## 2018-03-29 MED ORDER — ONDANSETRON HCL 4 MG/2ML IJ SOLN
INTRAMUSCULAR | Status: DC | PRN
  Administered 2018-03-29: 4 mg via INTRAVENOUS

## 2018-03-29 MED ORDER — SUCCINYLCHOLINE CHLORIDE 20 MG/ML IJ SOLN
INTRAMUSCULAR | Status: DC | PRN
  Administered 2018-03-29: 100 mg via INTRAVENOUS

## 2018-03-29 MED ORDER — SODIUM CHLORIDE 0.9 % IV SOLN
INTRAVENOUS | Status: DC | PRN
  Administered 2018-03-29: 25 ug/min via INTRAVENOUS

## 2018-03-29 MED ORDER — FENTANYL CITRATE (PF) 250 MCG/5ML IJ SOLN
INTRAMUSCULAR | Status: AC
  Filled 2018-03-29: qty 5

## 2018-03-29 MED ORDER — DEXAMETHASONE SODIUM PHOSPHATE 10 MG/ML IJ SOLN
INTRAMUSCULAR | Status: AC
  Filled 2018-03-29: qty 1

## 2018-03-29 MED ORDER — ONDANSETRON HCL 4 MG/2ML IJ SOLN
INTRAMUSCULAR | Status: AC
  Filled 2018-03-29: qty 2

## 2018-03-29 MED ORDER — MIDAZOLAM HCL 2 MG/2ML IJ SOLN
INTRAMUSCULAR | Status: AC
  Filled 2018-03-29: qty 2

## 2018-03-29 MED ORDER — DEXAMETHASONE SODIUM PHOSPHATE 10 MG/ML IJ SOLN
INTRAMUSCULAR | Status: DC | PRN
  Administered 2018-03-29: 5 mg via INTRAVENOUS

## 2018-03-29 MED ORDER — ACETAMINOPHEN 500 MG PO TABS
1000.0000 mg | ORAL_TABLET | Freq: Once | ORAL | Status: AC
  Administered 2018-03-29: 1000 mg via ORAL
  Filled 2018-03-29: qty 2

## 2018-03-29 MED ORDER — BUPIVACAINE-EPINEPHRINE (PF) 0.25% -1:200000 IJ SOLN
INTRAMUSCULAR | Status: AC
  Filled 2018-03-29: qty 30

## 2018-03-29 MED ORDER — PROPOFOL 10 MG/ML IV BOLUS
INTRAVENOUS | Status: AC
  Filled 2018-03-29: qty 20

## 2018-03-29 MED ORDER — FENTANYL CITRATE (PF) 100 MCG/2ML IJ SOLN: 25.0000 ug | INTRAMUSCULAR | Status: DC | PRN

## 2018-03-29 MED ORDER — OXYCODONE-ACETAMINOPHEN 5-325 MG PO TABS: 0.5000 | ORAL_TABLET | ORAL | 0 refills | Status: DC | PRN

## 2018-03-29 MED ORDER — 0.9 % SODIUM CHLORIDE (POUR BTL) OPTIME
TOPICAL | Status: DC | PRN
  Administered 2018-03-29: 1000 mL

## 2018-03-29 MED ORDER — CEFAZOLIN SODIUM-DEXTROSE 2-4 GM/100ML-% IV SOLN
2.0000 g | INTRAVENOUS | Status: AC
  Administered 2018-03-29: 2 g via INTRAVENOUS

## 2018-03-29 MED ORDER — ONDANSETRON HCL 4 MG/2ML IJ SOLN: 4.0000 mg | Freq: Once | INTRAMUSCULAR | Status: DC | PRN

## 2018-03-29 MED ORDER — LACTATED RINGERS IV SOLN
INTRAVENOUS | Status: DC
  Administered 2018-03-29: 10 mL/h via INTRAVENOUS

## 2018-03-29 MED ORDER — ROCURONIUM BROMIDE 50 MG/5ML IV SOSY
PREFILLED_SYRINGE | INTRAVENOUS | Status: AC
  Filled 2018-03-29: qty 5

## 2018-03-29 MED ORDER — KETOROLAC TROMETHAMINE 30 MG/ML IJ SOLN
INTRAMUSCULAR | Status: DC | PRN
  Administered 2018-03-29: 15 mg via INTRAVENOUS

## 2018-03-29 MED ORDER — CELECOXIB 200 MG PO CAPS
200.0000 mg | ORAL_CAPSULE | Freq: Once | ORAL | Status: AC | PRN
  Administered 2018-03-29: 200 mg via ORAL
  Filled 2018-03-29: qty 1

## 2018-03-29 SURGICAL SUPPLY — 51 items
BIT DRILL Q COUPLING 4.5 (BIT) IMPLANT
BIT DRILL Q/COUPLING 1 (BIT) IMPLANT
BLADE AVERAGE 25MMX9MM (BLADE) ×1
BLADE AVERAGE 25X9 (BLADE) ×1 IMPLANT
CLEANER TIP ELECTROSURG 2X2 (MISCELLANEOUS) ×3 IMPLANT
CLOSURE STERI-STRIP 1/2X4 (GAUZE/BANDAGES/DRESSINGS) ×1
CLOSURE WOUND 1/2 X4 (GAUZE/BANDAGES/DRESSINGS) ×1
CLSR STERI-STRIP ANTIMIC 1/2X4 (GAUZE/BANDAGES/DRESSINGS) ×1 IMPLANT
DRAPE IMP U-DRAPE 54X76 (DRAPES) ×3 IMPLANT
DRAPE INCISE IOBAN 66X45 STRL (DRAPES) ×3 IMPLANT
DRAPE ORTHO SPLIT 77X108 STRL (DRAPES) ×6
DRAPE SURG ORHT 6 SPLT 77X108 (DRAPES) ×2 IMPLANT
DRAPE U-SHAPE 47X51 STRL (DRAPES) ×3 IMPLANT
DRSG EMULSION OIL 3X3 NADH (GAUZE/BANDAGES/DRESSINGS) ×3 IMPLANT
DRSG PAD ABDOMINAL 8X10 ST (GAUZE/BANDAGES/DRESSINGS) ×2 IMPLANT
DURAPREP 26ML APPLICATOR (WOUND CARE) ×3 IMPLANT
ELECT NDL TIP 2.8 STRL (NEEDLE) ×1 IMPLANT
ELECT NEEDLE TIP 2.8 STRL (NEEDLE) ×3 IMPLANT
ELECT REM PT RETURN 9FT ADLT (ELECTROSURGICAL) ×3
ELECTRODE REM PT RTRN 9FT ADLT (ELECTROSURGICAL) ×1 IMPLANT
GAUZE SPONGE 4X4 12PLY STRL (GAUZE/BANDAGES/DRESSINGS) ×3 IMPLANT
GLOVE BIOGEL PI ORTHO PRO 7.5 (GLOVE) ×2
GLOVE BIOGEL PI ORTHO PRO SZ8 (GLOVE) ×2
GLOVE ORTHO TXT STRL SZ7.5 (GLOVE) ×3 IMPLANT
GLOVE PI ORTHO PRO STRL 7.5 (GLOVE) ×1 IMPLANT
GLOVE PI ORTHO PRO STRL SZ8 (GLOVE) ×1 IMPLANT
GLOVE SURG ORTHO 8.5 STRL (GLOVE) ×3 IMPLANT
GOWN STRL REUS W/ TWL LRG LVL3 (GOWN DISPOSABLE) ×2 IMPLANT
GOWN STRL REUS W/ TWL XL LVL3 (GOWN DISPOSABLE) ×2 IMPLANT
GOWN STRL REUS W/TWL LRG LVL3 (GOWN DISPOSABLE) ×3
GOWN STRL REUS W/TWL XL LVL3 (GOWN DISPOSABLE) ×6
KIT BASIN OR (CUSTOM PROCEDURE TRAY) ×3 IMPLANT
KIT TURNOVER KIT B (KITS) ×3 IMPLANT
MANIFOLD NEPTUNE II (INSTRUMENTS) ×3 IMPLANT
NDL HYPO 25GX1X1/2 BEV (NEEDLE) ×1 IMPLANT
NEEDLE HYPO 25GX1X1/2 BEV (NEEDLE) ×3 IMPLANT
NS IRRIG 1000ML POUR BTL (IV SOLUTION) ×3 IMPLANT
PACK SHOULDER (CUSTOM PROCEDURE TRAY) ×3 IMPLANT
PACK UNIVERSAL I (CUSTOM PROCEDURE TRAY) ×3 IMPLANT
PAD ARMBOARD 7.5X6 YLW CONV (MISCELLANEOUS) ×6 IMPLANT
SLING ARM FOAM STRAP LRG (SOFTGOODS) ×3 IMPLANT
STRIP CLOSURE SKIN 1/2X4 (GAUZE/BANDAGES/DRESSINGS) ×3 IMPLANT
SUCTION FRAZIER HANDLE 10FR (MISCELLANEOUS) ×2
SUCTION TUBE FRAZIER 10FR DISP (MISCELLANEOUS) ×1 IMPLANT
SUT MNCRL AB 4-0 PS2 18 (SUTURE) ×3 IMPLANT
SUT VIC AB 0 CT2 27 (SUTURE) ×2 IMPLANT
SUT VIC AB 2-0 CT1 27 (SUTURE) ×3
SUT VIC AB 2-0 CT1 TAPERPNT 27 (SUTURE) ×1 IMPLANT
SYR CONTROL 10ML LL (SYRINGE) ×3 IMPLANT
TAPE CLOTH SURG 6X10 WHT LF (GAUZE/BANDAGES/DRESSINGS) ×2 IMPLANT
TOWEL OR 17X26 10 PK STRL BLUE (TOWEL DISPOSABLE) ×3 IMPLANT

## 2018-03-29 NOTE — Transfer of Care (Signed)
Immediate Anesthesia Transfer of Care Note  Patient: Phillip Powell  Procedure(s) Performed: LEFT SHOULDER OPEN DISTAL CLAVICLE RESECTION, CYST EXCISION (Left Shoulder)  Patient Location: PACU  Anesthesia Type:General  Level of Consciousness: awake and alert   Airway & Oxygen Therapy: Patient Spontanous Breathing  Post-op Assessment: Report given to RN and Post -op Vital signs reviewed and stable  Post vital signs: Reviewed and stable  Last Vitals:  Vitals Value Taken Time  BP 135/85 03/29/2018  2:31 PM  Temp    Pulse 68 03/29/2018  2:32 PM  Resp 19 03/29/2018  2:32 PM  SpO2 96 % 03/29/2018  2:32 PM  Vitals shown include unvalidated device data.  Last Pain:  Vitals:   03/29/18 0947  TempSrc:   PainSc: 0-No pain      Patients Stated Pain Goal: 3 (15/72/62 0355)  Complications: No apparent anesthesia complications

## 2018-03-29 NOTE — Anesthesia Preprocedure Evaluation (Addendum)
Anesthesia Evaluation  Patient identified by MRN, date of birth, ID band Patient awake    Reviewed: Allergy & Precautions, NPO status , Patient's Chart, lab work & pertinent test results, reviewed documented beta blocker date and time   History of Anesthesia Complications (+) PONV and history of anesthetic complications  Airway Mallampati: III  TM Distance: >3 FB Neck ROM: Full    Dental no notable dental hx. (+) Teeth Intact, Dental Advisory Given   Pulmonary sleep apnea and Continuous Positive Airway Pressure Ventilation ,    Pulmonary exam normal breath sounds clear to auscultation       Cardiovascular hypertension, Pt. on home beta blockers and Pt. on medications Normal cardiovascular exam Rhythm:Regular Rate:Normal  ECG: HR 75   Neuro/Psych  Headaches, negative psych ROS   GI/Hepatic Neg liver ROS, GERD  Controlled,  Endo/Other  diabetes, Oral Hypoglycemic Agents  Renal/GU negative Renal ROS     Musculoskeletal Brace to right hand   Abdominal (+) + obese,   Peds  Hematology negative hematology ROS (+)   Anesthesia Other Findings Left shoulder AC degenerative joint disease  Large cyst  Reproductive/Obstetrics                           Anesthesia Physical Anesthesia Plan  ASA: III  Anesthesia Plan: General   Post-op Pain Management:    Induction: Intravenous  PONV Risk Score and Plan: 3 and Ondansetron, Dexamethasone, Midazolam and Treatment may vary due to age or medical condition  Airway Management Planned: Oral ETT  Additional Equipment:   Intra-op Plan:   Post-operative Plan: Extubation in OR  Informed Consent: I have reviewed the patients History and Physical, chart, labs and discussed the procedure including the risks, benefits and alternatives for the proposed anesthesia with the patient or authorized representative who has indicated his/her understanding and  acceptance.   Dental advisory given  Plan Discussed with: CRNA  Anesthesia Plan Comments:        Anesthesia Quick Evaluation

## 2018-03-29 NOTE — Discharge Instructions (Addendum)
Ice to the shoulder as much as possible.  Do exercises to keep shoulder from stiffening up, pendulums, lap slides. Shoulder rolls.  No heavy pushing pulling or lifting with the left arm and hand. May remove the sling in the home, try to wear out of the house.   Keep the incision clean and dry for 5 days then ok to shower. May change surgical bandage to BandAids on Sunday.   Follow up with Dr Veverly Fells in two weeks

## 2018-03-29 NOTE — Anesthesia Procedure Notes (Signed)
Procedure Name: Intubation Date/Time: 03/29/2018 1:29 PM Performed by: Kyung Rudd, CRNA Pre-anesthesia Checklist: Patient identified, Emergency Drugs available, Suction available, Patient being monitored and Timeout performed Patient Re-evaluated:Patient Re-evaluated prior to induction Oxygen Delivery Method: Circle system utilized Preoxygenation: Pre-oxygenation with 100% oxygen Induction Type: IV induction Ventilation: Mask ventilation without difficulty Laryngoscope Size: Mac and 4 Grade View: Grade I Tube type: Oral Tube size: 7.5 mm Number of attempts: 1 Airway Equipment and Method: Stylet Placement Confirmation: ETT inserted through vocal cords under direct vision,  positive ETCO2 and breath sounds checked- equal and bilateral Secured at: 21 cm Tube secured with: Tape Dental Injury: Teeth and Oropharynx as per pre-operative assessment

## 2018-03-29 NOTE — Interval H&P Note (Signed)
History and Physical Interval Note:  03/29/2018 12:48 PM  Phillip Powell  has presented today for surgery, with the diagnosis of Left shoulder AC degenerative joint disease, large cyst  The various methods of treatment have been discussed with the patient and family. After consideration of risks, benefits and other options for treatment, the patient has consented to  Procedure(s): LEFT SHOULDER OPEN DISTAL CLAVICLE RESECTION, CYST EXCISION (Left) as a surgical intervention .  The patient's history has been reviewed, patient examined, no change in status, stable for surgery.  I have reviewed the patient's chart and labs.  Questions were answered to the patient's satisfaction.     [name],STEVEN R

## 2018-03-29 NOTE — Op Note (Signed)
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  PROCEDURE PERFORMED:  Left shoulder open cyst excision (excisional biopsy) with open Mumford procedure.  ATTENDING SURGEON:  Esmond Plants, MD  ASSISTANT:  Darol Destine, [place], who was scrubbed in during the entire procedure and necessary for satisfactory completion of surgery.  ANESTHESIA:  General anesthesia plus local anesthesia was used.  ESTIMATED BLOOD LOSS:  Less than 50 mL.  FLUID REPLACEMENT:  1000 mL crystalloid.  INSTRUMENT COUNTS:  Correct.  COMPLICATIONS:  None.  ANTIBIOTICS:  Given.  SPECIMEN:  x1 sent for final pathology.  INDICATIONS:  The patient is a 72 year old male with increasingly symptomatic and gradually enlarging AC joint cyst.  This cyst became larger than a golf ball, and he presents with significant pain.  The patient has advanced AC arthritis on x-ray.  Given  the increasing size of the cyst and its increase in symptoms and pain, the patient would like it removed.  The plan is for excisional biopsy with a distal clavicle resection to prevent a recurrence of this likely ganglion cyst.  Informed consent  obtained.  DESCRIPTION OF PROCEDURE:  After adequate level of anesthesia was achieved, the patient was positioned in modified beach-chair position.  Left shoulder was correctly identified and sterilely prepped and draped in the usual manner.  Timeout was called.   We used the patient's prior extended deltopectoral incision, the dorsal portion of that, and extended up in a saber fashion over the top of the [facility] joint.   Dissection down through subcutaneous tissues after a 10 blade scalpel for skin.  We used the Bovie  electrocautery to go down full thickness through subcu to the cyst wall and then dissect around the cyst, removed it in its entirety from surrounding tissues, and we were able to take it off the dorsal aspect of the deltotrapezial fascia.  We then did a  longitudinal incision after sending that for final pathology longitudinally in line with the distal clavicle.  We then did subperiosteal dissection of the distal clavicle followed by excision of distal 2-3 mm of bone using an oscillating saw.  Advanced  AC arthritis noted with large spurs on the dorsal acromion and dorsal clavicle.  Those were removed as a part of this procedure.  We also took out the hypertrophied and inflammatory capsule and remnant of the cyst base.  We had nice decompression of that  joint.  We placed bone wax on the cut end of the clavicle.  We irrigated thoroughly.  The anterior and posterior AC ligaments were intact.  We then did an anatomic deltopectoral repair with 0 Vicryl suture followed by 2-0 Vicryl for subcutaneous closure  and 4-0 Monocryl for skin.  Steri-Strips were applied followed by a sterile dressing.  The patient tolerated surgery well.  LN/NUANCE  D:03/29/2018 T:03/29/2018 JOB:[id number]

## 2018-03-29 NOTE — Brief Op Note (Signed)
03/29/2018  2:16 PM  PATIENT:  Phillip Powell  72 y.o. male  PRE-OPERATIVE DIAGNOSIS:  Left shoulder AC degenerative joint disease, large cyst  POST-OPERATIVE DIAGNOSIS:  Left shoulder AC degenerative joint disease, large cyst  PROCEDURE:  Procedure(s): LEFT SHOULDER OPEN DISTAL CLAVICLE RESECTION, CYST EXCISION (Left) excisional biopsy  SURGEON:  Surgeon(s) and Role:    Netta Cedars, MD - Primary  PHYSICIAN ASSISTANT:   ASSISTANTS: [place] Bruns, PA-C   ANESTHESIA:   local and general  EBL:  25 mL   BLOOD ADMINISTERED:none  DRAINS: none   LOCAL MEDICATIONS USED:  MARCAINE     SPECIMEN:  Source of Specimen:  mass left shoulder  DISPOSITION OF SPECIMEN:  PATHOLOGY  COUNTS:  YES  TOURNIQUET:  * No tourniquets in log *  DICTATION: .Other Dictation: Dictation Number [id number]  PLAN OF CARE: Discharge to home after PACU  PATIENT DISPOSITION:  PACU - hemodynamically stable.   Delay start of Pharmacological VTE agent (>24hrs) due to surgical blood loss or risk of bleeding: not applicable

## 2018-04-01 ENCOUNTER — Encounter (HOSPITAL_COMMUNITY): Payer: Self-pay | Admitting: Orthopedic Surgery

## 2018-04-01 NOTE — Anesthesia Postprocedure Evaluation (Signed)
Anesthesia Post Note  Patient: Phillip Powell  Procedure(s) Performed: LEFT SHOULDER OPEN DISTAL CLAVICLE RESECTION, CYST EXCISION (Left Shoulder)     Patient location during evaluation: PACU Anesthesia Type: General Level of consciousness: awake and alert Pain management: pain level controlled Vital Signs Assessment: post-procedure vital signs reviewed and stable Respiratory status: spontaneous breathing, nonlabored ventilation, respiratory function stable and patient connected to nasal cannula oxygen Cardiovascular status: blood pressure returned to baseline and stable Postop Assessment: no apparent nausea or vomiting Anesthetic complications: no    Last Vitals:  Vitals:   03/29/18 1520 03/29/18 1535  BP: (!) 148/80 (!) 151/79  Pulse: 64 64  Resp: 11 10  Temp:  (!) 36.3 C  SpO2: 96% 96%    Last Pain:  Vitals:   03/29/18 1535  TempSrc:   PainSc: 3                  Tiajuana Amass

## 2018-04-11 ENCOUNTER — Other Ambulatory Visit: Payer: Self-pay | Admitting: Adult Health

## 2018-04-15 ENCOUNTER — Ambulatory Visit: Payer: Self-pay | Admitting: Internal Medicine

## 2018-04-22 LAB — HM DIABETES EYE EXAM

## 2018-04-30 ENCOUNTER — Encounter: Payer: Self-pay | Admitting: *Deleted

## 2018-05-05 ENCOUNTER — Encounter: Payer: Self-pay | Admitting: Internal Medicine

## 2018-05-05 NOTE — Progress Notes (Signed)
RESCHEDULED

## 2018-05-06 ENCOUNTER — Ambulatory Visit: Payer: Self-pay | Admitting: Internal Medicine

## 2018-05-17 NOTE — Progress Notes (Signed)
FOLLOW UP  Assessment and Plan:   Hypertension Currently near goal at recheck, initially elevated - reminded to check BPs closely at home  Monitor blood pressure at home; patient to call if consistently greater than 130/80 Continue DASH diet.    Reminder to go to the ER if any CP, SOB, nausea, dizziness, severe HA, changes vision/speech, left arm numbness and tingling and jaw pain.  Cholesterol Not currently on treatment; has been on previously and had joint aches; declines statin Encouraged low cholesterol diet and exercise.  Check lipid panel.   Diabetes with diabetic chronic kidney disease Continue medications - janumet, glipizide- discussed increase to TID Continue diet and exercise.  Perform daily foot/skin check, notify office of any concerning changes.  Check A1C  Stage 3 CKD Increase fluids, avoid NSAIDS, monitor sugars, will monitor  Hypogonadism On zinc 50 mg daily; no longer on testosterone due to polycythemia  Obesity with co morbidities Long discussion about weight loss, diet, and exercise Discussed ideal weight for height and weight goal for next follow up (230 lb)  He is poorly motivated at this time to address lifestyle Has been off phentermine, declines to restart Will follow up in 3 months  Vitamin D Def Continue supplementation for goal 70-100 Defer Vit D level  Continue diet and meds as discussed. Further disposition pending results of labs. Discussed med's effects and SE's.   Over 30 minutes of exam, counseling, chart review, and critical decision making was performed.   Future Appointments  Date Time Provider [place]  10/14/2018 10:00 AM Unk Pinto, MD [facility] None    ----------------------------------------------------------------------------------------------------------------------  HPI 72 y.o. male  presents for 3 month follow up on hypertension, cholesterol, diabetes, obesity and vitamin D deficiency.   BMI is Body mass  index is 36.16 kg/m., he has not been working on lifestyle, has been recovering from surgery for shoulder and R hand this summer, he reports he watches portions. Drinks 4-5 bottles. He is poorly motivated to work on lifestyle and is resigned to "dying of diabetes" despite long conversation today.  Wt Readings from Last 3 Encounters:  05/20/18 237 lb 12.8 oz (107.9 kg)  03/29/18 230 lb (104.3 kg)  01/21/18 236 lb 12.8 oz (107.4 kg)   He does not check his BP at home, today their BP is BP: 136/88  He does not workout. He denies chest pain, shortness of breath, dizziness.   He is not on cholesterol medication and denies myalgias; he reports history of statin intolerance with arthralgias and declines medication. His cholesterol is not at goal. The cholesterol last visit was:   Lab Results  Component Value Date   CHOL 168 01/21/2018   HDL 33 (L) 01/21/2018   LDLCALC 83 01/21/2018   TRIG 392 (H) 01/21/2018   CHOLHDL 5.1 (H) 01/21/2018    He has not been working on diet, not exercising at all - for diabetes (on janumet 50-500 BID - cannot tolerate higher dose of metformin, glyburide 5 mg TID), and denies hyperglycemia, hypoglycemia , increased appetite, nausea, paresthesia of the feet, polydipsia, polyuria, visual disturbances and vomiting. He has not been checking sugars due to glucometer breaking - will reorder. Last A1C in the office was:  Lab Results  Component Value Date   HGBA1C 7.5 (H) 01/21/2018   Patient is on Vitamin D supplement and near goal of 70:  Lab Results  Component Value Date   VD25OH 63 09/12/2017       Current Medications:  Current Outpatient Medications  on File Prior to Visit  Medication Sig  . aspirin EC 81 MG tablet Take 81 mg by mouth daily.  Marland Kitchen atenolol (TENORMIN) 100 MG tablet Take 1 tablet daily for BP  . Cholecalciferol (VITAMIN D3) 2000 units TABS Take 4,000 Units by mouth 2 (two) times daily.  . Cinnamon 500 MG capsule Take 2,000 mg by mouth 2 (two) times  daily.  Marland Kitchen glyBURIDE (DIABETA) 5 MG tablet TAKE 1/2 TO 1 TABLET BY MOUTH THREE TIMES DAILY AFTER MEALS (Patient taking differently: Take 5 mg by mouth 3 (three) times daily after meals. )  . hydrochlorothiazide (HYDRODIURIL) 25 MG tablet TAKE 1 TABLET BY MOUTH EVERY MORNING FOR BLOOD PRESSURE (Patient taking differently: Take 25 mg by mouth daily. )  . losartan (COZAAR) 100 MG tablet TAKE 1 TABLET(100 MG) BY MOUTH DAILY (Patient taking differently: Take 100 mg by mouth daily. )  . Magnesium 500 MG TABS Take 500 mg by mouth daily.  . meclizine (ANTIVERT) 25 MG tablet Take 25 mg by mouth 3 (three) times daily as needed for dizziness.  . meloxicam (MOBIC) 15 MG tablet TAKE 1 TABLET BY MOUTH DAILY WITH FOOD (Patient taking differently: Take 15 mg by mouth daily. )  . ranitidine (ZANTAC) 300 MG capsule TAKE 1 CAPSULE BY MOUTH EVERY DAY (Patient taking differently: Take 300 mg by mouth daily. )  . sitaGLIPtin-metformin (JANUMET) 50-500 MG tablet Take 1 tablet by mouth 2 (two) times daily with a meal.  . tamsulosin (FLOMAX) 0.4 MG CAPS capsule TAKE 1 CAPSULE(0.4 MG) BY MOUTH DAILY (Patient taking differently: Take 0.4 mg by mouth daily. )  . TURMERIC PO Take 1 capsule by mouth daily.  Marland Kitchen zinc gluconate 50 MG tablet Take 50 mg by mouth daily.  . B-D 3CC LUER-LOK SYR 21GX1" 21G X 1" 3 ML MISC use as directed (Patient not taking: Reported on 03/26/2018)  . vitamin C (ASCORBIC ACID) 500 MG tablet Take 500 mg by mouth 2 (two) times daily.   No current facility-administered medications on file prior to visit.      Allergies:  Allergies  Allergen Reactions  . Ace Inhibitors Other (See Comments)    Unknown  . Ibuprofen Other (See Comments)    GI upset  . Invokana [Canagliflozin] Other (See Comments)    Unknown  . Metformin And Related Diarrhea  . Quinidine Other (See Comments)    Unknown     Medical History:  Past Medical History:  Diagnosis Date  . BPH (benign prostatic hypertrophy)   . Diabetes  mellitus without complication ([place])   . GERD (gastroesophageal reflux disease)   . Headache    PMH  . Hypertension   . Hypogonadism male   . Mass    left shoulder AC joint  . OSA (obstructive sleep apnea)    wears CPAP  . PONV (postoperative nausea and vomiting)   . Sarcoidosis   . Vitamin D deficiency    Family history- Reviewed and unchanged Social history- Reviewed and unchanged   Review of Systems:  Review of Systems  Constitutional: Negative for malaise/fatigue and weight loss.  HENT: Negative for hearing loss and tinnitus.   Eyes: Negative for blurred vision and double vision.  Respiratory: Negative for cough, shortness of breath and wheezing.   Cardiovascular: Negative for chest pain, palpitations, orthopnea, claudication and leg swelling.  Gastrointestinal: Negative for abdominal pain, blood in stool, constipation, diarrhea, heartburn, melena, nausea and vomiting.  Genitourinary: Negative.   Musculoskeletal: Positive for joint pain (bilateral knees,  needs TKA). Negative for myalgias.  Skin: Negative for rash.  Neurological: Negative for dizziness, tingling, sensory change, weakness and headaches.  Endo/Heme/Allergies: Negative for polydipsia.  Psychiatric/Behavioral: Negative.   All other systems reviewed and are negative.   Physical Exam: BP 136/88   Pulse 67   Temp (!) 97.3 F (36.3 C)   Ht 5\' 8"  (1.727 m)   Wt 237 lb 12.8 oz (107.9 kg)   SpO2 96%   BMI 36.16 kg/m  Wt Readings from Last 3 Encounters:  05/20/18 237 lb 12.8 oz (107.9 kg)  03/29/18 230 lb (104.3 kg)  01/21/18 236 lb 12.8 oz (107.4 kg)   General Appearance: Well nourished, in no apparent distress. Eyes: PERRLA, EOMs, conjunctiva no swelling or erythema Sinuses: No Frontal/maxillary tenderness ENT/Mouth: Ext aud canals clear, TMs without erythema, bulging. No erythema, swelling, or exudate on post pharynx.  Tonsils not swollen or erythematous. Hearing normal.  Neck: Supple, thyroid normal.   Respiratory: Respiratory effort normal, BS equal bilaterally without rales, rhonchi, wheezing or stridor.  Cardio: RRR with no MRGs. Brisk peripheral pulses without edema.  Abdomen: Soft obese abdomen, + BS.  Non tender, no guarding, rebound, hernias, masses. Lymphatics: Non tender without lymphadenopathy.  Musculoskeletal: Full ROM, except bilateral knees with obvious bony deformity, valgus, 5/5 strength,antalgic gait Skin: Warm, dry without rashes, lesions, ecchymosis.  Neuro: Cranial nerves intact. No cerebellar symptoms.  Psych: Awake and oriented X 3, normal affect, Insight and Judgment appropriate.    Izora Ribas, NP 10:26 AM [facility]

## 2018-05-20 ENCOUNTER — Ambulatory Visit: Payer: Managed Care, Other (non HMO) | Admitting: Adult Health

## 2018-05-20 ENCOUNTER — Telehealth: Payer: Self-pay

## 2018-05-20 ENCOUNTER — Encounter: Payer: Self-pay | Admitting: Adult Health

## 2018-05-20 DIAGNOSIS — N183 Chronic kidney disease, stage 3 unspecified: Secondary | ICD-10-CM

## 2018-05-20 DIAGNOSIS — E1169 Type 2 diabetes mellitus with other specified complication: Secondary | ICD-10-CM | POA: Diagnosis not present

## 2018-05-20 DIAGNOSIS — Z79899 Other long term (current) drug therapy: Secondary | ICD-10-CM | POA: Diagnosis not present

## 2018-05-20 DIAGNOSIS — I10 Essential (primary) hypertension: Secondary | ICD-10-CM | POA: Diagnosis not present

## 2018-05-20 DIAGNOSIS — E1122 Type 2 diabetes mellitus with diabetic chronic kidney disease: Secondary | ICD-10-CM | POA: Diagnosis not present

## 2018-05-20 DIAGNOSIS — E669 Obesity, unspecified: Secondary | ICD-10-CM | POA: Diagnosis not present

## 2018-05-20 DIAGNOSIS — E559 Vitamin D deficiency, unspecified: Secondary | ICD-10-CM | POA: Diagnosis not present

## 2018-05-20 DIAGNOSIS — E785 Hyperlipidemia, unspecified: Secondary | ICD-10-CM | POA: Diagnosis not present

## 2018-05-20 MED ORDER — BLOOD GLUCOSE MONITORING SUPPL DEVI: 0 refills | Status: AC

## 2018-05-20 MED ORDER — GLUCOSE BLOOD VI STRP: ORAL_STRIP | 12 refills | Status: DC

## 2018-05-20 MED ORDER — FREESTYLE LITE DEVI: 0 refills | Status: DC

## 2018-05-20 MED ORDER — GLUCOSE BLOOD VI STRP: ORAL_STRIP | 2 refills | Status: DC

## 2018-05-20 MED ORDER — LANCETS MISC: 2 refills | Status: DC

## 2018-05-20 NOTE — Patient Instructions (Signed)
Goals    . Blood Pressure < 130/80    . HEMOGLOBIN A1C < 7.0    . LDL CALC < 70    . Weight (lb) < 230 lb (104.3 kg)       Know what a healthy weight is for you (roughly BMI <25) and aim to maintain this  Aim for 7+ servings of fruits and vegetables daily  65-80+ fluid ounces of water or unsweet tea for healthy kidneys  Limit to max 1 drink of alcohol per day; avoid smoking/tobacco  Limit animal fats in diet for cholesterol and heart health - choose grass fed whenever available  Avoid highly processed foods, and foods high in saturated/trans fats  Aim for low stress - take time to unwind and care for your mental health  Aim for 150 min of moderate intensity exercise weekly for heart health, and weights twice weekly for bone health  Aim for 7-9 hours of sleep daily     Diabetes Mellitus and Standards of Medical Care Managing diabetes (diabetes mellitus) can be complicated. Your diabetes treatment may be managed by a team of health care providers, including:  A diet and nutrition specialist (registered dietitian).  A nurse.  A certified diabetes educator (CDE).  A diabetes specialist (endocrinologist).  An eye doctor.  A primary care provider.  A dentist.  Your health care providers follow a schedule in order to help you get the best quality of care. The following schedule is a general guideline for your diabetes management plan. Your health care providers may also give you more specific instructions. HbA1c ( hemoglobin A1c) test This test provides information about blood sugar (glucose) control over the previous 2-3 months. It is used to check whether your diabetes management plan needs to be adjusted.  If you are meeting your treatment goals, this test is done at least 2 times a year.  If you are not meeting treatment goals or if your treatment goals have changed, this test is done 4 times a year.  Blood pressure test  This test is done at every routine  medical visit. For most people, the goal is less than 130/80. Ask your health care provider what your goal blood pressure should be. Dental and eye exams  Visit your dentist two times a year.  If you have type 1 diabetes, get an eye exam 3-5 years after you are diagnosed, and then once a year after your first exam. ? If you were diagnosed with type 1 diabetes as a child, get an eye exam when you are age 21 or older and have had diabetes for 3-5 years. After the first exam, you should get an eye exam once a year.  If you have type 2 diabetes, have an eye exam as soon as you are diagnosed, and then once a year after your first exam. Foot care exam  Visual foot exams are done at every routine medical visit. The exams check for cuts, bruises, redness, blisters, sores, or other problems with the feet.  A complete foot exam is done by your health care provider once a year. This exam includes an inspection of the structure and skin of your feet, and a check of the pulses and sensation in your feet. ? Type 1 diabetes: Get your first exam 3-5 years after diagnosis. ? Type 2 diabetes: Get your first exam as soon as you are diagnosed.  Check your feet every day for cuts, bruises, redness, blisters, or sores. If you have any  of these or other problems that are not healing, contact your health care provider. Kidney function test ( urine microalbumin)  This test is done once a year. ? Type 1 diabetes: Get your first test 5 years after diagnosis. ? Type 2 diabetes: Get your first test as soon as you are diagnosed.  If you have chronic kidney disease (CKD), get a serum creatinine and estimated glomerular filtration rate (eGFR) test once a year. Lipid profile (cholesterol, HDL, LDL, triglycerides)  This test should be done when you are diagnosed with diabetes, and every 5 years after the first test. If you are on medicines to lower your cholesterol, you may need to get this test done every year. ? The  goal for LDL is less than 100 mg/dL (5.5 mmol/L). If you are at high risk, the goal is less than 70 mg/dL (3.9 mmol/L). ? The goal for HDL is 40 mg/dL (2.2 mmol/L) for men and 50 mg/dL(2.8 mmol/L) for women. An HDL cholesterol of 60 mg/dL (3.3 mmol/L) or higher gives some protection against heart disease. ? The goal for triglycerides is less than 150 mg/dL (8.3 mmol/L). Immunizations  The yearly flu (influenza) vaccine is recommended for everyone 6 months or older who has diabetes.  The pneumonia (pneumococcal) vaccine is recommended for everyone 2 years or older who has diabetes. If you are 25 or older, you may get the pneumonia vaccine as a series of two separate shots.  The hepatitis B vaccine is recommended for adults shortly after they have been diagnosed with diabetes.  The Tdap (tetanus, diphtheria, and pertussis) vaccine should be given: ? According to normal childhood vaccination schedules, for children. ? Every 10 years, for adults who have diabetes.  The shingles vaccine is recommended for people who have had chicken pox and are 50 years or older. Mental and emotional health  Screening for symptoms of eating disorders, anxiety, and depression is recommended at the time of diagnosis and afterward as needed. If your screening shows that you have symptoms (you have a positive screening result), you may need further evaluation and be referred to a mental health care provider. Diabetes self-management education  Education about how to manage your diabetes is recommended at diagnosis and ongoing as needed. Treatment plan  Your treatment plan will be reviewed at every medical visit. Summary  Managing diabetes (diabetes mellitus) can be complicated. Your diabetes treatment may be managed by a team of health care providers.  Your health care providers follow a schedule in order to help you get the best quality of care.  Standards of care including having regular physical exams, blood  tests, blood pressure monitoring, immunizations, screening tests, and education about how to manage your diabetes.  Your health care providers may also give you more specific instructions based on your individual health. This information is not intended to replace advice given to you by your health care provider. Make sure you discuss any questions you have with your health care provider. Document Released: 05/07/2009 Document Revised: 04/07/2016 Document Reviewed: 04/07/2016 Elsevier Interactive Patient Education  [employer].

## 2018-05-20 NOTE — Telephone Encounter (Signed)
Pharmacy needs new rx for Diabetic meter, strips and lancets. Insurance does not cover [employer]

## 2018-05-21 ENCOUNTER — Other Ambulatory Visit: Payer: Self-pay | Admitting: Adult Health

## 2018-05-21 DIAGNOSIS — M170 Bilateral primary osteoarthritis of knee: Secondary | ICD-10-CM

## 2018-05-21 LAB — COMPLETE METABOLIC PANEL WITH GFR
AG Ratio: 1.9 (calc) (ref 1.0–2.5)
ALT: 14 U/L (ref 9–46)
AST: 13 U/L (ref 10–35)
Albumin: 4 g/dL (ref 3.6–5.1)
Alkaline phosphatase (APISO): 68 U/L (ref 40–115)
BUN/Creatinine Ratio: 23 (calc) — ABNORMAL HIGH (ref 6–22)
BUN: 27 mg/dL — ABNORMAL HIGH (ref 7–25)
CO2: 29 mmol/L (ref 20–32)
Calcium: 9.7 mg/dL (ref 8.6–10.3)
Chloride: 102 mmol/L (ref 98–110)
Creat: 1.2 mg/dL — ABNORMAL HIGH (ref 0.70–1.18)
GFR, Est African American: 71 mL/min/{1.73_m2} (ref >=60)
GFR, Est Non African American: 61 mL/min/{1.73_m2} (ref >=60)
Globulin: 2.1 g/dL (calc) (ref 1.9–3.7)
Glucose, Bld: 248 mg/dL — ABNORMAL HIGH (ref 65–99)
Potassium: 4.6 mmol/L (ref 3.5–5.3)
Sodium: 139 mmol/L (ref 135–146)
Total Bilirubin: 0.4 mg/dL (ref 0.2–1.2)
Total Protein: 6.1 g/dL (ref 6.1–8.1)

## 2018-05-21 LAB — CBC WITH DIFFERENTIAL/PLATELET
Basophils Absolute: 69 cells/uL (ref 0–200)
Basophils Relative: 1 %
Eosinophils Absolute: 248 cells/uL (ref 15–500)
Eosinophils Relative: 3.6 %
HCT: 41.1 % (ref 38.5–50.0)
Hemoglobin: 13.6 g/dL (ref 13.2–17.1)
Lymphs Abs: 1973 cells/uL (ref 850–3900)
MCH: 27.9 pg (ref 27.0–33.0)
MCHC: 33.1 g/dL (ref 32.0–36.0)
MCV: 84.2 fL (ref 80.0–100.0)
MPV: 9.8 fL (ref 7.5–12.5)
Monocytes Relative: 6.8 %
Neutro Abs: 4140 cells/uL (ref 1500–7800)
Neutrophils Relative %: 60 %
Platelets: 207 10*3/uL (ref 140–400)
RBC: 4.88 10*6/uL (ref 4.20–5.80)
RDW: 13.7 % (ref 11.0–15.0)
Total Lymphocyte: 28.6 %
WBC mixed population: 469 cells/uL (ref 200–950)
WBC: 6.9 10*3/uL (ref 3.8–10.8)

## 2018-05-21 LAB — HEMOGLOBIN A1C
Hgb A1c MFr Bld: 7 % of total Hgb — ABNORMAL HIGH (ref <5.7)
Mean Plasma Glucose: 154 (calc)
eAG (mmol/L): 8.5 (calc)

## 2018-05-21 LAB — LIPID PANEL
Cholesterol: 161 mg/dL (ref <200)
HDL: 31 mg/dL — ABNORMAL LOW (ref >40)
Non-HDL Cholesterol (Calc): 130 mg/dL (calc) — ABNORMAL HIGH (ref <130)
Total CHOL/HDL Ratio: 5.2 (calc) — ABNORMAL HIGH (ref <5.0)
Triglycerides: 435 mg/dL — ABNORMAL HIGH (ref <150)

## 2018-05-21 LAB — TSH: TSH: 1.42 mIU/L (ref 0.40–4.50)

## 2018-05-21 LAB — MAGNESIUM: Magnesium: 1.6 mg/dL (ref 1.5–2.5)

## 2018-05-21 MED ORDER — OMEGA-3 FISH OIL 1200 MG PO CAPS: 1.0000 | ORAL_CAPSULE | Freq: Three times a day (TID) | ORAL | Status: DC

## 2018-05-21 MED ORDER — MAGNESIUM 500 MG PO TABS: 250.0000 mg | ORAL_TABLET | Freq: Three times a day (TID) | ORAL | Status: DC

## 2018-05-21 MED ORDER — DICLOFENAC SODIUM 1 % TD GEL: 4.0000 g | Freq: Four times a day (QID) | TRANSDERMAL | 3 refills | Status: DC

## 2018-06-12 ENCOUNTER — Other Ambulatory Visit: Payer: Self-pay | Admitting: Internal Medicine

## 2018-07-10 ENCOUNTER — Other Ambulatory Visit: Payer: Self-pay | Admitting: Internal Medicine

## 2018-09-11 ENCOUNTER — Other Ambulatory Visit: Payer: Self-pay | Admitting: Internal Medicine

## 2018-09-17 ENCOUNTER — Telehealth: Payer: Self-pay

## 2018-09-17 NOTE — Telephone Encounter (Signed)
Patient notified

## 2018-09-17 NOTE — Telephone Encounter (Signed)
Patient has a cough and congestion. Would like to know what he can take since he has diabetes.

## 2018-10-13 ENCOUNTER — Encounter: Payer: Self-pay | Admitting: Internal Medicine

## 2018-10-13 NOTE — Progress Notes (Signed)
[facility]   Phillip Powell, M.D.     Phillip Powell, P.A.-C Phillip Powell, [place]                [address] [place], [place] [address] Telephone [phone] Telefax [phone] Annual  Screening/Preventative Visit  & Comprehensive Evaluation & Examination     This very nice 73 y.o. MWM presents for a Screening /Preventative Visit & comprehensive evaluation and management of multiple medical co-morbidities.  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 Patient has hx/o Gout quiescent. Also he is on CPAP for OSA with reported improved sleep hygiene & less hypersomnolence. Patient has GERD controlled on his meds.     HTN predates since 36. Patient's BP has been controlled at home.  Today's BP is at goal - 140/80. Patient denies any cardiac symptoms as chest pain, palpitations, shortness of breath, dizziness or ankle swelling.     Patient's hyperlipidemia is controlled with diet and medications. Patient denies myalgias or other medication SE's. Last lipids were at goal except elevated Trig's: Lab Results  Component Value Date   CHOL 161 05/20/2018   HDL 31 (L) 05/20/2018   LDLCALC not calculated 05/20/2018    TRIG 435 (H) 05/20/2018   CHOLHDL 5.2 (H) 05/20/2018      Patient has Morbid Obesity (BMI 36+) and hx/o T2_NIDDM/CKD (1997)/. Patient's wife endorses his Gluttony  and patient denies reactive hypoglycemic symptoms, visual blurring, diabetic polys or paresthesias. Last A1c was not at goal: Lab Results  Component Value Date   HGBA1C 7.0 (H) 05/20/2018       Finally, patient has history of Vitamin D Deficiency ("37" / 2008)  and last vitamin D was at goal: Lab Results  Component Value Date   VD25OH 63 09/12/2017   Current Outpatient Medications on File Prior to Visit  Medication Sig  . aspirin EC 81 MG tablet Take 81 mg by mouth daily.  Marland Kitchen  atenolol (TENORMIN) 100 MG tablet Take 1 tablet daily for BP  . B-D 3CC LUER-LOK SYR 21GX1" 21G X 1" 3 ML MISC use as directed  . Blood Glucose Monitoring Suppl DEVI Use meter three times a day to check blood sugar  . Cholecalciferol (VITAMIN D3) 2000 units TABS Take 4,000 Units by mouth 2 (two) times daily.  . Cinnamon 500 MG capsule Take 2,000 mg by mouth 2 (two) times daily.  Marland Kitchen glucose blood test strip Test blood sugar three times a day  . glyBURIDE (DIABETA) 5 MG tablet Take 1/2 to 1 tablet 3 x /day before meals as directed for Diabetes  . hydrochlorothiazide (HYDRODIURIL) 25 MG tablet TAKE 1 TABLET BY MOUTH EVERY MORNING FOR BLOOD PRESSURE (Patient taking differently: Take 25 mg by mouth daily. )  . Lancets MISC Check blood sugars up to three times daily  . losartan (COZAAR) 100 MG tablet TAKE 1 TABLET(100 MG) BY MOUTH DAILY (Patient taking differently: Take 100 mg by mouth daily. )  . Magnesium 500 MG TABS Take 0.5 tablets (250 mg total) by mouth 3 (three) times daily.  . meclizine (ANTIVERT) 25 MG tablet Take 25 mg by mouth 3 (three) times daily as needed for dizziness.  . meloxicam (MOBIC) 15 MG tablet Take 1/2 to 1 tablet daily with food for pain & Inflammation  . ranitidine (ZANTAC) 300 MG capsule TAKE  1 CAPSULE BY MOUTH EVERY DAY (Patient taking differently: Take 300 mg by mouth daily. )  . sitaGLIPtin-metformin (JANUMET) 50-500 MG tablet Take 1 tablet by mouth 2 (two) times daily with a meal.  . tamsulosin (FLOMAX) 0.4 MG CAPS capsule TAKE 1 CAPSULE(0.4 MG) BY MOUTH DAILY  . TURMERIC PO Take 1 capsule by mouth daily.  . vitamin C (ASCORBIC ACID) 500 MG tablet Take 500 mg by mouth 2 (two) times daily.  Marland Kitchen zinc gluconate 50 MG tablet Take 50 mg by mouth daily.   No current facility-administered medications on file prior to visit.    Allergies  Allergen Reactions  . Ace Inhibitors Other (See Comments)    Unknown  . Ibuprofen Other (See Comments)    GI upset  . Invokana  [Canagliflozin] Other (See Comments)    Unknown  . Metformin And Related Diarrhea  . Quinidine Other (See Comments)    Unknown   Past Medical History:  Diagnosis Date  . BPH (benign prostatic hypertrophy)   . Diabetes mellitus without complication ([place])   . GERD (gastroesophageal reflux disease)   . Headache    PMH  . Hypertension   . Hypogonadism male   . Mass    left shoulder AC joint  . OSA (obstructive sleep apnea)    wears CPAP  . PONV (postoperative nausea and vomiting)   . Sarcoidosis   . Vitamin D deficiency    Health Maintenance  Topic Date Due  . INFLUENZA VACCINE  02/21/2018  . HEMOGLOBIN A1C  11/19/2018  . OPHTHALMOLOGY EXAM  04/23/2019  . FOOT EXAM  10/13/2019  . Fecal DNA (Cologuard)  10/08/2020  . TETANUS/TDAP  08/12/2022  . Hepatitis C Screening  Completed  . PNA vac Low Risk Adult  Completed   Immunization History  Administered Date(s) Administered  . Influenza Split 06/23/2013  . Influenza, High Dose Seasonal PF 06/29/2014, 05/24/2015, 04/24/2016, 06/11/2017  . Pneumococcal Conjugate-13 01/05/2014  . Pneumococcal-Unspecified 07/24/1994  . Td 06/23/2002  . Zoster 10/23/2010   10/08/2017 - (+) Positive Cologard then  12/31/2017 - Colonoscopy - Dr Collene Mares - Negative - recc 10 yr f/u  Past Surgical History:  Procedure Laterality Date  . APPENDECTOMY    . COLONOSCOPY    . HAND SURGERY Right 2019   Dr. Amedeo Plenty, tendon repair of R thumb  . HERNIA REPAIR  1962   umbilical  . KNEE ARTHROSCOPY    . ORIF CLAVICULAR FRACTURE Left 03/29/2018   Procedure: LEFT SHOULDER OPEN DISTAL CLAVICLE RESECTION, CYST EXCISION;  Surgeon: Netta Cedars, MD;  Location: [facility];  Service: Orthopedics;  Laterality: Left;  . ROTATOR CUFF REPAIR Left 1996  . ROTATOR CUFF REPAIR Right 1997  . TONSILLECTOMY  1952   Family History  Problem Relation Age of Onset  . Thyroid disease Father   . Cancer Father        lung  . Cancer Mother        throat  . Diabetes Mother   .  Thyroid disease Sister   . Hypertension Son   . Hyperlipidemia Son   . Cancer Paternal Grandmother        colon   Social History   Socioeconomic History  . Marital status: Married    Spouse name: Phillip Powell  . Number of children: 1 son & 1 daughter & 3 GC  Occupational History  . 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 Smoking status: Never Smoker  . Smokeless tobacco: Never Used  Substance  and Sexual Activity  . Alcohol use: Yes    Comment: very rarely  . Drug use: Never  . Sexual activity: Not on file    ROS Constitutional: Denies fever, chills, weight loss/gain, headaches, insomnia,  night sweats or change in appetite. Does c/o fatigue. Eyes: Denies redness, blurred vision, diplopia, discharge, itchy or watery eyes.  ENT: Denies discharge, congestion, post nasal drip, epistaxis, sore throat, earache, hearing loss, dental pain, Tinnitus, Vertigo, Sinus pain or snoring.  Cardio: Denies chest pain, palpitations, irregular heartbeat, syncope, dyspnea, diaphoresis, orthopnea, PND, claudication or edema Respiratory: denies cough, dyspnea, DOE, pleurisy, hoarseness, laryngitis or wheezing.  Gastrointestinal: Denies dysphagia, heartburn, reflux, water brash, pain, cramps, nausea, vomiting, bloating, diarrhea, constipation, hematemesis, melena, hematochezia, jaundice or hemorrhoids Genitourinary: Denies dysuria, frequency, discharge, hematuria or flank pain.  Has urgency, nocturia x 2-3 & occasional hesitancy. Musculoskeletal: Denies arthralgia, myalgia, stiffness, Jt. Swelling, pain, limp or strain/sprain. Denies Falls. Skin: Denies puritis, rash, hives, warts, acne, eczema or change in skin lesion Neuro: No weakness, tremor, incoordination, spasms, paresthesia or pain Psychiatric: Denies confusion, memory loss or sensory loss. Denies Depression. Endocrine: Denies change in weight, skin, hair change, nocturia, and paresthesia, diabetic polys, visual blurring or hyper / hypo glycemic  episodes.  Heme/Lymph: No excessive bleeding, bruising or enlarged lymph nodes.  Physical Exam  BP 140/80   Pulse 72   Temp 97.6 F (36.4 C)   Resp 16   Ht 5\' 8"  (1.727 m)   Wt 238 lb 9.6 oz (108.2 kg)   BMI 36.28 kg/m   General Appearance: Over nourished and well groomed and in no apparent distress.  Eyes: PERRLA, EOMs, conjunctiva no swelling or erythema, normal fundi and vessels. Sinuses: No frontal/maxillary tenderness ENT/Mouth: EACs patent / TMs  nl. Nares clear without erythema, swelling, mucoid exudates. Oral hygiene is good. No erythema, swelling, or exudate. Tongue normal, non-obstructing. Tonsils not swollen or erythematous. Hearing normal.  Neck: Supple, thyroid not palpable. No bruits, nodes or JVD. Respiratory: Respiratory effort normal.  BS equal and clear bilateral without rales, rhonci, wheezing or stridor. Cardio: Heart sounds are normal with regular rate and rhythm and no murmurs, rubs or gallops. Peripheral pulses are normal and equal bilaterally without edema. No aortic or femoral bruits. Chest: symmetric with normal excursions and percussion.  Abdomen: Soft, with Nl bowel sounds. Nontender, no guarding, rebound, hernias, masses, or organomegaly.  Lymphatics: Non tender without lymphadenopathy.  Musculoskeletal: Full ROM all peripheral extremities, joint stability, 5/5 strength, and normal gait. Skin: Warm and dry without rashes, lesions, cyanosis, clubbing or  ecchymosis.  Neuro: Cranial nerves intact, reflexes equal bilaterally. Normal muscle tone, no cerebellar symptoms. Sensation intact.  Pysch: Alert and oriented X 3 with normal affect, insight and judgment appropriate.   Assessment and Plan  1. Annual Preventative/Screening Exam   2. Essential hypertension  - EKG 12-Lead - [place], RETROPERITNL ABD,  LTD - Urinalysis, Routine w reflex microscopic - Microalbumin / creatinine urine ratio - CBC with Differential/Platelet - COMPLETE METABOLIC PANEL WITH  GFR - Magnesium - TSH  3. Hyperlipidemia, mixed  - EKG 12-Lead - [place], RETROPERITNL ABD,  LTD - Lipid panel - TSH  4. Type 2 diabetes mellitus with stage 2 chronic kidney disease, without long-term current use of insulin (HCC)  - EKG 12-Lead - [place], RETROPERITNL ABD,  LTD - Urinalysis, Routine w reflex microscopic - Microalbumin / creatinine urine ratio - HM DIABETES FOOT EXAM - LOW EXTREMITY NEUR EXAM DOCUM - Hemoglobin A1c - Insulin, random  5. Vitamin D deficiency  - VITAMIN D 25 Hydroxyl  6. Class 2 severe obesity due to excess calories with serious comorbidity and body mass index (BMI) of 35.0 to 35.9 in adult ([place])   7. Screening for colorectal cancer  - POC Hemoccult Bld/Stl   8. BPH with obstruction/lower urinary tract symptoms  - PSA  9. Prostate cancer screening  - PSA  10. Gastroesophageal reflux disease  11. Idiopathic chronic gout   - Uric acid  12. Screening for ischemic heart disease  - EKG 12-Lead  13. FH: hypertension  - EKG 12-Lead - [place], RETROPERITNL ABD,  LTD  14. Aortic atherosclerosis (HCC)  - [place], RETROPERITNL ABD,  LTD  15. Screening-pulmonary TB  - TB Skin Test  16. Screening for AAA (aortic abdominal aneurysm)  - [place], RETROPERITNL ABD,  LTD  17. Fatigue, unspecified type  - Iron,Total/Total Iron Binding Cap - Vitamin B12 - CBC with Differential/Platelet  18. Gluttony   19. Medication management  - Urinalysis, Routine w reflex microscopic - Microalbumin / creatinine urine ratio - CBC with Differential/Platelet - COMPLETE METABOLIC PANEL WITH GFR - Magnesium - Lipid panel - TSH - Hemoglobin A1c - Insulin, random - VITAMIN D 25 Hydroxyl         Patient was counseled in prudent diet, weight control to achieve/maintain BMI less than 25, BP monitoring, regular exercise and medications as discussed.  Discussed med effects and SE's. Routine screening labs and tests as requested with regular follow-up as recommended.  Over 40 minutes of exam, counseling, chart review and high complex critical decision making was performed

## 2018-10-13 NOTE — Patient Instructions (Signed)

## 2018-10-14 ENCOUNTER — Other Ambulatory Visit: Payer: Self-pay

## 2018-10-14 ENCOUNTER — Ambulatory Visit (INDEPENDENT_AMBULATORY_CARE_PROVIDER_SITE_OTHER): Payer: Managed Care, Other (non HMO) | Admitting: Internal Medicine

## 2018-10-14 VITALS — BP 140/80 | HR 72 | Temp 97.6°F | Resp 16 | Ht 68.0 in | Wt 238.6 lb

## 2018-10-14 DIAGNOSIS — Z1329 Encounter for screening for other suspected endocrine disorder: Secondary | ICD-10-CM

## 2018-10-14 DIAGNOSIS — Z130 Encounter for screening for diseases of the blood and blood-forming organs and certain disorders involving the immune mechanism: Secondary | ICD-10-CM

## 2018-10-14 DIAGNOSIS — N401 Enlarged prostate with lower urinary tract symptoms: Secondary | ICD-10-CM

## 2018-10-14 DIAGNOSIS — I10 Essential (primary) hypertension: Secondary | ICD-10-CM | POA: Diagnosis not present

## 2018-10-14 DIAGNOSIS — E1122 Type 2 diabetes mellitus with diabetic chronic kidney disease: Secondary | ICD-10-CM

## 2018-10-14 DIAGNOSIS — Z8249 Family history of ischemic heart disease and other diseases of the circulatory system: Secondary | ICD-10-CM

## 2018-10-14 DIAGNOSIS — E559 Vitamin D deficiency, unspecified: Secondary | ICD-10-CM | POA: Diagnosis not present

## 2018-10-14 DIAGNOSIS — Z13220 Encounter for screening for lipoid disorders: Secondary | ICD-10-CM | POA: Diagnosis not present

## 2018-10-14 DIAGNOSIS — Z131 Encounter for screening for diabetes mellitus: Secondary | ICD-10-CM | POA: Diagnosis not present

## 2018-10-14 DIAGNOSIS — R632 Polyphagia: Secondary | ICD-10-CM

## 2018-10-14 DIAGNOSIS — E782 Mixed hyperlipidemia: Secondary | ICD-10-CM

## 2018-10-14 DIAGNOSIS — Z125 Encounter for screening for malignant neoplasm of prostate: Secondary | ICD-10-CM

## 2018-10-14 DIAGNOSIS — Z0001 Encounter for general adult medical examination with abnormal findings: Secondary | ICD-10-CM

## 2018-10-14 DIAGNOSIS — Z79899 Other long term (current) drug therapy: Secondary | ICD-10-CM | POA: Diagnosis not present

## 2018-10-14 DIAGNOSIS — Z1389 Encounter for screening for other disorder: Secondary | ICD-10-CM

## 2018-10-14 DIAGNOSIS — Z136 Encounter for screening for cardiovascular disorders: Secondary | ICD-10-CM | POA: Diagnosis not present

## 2018-10-14 DIAGNOSIS — Z1211 Encounter for screening for malignant neoplasm of colon: Secondary | ICD-10-CM

## 2018-10-14 DIAGNOSIS — I700 Atherosclerosis of aorta: Secondary | ICD-10-CM

## 2018-10-14 DIAGNOSIS — Z1212 Encounter for screening for malignant neoplasm of rectum: Secondary | ICD-10-CM

## 2018-10-14 DIAGNOSIS — M1A00X0 Idiopathic chronic gout, unspecified site, without tophus (tophi): Secondary | ICD-10-CM

## 2018-10-14 DIAGNOSIS — Z0000 Encounter for general adult medical examination without abnormal findings: Secondary | ICD-10-CM | POA: Diagnosis not present

## 2018-10-14 DIAGNOSIS — Z6835 Body mass index (BMI) 35.0-35.9, adult: Secondary | ICD-10-CM

## 2018-10-14 DIAGNOSIS — Z111 Encounter for screening for respiratory tuberculosis: Secondary | ICD-10-CM

## 2018-10-14 DIAGNOSIS — N182 Chronic kidney disease, stage 2 (mild): Secondary | ICD-10-CM

## 2018-10-14 DIAGNOSIS — K219 Gastro-esophageal reflux disease without esophagitis: Secondary | ICD-10-CM

## 2018-10-14 DIAGNOSIS — R5383 Other fatigue: Secondary | ICD-10-CM

## 2018-10-14 DIAGNOSIS — N138 Other obstructive and reflux uropathy: Secondary | ICD-10-CM

## 2018-10-15 LAB — CBC WITH DIFFERENTIAL/PLATELET
Absolute Monocytes: 482 cells/uL (ref 200–950)
Basophils Absolute: 73 cells/uL (ref 0–200)
Basophils Relative: 1.1 %
Eosinophils Absolute: 205 cells/uL (ref 15–500)
Eosinophils Relative: 3.1 %
HCT: 43.6 % (ref 38.5–50.0)
Hemoglobin: 14.6 g/dL (ref 13.2–17.1)
Lymphs Abs: 1914 cells/uL (ref 850–3900)
MCH: 27.4 pg (ref 27.0–33.0)
MCHC: 33.5 g/dL (ref 32.0–36.0)
MCV: 81.8 fL (ref 80.0–100.0)
MPV: 9.3 fL (ref 7.5–12.5)
Monocytes Relative: 7.3 %
Neutro Abs: 3927 cells/uL (ref 1500–7800)
Neutrophils Relative %: 59.5 %
Platelets: 212 10*3/uL (ref 140–400)
RBC: 5.33 10*6/uL (ref 4.20–5.80)
RDW: 13.9 % (ref 11.0–15.0)
Total Lymphocyte: 29 %
WBC: 6.6 10*3/uL (ref 3.8–10.8)

## 2018-10-15 LAB — VITAMIN B12: Vitamin B-12: 671 pg/mL (ref 200–1100)

## 2018-10-15 LAB — URINALYSIS, ROUTINE W REFLEX MICROSCOPIC
Bilirubin Urine: NEGATIVE
Glucose, UA: NEGATIVE
Hgb urine dipstick: NEGATIVE
Ketones, ur: NEGATIVE
Leukocytes,Ua: NEGATIVE
Nitrite: NEGATIVE
Protein, ur: NEGATIVE
Specific Gravity, Urine: 1.021 (ref 1.001–1.03)
pH: <=5 (ref 5.0–8.0)

## 2018-10-15 LAB — PSA: PSA: 0.5 ng/mL (ref <=4.0)

## 2018-10-15 LAB — HEMOGLOBIN A1C
Hgb A1c MFr Bld: 8.4 % of total Hgb — ABNORMAL HIGH (ref <5.7)
Mean Plasma Glucose: 194 (calc)
eAG (mmol/L): 10.8 (calc)

## 2018-10-15 LAB — TSH: TSH: 2.26 mIU/L (ref 0.40–4.50)

## 2018-10-15 LAB — COMPLETE METABOLIC PANEL WITH GFR
AG Ratio: 1.8 (calc) (ref 1.0–2.5)
ALT: 16 U/L (ref 9–46)
AST: 15 U/L (ref 10–35)
Albumin: 4.1 g/dL (ref 3.6–5.1)
Alkaline phosphatase (APISO): 72 U/L (ref 35–144)
BUN/Creatinine Ratio: 20 (calc) (ref 6–22)
BUN: 25 mg/dL (ref 7–25)
CO2: 29 mmol/L (ref 20–32)
Calcium: 9.9 mg/dL (ref 8.6–10.3)
Chloride: 100 mmol/L (ref 98–110)
Creat: 1.23 mg/dL — ABNORMAL HIGH (ref 0.70–1.18)
GFR, Est African American: 68 mL/min/{1.73_m2} (ref >=60)
GFR, Est Non African American: 59 mL/min/{1.73_m2} — ABNORMAL LOW (ref >=60)
Globulin: 2.3 g/dL (calc) (ref 1.9–3.7)
Glucose, Bld: 183 mg/dL — ABNORMAL HIGH (ref 65–99)
Potassium: 4.2 mmol/L (ref 3.5–5.3)
Sodium: 138 mmol/L (ref 135–146)
Total Bilirubin: 0.6 mg/dL (ref 0.2–1.2)
Total Protein: 6.4 g/dL (ref 6.1–8.1)

## 2018-10-15 LAB — LIPID PANEL
Cholesterol: 171 mg/dL (ref <200)
HDL: 32 mg/dL — ABNORMAL LOW (ref >=40)
LDL Cholesterol (Calc): 96 mg/dL (calc)
Non-HDL Cholesterol (Calc): 139 mg/dL (calc) — ABNORMAL HIGH (ref <130)
Total CHOL/HDL Ratio: 5.3 (calc) — ABNORMAL HIGH (ref <5.0)
Triglycerides: 319 mg/dL — ABNORMAL HIGH (ref <150)

## 2018-10-15 LAB — MAGNESIUM: Magnesium: 1.6 mg/dL (ref 1.5–2.5)

## 2018-10-15 LAB — MICROALBUMIN / CREATININE URINE RATIO
Creatinine, Urine: 123 mg/dL (ref 20–320)
Microalb Creat Ratio: 16 mcg/mg creat (ref <30)
Microalb, Ur: 2 mg/dL

## 2018-10-15 LAB — URIC ACID: Uric Acid, Serum: 7 mg/dL (ref 4.0–8.0)

## 2018-10-15 LAB — VITAMIN D 25 HYDROXY (VIT D DEFICIENCY, FRACTURES): Vit D, 25-Hydroxy: 43 ng/mL (ref 30–100)

## 2018-10-15 LAB — IRON, TOTAL/TOTAL IRON BINDING CAP
%SAT: 27 % (calc) (ref 20–48)
Iron: 86 ug/dL (ref 50–180)
TIBC: 324 mcg/dL (calc) (ref 250–425)

## 2018-10-15 LAB — INSULIN, RANDOM: Insulin: 20.4 u[IU]/mL — ABNORMAL HIGH

## 2018-10-24 ENCOUNTER — Other Ambulatory Visit: Payer: Self-pay | Admitting: Internal Medicine

## 2018-10-24 NOTE — Telephone Encounter (Signed)
Take 1 capsule Daily for Acid Indigestion

## 2018-10-28 ENCOUNTER — Other Ambulatory Visit: Payer: Self-pay | Admitting: Internal Medicine

## 2018-10-28 DIAGNOSIS — K219 Gastro-esophageal reflux disease without esophagitis: Secondary | ICD-10-CM

## 2018-10-28 MED ORDER — FAMOTIDINE 40 MG PO TABS: ORAL_TABLET | ORAL | 3 refills | Status: DC

## 2018-11-13 ENCOUNTER — Other Ambulatory Visit: Payer: Self-pay | Admitting: Internal Medicine

## 2018-12-05 ENCOUNTER — Telehealth: Payer: Self-pay | Admitting: *Deleted

## 2018-12-05 NOTE — Telephone Encounter (Signed)
The patient's 10/14/2018 PSA result was mailed to the patient, at his request. He will submit to his insurance company.

## 2018-12-11 ENCOUNTER — Telehealth: Payer: Self-pay | Admitting: *Deleted

## 2018-12-11 NOTE — Telephone Encounter (Signed)
A copy of the patient's 10/14/2018 PSA remailed to the patient.  Patient's spouse states they did not receive the copy mailed on 12/05/2018.

## 2019-01-05 ENCOUNTER — Other Ambulatory Visit: Payer: Self-pay | Admitting: Adult Health

## 2019-01-05 DIAGNOSIS — N182 Chronic kidney disease, stage 2 (mild): Secondary | ICD-10-CM

## 2019-01-05 DIAGNOSIS — E1122 Type 2 diabetes mellitus with diabetic chronic kidney disease: Secondary | ICD-10-CM

## 2019-01-20 ENCOUNTER — Ambulatory Visit: Payer: Managed Care, Other (non HMO) | Admitting: Adult Health

## 2019-02-06 ENCOUNTER — Other Ambulatory Visit: Payer: Self-pay

## 2019-02-06 ENCOUNTER — Telehealth: Payer: Self-pay | Admitting: *Deleted

## 2019-02-06 ENCOUNTER — Encounter: Payer: Self-pay | Admitting: Adult Health

## 2019-02-06 ENCOUNTER — Ambulatory Visit: Payer: Managed Care, Other (non HMO) | Admitting: Adult Health

## 2019-02-06 ENCOUNTER — Other Ambulatory Visit: Payer: Self-pay | Admitting: Internal Medicine

## 2019-02-06 VITALS — Temp 99.0°F

## 2019-02-06 DIAGNOSIS — Z20822 Contact with and (suspected) exposure to covid-19: Secondary | ICD-10-CM

## 2019-02-06 DIAGNOSIS — B9789 Other viral agents as the cause of diseases classified elsewhere: Secondary | ICD-10-CM | POA: Diagnosis not present

## 2019-02-06 DIAGNOSIS — J069 Acute upper respiratory infection, unspecified: Secondary | ICD-10-CM | POA: Diagnosis not present

## 2019-02-06 NOTE — Progress Notes (Signed)
Virtual Visit via Telephone Note  I connected with Phillip Powell on 02/06/19 at 10:30 AM EDT by telephone and verified that I am speaking with the correct person using two identifiers.  Location: Patient: home  Provider: [place] office   I discussed the limitations, risks, security and privacy concerns of performing an evaluation and management service by telephone and the availability of in person appointments. I also discussed with the patient that there may be a patient responsible charge related to this service. The patient expressed understanding and agreed to proceed.   History of Present Illness:  Temp 54 F (37.2 C)   73 y.o. male patient with hx of htn, T2DM, sarcoidosis calls to discuss URI sx;   He reports started coughing, feeling weak, "just lousy" about 7/11, was out camping with his wife and family for about a week at that point (was isolated), wife and daughter are having similar symptoms.   Denies CP other than soreness from coughing excessive; reports minimally productive, feels like triggered by tickle in throat, overall feeling somewhat better since yesterday and today (about 5 days into symptoms).   Denies HA, blurry vision, dizziness, GI sx; he does endorse had chills, was checking temp and never have a true fever, low grade temp today.   Denies sore throat, nasal congestion, runny nose.   Taking OTC cough medication, has improved with medication. Has been taking tylenol as he usually does for arthritic pain. Denies atypical arthralgias or myalgias. Denies rashes.    Allergies:  Allergies  Allergen Reactions  . Ace Inhibitors Other (See Comments)    Unknown  . Ibuprofen Other (See Comments)    GI upset  . Invokana [Canagliflozin] Other (See Comments)    Unknown  . Metformin And Related Diarrhea  . Quinidine Other (See Comments)    Unknown   Medical History:  has Hypertension; Type 2 diabetes mellitus ([place]); GERD (gastroesophageal reflux disease);  Testosterone Deficiency; OSA (obstructive sleep apnea); Vitamin D deficiency; BPH (benign prostatic hypertrophy); Hyperlipidemia associated with type 2 diabetes mellitus ([place]); Medication management; Obesity (BMI 30.0-34.9); Poor compliance with Diet ; Gluttony; and CKD stage 3 due to type 2 diabetes mellitus ([place]) on their problem list.   Social History:   reports that he has never smoked. He has never used smokeless tobacco. He reports current alcohol use. He reports that he does not use drugs.    Observations/Objective:  General : Well sounding patient in no apparent distress HEENT: no hoarseness, no cough for duration of visit Lungs: speaks in complete sentences, no audible wheezing, no apparent distress Neurological: alert, oriented x 3 Psychiatric: pleasant, judgement appropriate    Assessment and Plan:  Salvadore was seen today for cough and fatigue.  Diagnoses and all orders for this visit:  Viral URI with cough Viral URI; cannot exclude covid 19 Recommended drive through testing at [facility], instructions given Symptoms are improving, mild; continue supportive care; hydration, OTC cough medication is effective, tylenol PRN pain/fever above 101, vitamin D, zinc Have advised he and family who was exposed should quarantine until results are back OR  To stop home isolation, you will need all three of the statements below.  1) You need to be fever free for 3 days WITHOUT tylenol.  2) Your symptoms such as cough, shortness of breath need to improve.  3) It needs to be at least 10 days since your symptoms first appeared    After you stop home isolation, please continue to wear a  mask in public and continue hand washing and contact precautions.   Advised to present to ED for uncontrollable fever or respiratory distress Follow up with any new symptoms or concerns in the interim    Follow Up Instructions:    I discussed the assessment and treatment plan with the  patient. The patient was provided an opportunity to ask questions and all were answered. The patient agreed with the plan and demonstrated an understanding of the instructions.   The patient was advised to call back or seek an in-person evaluation if the symptoms worsen or if the condition fails to improve as anticipated.  I provided 15 minutes of non-face-to-face time during this encounter.   Izora Ribas, NP

## 2019-02-06 NOTE — Telephone Encounter (Signed)
Spouse called and reported the patient has a cough, weakness, slight fever and headache.  The patient will have a telephone visit with Liane Comber, NP and discuss symptoms.

## 2019-02-07 LAB — NOVEL CORONAVIRUS, NAA: SARS-CoV-2, NAA: DETECTED — AB

## 2019-02-08 ENCOUNTER — Other Ambulatory Visit: Payer: Self-pay | Admitting: Internal Medicine

## 2019-02-08 MED ORDER — ONDANSETRON HCL 8 MG PO TABS: ORAL_TABLET | ORAL | 1 refills | Status: DC

## 2019-02-10 ENCOUNTER — Telehealth: Payer: Self-pay | Admitting: Internal Medicine

## 2019-02-10 NOTE — Telephone Encounter (Signed)
Results mailed to home address

## 2019-02-10 NOTE — Telephone Encounter (Signed)
Copied from [place] [id number]. Topic: General - Other >> Feb 10, 2019 10:45 AM Rainey Pines A wrote: Patients wife stated that patients would like covid test results mailed to their home for patients employer.

## 2019-02-11 ENCOUNTER — Emergency Department (HOSPITAL_COMMUNITY): Payer: Managed Care, Other (non HMO)

## 2019-02-11 ENCOUNTER — Other Ambulatory Visit: Payer: Self-pay | Admitting: Internal Medicine

## 2019-02-11 ENCOUNTER — Inpatient Hospital Stay (HOSPITAL_COMMUNITY)
Admission: EM | Admit: 2019-02-11 | Discharge: 2019-02-13 | DRG: 178 | Disposition: A | Discharge status: Home / Self Care | Payer: Managed Care, Other (non HMO) | Attending: Family Medicine | Admitting: Family Medicine

## 2019-02-11 ENCOUNTER — Other Ambulatory Visit: Payer: Self-pay

## 2019-02-11 ENCOUNTER — Encounter (HOSPITAL_COMMUNITY): Payer: Self-pay | Admitting: Emergency Medicine

## 2019-02-11 DIAGNOSIS — Z833 Family history of diabetes mellitus: Secondary | ICD-10-CM

## 2019-02-11 DIAGNOSIS — U071 COVID-19: Secondary | ICD-10-CM

## 2019-02-11 DIAGNOSIS — Z8349 Family history of other endocrine, nutritional and metabolic diseases: Secondary | ICD-10-CM

## 2019-02-11 DIAGNOSIS — I129 Hypertensive chronic kidney disease with stage 1 through stage 4 chronic kidney disease, or unspecified chronic kidney disease: Secondary | ICD-10-CM | POA: Diagnosis present

## 2019-02-11 DIAGNOSIS — Z809 Family history of malignant neoplasm, unspecified: Secondary | ICD-10-CM

## 2019-02-11 DIAGNOSIS — K219 Gastro-esophageal reflux disease without esophagitis: Secondary | ICD-10-CM | POA: Diagnosis present

## 2019-02-11 DIAGNOSIS — E860 Dehydration: Secondary | ICD-10-CM | POA: Diagnosis present

## 2019-02-11 DIAGNOSIS — I10 Essential (primary) hypertension: Secondary | ICD-10-CM | POA: Diagnosis present

## 2019-02-11 DIAGNOSIS — E119 Type 2 diabetes mellitus without complications: Secondary | ICD-10-CM

## 2019-02-11 DIAGNOSIS — E1169 Type 2 diabetes mellitus with other specified complication: Secondary | ICD-10-CM | POA: Diagnosis present

## 2019-02-11 DIAGNOSIS — E785 Hyperlipidemia, unspecified: Secondary | ICD-10-CM | POA: Diagnosis present

## 2019-02-11 DIAGNOSIS — N400 Enlarged prostate without lower urinary tract symptoms: Secondary | ICD-10-CM | POA: Diagnosis present

## 2019-02-11 DIAGNOSIS — N179 Acute kidney failure, unspecified: Secondary | ICD-10-CM | POA: Diagnosis present

## 2019-02-11 DIAGNOSIS — T380X5A Adverse effect of glucocorticoids and synthetic analogues, initial encounter: Secondary | ICD-10-CM | POA: Diagnosis present

## 2019-02-11 DIAGNOSIS — N183 Chronic kidney disease, stage 3 unspecified: Secondary | ICD-10-CM | POA: Diagnosis present

## 2019-02-11 DIAGNOSIS — E1165 Type 2 diabetes mellitus with hyperglycemia: Secondary | ICD-10-CM

## 2019-02-11 DIAGNOSIS — R112 Nausea with vomiting, unspecified: Secondary | ICD-10-CM | POA: Diagnosis present

## 2019-02-11 DIAGNOSIS — Z79899 Other long term (current) drug therapy: Secondary | ICD-10-CM

## 2019-02-11 DIAGNOSIS — Z7982 Long term (current) use of aspirin: Secondary | ICD-10-CM

## 2019-02-11 DIAGNOSIS — R197 Diarrhea, unspecified: Secondary | ICD-10-CM | POA: Diagnosis present

## 2019-02-11 DIAGNOSIS — G4733 Obstructive sleep apnea (adult) (pediatric): Secondary | ICD-10-CM | POA: Diagnosis present

## 2019-02-11 DIAGNOSIS — E1122 Type 2 diabetes mellitus with diabetic chronic kidney disease: Secondary | ICD-10-CM | POA: Diagnosis present

## 2019-02-11 HISTORY — DX: COVID-19: U07.1

## 2019-02-11 LAB — CBC WITH DIFFERENTIAL/PLATELET
Abs Immature Granulocytes: 0.03 10*3/uL (ref 0.00–0.07)
Basophils Absolute: 0 10*3/uL (ref 0.0–0.1)
Basophils Relative: 0 %
Eosinophils Absolute: 0 10*3/uL (ref 0.0–0.5)
Eosinophils Relative: 0 %
HCT: 41.4 % (ref 39.0–52.0)
Hemoglobin: 13.7 g/dL (ref 13.0–17.0)
Immature Granulocytes: 1 %
Lymphocytes Relative: 20 %
Lymphs Abs: 1.2 10*3/uL (ref 0.7–4.0)
MCH: 27.3 pg (ref 26.0–34.0)
MCHC: 33.1 g/dL (ref 30.0–36.0)
MCV: 82.5 fL (ref 80.0–100.0)
Monocytes Absolute: 0.5 10*3/uL (ref 0.1–1.0)
Monocytes Relative: 8 %
Neutro Abs: 4.4 10*3/uL (ref 1.7–7.7)
Neutrophils Relative %: 71 %
Platelets: 188 10*3/uL (ref 150–400)
RBC: 5.02 MIL/uL (ref 4.22–5.81)
RDW: 13.4 % (ref 11.5–15.5)
WBC: 6.2 10*3/uL (ref 4.0–10.5)
nRBC: 0 % (ref 0.0–0.2)

## 2019-02-11 MED ORDER — SODIUM CHLORIDE 0.9 % IV BOLUS
1000.0000 mL | Freq: Once | INTRAVENOUS | Status: AC
  Administered 2019-02-11: 1000 mL via INTRAVENOUS

## 2019-02-11 MED ORDER — ONDANSETRON HCL 4 MG/2ML IJ SOLN
4.0000 mg | Freq: Once | INTRAMUSCULAR | Status: AC
  Administered 2019-02-11: 4 mg via INTRAVENOUS
  Filled 2019-02-11: qty 2

## 2019-02-11 MED ORDER — HYDROCOD POLST-CPM POLST ER 10-8 MG/5ML PO SUER
5.0000 mL | Freq: Once | ORAL | Status: AC
  Administered 2019-02-11: 5 mL via ORAL
  Filled 2019-02-11: qty 5

## 2019-02-11 NOTE — ED Notes (Signed)
At 2110 pt O2 sat 90-92%, applied 0.5 L [place]. Pt O2 rose to 95%. Increased O2 to 1 L.

## 2019-02-11 NOTE — ED Provider Notes (Signed)
[facility] DEPT Provider Note   CSN: [id number] Arrival date & time: 02/11/19  2056    History   Chief Complaint Chief Complaint  Patient presents with  . Diarrhea  . Cough    HPI Phillip Powell is a 73 y.o. male.     73 yo M with chief complaints of cough congestion fevers chills vomiting and diarrhea.  Going on for the past few days.  Patient was recently diagnosed with the novel coronavirus.  His wife unfortunately is also sick with a sinus already in the hospital.  He states for the last few days he does not like the way that things taste and has had difficulty tolerating anything by mouth.  He denies shortness of breath.  States that he feels very weak.  The history is provided by the patient.  Diarrhea Associated symptoms: vomiting   Associated symptoms: no abdominal pain, no arthralgias, no chills, no fever, no headaches and no myalgias   Cough Associated symptoms: no chest pain, no chills, no eye discharge, no fever, no headaches, no myalgias, no rash and no shortness of breath   Illness Severity:  Moderate Onset quality:  Gradual Duration:  2 days Timing:  Constant Progression:  Worsening Chronicity:  New Associated symptoms: cough, diarrhea, nausea and vomiting   Associated symptoms: no abdominal pain, no chest pain, no congestion, no fever, no headaches, no myalgias, no rash and no shortness of breath     Past Medical History:  Diagnosis Date  . BPH (benign prostatic hypertrophy)   . Diabetes mellitus without complication ([place])   . GERD (gastroesophageal reflux disease)   . Headache    PMH  . Hypertension   . Hypogonadism male   . Mass    left shoulder AC joint  . OSA (obstructive sleep apnea)    wears CPAP  . PONV (postoperative nausea and vomiting)   . Sarcoidosis   . Vitamin D deficiency     Patient Active Problem List   Diagnosis Date Noted  .  Covid-19 Positive (02/06/2019)  02/11/2019  . CKD stage 3 due to  type 2 diabetes mellitus ([place]) 06/10/2017  . Gluttony 11/26/2016  . Poor compliance with Diet  10/05/2014  . Obesity (BMI 30.0-34.9) 03/02/2014  . Hyperlipidemia associated with type 2 diabetes mellitus ([place]) 08/18/2013  . Medication management 08/18/2013  . Hypertension   . Type 2 diabetes mellitus ([place])   . GERD (gastroesophageal reflux disease)   . Testosterone Deficiency   . OSA (obstructive sleep apnea)   . Vitamin D deficiency   . BPH (benign prostatic hypertrophy)     Past Surgical History:  Procedure Laterality Date  . APPENDECTOMY    . COLONOSCOPY    . HAND SURGERY Right 2019   Dr. Amedeo Plenty, tendon repair of R thumb  . HERNIA REPAIR  [id number]   umbilical  . KNEE ARTHROSCOPY    . ORIF CLAVICULAR FRACTURE Left 03/29/2018   Procedure: LEFT SHOULDER OPEN DISTAL CLAVICLE RESECTION, CYST EXCISION;  Surgeon: Netta Cedars, MD;  Location: [facility];  Service: Orthopedics;  Laterality: Left;  . ROTATOR CUFF REPAIR Left 1996  . ROTATOR CUFF REPAIR Right 1997  . TONSILLECTOMY  1952        Home Medications    Prior to Admission medications   Medication Sig Start Date End Date Taking? Authorizing Provider  aspirin EC 81 MG tablet Take 81 mg by mouth daily.    [provider]  atenolol (TENORMIN) 100 MG  tablet Take 1 tablet Daily for BP 11/13/18   Unk Pinto, MD  B-D 3CC LUER-LOK SYR 21GX1" 21G X 1" 3 ML MISC use as directed 10/17/16   Unk Pinto, MD  Blood Glucose Monitoring Suppl DEVI Use meter three times a day to check blood sugar 05/20/18   Liane Comber, NP  Cholecalciferol (VITAMIN D3) 2000 units TABS Take 6,000 Units by mouth daily.     [provider]  Cinnamon 500 MG capsule Take 2,000 mg by mouth daily.     [provider]  famotidine (PEPCID) 40 MG tablet Take 1 tablet at bedtime for Acid Reflux Patient not taking: Reported on 02/06/2019 10/28/18   Unk Pinto, MD  glucose blood test strip Test blood sugar three times a day 05/20/18    Liane Comber, NP  glyBURIDE (DIABETA) 5 MG tablet Take 1/2 to 1 tablet 3 x /day before meals as directed for Diabetes 06/12/18   Unk Pinto, MD  hydrochlorothiazide (HYDRODIURIL) 25 MG tablet Take 1 Tablet Daily for BP & Fluid 10/24/18   Unk Pinto, MD  Lancets MISC Check blood sugars up to three times daily 05/20/18   Liane Comber, NP  losartan (COZAAR) 100 MG tablet Take 1 tablet Daily for BP 11/13/18   Unk Pinto, MD  Magnesium 500 MG TABS Take 0.5 tablets (250 mg total) by mouth 3 (three) times daily. Patient taking differently: Take 500 mg by mouth daily.  05/21/18   Liane Comber, NP  meclizine (ANTIVERT) 25 MG tablet Take 25 mg by mouth 3 (three) times daily as needed for dizziness.    [provider]  meloxicam (MOBIC) 15 MG tablet Take 1/2 to 1 tablet daily with food for pain & Inflammation 07/10/18   Unk Pinto, MD  ondansetron [facility]) 8 MG tablet Take 1/2 to 1 tablet 3 x day if needed for Nausea 02/08/19   Unk Pinto, MD  sitaGLIPtin-metformin (JANUMET) 50-500 MG tablet Take 1 tablet 2 x /day with a meal for Diabetes 01/05/19   Unk Pinto, MD  tamsulosin (FLOMAX) 0.4 MG CAPS capsule TAKE 1 CAPSULE(0.4 MG) BY MOUTH DAILY 09/11/18   Unk Pinto, MD  TURMERIC PO Take 1 capsule by mouth daily.    [provider]  vitamin C (ASCORBIC ACID) 500 MG tablet Take 500 mg by mouth 2 (two) times daily.    [provider]  zinc gluconate 50 MG tablet Take 50 mg by mouth daily.    [provider]    Family History Family History  Problem Relation Age of Onset  . Thyroid disease Father   . Cancer Father        lung  . Cancer Mother        throat  . Diabetes Mother   . Thyroid disease Sister   . Hypertension Son   . Hyperlipidemia Son   . Cancer Paternal Grandmother        colon    Social History Social History   Tobacco Use  . Smoking status: Never Smoker  . Smokeless tobacco: Never Used  Substance Use  Topics  . Alcohol use: Yes    Comment: very rarely  . Drug use: Never     Allergies   Ace inhibitors, Ibuprofen, Invokana [canagliflozin], Metformin and related, and Quinidine   Review of Systems Review of Systems  Constitutional: Negative for chills and fever.  HENT: Negative for congestion and facial swelling.   Eyes: Negative for discharge and visual disturbance.  Respiratory: Positive for cough.  Negative for shortness of breath.   Cardiovascular: Negative for chest pain and palpitations.  Gastrointestinal: Positive for diarrhea, nausea and vomiting. Negative for abdominal pain.  Musculoskeletal: Negative for arthralgias and myalgias.  Skin: Negative for color change and rash.  Neurological: Negative for tremors, syncope and headaches.  Psychiatric/Behavioral: Negative for confusion and dysphoric mood.     Physical Exam Updated Vital Signs BP (!) 147/83 (BP Location: Right Arm)   Pulse 76   Temp 99.5 F (37.5 C) (Oral)   Resp 20   Ht 5\' 9"  (1.753 m)   Wt 104.3 kg   SpO2 96%   BMI 33.97 kg/m   Physical Exam Vitals signs and nursing note reviewed.  Constitutional:      Appearance: He is well-developed.  HENT:     Head: Normocephalic and atraumatic.  Eyes:     Pupils: Pupils are equal, round, and reactive to light.  Neck:     Musculoskeletal: Normal range of motion and neck supple.     Vascular: No JVD.  Cardiovascular:     Rate and Rhythm: Normal rate and regular rhythm.     Heart sounds: No murmur. No friction rub. No gallop.   Pulmonary:     Effort: No respiratory distress.     Breath sounds: No wheezing.  Abdominal:     General: There is no distension.     Tenderness: There is no abdominal tenderness. There is no guarding or rebound.  Musculoskeletal: Normal range of motion.  Skin:    Coloration: Skin is not pale.     Findings: No rash.  Neurological:     Mental Status: He is alert and oriented to person, place, and time.  Psychiatric:         Behavior: Behavior normal.      ED Treatments / Results  Labs (all labs ordered are listed, but only abnormal results are displayed) Labs Reviewed  CBC WITH DIFFERENTIAL/PLATELET  COMPREHENSIVE METABOLIC PANEL  LIPASE, BLOOD  MAGNESIUM    EKG EKG Interpretation  Date/Time:  Tuesday February 11 2019 21:01:08 EDT Ventricular Rate:  74 PR Interval:    QRS Duration: 105 QT Interval:  383 QTC Calculation: 425 R Axis:   42 Text Interpretation:  Sinus rhythm Low voltage, precordial leads Abnormal R-wave progression, early transition No old tracing to compare Confirmed by Deno Etienne [phone]) on 02/11/2019 9:48:51 PM   Radiology Dg Chest Portable 1 View  Result Date: 02/11/2019 CLINICAL DATA:  Cough and fever, positive COVID-19 testing EXAM: PORTABLE CHEST 1 VIEW COMPARISON:  08/19/2012 FINDINGS: Cardiac shadow is within normal limits. The lungs are well aerated bilaterally. Some minimal atelectatic changes are noted in the bases. No focal confluent infiltrate is seen. Aortic calcifications are noted. No bony abnormality is seen. IMPRESSION: Minimal bibasilar atelectasis. Electronically Signed   By: Inez Catalina M.D.   On: 02/11/2019 21:43    Procedures Procedures (including critical care time)  Medications Ordered in ED Medications  sodium chloride 0.9 % bolus 1,000 mL (has no administration in time range)  chlorpheniramine-HYDROcodone (TUSSIONEX) 10-8 MG/5ML suspension 5 mL (has no administration in time range)  ondansetron (ZOFRAN) injection 4 mg (has no administration in time range)     Initial Impression / Assessment and Plan / ED Course  I have reviewed the triage vital signs and the nursing notes.  Pertinent labs & imaging results that were available during my care of the patient were reviewed by me and considered in my medical decision making (see chart  for details).        73 yo M with a chief complaints of nausea vomiting and diarrhea.  Having decreased oral intake  and unable to keep anything down for the past 48 hours.  Patient was recently diagnosed with the novel coronavirus.  Not requiring oxygen on arrival.  Will obtain a laboratory evaluation give a bolus of IV fluids Zofran and reassess.  Awaiting IV start fluids and lab work.  Patient signed out to Dr. Wyvonnia Dusky please see his note for further details care in the ED.  The patients results and plan were reviewed and discussed.   Any x-rays performed were independently reviewed by myself.   Differential diagnosis were considered with the presenting HPI.  Medications  sodium chloride 0.9 % bolus 1,000 mL (has no administration in time range)  chlorpheniramine-HYDROcodone (TUSSIONEX) 10-8 MG/5ML suspension 5 mL (has no administration in time range)  ondansetron (ZOFRAN) injection 4 mg (has no administration in time range)    Vitals:   02/11/19 2110 02/11/19 2115 02/11/19 2116 02/11/19 2120  BP:      Pulse: 73 76    Resp: 16 20    Temp:      TempSrc:      SpO2: 91% 91%  96%  Weight:   104.3 kg   Height:   5\' 9"  (1.753 m)     Final diagnoses:  COVID-19       Final Clinical Impressions(s) / ED Diagnoses   Final diagnoses:  COVID-19    ED Discharge Orders    None       Deno Etienne, DO 02/11/19 2317

## 2019-02-11 NOTE — ED Triage Notes (Signed)
[facility] transported pt from home and reports the following:  Pt son insisted that pt come to hospital. On 02/08/19 pt tested COVID positive. Currently, his spouse is at [facility] with [facility]. Pt has a cough, diarrhea, loss of appetite. SOB while ambulating O2 at 93%. EMS applied 2L O2 and his O2 rose to 97%. Temp 97.9.  Ambulates independently.

## 2019-02-12 ENCOUNTER — Encounter (HOSPITAL_COMMUNITY): Payer: Self-pay | Admitting: Internal Medicine

## 2019-02-12 DIAGNOSIS — E860 Dehydration: Secondary | ICD-10-CM | POA: Diagnosis not present

## 2019-02-12 DIAGNOSIS — U071 COVID-19: Principal | ICD-10-CM

## 2019-02-12 DIAGNOSIS — Z7982 Long term (current) use of aspirin: Secondary | ICD-10-CM | POA: Diagnosis not present

## 2019-02-12 DIAGNOSIS — R112 Nausea with vomiting, unspecified: Secondary | ICD-10-CM | POA: Diagnosis not present

## 2019-02-12 DIAGNOSIS — E1165 Type 2 diabetes mellitus with hyperglycemia: Secondary | ICD-10-CM | POA: Diagnosis not present

## 2019-02-12 DIAGNOSIS — G4733 Obstructive sleep apnea (adult) (pediatric): Secondary | ICD-10-CM | POA: Diagnosis not present

## 2019-02-12 DIAGNOSIS — N183 Chronic kidney disease, stage 3 (moderate): Secondary | ICD-10-CM

## 2019-02-12 DIAGNOSIS — Z809 Family history of malignant neoplasm, unspecified: Secondary | ICD-10-CM | POA: Diagnosis not present

## 2019-02-12 DIAGNOSIS — I129 Hypertensive chronic kidney disease with stage 1 through stage 4 chronic kidney disease, or unspecified chronic kidney disease: Secondary | ICD-10-CM | POA: Diagnosis not present

## 2019-02-12 DIAGNOSIS — N400 Enlarged prostate without lower urinary tract symptoms: Secondary | ICD-10-CM | POA: Diagnosis not present

## 2019-02-12 DIAGNOSIS — Z8349 Family history of other endocrine, nutritional and metabolic diseases: Secondary | ICD-10-CM | POA: Diagnosis not present

## 2019-02-12 DIAGNOSIS — K219 Gastro-esophageal reflux disease without esophagitis: Secondary | ICD-10-CM | POA: Diagnosis not present

## 2019-02-12 DIAGNOSIS — T380X5A Adverse effect of glucocorticoids and synthetic analogues, initial encounter: Secondary | ICD-10-CM | POA: Diagnosis present

## 2019-02-12 DIAGNOSIS — N179 Acute kidney failure, unspecified: Secondary | ICD-10-CM | POA: Diagnosis present

## 2019-02-12 DIAGNOSIS — E1169 Type 2 diabetes mellitus with other specified complication: Secondary | ICD-10-CM | POA: Diagnosis present

## 2019-02-12 DIAGNOSIS — R197 Diarrhea, unspecified: Secondary | ICD-10-CM | POA: Diagnosis present

## 2019-02-12 DIAGNOSIS — E1122 Type 2 diabetes mellitus with diabetic chronic kidney disease: Secondary | ICD-10-CM

## 2019-02-12 DIAGNOSIS — Z79899 Other long term (current) drug therapy: Secondary | ICD-10-CM | POA: Diagnosis not present

## 2019-02-12 DIAGNOSIS — Z833 Family history of diabetes mellitus: Secondary | ICD-10-CM | POA: Diagnosis not present

## 2019-02-12 DIAGNOSIS — E785 Hyperlipidemia, unspecified: Secondary | ICD-10-CM | POA: Diagnosis not present

## 2019-02-12 LAB — PROCALCITONIN: Procalcitonin: <0.1 ng/mL

## 2019-02-12 LAB — COMPREHENSIVE METABOLIC PANEL
ALT: 26 U/L (ref 0–44)
AST: 30 U/L (ref 15–41)
Albumin: 3.4 g/dL — ABNORMAL LOW (ref 3.5–5.0)
Alkaline Phosphatase: 55 U/L (ref 38–126)
Anion gap: 12 (ref 5–15)
BUN: 33 mg/dL — ABNORMAL HIGH (ref 8–23)
CO2: 27 mmol/L (ref 22–32)
Calcium: 8.5 mg/dL — ABNORMAL LOW (ref 8.9–10.3)
Chloride: 95 mmol/L — ABNORMAL LOW (ref 98–111)
Creatinine, Ser: 1.75 mg/dL — ABNORMAL HIGH (ref 0.61–1.24)
GFR calc Af Amer: 44 mL/min — ABNORMAL LOW (ref >60)
GFR calc non Af Amer: 38 mL/min — ABNORMAL LOW (ref >60)
Glucose, Bld: 201 mg/dL — ABNORMAL HIGH (ref 70–99)
Potassium: 3.8 mmol/L (ref 3.5–5.1)
Sodium: 134 mmol/L — ABNORMAL LOW (ref 135–145)
Total Bilirubin: 0.7 mg/dL (ref 0.3–1.2)
Total Protein: 7 g/dL (ref 6.5–8.1)

## 2019-02-12 LAB — GLUCOSE, CAPILLARY
Glucose-Capillary: 185 mg/dL — ABNORMAL HIGH (ref 70–99)
Glucose-Capillary: 266 mg/dL — ABNORMAL HIGH (ref 70–99)

## 2019-02-12 LAB — ABO/RH: ABO/RH(D): A POS

## 2019-02-12 LAB — D-DIMER, QUANTITATIVE: D-Dimer, Quant: 0.64 ug/mL-FEU — ABNORMAL HIGH (ref 0.00–0.50)

## 2019-02-12 LAB — CBG MONITORING, ED: Glucose-Capillary: 190 mg/dL — ABNORMAL HIGH (ref 70–99)

## 2019-02-12 LAB — FERRITIN: Ferritin: 1968 ng/mL — ABNORMAL HIGH (ref 24–336)

## 2019-02-12 LAB — FIBRINOGEN: Fibrinogen: 614 mg/dL — ABNORMAL HIGH (ref 210–475)

## 2019-02-12 LAB — MAGNESIUM: Magnesium: 1.9 mg/dL (ref 1.7–2.4)

## 2019-02-12 LAB — LIPASE, BLOOD: Lipase: 42 U/L (ref 11–51)

## 2019-02-12 LAB — SARS CORONAVIRUS 2 BY RT PCR (HOSPITAL ORDER, PERFORMED IN ~~LOC~~ HOSPITAL LAB): SARS Coronavirus 2: POSITIVE — AB

## 2019-02-12 LAB — LACTATE DEHYDROGENASE: LDH: 189 U/L (ref 98–192)

## 2019-02-12 MED ORDER — ACETAMINOPHEN 325 MG PO TABS: 650.0000 mg | ORAL_TABLET | Freq: Four times a day (QID) | ORAL | Status: DC | PRN

## 2019-02-12 MED ORDER — ONDANSETRON HCL 4 MG PO TABS: 4.0000 mg | ORAL_TABLET | Freq: Four times a day (QID) | ORAL | Status: DC | PRN

## 2019-02-12 MED ORDER — ACETAMINOPHEN 650 MG RE SUPP: 650.0000 mg | Freq: Four times a day (QID) | RECTAL | Status: DC | PRN

## 2019-02-12 MED ORDER — TAMSULOSIN HCL 0.4 MG PO CAPS
0.4000 mg | ORAL_CAPSULE | Freq: Every day | ORAL | Status: DC
  Administered 2019-02-12 – 2019-02-13 (×2): 0.4 mg via ORAL
  Filled 2019-02-12 (×2): qty 1

## 2019-02-12 MED ORDER — ALBUTEROL SULFATE HFA 108 (90 BASE) MCG/ACT IN AERS
2.0000 | INHALATION_SPRAY | Freq: Four times a day (QID) | RESPIRATORY_TRACT | Status: DC | PRN
  Filled 2019-02-12: qty 6.7

## 2019-02-12 MED ORDER — ONDANSETRON HCL 4 MG/2ML IJ SOLN: 4.0000 mg | Freq: Four times a day (QID) | INTRAMUSCULAR | Status: DC | PRN

## 2019-02-12 MED ORDER — ONDANSETRON HCL 4 MG/2ML IJ SOLN
4.0000 mg | Freq: Once | INTRAMUSCULAR | Status: AC
  Administered 2019-02-12: 4 mg via INTRAVENOUS
  Filled 2019-02-12: qty 2

## 2019-02-12 MED ORDER — INSULIN ASPART 100 UNIT/ML ~~LOC~~ SOLN
0.0000 [IU] | Freq: Three times a day (TID) | SUBCUTANEOUS | Status: DC
  Administered 2019-02-12 – 2019-02-13 (×2): 2 [IU] via SUBCUTANEOUS
  Administered 2019-02-13: 5 [IU] via SUBCUTANEOUS
  Filled 2019-02-12: qty 0.09

## 2019-02-12 MED ORDER — SODIUM CHLORIDE 0.9 % IV SOLN
INTRAVENOUS | Status: DC
  Administered 2019-02-12: 09:00:00 via INTRAVENOUS

## 2019-02-12 MED ORDER — INSULIN ASPART 100 UNIT/ML ~~LOC~~ SOLN
0.0000 [IU] | Freq: Every day | SUBCUTANEOUS | Status: DC
  Administered 2019-02-12: 3 [IU] via SUBCUTANEOUS
  Filled 2019-02-12: qty 0.05

## 2019-02-12 MED ORDER — GUAIFENESIN ER 600 MG PO TB12
600.0000 mg | ORAL_TABLET | Freq: Two times a day (BID) | ORAL | Status: DC
  Administered 2019-02-12 – 2019-02-13 (×3): 600 mg via ORAL
  Filled 2019-02-12 (×2): qty 1

## 2019-02-12 MED ORDER — MECLIZINE HCL 25 MG PO TABS
25.0000 mg | ORAL_TABLET | Freq: Three times a day (TID) | ORAL | Status: DC | PRN
  Filled 2019-02-12: qty 1

## 2019-02-12 MED ORDER — ATENOLOL 100 MG PO TABS
100.0000 mg | ORAL_TABLET | Freq: Every day | ORAL | Status: DC
  Administered 2019-02-12 – 2019-02-13 (×2): 100 mg via ORAL
  Filled 2019-02-12 (×4): qty 1

## 2019-02-12 MED ORDER — ASPIRIN EC 81 MG PO TBEC
81.0000 mg | DELAYED_RELEASE_TABLET | Freq: Every day | ORAL | Status: DC
  Administered 2019-02-12 – 2019-02-13 (×2): 81 mg via ORAL
  Filled 2019-02-12 (×2): qty 1

## 2019-02-12 MED ORDER — ENOXAPARIN SODIUM 40 MG/0.4ML ~~LOC~~ SOLN
40.0000 mg | SUBCUTANEOUS | Status: DC
  Administered 2019-02-12 – 2019-02-13 (×2): 40 mg via SUBCUTANEOUS
  Filled 2019-02-12 (×2): qty 0.4

## 2019-02-12 MED ORDER — SODIUM CHLORIDE 0.9 % IV SOLN
Freq: Once | INTRAVENOUS | Status: AC
  Administered 2019-02-12: 01:00:00 via INTRAVENOUS

## 2019-02-12 NOTE — ED Notes (Signed)
Pt drank 7 oz of water, said that's all he feels like drinking, and I encouraged him to drink more water. He said, "I feel a little bit nauseous and not too bad."

## 2019-02-12 NOTE — H&P (Signed)
History and Physical    Phillip Powell RKY:[id number] DOB: 1945-08-26 DOA: 02/11/2019  PCP: Unk Pinto, MD  Patient coming from: Home  I have personally briefly reviewed patient's old medical records available.   Chief Complaint: Diarrhea, dry cough and dehydration.  HPI: Phillip Powell is a 73 y.o. male with medical history significant of type 2 diabetes on oral hypoglycemics, GERD, hypertension, obstructive sleep apnea on CPAP, BPH on Flomax who is presenting to the emergency room with dry cough, congestion and fever chills at home.  Patient started feeling sick about 6 days ago with not feeling well, dry cough with no sputum production, feeling weak, low-grade temperature not recorded at home.  His wife had COVID-19 and is admitted to [facility].  He was tested yesterday and was positive for COVID-19.  Patient continues to have poor appetite and feels nauseated all the time but no vomiting.  He has congestion but no shortness of breath occasional dry cough.  Has been walking around feeling weak but no shortness of breath or wheezing.  No primary lung problems.  He has loose watery diarrhea 2-3 times a day, last bowel movement was yesterday at home before coming to the ER.  Has not been eating or drinking much because of nausea. ED Course: Febrile with temperature of 101.3, blood pressures are adequate.  93% on room air.  WBC count is normal  Review of Systems: all systems are reviewed and pertinent positive as per HPI otherwise rest are negative.  Clinically dehydrated.  Creatinine is 1.75, baseline creatinine was normal from about a year ago.  Blood sugars 200.  Chest x-ray shows some atelectasis but fairly normal.  Patient found with significant weakness, acute kidney injury and clinical dehydration and suggested hospitalization and treatment.   Past Medical History:  Diagnosis Date  . BPH (benign prostatic hypertrophy)   . Diabetes mellitus without complication ([place])   .  GERD (gastroesophageal reflux disease)   . Headache    PMH  . Hypertension   . Hypogonadism male   . Mass    left shoulder AC joint  . OSA (obstructive sleep apnea)    wears CPAP  . PONV (postoperative nausea and vomiting)   . Sarcoidosis   . Vitamin D deficiency     Past Surgical History:  Procedure Laterality Date  . APPENDECTOMY    . COLONOSCOPY    . HAND SURGERY Right 2019   Dr. Amedeo Plenty, tendon repair of R thumb  . HERNIA REPAIR  [id number]   umbilical  . KNEE ARTHROSCOPY    . ORIF CLAVICULAR FRACTURE Left 03/29/2018   Procedure: LEFT SHOULDER OPEN DISTAL CLAVICLE RESECTION, CYST EXCISION;  Surgeon: Netta Cedars, MD;  Location: [facility];  Service: Orthopedics;  Laterality: Left;  . ROTATOR CUFF REPAIR Left 1996  . ROTATOR CUFF REPAIR Right 1997  . TONSILLECTOMY  1952     reports that he has never smoked. He has never used smokeless tobacco. He reports current alcohol use. He reports that he does not use drugs.  Allergies  Allergen Reactions  . Ace Inhibitors Other (See Comments)    Unknown  . Ibuprofen Other (See Comments)    GI upset  . Invokana [Canagliflozin] Other (See Comments)    Unknown  . Metformin And Related Diarrhea  . Quinidine Other (See Comments)    Unknown    Family History  Problem Relation Age of Onset  . Thyroid disease Father   . Cancer Father  lung  . Cancer Mother        throat  . Diabetes Mother   . Thyroid disease Sister   . Hypertension Son   . Hyperlipidemia Son   . Cancer Paternal Grandmother        colon     Prior to Admission medications   Medication Sig Start Date End Date Taking? Authorizing Provider  aspirin EC 81 MG tablet Take 81 mg by mouth daily.    [provider]  atenolol (TENORMIN) 100 MG tablet Take 1 tablet Daily for BP 11/13/18   Unk Pinto, MD  B-D 3CC LUER-LOK SYR 21GX1" 21G X 1" 3 ML MISC use as directed 10/17/16   Unk Pinto, MD  Blood Glucose Monitoring Suppl DEVI Use meter three times a  day to check blood sugar 05/20/18   Liane Comber, NP  Cholecalciferol (VITAMIN D3) 2000 units TABS Take 6,000 Units by mouth daily.     [provider]  Cinnamon 500 MG capsule Take 2,000 mg by mouth daily.     [provider]  famotidine (PEPCID) 40 MG tablet Take 1 tablet at bedtime for Acid Reflux Patient not taking: Reported on 02/06/2019 10/28/18   Unk Pinto, MD  glucose blood test strip Test blood sugar three times a day 05/20/18   Liane Comber, NP  glyBURIDE (DIABETA) 5 MG tablet Take 1/2 to 1 tablet 3 x /day before meals as directed for Diabetes 06/12/18   Unk Pinto, MD  hydrochlorothiazide (HYDRODIURIL) 25 MG tablet Take 1 Tablet Daily for BP & Fluid 10/24/18   Unk Pinto, MD  Lancets MISC Check blood sugars up to three times daily 05/20/18   Liane Comber, NP  losartan (COZAAR) 100 MG tablet Take 1 tablet Daily for BP 11/13/18   Unk Pinto, MD  Magnesium 500 MG TABS Take 0.5 tablets (250 mg total) by mouth 3 (three) times daily. Patient taking differently: Take 500 mg by mouth daily.  05/21/18   Liane Comber, NP  meclizine (ANTIVERT) 25 MG tablet Take 25 mg by mouth 3 (three) times daily as needed for dizziness.    [provider]  meloxicam (MOBIC) 15 MG tablet Take 1/2 to 1 tablet daily with food for pain & Inflammation 07/10/18   Unk Pinto, MD  ondansetron [facility]) 8 MG tablet Take 1/2 to 1 tablet 3 x day if needed for Nausea 02/08/19   Unk Pinto, MD  sitaGLIPtin-metformin (JANUMET) 50-500 MG tablet Take 1 tablet 2 x /day with a meal for Diabetes 01/05/19   Unk Pinto, MD  tamsulosin (FLOMAX) 0.4 MG CAPS capsule TAKE 1 CAPSULE(0.4 MG) BY MOUTH DAILY 09/11/18   Unk Pinto, MD  TURMERIC PO Take 1 capsule by mouth daily.    [provider]  vitamin C (ASCORBIC ACID) 500 MG tablet Take 500 mg by mouth 2 (two) times daily.    [provider]  zinc gluconate 50 MG tablet Take 50 mg by mouth  daily.    [provider]    Physical Exam: Vitals:   02/12/19 0600 02/12/19 0630 02/12/19 0650 02/12/19 0700  BP: 128/66 (!) 143/67  (!) 158/80  Pulse: 73 72 79 79  Resp: (!) 25 (!) 22 (!) 23 12  Temp:   99.6 F (37.6 C)   TempSrc:   Oral   SpO2: 95% 93% 95% 94%  Weight:      Height:        Constitutional: NAD, calm, comfortable Vitals:   02/12/19 0600 02/12/19  0630 02/12/19 0650 02/12/19 0700  BP: 128/66 (!) 143/67  (!) 158/80  Pulse: 73 72 79 79  Resp: (!) 25 (!) 22 (!) 23 12  Temp:   99.6 F (37.6 C)   TempSrc:   Oral   SpO2: 95% 93% 95% 94%  Weight:      Height:       Eyes: PERRL, lids and conjunctivae normal.  On room air. ENMT: Mucous membranes are dry. Posterior pharynx clear of any exudate or lesions.Normal dentition.  Neck: normal, supple, no masses, no thyromegaly Respiratory: clear to auscultation bilaterally, no wheezing, no crackles. Normal respiratory effort. No accessory muscle use.  Cardiovascular: Regular rate and rhythm, no murmurs / rubs / gallops. No extremity edema. 2+ pedal pulses. No carotid bruits.  Abdomen: no tenderness, no masses palpated. No hepatosplenomegaly. Bowel sounds positive.  Obese and pendulous. Musculoskeletal: no clubbing / cyanosis. No joint deformity upper and lower extremities. Good ROM, no contractures. Normal muscle tone.  Skin: no rashes, lesions, ulcers. No induration Neurologic: CN 2-12 grossly intact. Sensation intact, DTR normal. Strength 5/5 in all 4.  Psychiatric: Normal judgment and insight. Alert and oriented x 3.  Slightly anxious.    Labs on Admission: I have personally reviewed following labs and imaging studies  CBC: Recent Labs  Lab 02/11/19 2321  WBC 6.2  NEUTROABS 4.4  HGB 13.7  HCT 41.4  MCV 82.5  PLT 211   Basic Metabolic Panel: Recent Labs  Lab 02/11/19 2321  NA 134*  K 3.8  CL 95*  CO2 27  GLUCOSE 201*  BUN 33*  CREATININE 1.75*  CALCIUM 8.5*  MG 1.9   GFR: Estimated  Creatinine Clearance: 46.1 mL/min (A) (by C-G formula based on SCr of 1.75 mg/dL (H)). Liver Function Tests: Recent Labs  Lab 02/11/19 2321  AST 30  ALT 26  ALKPHOS 55  BILITOT 0.7  PROT 7.0  ALBUMIN 3.4*   Recent Labs  Lab 02/11/19 2321  LIPASE 42   No results for input(s): AMMONIA in the last 168 hours. Coagulation Profile: No results for input(s): INR, PROTIME in the last 168 hours. Cardiac Enzymes: No results for input(s): CKTOTAL, CKMB, CKMBINDEX, TROPONINI in the last 168 hours. BNP (last 3 results) No results for input(s): PROBNP in the last 8760 hours. HbA1C: No results for input(s): HGBA1C in the last 72 hours. CBG: No results for input(s): GLUCAP in the last 168 hours. Lipid Profile: No results for input(s): CHOL, HDL, LDLCALC, TRIG, CHOLHDL, LDLDIRECT in the last 72 hours. Thyroid Function Tests: No results for input(s): TSH, T4TOTAL, FREET4, T3FREE, THYROIDAB in the last 72 hours. Anemia Panel: No results for input(s): VITAMINB12, FOLATE, FERRITIN, TIBC, IRON, RETICCTPCT in the last 72 hours. Urine analysis:    Component Value Date/Time   COLORURINE YELLOW 10/14/2018 [place] 10/14/2018 0951   LABSPEC 1.021 10/14/2018 0951   PHURINE < OR = 5.0 10/14/2018 0951   GLUCOSEU NEGATIVE 10/14/2018 0951   HGBUR NEGATIVE 10/14/2018 0951   BILIRUBINUR NEGATIVE 08/07/2016 1217   KETONESUR NEGATIVE 10/14/2018 0951   PROTEINUR NEGATIVE 10/14/2018 0951   NITRITE NEGATIVE 10/14/2018 0951   LEUKOCYTESUR NEGATIVE 10/14/2018 0951    Radiological Exams on Admission: Dg Chest Portable 1 View  Result Date: 02/11/2019 CLINICAL DATA:  Cough and fever, positive COVID-19 testing EXAM: PORTABLE CHEST 1 VIEW COMPARISON:  08/19/2012 FINDINGS: Cardiac shadow is within normal limits. The lungs are well aerated bilaterally. Some minimal atelectatic changes are noted in the bases. No  focal confluent infiltrate is seen. Aortic calcifications are noted. No bony  abnormality is seen. IMPRESSION: Minimal bibasilar atelectasis. Electronically Signed   By: Inez Catalina M.D.   On: 02/11/2019 21:43    EKG: Independently reviewed.  Normal sinus rhythm.  No acute changes.  Assessment/Plan Principal Problem:   Nausea & vomiting Active Problems:   Hypertension   Type 2 diabetes mellitus (H[facility])   GERD (gastroesophageal reflux disease)   OSA (obstructive sleep apnea)   BPH (benign prostatic hyperplasia)   Hyperlipidemia associated with type 2 diabetes mellitus ([place])   CKD stage 3 due to type 2 diabetes mellitus ([place])   2019 novel coronavirus disease (COVID-19)   AKI (acute kidney injury) ([place])     1.  Nausea and vomiting, diarrhea non-intractable due to COVID-19 infection: Agree with monitoring in the hospital given severity of symptoms.  Patient will be started on IV fluids.  Intake and output monitoring.  Recheck renal functions in the morning after IV fluid hydration. Symptomatic treatment with antinausea medications that are needed to be given IV. Will challenge patient with clear liquid diet and advance diet as he tolerates it.  2.  Acute kidney injury: Due to #1.  As above.  Output monitoring and IV fluid administration.  3.  Hypertension: Blood pressures are fairly stable.  Will hold lisinopril and hydrochlorothiazide today given #1.  Will treat with as needed medicine if blood pressures more than 180.  May resume his medications if renal functions normalized.  We will continue his beta-blockers including atenolol to avoid tachycardia.  4.  Type 2 diabetes: Patient on oral hypoglycemics including metformin.  Hold and keep on sliding scale insulin.  5.  Obstructive sleep apnea: We will avoid CPAP, can use high flow oxygen at night if needed because of active infection to avoid aerosolization.  6.  COVID-19 infection: Patient is on room air, does not have much pulmonary symptoms.  Chest x-ray is without evidence of viral pneumonia.  Will check his  inflammatory markers daily to monitor for any systemic manifestations of the COVID-19.  Mild increase in inflammatory markers are anticipated.  At this time, due to lack of pulmonary symptoms, he does not need to be treated with the steroids and or antivirals.  Will need close monitoring and reassessment.  Patient has severe systemic disease, symptoms not managed at home and needing IV nausea medications and IV fluid treatment and monitoring in the hospital.   DVT prophylaxis: Lovenox subcu Code Status: Full code Family Communication: None Disposition Plan: Home after hospitalization Consults called: None Admission status: Observation   Barb Merino MD [facility] Pager [phone]  If 7PM-7AM, please contact night-coverage [email or web address] Password [facility]  02/12/2019, 8:35 AM

## 2019-02-12 NOTE — ED Notes (Addendum)
Pt said he would like nausea medication.

## 2019-02-12 NOTE — ED Notes (Signed)
Pt given lunch tray.

## 2019-02-12 NOTE — ED Notes (Signed)
[facility] called for transport. 

## 2019-02-12 NOTE — ED Notes (Signed)
ED TO INPATIENT HANDOFF REPORT  Name/Age/Gender Phillip Powell 73 y.o. male  Code Status    Code Status Orders  (From admission, onward)         Start     Ordered   02/12/19 0855  Full code  Continuous     02/12/19 0854        Code Status History    Date Active Date Inactive Code Status Order ID Comments User Context   02/12/2019 0854 02/12/2019 0854 Full Code [id number]  Barb Merino, MD ED   Advance Care Planning Activity      Home/SNF/Other Home  Chief Complaint Covid Positive   Level of Care/Admitting Diagnosis ED Disposition    ED Disposition Condition [place]: [place] [[id number]]  Level of Care: Med-Surg [16]  Covid Evaluation: Confirmed COVID Positive  Diagnosis: 2019 novel coronavirus disease (COVID-19) [[id number]]  Admitting Physician: Barb Merino [[id number]]  Attending Physician: Barb Merino [[id number]]  Estimated length of stay: past midnight tomorrow  Certification:: I certify this patient will need inpatient services for at least 2 midnights  PT Class (Do Not Modify): Inpatient [101]  PT Acc Code (Do Not Modify): Private [1]       Medical History Past Medical History:  Diagnosis Date  . BPH (benign prostatic hypertrophy)   . Diabetes mellitus without complication ([place])   . GERD (gastroesophageal reflux disease)   . Headache    PMH  . Hypertension   . Hypogonadism male   . Mass    left shoulder AC joint  . OSA (obstructive sleep apnea)    wears CPAP  . PONV (postoperative nausea and vomiting)   . Sarcoidosis   . Vitamin D deficiency     Allergies Allergies  Allergen Reactions  . Ace Inhibitors Other (See Comments)    Unknown  . Ibuprofen Other (See Comments)    GI upset  . Invokana [Canagliflozin] Other (See Comments)    Unknown  . Metformin And Related Diarrhea  . Quinidine Other (See Comments)    Unknown    IV Location/Drains/Wounds Patient Lines/Drains/Airways Status    Active Line/Drains/Airways    Name:   Placement date:   Placement time:   Site:   Days:   Peripheral IV 02/11/19 Left Antecubital   02/11/19    2327    Antecubital   1   Incision (Closed) 03/29/18 Shoulder Left   03/29/18    1402     320          Labs/Imaging Results for orders placed or performed during the hospital encounter of 02/11/19 (from the past 48 hour(s))  CBC with Differential     Status: None   Collection Time: 02/11/19 11:21 PM  Result Value Ref Range   WBC 6.2 4.0 - 10.5 K/uL   RBC 5.02 4.22 - 5.81 MIL/uL   Hemoglobin 13.7 13.0 - 17.0 g/dL   HCT 41.4 39.0 - 52.0 %   MCV 82.5 80.0 - 100.0 fL   MCH 27.3 26.0 - 34.0 pg   MCHC 33.1 30.0 - 36.0 g/dL   RDW 13.4 11.5 - 15.5 %   Platelets 188 150 - 400 K/uL   nRBC 0.0 0.0 - 0.2 %   Neutrophils Relative % 71 %   Neutro Abs 4.4 1.7 - 7.7 K/uL   Lymphocytes Relative 20 %   Lymphs Abs 1.2 0.7 - 4.0 K/uL   Monocytes Relative 8 %   Monocytes Absolute 0.5  0.1 - 1.0 K/uL   Eosinophils Relative 0 %   Eosinophils Absolute 0.0 0.0 - 0.5 K/uL   Basophils Relative 0 %   Basophils Absolute 0.0 0.0 - 0.1 K/uL   Immature Granulocytes 1 %   Abs Immature Granulocytes 0.03 0.00 - 0.07 K/uL    Comment: Performed at [facility], [place] [address] [place], [place] [address]  Comprehensive metabolic panel     Status: Abnormal   Collection Time: 02/11/19 11:21 PM  Result Value Ref Range   Sodium 134 (L) 135 - 145 mmol/L   Potassium 3.8 3.5 - 5.1 mmol/L   Chloride 95 (L) 98 - 111 mmol/L   CO2 27 22 - 32 mmol/L   Glucose, Bld 201 (H) 70 - 99 mg/dL   BUN 33 (H) 8 - 23 mg/dL   Creatinine, Ser 1.75 (H) 0.61 - 1.24 mg/dL   Calcium 8.5 (L) 8.9 - 10.3 mg/dL   Total Protein 7.0 6.5 - 8.1 g/dL   Albumin 3.4 (L) 3.5 - 5.0 g/dL   AST 30 15 - 41 U/L   ALT 26 0 - 44 U/L   Alkaline Phosphatase 55 38 - 126 U/L   Total Bilirubin 0.7 0.3 - 1.2 mg/dL   GFR calc non Af Amer 38 (L) >60 mL/min   GFR calc Af Amer 44 (L) >60 mL/min    Anion gap 12 5 - 15    Comment: Performed at [facility], [place] [address], [place], [place] [address]  Lipase, blood     Status: None   Collection Time: 02/11/19 11:21 PM  Result Value Ref Range   Lipase 42 11 - 51 U/L    Comment: Performed at [facility], [place] [address], [place], [place] [address]  Magnesium     Status: None   Collection Time: 02/11/19 11:21 PM  Result Value Ref Range   Magnesium 1.9 1.7 - 2.4 mg/dL    Comment: Performed at [facility], [place] [address], [place], [place] [address]  SARS Coronavirus 2 (CEPHEID- Performed in [facility] hospital lab), Hosp Order     Status: Abnormal   Collection Time: 02/12/19  1:29 AM   Specimen: Nasopharyngeal Swab  Result Value Ref Range   SARS Coronavirus 2 POSITIVE (A) NEGATIVE    Comment: RESULT CALLED TO, READ BACK BY AND VERIFIED WITH: WICKER,K @ 0424 ON [id number] BY POTEAT,S Performed at [place] [address] [place], [place] [address]   ABO/Rh     Status: None   Collection Time: 02/12/19  9:25 AM  Result Value Ref Range   ABO/RH(D)      A POS Performed at [facility], [place] [address], [place], [place] [address]   D-dimer, quantitative (not at [facility])     Status: Abnormal   Collection Time: 02/12/19  9:25 AM  Result Value Ref Range   D-Dimer, Quant 0.64 (H) 0.00 - 0.50 ug/mL-FEU    Comment: (NOTE) At the manufacturer cut-off of 0.50 ug/mL FEU, this assay has been documented to exclude PE with a sensitivity and negative predictive value of 97 to 99%.  At this time, this assay has not been approved by the FDA to exclude DVT/VTE. Results should be correlated with clinical presentation. Performed at [facility], [place] [address] [place], [place] [address]   Ferritin     Status: Abnormal   Collection Time: 02/12/19  9:25 AM  Result Value Ref Range  Ferritin 1,968 (H) 24 - 336 ng/mL    Comment:  Performed at [facility], [place] [address] [place], [place] [address]  Fibrinogen     Status: Abnormal   Collection Time: 02/12/19  9:25 AM  Result Value Ref Range   Fibrinogen 614 (H) 210 - 475 mg/dL    Comment: Performed at [facility], [place] [address] [place], [place] [address]  Procalcitonin     Status: None   Collection Time: 02/12/19  9:25 AM  Result Value Ref Range   Procalcitonin <0.10 ng/mL    Comment:        Interpretation: PCT (Procalcitonin) <= 0.5 ng/mL: Systemic infection (sepsis) is not likely. Local bacterial infection is possible. (NOTE)       Sepsis PCT Algorithm           Lower Respiratory Tract                                      Infection PCT Algorithm    ----------------------------     ----------------------------         PCT < 0.25 ng/mL                PCT < 0.10 ng/mL         Strongly encourage             Strongly discourage   discontinuation of antibiotics    initiation of antibiotics    ----------------------------     -----------------------------       PCT 0.25 - 0.50 ng/mL            PCT 0.10 - 0.25 ng/mL               OR       >80% decrease in PCT            Discourage initiation of                                            antibiotics      Encourage discontinuation           of antibiotics    ----------------------------     -----------------------------         PCT >= 0.50 ng/mL              PCT 0.26 - 0.50 ng/mL               AND        <80% decrease in PCT             Encourage initiation of                                             antibiotics       Encourage continuation           of antibiotics    ----------------------------     -----------------------------        PCT >= 0.50 ng/mL                  PCT > 0.50 ng/mL  AND         increase in PCT                  Strongly encourage                                      initiation of antibiotics    Strongly encourage escalation            of antibiotics                                     -----------------------------                                           PCT <= 0.25 ng/mL                                                 OR                                        > 80% decrease in PCT                                     Discontinue / Do not initiate                                             antibiotics Performed at [place] [address] [place], [place] [address]   Lactate dehydrogenase     Status: None   Collection Time: 02/12/19  9:25 AM  Result Value Ref Range   LDH 189 98 - 192 U/L    Comment: Performed at [facility], [place] [address], [place], [place] [address]  CBG monitoring, ED     Status: Abnormal   Collection Time: 02/12/19 12:03 PM  Result Value Ref Range   Glucose-Capillary 190 (H) 70 - 99 mg/dL   Dg Chest Portable 1 View  Result Date: 02/11/2019 CLINICAL DATA:  Cough and fever, positive COVID-19 testing EXAM: PORTABLE CHEST 1 VIEW COMPARISON:  08/19/2012 FINDINGS: Cardiac shadow is within normal limits. The lungs are well aerated bilaterally. Some minimal atelectatic changes are noted in the bases. No focal confluent infiltrate is seen. Aortic calcifications are noted. No bony abnormality is seen. IMPRESSION: Minimal bibasilar atelectasis. Electronically Signed   By: Inez Catalina M.D.   On: 02/11/2019 21:43    Pending Labs Unresulted Labs (From admission, onward)    Start     Ordered   02/13/19 0500  CBC with Differential/Platelet  Daily,   R     02/12/19 0854   02/13/19 0500  Magnesium  Daily,   R     02/12/19 0854   02/13/19 0500  Phosphorus  Daily,   R     02/12/19 0854  02/13/19 0500  Comprehensive metabolic panel  Daily,   R     02/12/19 0854   02/13/19 0500  C-reactive protein  Daily,   R     02/12/19 0854   02/13/19 0500  CK  Daily,   R     02/12/19 0854   02/12/19 0855  Interleukin-6, Plasma  Once,   STAT     02/12/19 0854           Vitals/Pain Today's Vitals   02/12/19 1430 02/12/19 1600 02/12/19 1615 02/12/19 1630  BP: 131/67 138/64  132/73  Pulse: 61 65 62 60  Resp: 20 20 19  (!) 25  Temp:      TempSrc:      SpO2: 93% 98% 96% 97%  Weight:      Height:      PainSc:        Isolation Precautions Airborne and Contact precautions  Medications Medications  aspirin EC tablet 81 mg (81 mg Oral Given 02/12/19 0911)  atenolol (TENORMIN) tablet 100 mg (100 mg Oral Given 02/12/19 1205)  meclizine (ANTIVERT) tablet 25 mg (has no administration in time range)  tamsulosin (FLOMAX) capsule 0.4 mg (0.4 mg Oral Given 02/12/19 0909)  enoxaparin (LOVENOX) injection 40 mg (has no administration in time range)  0.9 %  sodium chloride infusion ( Intravenous New Bag/Given 02/12/19 0912)  acetaminophen (TYLENOL) tablet 650 mg (has no administration in time range)    Or  acetaminophen (TYLENOL) suppository 650 mg (has no administration in time range)  ondansetron (ZOFRAN) tablet 4 mg (has no administration in time range)    Or  ondansetron (ZOFRAN) injection 4 mg (has no administration in time range)  guaiFENesin (MUCINEX) 12 hr tablet 600 mg (600 mg Oral Given 02/12/19 0909)  insulin aspart (novoLOG) injection 0-9 Units (2 Units Subcutaneous Refused 02/12/19 1300)  insulin aspart (novoLOG) injection 0-5 Units (has no administration in time range)  albuterol (VENTOLIN HFA) 108 (90 Base) MCG/ACT inhaler 2 puff (has no administration in time range)  sodium chloride 0.9 % bolus 1,000 mL (0 mLs Intravenous Stopped 02/12/19 0056)  chlorpheniramine-HYDROcodone (TUSSIONEX) 10-8 MG/5ML suspension 5 mL (5 mLs Oral Given 02/11/19 2330)  ondansetron (ZOFRAN) injection 4 mg (4 mg Intravenous Given 02/11/19 2329)  0.9 %  sodium chloride infusion ( Intravenous Stopped 02/12/19 0910)  ondansetron (ZOFRAN) injection 4 mg (4 mg Intravenous Given 02/12/19 0154)    Mobility walks

## 2019-02-12 NOTE — ED Notes (Signed)
Date and time results received: 02/12/19 0424 (use smartphrase ".now" to insert current time)  Test: COVID Critical Value: +  Name of Provider Notified: [facility]  Orders Received? Or Actions Taken?: Orders Received - See Orders for details

## 2019-02-12 NOTE — ED Notes (Addendum)
Pt refused 2 units of insulin.  CBG 190.  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

## 2019-02-12 NOTE — ED Notes (Addendum)
Pt refused rectal temp. I reminded him that I took an oral and rectal at the same time, earlier in the morning, and there was a 3 degree difference. Pt requested [facility]. I told him we don't have mountain dew. He requested ginger ale and a popsicle. I took him ginger ale and popsicle.

## 2019-02-12 NOTE — ED Notes (Addendum)
Phillip Powell, pt spouse, [phone] called from [facility]. She asked me to have pt turn on his cell phone to call her and their children. She said, "Have ya'll checked his sugar." Told her it was 201 at 2321. She said, "He has 2 really, really bad knees and doesn't walk well."

## 2019-02-12 NOTE — ED Notes (Addendum)
Ambulated patient. Pt maintained O2 at 95-97%. Pt said he can't drink any more water because it will make him nauseous.

## 2019-02-12 NOTE — Progress Notes (Signed)
Patient arrived via [facility], vitals stable, no complaints of pain, afebrile, on room air.  Dinner ordered.  Wife updated that patient arrived at [facility] (also a patient here).  Continue to monitor patient.

## 2019-02-13 DIAGNOSIS — I10 Essential (primary) hypertension: Secondary | ICD-10-CM

## 2019-02-13 DIAGNOSIS — G4733 Obstructive sleep apnea (adult) (pediatric): Secondary | ICD-10-CM

## 2019-02-13 DIAGNOSIS — E1169 Type 2 diabetes mellitus with other specified complication: Secondary | ICD-10-CM

## 2019-02-13 DIAGNOSIS — E785 Hyperlipidemia, unspecified: Secondary | ICD-10-CM

## 2019-02-13 LAB — COMPREHENSIVE METABOLIC PANEL
ALT: 21 U/L (ref 0–44)
AST: 24 U/L (ref 15–41)
Albumin: 2.8 g/dL — ABNORMAL LOW (ref 3.5–5.0)
Alkaline Phosphatase: 49 U/L (ref 38–126)
Anion gap: 9 (ref 5–15)
BUN: 23 mg/dL (ref 8–23)
CO2: 27 mmol/L (ref 22–32)
Calcium: 8.3 mg/dL — ABNORMAL LOW (ref 8.9–10.3)
Chloride: 102 mmol/L (ref 98–111)
Creatinine, Ser: 1.28 mg/dL — ABNORMAL HIGH (ref 0.61–1.24)
GFR calc Af Amer: >60 mL/min (ref >60)
GFR calc non Af Amer: 56 mL/min — ABNORMAL LOW (ref >60)
Glucose, Bld: 125 mg/dL — ABNORMAL HIGH (ref 70–99)
Potassium: 4.4 mmol/L (ref 3.5–5.1)
Sodium: 138 mmol/L (ref 135–145)
Total Bilirubin: 0.5 mg/dL (ref 0.3–1.2)
Total Protein: 6 g/dL — ABNORMAL LOW (ref 6.5–8.1)

## 2019-02-13 LAB — CBC WITH DIFFERENTIAL/PLATELET
Abs Immature Granulocytes: 0.02 10*3/uL (ref 0.00–0.07)
Basophils Absolute: 0 10*3/uL (ref 0.0–0.1)
Basophils Relative: 0 %
Eosinophils Absolute: 0 10*3/uL (ref 0.0–0.5)
Eosinophils Relative: 0 %
HCT: 37.9 % — ABNORMAL LOW (ref 39.0–52.0)
Hemoglobin: 12.2 g/dL — ABNORMAL LOW (ref 13.0–17.0)
Immature Granulocytes: 0 %
Lymphocytes Relative: 25 %
Lymphs Abs: 1.4 10*3/uL (ref 0.7–4.0)
MCH: 27.3 pg (ref 26.0–34.0)
MCHC: 32.2 g/dL (ref 30.0–36.0)
MCV: 84.8 fL (ref 80.0–100.0)
Monocytes Absolute: 0.4 10*3/uL (ref 0.1–1.0)
Monocytes Relative: 8 %
Neutro Abs: 3.8 10*3/uL (ref 1.7–7.7)
Neutrophils Relative %: 67 %
Platelets: 188 10*3/uL (ref 150–400)
RBC: 4.47 MIL/uL (ref 4.22–5.81)
RDW: 13.7 % (ref 11.5–15.5)
WBC: 5.8 10*3/uL (ref 4.0–10.5)
nRBC: 0 % (ref 0.0–0.2)

## 2019-02-13 LAB — PHOSPHORUS: Phosphorus: 2.2 mg/dL — ABNORMAL LOW (ref 2.5–4.6)

## 2019-02-13 LAB — GLUCOSE, CAPILLARY
Glucose-Capillary: 151 mg/dL — ABNORMAL HIGH (ref 70–99)
Glucose-Capillary: 293 mg/dL — ABNORMAL HIGH (ref 70–99)

## 2019-02-13 LAB — C-REACTIVE PROTEIN: CRP: 7.4 mg/dL — ABNORMAL HIGH (ref <1.0)

## 2019-02-13 LAB — CK: Total CK: 35 U/L — ABNORMAL LOW (ref 49–397)

## 2019-02-13 LAB — INTERLEUKIN-6, PLASMA: Interleukin-6, Plasma: 81.4 pg/mL — ABNORMAL HIGH (ref 0.0–12.2)

## 2019-02-13 LAB — MAGNESIUM: Magnesium: 1.8 mg/dL (ref 1.7–2.4)

## 2019-02-13 MED ORDER — DEXAMETHASONE SODIUM PHOSPHATE 10 MG/ML IJ SOLN
8.0000 mg | INTRAMUSCULAR | Status: DC
  Administered 2019-02-13: 8 mg via INTRAVENOUS
  Filled 2019-02-13: qty 1

## 2019-02-13 MED ORDER — DEXAMETHASONE 6 MG PO TABS: 6.0000 mg | ORAL_TABLET | Freq: Every day | ORAL | 0 refills | Status: AC

## 2019-02-13 NOTE — Progress Notes (Signed)
Discharge instructions given, questions answered. IV removed. Patient taken to visit with wife prior to discharge.

## 2019-02-13 NOTE — Evaluation (Signed)
Physical Therapy Evaluation and Discharge  Patient Details Name: Phillip Powell MRN: [id number] DOB: 1945-08-23 Today's Date: 02/13/2019   History of Present Illness   73 y.o. male with medical history significant of type 2 diabetes, GERD, hypertension, obstructive sleep apnea on CPAP, BPH, sarcoidosis who is presenting to the emergency room with dry cough, congestion and fever chills at home.  Patient started feeling sick about 6 days ago with not feeling well, dry cough with no sputum production, feeling weak, low-grade temperature not recorded at home.  His wife had COVID-19 and is admitted to [facility].    Clinical Impression  Patient evaluated by Physical Therapy with no further PT needs identified. All education has been completed and the patient has no further questions. Patient very distracted by phone call from his wife as PT initiated session (re: transportation home and obtaining a house key--his wife is currently hospitalized). He was only willing to answer questions and listen to advice from PT re: safety at home. He demonstrated standing and did not want to walk. RN confirms he has been walking to the bathroom himself.   PT is signing off. Thank you for this referral.     Follow Up Recommendations No PT follow up    Equipment Recommendations  None recommended by PT    Recommendations for Other Services       Precautions / Restrictions Precautions Precautions: None      Mobility  Bed Mobility                  Transfers Overall transfer level: Modified independent               General transfer comment: uses bil UEs to push up  Ambulation/Gait             General Gait Details: pt refused; states has been walking himself to the bathroom and does not need to go right now (pt irritated after phone call with his wife with difficulty arranging transportation home for d/c today  Stairs            Wheelchair Mobility    Modified  [place] (Stroke Patients Only)       Balance Overall balance assessment: No apparent balance deficits (not formally assessed)(stood without UE suppport, shifts weight, turns head)                                           Pertinent Vitals/Pain Pain Assessment: Faces Faces Pain Scale: Hurts little more Pain Location: left knee  Pain Descriptors / Indicators: Nagging;Grimacing Pain Intervention(s): Limited activity within patient's tolerance;Monitored during session    Home Living Family/patient expects to be discharged to:: Private residence Living Arrangements: Spouse/significant other Available Help at Discharge: Family;Available 24 hours/day Type of Home: House Home Access: Ramped entrance     Home Layout: One level Home Equipment: Walker - 2 wheels;Cane - single point;Bedside commode(can use BSC in shower (per pt))      Prior Function Level of Independence: Independent         Comments: drives truck; needs lt knee replacement; has felt generally weak but no near falls or fainting;      Hand Dominance   Dominant Hand: Right    Extremity/Trunk Assessment   Upper Extremity Assessment Upper Extremity Assessment: Overall WFL for tasks assessed    Lower Extremity Assessment Lower Extremity Assessment:  LLE deficits/detail LLE Deficits / Details: severe arthritis knee;  LLE: Unable to fully assess due to pain    Cervical / Trunk Assessment Cervical / Trunk Assessment: Other exceptions Cervical / Trunk Exceptions: overweight  Communication   Communication: No difficulties  Cognition Arousal/Alertness: Awake/alert Behavior During Therapy: WFL for tasks assessed/performed(irritated; trying to arrange transportation home) Overall Cognitive Status: Within Functional Limits for tasks assessed                                        General Comments General comments (skin integrity, edema, etc.): Pt very distracted by phone call  from his wife as PT initiated session. Patient confirmed home layout and DME he owns (does not currently use any). He states he knows how to use RW should he become weaker at home. Educated on using BSC in shower as a chair and recommend doing this at this time to limit his fall risk in the shower. Educated to not use steamy, hot water as this could make him faint.     Exercises     Assessment/Plan    PT Assessment Patent does not need any further PT services  PT Problem List         PT Treatment Interventions      PT Goals (Current goals can be found in the Care Plan section)  Acute Rehab PT Goals PT Goal Formulation: All assessment and education complete, DC therapy    Frequency     Barriers to discharge        Co-evaluation               AM-PAC PT "6 Clicks" Mobility  Outcome Measure Help needed turning from your back to your side while in a flat bed without using bedrails?: None Help needed moving from lying on your back to sitting on the side of a flat bed without using bedrails?: None Help needed moving to and from a bed to a chair (including a wheelchair)?: None Help needed standing up from a chair using your arms (e.g., wheelchair or bedside chair)?: None Help needed to walk in hospital room?: None Help needed climbing 3-5 steps with a railing? : None 6 Click Score: 24    End of Session   Activity Tolerance: Treatment limited secondary to agitation Patient left: in chair;with call bell/phone within reach Nurse Communication: Mobility status;Other (comment)(pt's reported transportation issues; no PT needs) PT Visit Diagnosis: Difficulty in walking, not elsewhere classified (R26.2)    Time: 9357-0177 PT Time Calculation (min) (ACUTE ONLY): 11 min   Charges:   PT Evaluation $PT Eval Low Complexity: 1 Low            [employer], PT 02/13/2019, 12:05 PM

## 2019-02-13 NOTE — Progress Notes (Signed)
Spoke with wife, update given and questions answered.  Son in law to pick up patient for discharge as wife is also patient at [facility].

## 2019-02-13 NOTE — Discharge Summary (Signed)
Physician Discharge Summary  Phillip Powell XHB:[id number] DOB: [birth year] DOA: 02/11/2019  PCP: Unk Pinto, MD  Admit date: 02/11/2019 Discharge date: 02/13/2019  Admitted From: Home Disposition: Home   Recommendations for Outpatient Follow-up:  1. Follow up with PCP in 1-2 weeks 2. Please obtain CMP/CBC in one week 3. Monitor blood sugars in setting of steroid therapy.  Home Health: None recommended by PT Equipment/Devices: None Discharge Condition: Stable CODE STATUS: Full Diet recommendation: Heart healthy, carb-modified  Brief/Interim Summary: Phillip Powell is a 73 y.o. male with medical history significant of type 2 diabetes on oral hypoglycemics, GERD, hypertension, obstructive sleep apnea on CPAP, BPH on Flomax who is presenting to the emergency room with dry cough, congestion and fever chills at home.  Patient started feeling sick about 6 days ago with not feeling well, dry cough with no sputum production, feeling weak, low-grade temperature not recorded at home.  His wife had COVID-19 and is admitted to [facility].  He was tested yesterday and was positive for COVID-19.  Patient continues to have poor appetite and feels nauseated all the time but no vomiting.  He has congestion but no shortness of breath occasional dry cough.  Has been walking around feeling weak but no shortness of breath or wheezing.  No primary lung problems.  He has loose watery diarrhea 2-3 times a day, last bowel movement was yesterday at home before coming to the ER.  Has not been eating or drinking much because of nausea.  ED Course: Febrile with temperature of 101.3, blood pressures are adequate.  93% on room air.  WBC count is normal  Hospital Course: Patient was admitted and felt subjectively much better after IV fluids. Was tolerating >75% dinner and then breakfast the following morning. Walking independently without hypoxia. He is stable for discharge to home with steroid therapy  initiated.  Discharge Diagnoses:  Principal Problem:   Nausea & vomiting Active Problems:   Hypertension   Type 2 diabetes mellitus (HCC)   GERD (gastroesophageal reflux disease)   OSA (obstructive sleep apnea)   BPH (benign prostatic hyperplasia)   Hyperlipidemia associated with type 2 diabetes mellitus ([place])   CKD stage 3 due to type 2 diabetes mellitus ([place])   2019 novel coronavirus disease (COVID-19)   AKI (acute kidney injury) (HCC)  Nausea and vomiting, diarrhea non-intractable due to COVID-19 infection: Has resolved without recent need for antiemetics. Has zofran at home and is tolerating po well with fully advanced diet.  Acute kidney injury: Resolved with IV fluids and anticipate sustained improvement due to better per oral intake.  Hypertension: Continue home medications  Type 2 diabetes: Patient on oral hypoglycemics including metformin. Continue this at discharge. Anticipate some hyperglycemia from steroids.  Obstructive sleep apnea: Will do better with CPAP at home. Unable to use this while inpatient.  COVID-19 infection: No pulmonary symptoms currently, no infiltrates on CXR, though inflammatory markers elevated. Does not meet criteria for remdesivir and no off-label or investigational therapies are currently indicated. - With CRP >7, will give low dose steroids x7 days to prevent worsening.  Discharge Instructions Discharge Instructions    Diet Carb Modified   Complete by: As directed    Discharge instructions   Complete by: As directed    You are being discharged from the hospital after treatment for dehydration. You are felt to be stable enough to no longer require inpatient monitoring, testing, and treatment, though you will need to follow the recommendations below: - Remain in  self-isolation for 2 weeks to reduce risk of transmitting the virus to others. - Take decadron once daily starting tomorrow for 6 more days to prevent worsening of covid. - You may  take zofran as needed for nausea and vomiting, though if you are unable to tolerate food/liquids or take medications, seek medical attention right away.  - Do not take NSAID medications (including, but not limited to, ibuprofen, advil, motrin, naproxen, aleve, goody's powder, etc.) - Follow up with your doctor in the next week via telehealth or seek medical attention right away if your symptoms get WORSE.  - Consider donating plasma after you have recovered (either 14 days after a negative test or 28 days after symptoms have completely resolved) because your antibodies to this virus may be helpful to give to others with life-threatening infections. Please go to the website www.[email or web address] if you would like to consider volunteering for plasma donation.    Directions for you at home:  Wear a facemask You should wear a facemask that covers your nose and mouth when you are in the same room with other people and when you visit a healthcare provider. People who live with or visit you should also wear a facemask while they are in the same room with you.  Separate yourself from other people in your home As much as possible, you should stay in a different room from other people in your home. Also, you should use a separate bathroom, if available.  Avoid sharing household items You should not share dishes, drinking glasses, cups, eating utensils, towels, bedding, or other items with other people in your home. After using these items, you should wash them thoroughly with soap and water.  Cover your coughs and sneezes Cover your mouth and nose with a tissue when you cough or sneeze, or you can cough or sneeze into your sleeve. Throw used tissues in a lined trash can, and immediately wash your hands with soap and water for at least 20 seconds or use an alcohol-based hand rub.  Wash your [facility] your hands often and thoroughly with soap and water for at least 20 seconds. You can use an alcohol-based  hand sanitizer if soap and water are not available and if your hands are not visibly dirty. Avoid touching your eyes, nose, and mouth with unwashed hands.  Directions for those who live with, or provide care at home for you:  Limit the number of people who have contact with the patient If possible, have only one caregiver for the patient. Other household members should stay in another home or place of residence. If this is not possible, they should stay in another room, or be separated from the patient as much as possible. Use a separate bathroom, if available. Restrict visitors who do not have an essential need to be in the home.  Ensure good ventilation Make sure that shared spaces in the home have good air flow, such as from an air conditioner or an opened window, weather permitting.  Wash your hands often Wash your hands often and thoroughly with soap and water for at least 20 seconds. You can use an alcohol based hand sanitizer if soap and water are not available and if your hands are not visibly dirty. Avoid touching your eyes, nose, and mouth with unwashed hands. Use disposable paper towels to dry your hands. If not available, use dedicated cloth towels and replace them when they become wet.  Wear a facemask and gloves Wear a disposable facemask  at all times in the room and gloves when you touch or have contact with the patient's blood, body fluids, and/or secretions or excretions, such as sweat, saliva, sputum, nasal mucus, vomit, urine, or feces.  Ensure the mask fits over your nose and mouth tightly, and do not touch it during use. Throw out disposable facemasks and gloves after using them. Do not reuse. Wash your hands immediately after removing your facemask and gloves. If your personal clothing becomes contaminated, carefully remove clothing and launder. Wash your hands after handling contaminated clothing. Place all used disposable facemasks, gloves, and other waste in a lined  container before disposing them with other household waste. Remove gloves and wash your hands immediately after handling these items.  Do not share dishes, glasses, or other household items with the patient Avoid sharing household items. You should not share dishes, drinking glasses, cups, eating utensils, towels, bedding, or other items with a patient who is confirmed to have, or being evaluated for, COVID-19 infection. After the person uses these items, you should wash them thoroughly with soap and water.  Wash laundry thoroughly Immediately remove and wash clothes or bedding that have blood, body fluids, and/or secretions or excretions, such as sweat, saliva, sputum, nasal mucus, vomit, urine, or feces, on them. Wear gloves when handling laundry from the patient. Read and follow directions on labels of laundry or clothing items and detergent. In general, wash and dry with the warmest temperatures recommended on the label.  Clean all areas the individual has used often Clean all touchable surfaces, such as counters, tabletops, doorknobs, bathroom fixtures, toilets, phones, keyboards, tablets, and bedside tables, every day. Also, clean any surfaces that may have blood, body fluids, and/or secretions or excretions on them. Wear gloves when cleaning surfaces the patient has come in contact with. Use a diluted bleach solution (e.g., dilute bleach with 1 part bleach and 10 parts water) or a household disinfectant with a label that says EPA-registered for coronaviruses. To make a bleach solution at home, add 1 tablespoon of bleach to 1 quart (4 cups) of water. For a larger supply, add  cup of bleach to 1 gallon (16 cups) of water. Read labels of cleaning products and follow recommendations provided on product labels. Labels contain instructions for safe and effective use of the cleaning product including precautions you should take when applying the product, such as wearing gloves or eye protection and  making sure you have good ventilation during use of the product. Remove gloves and wash hands immediately after cleaning.  Monitor yourself for signs and symptoms of illness Caregivers and household members are considered close contacts, should monitor their health, and will be asked to limit movement outside of the home to the extent possible. Follow the monitoring steps for close contacts listed on the symptom monitoring form.   If you have additional questions, contact your local health department or call the epidemiologist on call at [phone] (available 24/7). This guidance is subject to change. For the most up-to-date guidance from [facility], please refer to their website: [email or web address]   Increase activity slowly   Complete by: As directed    MyChart COVID-19 home monitoring program   Complete by: Feb 13, 2019    Is the patient willing to use the [facility] for home monitoring?: Yes   Temperature monitoring   Complete by: Feb 13, 2019    After how many days would you like to receive a notification of this patient's flowsheet entries?: 1  Allergies as of 02/13/2019      Reactions   Ace Inhibitors Other (See Comments)   Unknown   Ibuprofen Other (See Comments)   GI upset   Invokana [canagliflozin] Other (See Comments)   Unknown   Metformin And Related Diarrhea   Quinidine Other (See Comments)   Unknown      Medication List    STOP taking these medications   meloxicam 15 MG tablet Commonly known as: MOBIC     TAKE these medications   aspirin EC 81 MG tablet Take 81 mg by mouth daily.   atenolol 100 MG tablet Commonly known as: TENORMIN Take 1 tablet Daily for BP   B-D 3CC LUER-LOK SYR 21GX1" 21G X 1" 3 ML Misc Generic drug: SYRINGE-NEEDLE (DISP) 3 ML use as directed   Blood Glucose Monitoring Suppl Devi Use meter three times a day to check blood sugar   Cinnamon 500 MG capsule Take 1,000  mg by mouth daily.   dexamethasone 6 MG tablet Commonly known as: Decadron Take 1 tablet (6 mg total) by mouth daily for 6 days. Start taking on: February 14, 2019   glucose blood test strip Test blood sugar three times a day   glyBURIDE 5 MG tablet Commonly known as: DIABETA Take 1/2 to 1 tablet 3 x /day before meals as directed for Diabetes What changed:   how much to take  how to take this  when to take this  additional instructions   hydrochlorothiazide 25 MG tablet Commonly known as: HYDRODIURIL Take 1 Tablet Daily for BP & Fluid   Janumet 50-500 MG tablet Generic drug: sitaGLIPtin-metformin Take 1 tablet 2 x /day with a meal for Diabetes What changed:   how much to take  how to take this  when to take this   Lancets Misc Check blood sugars up to three times daily   losartan 100 MG tablet Commonly known as: COZAAR Take 1 tablet Daily for BP   Magnesium 500 MG Tabs Take 0.5 tablets (250 mg total) by mouth 3 (three) times daily. What changed:   how much to take  when to take this   meclizine 25 MG tablet Commonly known as: ANTIVERT Take 25 mg by mouth 3 (three) times daily as needed for dizziness.   ondansetron 8 MG tablet Commonly known as: Zofran Take 1/2 to 1 tablet 3 x day if needed for Nausea What changed:   how much to take  how to take this  when to take this  reasons to take this  additional instructions   tamsulosin 0.4 MG Caps capsule Commonly known as: FLOMAX TAKE 1 CAPSULE(0.4 MG) BY MOUTH DAILY What changed: See the new instructions.   TURMERIC PO Take 1 capsule by mouth daily.   vitamin C 500 MG tablet Commonly known as: ASCORBIC ACID Take 500 mg by mouth 2 (two) times daily.   Vitamin D3 50 MCG (2000 UT) Tabs Take 6,000 Units by mouth daily.   zinc gluconate 50 MG tablet Take 50 mg by mouth daily.      Follow-up Information    Unk Pinto, MD. Schedule an appointment as soon as possible for a visit in 1  week(s).   Specialty: Internal Medicine Contact information: [address] [place] [address] [phone]          Allergies  Allergen Reactions  . Ace Inhibitors Other (See Comments)    Unknown  . Ibuprofen Other (See Comments)    GI upset  .  Invokana [Canagliflozin] Other (See Comments)    Unknown  . Metformin And Related Diarrhea  . Quinidine Other (See Comments)    Unknown    Consultations:  None  Procedures/Studies: Dg Chest Portable 1 View  Result Date: 02/11/2019 CLINICAL DATA:  Cough and fever, positive COVID-19 testing EXAM: PORTABLE CHEST 1 VIEW COMPARISON:  08/19/2012 FINDINGS: Cardiac shadow is within normal limits. The lungs are well aerated bilaterally. Some minimal atelectatic changes are noted in the bases. No focal confluent infiltrate is seen. Aortic calcifications are noted. No bony abnormality is seen. IMPRESSION: Minimal bibasilar atelectasis. Electronically Signed   By: Inez Catalina M.D.   On: 02/11/2019 21:43      Subjective: Feels much better than admission, ate >75% of dinner and solid breakfast this morning. PT felt he would be very safe functionally to go home. No pulmonary complaints. Wants to go home.  Discharge Exam: Vitals:   02/13/19 0455 02/13/19 0710  BP: 130/65 131/69  Pulse:  69  Resp:    Temp: 98.2 F (36.8 C) 98.5 F (36.9 C)  SpO2: 97% 93%   General: Pt is alert, awake, not in acute distress Cardiovascular: RRR, S1/S2 +, no rubs, no gallops Respiratory: CTA bilaterally, no wheezing, no rhonchi Abdominal: Soft, NT, ND, bowel sounds + Extremities: No edema, no cyanosis  Labs: BNP (last 3 results) No results for input(s): BNP in the last 8760 hours. Basic Metabolic Panel: Recent Labs  Lab 02/11/19 2321 02/13/19 0300  NA 134* 138  K 3.8 4.4  CL 95* 102  CO2 27 27  GLUCOSE 201* 125*  BUN 33* 23  CREATININE 1.75* 1.28*  CALCIUM 8.5* 8.3*  MG 1.9 1.8  PHOS  --  2.2*   Liver Function  Tests: Recent Labs  Lab 02/11/19 2321 02/13/19 0300  AST 30 24  ALT 26 21  ALKPHOS 55 49  BILITOT 0.7 0.5  PROT 7.0 6.0*  ALBUMIN 3.4* 2.8*   Recent Labs  Lab 02/11/19 2321  LIPASE 42   No results for input(s): AMMONIA in the last 168 hours. CBC: Recent Labs  Lab 02/11/19 2321 02/13/19 0300  WBC 6.2 5.8  NEUTROABS 4.4 3.8  HGB 13.7 12.2*  HCT 41.4 37.9*  MCV 82.5 84.8  PLT 188 188   Cardiac Enzymes: Recent Labs  Lab 02/13/19 0300  CKTOTAL 35*   BNP: Invalid input(s): POCBNP CBG: Recent Labs  Lab 02/12/19 1203 02/12/19 1837 02/12/19 2110 02/13/19 0744  GLUCAP 190* 185* 266* 151*   D-Dimer Recent Labs    02/12/19 0925  DDIMER 0.64*   Hgb A1c No results for input(s): HGBA1C in the last 72 hours. Lipid Profile No results for input(s): CHOL, HDL, LDLCALC, TRIG, CHOLHDL, LDLDIRECT in the last 72 hours. Thyroid function studies No results for input(s): TSH, T4TOTAL, T3FREE, THYROIDAB in the last 72 hours.  Invalid input(s): FREET3 Anemia work up Recent Labs    02/12/19 0925  FERRITIN 1,968*   Urinalysis    Component Value Date/Time   COLORURINE YELLOW 10/14/2018 [place] 10/14/2018 0951   LABSPEC 1.021 10/14/2018 0951   PHURINE < OR = 5.0 10/14/2018 0951   GLUCOSEU NEGATIVE 10/14/2018 0951   HGBUR NEGATIVE 10/14/2018 [place] 08/07/2016 1217   KETONESUR NEGATIVE 10/14/2018 0951   PROTEINUR NEGATIVE 10/14/2018 0951   NITRITE NEGATIVE 10/14/2018 0951   LEUKOCYTESUR NEGATIVE 10/14/2018 0951    Microbiology Recent Results (from the past 240 hour(s))  Novel Coronavirus, NAA ([facility])  Drive  up testing site only     Status: Abnormal   Collection Time: 02/06/19 12:41 PM  Result Value Ref Range Status   SARS-CoV-2, NAA Detected (A) Not Detected Final    Comment: This test was developed and its performance characteristics determined by [employer]. This test has not been FDA cleared or approved.  This test has been authorized by FDA under an Emergency Use Authorization (EUA). This test is only authorized for the duration of time the declaration that circumstances exist justifying the authorization of the emergency use of in vitro diagnostic tests for detection of SARS-CoV-2 virus and/or diagnosis of COVID-19 infection under section 564(b)(1) of the Act, 21 U.S.C. 207KTC-2(E)(8), unless the authorization is terminated or revoked sooner. When diagnostic testing is negative, the possibility of a false negative result should be considered in the context of a patient's recent exposures and the presence of clinical signs and symptoms consistent with COVID-19. An individual without symptoms of COVID-19 and who is not shedding SARS-CoV-2 virus would expect to have a negative (not detected) result in this assay.   SARS Coronavirus 2 (CEPHEID- Performed in [place] hospital lab), Hosp Order     Status: Abnormal   Collection Time: 02/12/19  1:29 AM   Specimen: Nasopharyngeal Swab  Result Value Ref Range Status   SARS Coronavirus 2 POSITIVE (A) NEGATIVE Final    Comment: RESULT CALLED TO, READ BACK BY AND VERIFIED WITH: WICKER,K @ 0424 ON [id number] BY POTEAT,S Performed at [facility], [place] [address], [place], [place] [address]     Time coordinating discharge: Approximately 40 minutes  Patrecia Pour, MD  [facility] 02/13/2019, 10:30 AM

## 2019-02-13 NOTE — Discharge Instructions (Signed)

## 2019-02-18 ENCOUNTER — Telehealth: Payer: Self-pay | Admitting: *Deleted

## 2019-02-18 NOTE — Telephone Encounter (Signed)
Called patient on 02/18/2019 , 8:51 AM in an attempt to reach the patient for a hospital follow up.   Admit date: 02/11/19 Discharge: 02/13/19   He does not have any questions or concerns about medications from the hospital admission. The patient's medications were reviewed over the phone, they were counseled to bring in all current medications to the hospital follow up visit.   I advised the patient to call if any questions or concerns arise about the hospital admission or medications    Home health was not started in the hospital.  All questions were answered and a follow up appointment was made. Patient has been advised to continue to self-quarantine for 2 to 3 more weeks.  Per Dr Melford Aase, it is ok for patient to keep 03/03/2019 appointment with Caryl Pina Corbett,NP to have hospital follow up and labs.  Prior to Admission medications   Medication Sig Start Date End Date Taking? Authorizing Provider  aspirin EC 81 MG tablet Take 81 mg by mouth daily.    [provider]  atenolol (TENORMIN) 100 MG tablet Take 1 tablet Daily for BP 11/13/18   Unk Pinto, MD  B-D 3CC LUER-LOK SYR 21GX1" 21G X 1" 3 ML MISC use as directed 10/17/16   Unk Pinto, MD  Blood Glucose Monitoring Suppl DEVI Use meter three times a day to check blood sugar 05/20/18   Liane Comber, NP  Cholecalciferol (VITAMIN D3) 2000 units TABS Take 6,000 Units by mouth daily.     [provider]  Cinnamon 500 MG capsule Take 1,000 mg by mouth daily.     [provider]  dexamethasone (DECADRON) 6 MG tablet Take 1 tablet (6 mg total) by mouth daily for 6 days. 02/14/19 02/20/19  Patrecia Pour, MD  glucose blood test strip Test blood sugar three times a day 05/20/18   Liane Comber, NP  glyBURIDE (DIABETA) 5 MG tablet Take 1/2 to 1 tablet 3 x /day before meals as directed for Diabetes Patient taking differently: Take 5 mg by mouth 3 (three) times daily. 1 tablet 3 x /day before meals as directed for  Diabetes 06/12/18   Unk Pinto, MD  hydrochlorothiazide (HYDRODIURIL) 25 MG tablet Take 1 Tablet Daily for BP & Fluid 10/24/18   Unk Pinto, MD  Lancets MISC Check blood sugars up to three times daily 05/20/18   Liane Comber, NP  losartan (COZAAR) 100 MG tablet Take 1 tablet Daily for BP 11/13/18   Unk Pinto, MD  Magnesium 500 MG TABS Take 0.5 tablets (250 mg total) by mouth 3 (three) times daily. Patient taking differently: Take 500 mg by mouth daily.  05/21/18   Liane Comber, NP  meclizine (ANTIVERT) 25 MG tablet Take 25 mg by mouth 3 (three) times daily as needed for dizziness.    [provider]  ondansetron (ZOFRAN) 8 MG tablet Take 1/2 to 1 tablet 3 x day if needed for Nausea Patient taking differently: Take 8 mg by mouth every 8 (eight) hours as needed for nausea or vomiting.  02/08/19   Unk Pinto, MD  sitaGLIPtin-metformin (JANUMET) 50-500 MG tablet Take 1 tablet 2 x /day with a meal for Diabetes Patient taking differently: Take 1 tablet by mouth 2 (two) times daily with a meal. Take 1 tablet 2 x /day with a meal for Diabetes 01/05/19   Unk Pinto, MD  tamsulosin (FLOMAX) 0.4 MG CAPS capsule TAKE 1 CAPSULE(0.4 MG) BY MOUTH DAILY Patient taking differently: Take 0.4 mg by mouth daily as  needed (urination).  09/11/18   Unk Pinto, MD  TURMERIC PO Take 1 capsule by mouth daily.    [provider]  vitamin C (ASCORBIC ACID) 500 MG tablet Take 500 mg by mouth 2 (two) times daily.    [provider]  zinc gluconate 50 MG tablet Take 50 mg by mouth daily.    [provider]

## 2019-02-27 ENCOUNTER — Encounter: Payer: Self-pay | Admitting: Adult Health

## 2019-02-27 ENCOUNTER — Other Ambulatory Visit: Payer: Self-pay | Admitting: Internal Medicine

## 2019-02-27 MED ORDER — MELOXICAM 15 MG PO TABS: ORAL_TABLET | ORAL | 1 refills | Status: DC

## 2019-02-27 NOTE — Progress Notes (Signed)
FOLLOW UP  Assessment and Plan:   Hypertension Needs to take meds prior to coming in for appointment; he will return for recheck in 2 days Take all meds daily at consistent time  Monitor blood pressure at home at least a few days a week; instructed patient to call if consistently greater than 130/80 Continue DASH diet.    Reminder to go to the ER if any CP, SOB, nausea, dizziness, severe HA, changes vision/speech, left arm numbness and tingling and jaw pain.  Cholesterol Not currently on treatment; has been on previously and had joint aches; declines statin or other medication  LDL is not at goal of <70 but reasonable at <100;  Encouraged low cholesterol diet and exercise.  Check lipid panel.   Diabetes with diabetic chronic kidney disease Continue medications - janumet, glipizide- TID  Emphasized need to check fasting glucose  Continue diet and exercise.  Perform daily foot/skin check, notify office of any concerning changes.  Check A1C  Stage 3 CKD Increase fluids, avoid NSAIDS, monitor sugars, will monitor  Hypogonadism On zinc 50 mg daily; no longer on testosterone due to polycythemia  Obesity with co morbidities Long discussion about weight loss, diet, and exercise Discussed ideal weight for height and weight goal for next follow up (225lb lb)  He is poorly motivated at this time to address lifestyle  Has been off phentermine, has been declining to restart Will follow up in 3 months  Vitamin D Def Hasn't increased dose; advised to increase to 8000 IU Continue supplementation for goal 60-100 Defer Vit D level  Covid 19 infection/ /N/V/D/AKI Recheck CBC, CMP/GFR  Continue diet and meds as discussed. Further disposition pending results of labs. Discussed med's effects and SE's.   Over 30 minutes of exam, counseling, chart review, and critical decision making was performed.   Future Appointments  Date Time Provider [place]  04/28/2019  9:30 AM Unk Pinto, MD [facility] None  11/14/2019 10:00 AM Unk Pinto, MD [facility] None    ----------------------------------------------------------------------------------------------------------------------  HPI 73 y.o. male  presents for 3 month follow up on hypertension, cholesterol, diabetes, obesity and vitamin D deficiency.   He was admitted for nausea/vomiting/diarrhea and dehydration from 7/21-23 after testing + for novel covid 19 virus on 02/06/2019. He had AKI which improved to baseline after aggressive IV hydration. He was discharged on 7 day taper of prednisone due to elevated CRP. CXR was negative. He is due for CBC, CMP recheck for follow up.   Today he reports some residual weakness, persistent loss of taste/smell, he will be presenting for covid 19 recheck testing today to be cleared to return to work.   Component     Latest Ref Rng & Units 10/14/2018 02/11/2019 02/13/2019  Glucose     70 - 99 mg/dL 183 (H) 201 (H) 125 (H)  BUN     8 - 23 mg/dL 25 33 (H) 23  Creatinine     0.61 - 1.24 mg/dL 1.23 (H) 1.75 (H) 1.28 (H)  GFR, Est Non African American     >60 mL/min 59 (L) 38 (L) 56 (L)  GFR, Est African American     >60 mL/min 68 44 (L) >60  BUN/Creatinine Ratio     6 - 22 (calc) 20    Sodium     135 - 145 mmol/L 138 134 (L) 138  Potassium     3.5 - 5.1 mmol/L 4.2 3.8 4.4  Chloride     98 - 111 mmol/L  100 95 (L) 102  CO2     22 - 32 mmol/L 29 27 27   Calcium     8.9 - 10.3 mg/dL 9.9 8.5 (L) 8.3 (L)  Total Protein     6.5 - 8.1 g/dL 6.4 7.0 6.0 (L)  Albumin MSPROF     3.6 - 5.1 g/dL 4.1    Globulin     1.9 - 3.7 g/dL (calc) 2.3    AG Ratio     1.0 - 2.5 (calc) 1.8    Total Bilirubin     0.3 - 1.2 mg/dL 0.6 0.7 0.5  Alkaline phosphatase (APISO)     35 - 144 U/L 72    AST     15 - 41 U/L 15 30 24   ALT     0 - 44 U/L 16 26 21   Albumin     3.5 - 5.0 g/dL  3.4 (L) 2.8 (L)  Alkaline Phosphatase     38 - 126 U/L  55 49  Anion gap     5 - 15  12 9     BMI is  Body mass index is 34.44 kg/m., he has not been working on lifestyle other than watching portions. Drinks 4-5 bottles of water daily. He is poorly motivated to work on lifestyle and is resigned to "dying of diabetes" despite numerous long conversations. He reports appetite is down due to recent infection.  Wt Readings from Last 3 Encounters:  03/03/19 233 lb 3.2 oz (105.8 kg)  02/11/19 230 lb (104.3 kg)  10/14/18 238 lb 9.6 oz (108.2 kg)   He does not check his BP at home, today their BP is BP: (!) 166/86 He reports hasn't taken AM meds today as hasn't eaten.   He does not workout. He denies chest pain, shortness of breath, dizziness.   He is not on cholesterol medication and denies myalgias; he reports history of statin intolerance with arthralgias and declines other medication. His cholesterol is not at goal of LDL <70, though LDL does remain WNL. The cholesterol last visit was:   Lab Results  Component Value Date   CHOL 171 10/14/2018   HDL 32 (L) 10/14/2018   LDLCALC 96 10/14/2018   TRIG 319 (H) 10/14/2018   CHOLHDL 5.3 (H) 10/14/2018    He has not been working on diet, not exercising at all - for diabetes (on janumet 50-500 BID - cannot tolerate higher dose of metformin, glyburide 5 mg BID to TID - with each meal), and denies hyperglycemia, hypoglycemia , increased appetite, nausea, paresthesia of the feet, polydipsia, polyuria, visual disturbances and vomiting. He does not check fasting glucose, machine broke, per patient insurance company is currently coordinating for a new machine. Last A1C in the office was:  Lab Results  Component Value Date   HGBA1C 8.4 (H) 10/14/2018   Patient is on Vitamin D supplement but below goal of 60:  Lab Results  Component Value Date   VD25OH 43 10/14/2018    Currently taking 6000 IU, hasn't increased dose since last visit.   He has a history of testosterone deficiency but is off of testosterone injections due to polycythemia and is now on zinc 50  mg daily supplementation.  Lab Results  Component Value Date   TESTOSTERONE 293 09/12/2017     Current Medications:  Current Outpatient Medications on File Prior to Visit  Medication Sig  . aspirin EC 81 MG tablet Take 81 mg by mouth daily.  Marland Kitchen atenolol (TENORMIN) 100 MG  tablet Take 1 tablet Daily for BP  . B-D 3CC LUER-LOK SYR 21GX1" 21G X 1" 3 ML MISC use as directed  . Blood Glucose Monitoring Suppl DEVI Use meter three times a day to check blood sugar  . Cholecalciferol (VITAMIN D3) 2000 units TABS Take 6,000 Units by mouth daily.   . Cinnamon 500 MG capsule Take 1,000 mg by mouth daily.   Marland Kitchen glucose blood test strip Test blood sugar three times a day  . glyBURIDE (DIABETA) 5 MG tablet Take 1/2 to 1 tablet 3 x /day before meals as directed for Diabetes (Patient taking differently: Take 5 mg by mouth 3 (three) times daily. 1 tablet 3 x /day before meals as directed for Diabetes)  . hydrochlorothiazide (HYDRODIURIL) 25 MG tablet Take 1 Tablet Daily for BP & Fluid  . Lancets MISC Check blood sugars up to three times daily  . losartan (COZAAR) 100 MG tablet Take 1 tablet Daily for BP  . Magnesium 500 MG TABS Take 0.5 tablets (250 mg total) by mouth 3 (three) times daily. (Patient taking differently: Take 500 mg by mouth daily. )  . meloxicam (MOBIC) 15 MG tablet Take 1/2 to 1 tablet Daily with food for Pain & Inflammation - limit to 4 -5 days /week to Avoid Kidney Damage  . sitaGLIPtin-metformin (JANUMET) 50-500 MG tablet Take 1 tablet 2 x /day with a meal for Diabetes (Patient taking differently: Take 1 tablet by mouth 2 (two) times daily with a meal. Take 1 tablet 2 x /day with a meal for Diabetes)  . tamsulosin (FLOMAX) 0.4 MG CAPS capsule TAKE 1 CAPSULE(0.4 MG) BY MOUTH DAILY (Patient taking differently: Take 0.4 mg by mouth daily as needed (urination). )  . TURMERIC PO Take 1 capsule by mouth daily.  . vitamin C (ASCORBIC ACID) 500 MG tablet Take 500 mg by mouth 2 (two) times daily.  Marland Kitchen  zinc gluconate 50 MG tablet Take 50 mg by mouth daily.  . meclizine (ANTIVERT) 25 MG tablet Take 25 mg by mouth 3 (three) times daily as needed for dizziness.  . ondansetron (ZOFRAN) 8 MG tablet Take 1/2 to 1 tablet 3 x day if needed for Nausea (Patient not taking: Reported on 03/03/2019)   No current facility-administered medications on file prior to visit.      Allergies:  Allergies  Allergen Reactions  . Ace Inhibitors Other (See Comments)    Unknown  . Ibuprofen Other (See Comments)    GI upset  . Invokana [Canagliflozin] Other (See Comments)    Unknown  . Metformin And Related Diarrhea  . Quinidine Other (See Comments)    Unknown     Medical History:  Past Medical History:  Diagnosis Date  .  Covid-19 Positive (02/06/2019)  02/11/2019  . BPH (benign prostatic hypertrophy)   . Diabetes mellitus without complication ([place])   . GERD (gastroesophageal reflux disease)   . Headache    PMH  . Hypertension   . Hypogonadism male   . Mass    left shoulder AC joint  . OSA (obstructive sleep apnea)    wears CPAP  . PONV (postoperative nausea and vomiting)   . Sarcoidosis   . Vitamin D deficiency    Family history- Reviewed and unchanged Social history- Reviewed and unchanged   Review of Systems:  Review of Systems  Constitutional: Negative for malaise/fatigue and weight loss.  HENT: Negative for hearing loss and tinnitus.   Eyes: Negative for blurred vision and double vision.  Respiratory:  Negative for cough, shortness of breath and wheezing.   Cardiovascular: Negative for chest pain, palpitations, orthopnea, claudication and leg swelling.  Gastrointestinal: Negative for abdominal pain, blood in stool, constipation, diarrhea, heartburn, melena, nausea and vomiting.  Genitourinary: Negative.   Musculoskeletal: Positive for joint pain (bilateral knees, needs TKA). Negative for myalgias.  Skin: Negative for rash.  Neurological: Negative for dizziness, tingling, sensory  change, weakness and headaches.  Endo/Heme/Allergies: Negative for polydipsia.  Psychiatric/Behavioral: Negative.   All other systems reviewed and are negative.   Physical Exam: BP (!) 166/86   Pulse 81   Temp (!) 97.3 F (36.3 C)   Wt 233 lb 3.2 oz (105.8 kg)   SpO2 97%   BMI 34.44 kg/m  Wt Readings from Last 3 Encounters:  03/03/19 233 lb 3.2 oz (105.8 kg)  02/11/19 230 lb (104.3 kg)  10/14/18 238 lb 9.6 oz (108.2 kg)   General Appearance: Well nourished, in no apparent distress. Eyes: PERRLA, EOMs, conjunctiva no swelling or erythema Sinuses: No Frontal/maxillary tenderness ENT/Mouth: Ext aud canals clear, TMs without erythema, bulging. No erythema, swelling, or exudate on post pharynx.  Tonsils not swollen or erythematous. Hearing normal.  Neck: Supple, thyroid normal.  Respiratory: Respiratory effort normal, BS equal bilaterally without rales, rhonchi, wheezing or stridor.  Cardio: RRR with no MRGs. Brisk peripheral pulses without edema.  Abdomen: Soft obese abdomen, + BS.  Non tender, no guarding, rebound, hernias, masses. Lymphatics: Non tender without lymphadenopathy.  Musculoskeletal: Full ROM, except bilateral knees with obvious bony deformity, valgus, 5/5 strength,antalgic gait Skin: Warm, dry without rashes, lesions, ecchymosis.  Neuro: Cranial nerves intact. No cerebellar symptoms.  Psych: Awake and oriented X 3, normal affect, Insight and Judgment appropriate.     Phillip Ribas, NP 8:53 AM [facility]

## 2019-03-03 ENCOUNTER — Encounter: Payer: Self-pay | Admitting: Adult Health

## 2019-03-03 ENCOUNTER — Ambulatory Visit: Payer: Managed Care, Other (non HMO) | Admitting: Adult Health

## 2019-03-03 ENCOUNTER — Other Ambulatory Visit: Payer: Self-pay

## 2019-03-03 ENCOUNTER — Telehealth: Payer: Self-pay

## 2019-03-03 VITALS — BP 166/86 | HR 81 | Temp 97.3°F | Wt 233.2 lb

## 2019-03-03 DIAGNOSIS — I10 Essential (primary) hypertension: Secondary | ICD-10-CM | POA: Diagnosis not present

## 2019-03-03 DIAGNOSIS — Z79899 Other long term (current) drug therapy: Secondary | ICD-10-CM

## 2019-03-03 DIAGNOSIS — E1122 Type 2 diabetes mellitus with diabetic chronic kidney disease: Secondary | ICD-10-CM

## 2019-03-03 DIAGNOSIS — E785 Hyperlipidemia, unspecified: Secondary | ICD-10-CM

## 2019-03-03 DIAGNOSIS — Z20822 Contact with and (suspected) exposure to covid-19: Secondary | ICD-10-CM

## 2019-03-03 DIAGNOSIS — N182 Chronic kidney disease, stage 2 (mild): Secondary | ICD-10-CM

## 2019-03-03 DIAGNOSIS — E1169 Type 2 diabetes mellitus with other specified complication: Secondary | ICD-10-CM

## 2019-03-03 DIAGNOSIS — U071 COVID-19: Secondary | ICD-10-CM

## 2019-03-03 DIAGNOSIS — E66811 Obesity, class 1: Secondary | ICD-10-CM

## 2019-03-03 DIAGNOSIS — N183 Chronic kidney disease, stage 3 unspecified: Secondary | ICD-10-CM

## 2019-03-03 DIAGNOSIS — E559 Vitamin D deficiency, unspecified: Secondary | ICD-10-CM | POA: Diagnosis not present

## 2019-03-03 DIAGNOSIS — E669 Obesity, unspecified: Secondary | ICD-10-CM

## 2019-03-03 DIAGNOSIS — J988 Other specified respiratory disorders: Secondary | ICD-10-CM

## 2019-03-03 MED ORDER — ONETOUCH ULTRASOFT LANCETS MISC: 12 refills | Status: AC

## 2019-03-03 MED ORDER — ONETOUCH VERIO W/DEVICE KIT: PACK | 0 refills | Status: AC

## 2019-03-03 MED ORDER — ONETOUCH VERIO VI STRP: ORAL_STRIP | 12 refills | Status: DC

## 2019-03-03 NOTE — Telephone Encounter (Signed)
Prescription request for One Touch Verio Glucometer, lancets and test strips.

## 2019-03-03 NOTE — Patient Instructions (Addendum)
Goals    . Blood Pressure < 130/80    . HEMOGLOBIN A1C < 7.0    . LDL CALC < 70    . Weight (lb) < 230 lb (104.3 kg)      Need to check blood pressure at home at least a few times a week  As soon as you have a glucose machine need to start checking fasting sugars daily and keep a log to bring next visit - if any problems getting or using the machine don't wait until next visit - please call   Goal fasting glucose is <130      HYPERTENSION INFORMATION  Monitor your blood pressure at home, please keep a record and bring that in with you to your next office visit.   Go to the ER if any CP, SOB, nausea, dizziness, severe HA, changes vision/speech  Testing/Procedures: HOW TO TAKE YOUR BLOOD PRESSURE:  Rest 5 minutes before taking your blood pressure.  Don't smoke or drink caffeinated beverages for at least 30 minutes before.  Take your blood pressure before (not after) you eat.  Sit comfortably with your back supported and both feet on the floor (don't cross your legs).  Elevate your arm to heart level on a table or a desk.  Use the proper sized cuff. It should fit smoothly and snugly around your bare upper arm. There should be enough room to slip a fingertip under the cuff. The bottom edge of the cuff should be 1 inch above the crease of the elbow.  Due to a recent study, SPRINT, we have changed our goal for the systolic or top blood pressure number. Ideally we want your top number at 120.  In the [facility] Trial, 5000 people were randomized to a goal BP of 120 and 5000 people were randomized to a goal BP of less than 140. The patients with the goal BP at 120 had LESS DEMENTIA, LESS HEART ATTACKS, AND LESS STROKES, AS WELL AS OVERALL DECREASED MORTALITY OR DEATH RATE.   There was another study that showed taking your blood pressure medications at night decrease cardiovascular events.  However if you are on a fluid pill, please take this in the morning.    Your most recent BP:  BP: (!) 166/86   Take your medications faithfully as instructed. Maintain a healthy weight. Get at least 150 minutes of aerobic exercise per week. Minimize salt intake. Minimize alcohol intake     Diabetic Neuropathy Diabetic neuropathy refers to nerve damage that is caused by diabetes (diabetes mellitus). Over time, people with diabetes can develop nerve damage throughout the body. There are several types of diabetic neuropathy:  Peripheral neuropathy. This is the most common type of diabetic neuropathy. It causes damage to nerves that carry signals between the spinal cord and other parts of the body (peripheral nerves). This usually affects nerves in the feet and legs first, and may eventually affect the hands and arms. The damage affects the ability to sense touch or temperature.  Autonomic neuropathy. This type causes damage to nerves that control involuntary functions (autonomic nerves). These nerves carry signals that control: ? Heartbeat. ? Body temperature. ? Blood pressure. ? Urination. ? Digestion. ? Sweating. ? Sexual function. ? Response to changing blood sugar (glucose) levels.  Focal neuropathy. This type of nerve damage affects one area of the body, such as an arm, a leg, or the face. The injury may involve one nerve or a small group of nerves. Focal neuropathy can be  painful and unpredictable, and occurs most often in older adults with diabetes. This often develops suddenly, but usually improves over time and does not cause long-term problems.  Proximal neuropathy. This type of nerve damage affects the nerves of the thighs, hips, buttocks, or legs. It causes severe pain, weakness, and muscle death (atrophy), usually in the thigh muscles. It is more common among older men and people who have type 2 diabetes. The length of recovery time may vary. What are the causes? Peripheral, autonomic, and focal neuropathies are caused by diabetes that is not well controlled with  treatment. The cause of proximal neuropathy is not known, but it may be caused by inflammation related to uncontrolled blood glucose levels. What are the signs or symptoms? Peripheral neuropathy Peripheral neuropathy develops slowly over time. When the nerves of the feet and legs no longer work, you may experience:  Burning, stabbing, or aching pain in the legs or feet.  Pain or cramping in the legs or feet.  Loss of feeling (numbness) and inability to feel pressure or pain in the feet. This can lead to: ? Thick calluses or sores on areas of constant pressure. ? Ulcers. ? Reduced ability to feel temperature changes.  Foot deformities.  Muscle weakness.  Loss of balance or coordination. Autonomic neuropathy The symptoms of autonomic neuropathy vary depending on which nerves are affected. Symptoms may include:  Problems with digestion, such as: ? Nausea or vomiting. ? Poor appetite. ? Bloating. ? Diarrhea or constipation. ? Trouble swallowing. ? Losing weight without trying to.  Problems with the heart, blood and lungs, such as: ? Dizziness, especially when standing up. ? Fainting. ? Shortness of breath. ? Irregular heartbeat.  Bladder problems, such as: ? Trouble starting or stopping urination. ? Leaking urine. ? Trouble emptying the bladder. ? Urinary tract infections (UTIs).  Problems with other body functions, such as: ? Sweat. You may sweat too much or too little. ? Temperature. You might get hot easily. Or, you might feel cold more than usual. ? Sexual function. Men may not be able to get or maintain an erection. Women may have vaginal dryness and difficulty with arousal. Focal neuropathy Symptoms affect only one area of the body. Common symptoms include:  Numbness.  Tingling.  Burning pain.  Prickling feeling.  Very sensitive skin.  Weakness.  Inability to move (paralysis).  Muscle twitching.  Muscles getting smaller (wasting).  Poor  coordination.  Double or blurred vision. Proximal neuropathy  Sudden, severe pain in the hip, thigh, or buttocks. Pain may spread from the back into the legs (sciatica).  Pain and numbness in the arms and legs.  Tingling.  Loss of bladder control or bowel control.  Weakness and wasting of thigh muscles.  Difficulty getting up from a seated position.  Abdominal swelling.  Unexplained weight loss. How is this diagnosed? Diagnosis usually involves reviewing your medical history and any symptoms you have. Diagnosis varies depending on the type of neuropathy your health care provider suspects. Peripheral neuropathy Your health care provider will check areas that are affected by your nervous system (neurologic exam), such as your reflexes, how you move, and what you can feel. You may have other tests, such as:  Blood tests.  Removal and examination of fluid that surrounds the spinal cord (lumbar puncture).  CT scan.  MRI.  A test to check the nerves that control muscles (electromyogram, EMG).  Tests of how quickly messages pass through your nerves (nerve conduction velocity tests).  Removal of  a small piece of nerve to be examined under a microscope (biopsy). Autonomic neuropathy You may have tests, such as:  Tests to measure your blood pressure and heart rate. This may include monitoring you while you are safely secured to an exam table that moves you from a lying position to an upright position (table tilt test).  Breathing tests to check your lungs.  Tests to check how food moves through the digestive system (gastric emptying tests).  Blood, sweat, or urine tests.  Ultrasound of your bladder.  Spinal fluid tests. Focal neuropathy This condition may be diagnosed with:  A neurologic exam.  CT scan.  MRI.  EMG.  Nerve conduction velocity tests. Proximal neuropathy There is no test to diagnose this type of neuropathy. You may have tests to rule out other  possible causes of this type of neuropathy. Tests may include:  X-rays of your spine and lumbar region.  Lumbar puncture.  MRI. How is this treated? The goal of treatment is to keep nerve damage from getting worse. The most important part of treatment is keeping your blood glucose level and your A1C level within your target range by following your diabetes management plan. Over time, maintaining lower blood glucose levels helps lessen symptoms. In some cases, you may need prescription pain medicine. Follow these instructions at home:  Lifestyle   Do not use any products that contain nicotine or tobacco, such as cigarettes and e-cigarettes. If you need help quitting, ask your health care provider.  Be physically active every day. Include strength training and balance exercises.  Follow a healthy meal plan.  Work with your health care provider to manage your blood pressure. General instructions  Follow your diabetes management plan as directed. ? Check your blood glucose levels as directed by your health care provider. ? Keep your blood glucose in your target range as directed by your health care provider. ? Have your A1C level checked at least two times a year, or as often as told by your health care provider.  Take over the counter and prescription medicines only as told by your health care provider. This includes insulin and diabetes medicine.  Do not drive or use heavy machinery while taking prescription pain medicines.  Check your skin and feet every day for cuts, bruises, redness, blisters, or sores.  Keep all follow up visits as told by your health care provider. This is important. Contact a health care provider if:  You have burning, stabbing, or aching pain in your legs or feet.  You are unable to feel pressure or pain in your feet.  You develop problems with digestion, such as: ? Nausea. ? Vomiting. ? Bloating. ? Constipation. ? Diarrhea. ? Abdominal  pain.  You have difficulty with urination, such as inability: ? To control when you urinate (incontinence). ? To completely empty the bladder (retention).  You have palpitations.  You feel dizzy, weak, or faint when you stand up. Get help right away if:  You cannot urinate.  You have sudden weakness or loss of coordination.  You have trouble speaking.  You have pain or pressure in your chest.  You have an irregular heart beat.  You have sudden inability to move a part of your body. Summary  Diabetic neuropathy refers to nerve damage that is caused by diabetes. It can affect nerves throughout the entire body, causing numbness and pain in the arms, legs, digestive tract, heart, and other body systems.  Keep your blood glucose level and your  blood pressure in your target range, as directed by your health care provider. This can help prevent neuropathy from getting worse.  Check your skin and feet every day for cuts, bruises, redness, blisters, or sores.  Do not use any products that contain nicotine or tobacco, such as cigarettes and e-cigarettes. If you need help quitting, ask your health care provider. This information is not intended to replace advice given to you by your health care provider. Make sure you discuss any questions you have with your health care provider. Document Released: 09/18/2001 Document Revised: 08/22/2017 Document Reviewed: 08/14/2016 Elsevier Patient Education  2020 [employer].

## 2019-03-04 LAB — CBC WITH DIFFERENTIAL/PLATELET
Absolute Monocytes: 418 cells/uL (ref 200–950)
Basophils Absolute: 41 cells/uL (ref 0–200)
Basophils Relative: 0.8 %
Eosinophils Absolute: 117 cells/uL (ref 15–500)
Eosinophils Relative: 2.3 %
HCT: 39.6 % (ref 38.5–50.0)
Hemoglobin: 12.9 g/dL — ABNORMAL LOW (ref 13.2–17.1)
Lymphs Abs: 1000 cells/uL (ref 850–3900)
MCH: 27 pg (ref 27.0–33.0)
MCHC: 32.6 g/dL (ref 32.0–36.0)
MCV: 82.8 fL (ref 80.0–100.0)
MPV: 9.7 fL (ref 7.5–12.5)
Monocytes Relative: 8.2 %
Neutro Abs: 3524 cells/uL (ref 1500–7800)
Neutrophils Relative %: 69.1 %
Platelets: 161 10*3/uL (ref 140–400)
RBC: 4.78 10*6/uL (ref 4.20–5.80)
RDW: 14.2 % (ref 11.0–15.0)
Total Lymphocyte: 19.6 %
WBC: 5.1 10*3/uL (ref 3.8–10.8)

## 2019-03-04 LAB — COMPLETE METABOLIC PANEL WITH GFR
AG Ratio: 1.5 (calc) (ref 1.0–2.5)
ALT: 31 U/L (ref 9–46)
AST: 22 U/L (ref 10–35)
Albumin: 3.7 g/dL (ref 3.6–5.1)
Alkaline phosphatase (APISO): 60 U/L (ref 35–144)
BUN: 21 mg/dL (ref 7–25)
CO2: 31 mmol/L (ref 20–32)
Calcium: 9.5 mg/dL (ref 8.6–10.3)
Chloride: 98 mmol/L (ref 98–110)
Creat: 1.13 mg/dL (ref 0.70–1.18)
GFR, Est African American: 75 mL/min/{1.73_m2} (ref >=60)
GFR, Est Non African American: 65 mL/min/{1.73_m2} (ref >=60)
Globulin: 2.5 g/dL (calc) (ref 1.9–3.7)
Glucose, Bld: 231 mg/dL — ABNORMAL HIGH (ref 65–99)
Potassium: 4.8 mmol/L (ref 3.5–5.3)
Sodium: 137 mmol/L (ref 135–146)
Total Bilirubin: 0.7 mg/dL (ref 0.2–1.2)
Total Protein: 6.2 g/dL (ref 6.1–8.1)

## 2019-03-04 LAB — NOVEL CORONAVIRUS, NAA: SARS-CoV-2, NAA: NOT DETECTED

## 2019-03-04 LAB — HEMOGLOBIN A1C
Hgb A1c MFr Bld: 9.3 % of total Hgb — ABNORMAL HIGH (ref <5.7)
Mean Plasma Glucose: 220 (calc)
eAG (mmol/L): 12.2 (calc)

## 2019-03-04 LAB — LIPID PANEL
Cholesterol: 164 mg/dL (ref <200)
HDL: 36 mg/dL — ABNORMAL LOW (ref >=40)
LDL Cholesterol (Calc): 100 mg/dL (calc) — ABNORMAL HIGH
Non-HDL Cholesterol (Calc): 128 mg/dL (calc) (ref <130)
Total CHOL/HDL Ratio: 4.6 (calc) (ref <5.0)
Triglycerides: 185 mg/dL — ABNORMAL HIGH (ref <150)

## 2019-03-04 LAB — MAGNESIUM: Magnesium: 1.6 mg/dL (ref 1.5–2.5)

## 2019-03-04 LAB — TSH: TSH: 1.86 mIU/L (ref 0.40–4.50)

## 2019-03-05 ENCOUNTER — Other Ambulatory Visit: Payer: Self-pay

## 2019-03-05 ENCOUNTER — Ambulatory Visit (INDEPENDENT_AMBULATORY_CARE_PROVIDER_SITE_OTHER): Payer: Managed Care, Other (non HMO)

## 2019-03-05 DIAGNOSIS — I10 Essential (primary) hypertension: Secondary | ICD-10-CM

## 2019-03-05 NOTE — Progress Notes (Signed)
Presents to the office for a nurse visit to have blood pressure checked. Patient BP today was 142/74. Provider aware and instructed patient to make sure to take BP medication consistently and monitor at home regularly. Vitals taken and recorded.

## 2019-03-06 ENCOUNTER — Ambulatory Visit: Payer: Managed Care, Other (non HMO) | Admitting: Physician Assistant

## 2019-03-24 ENCOUNTER — Other Ambulatory Visit: Payer: Self-pay | Admitting: Internal Medicine

## 2019-04-01 ENCOUNTER — Ambulatory Visit: Payer: Managed Care, Other (non HMO) | Admitting: Adult Health Nurse Practitioner

## 2019-04-08 ENCOUNTER — Ambulatory Visit: Payer: Managed Care, Other (non HMO) | Admitting: Adult Health Nurse Practitioner

## 2019-04-10 ENCOUNTER — Other Ambulatory Visit: Payer: Self-pay | Admitting: Internal Medicine

## 2019-04-10 DIAGNOSIS — U071 COVID-19: Secondary | ICD-10-CM

## 2019-04-10 DIAGNOSIS — R0602 Shortness of breath: Secondary | ICD-10-CM

## 2019-04-10 DIAGNOSIS — R05 Cough: Secondary | ICD-10-CM

## 2019-04-10 DIAGNOSIS — R059 Cough, unspecified: Secondary | ICD-10-CM

## 2019-04-14 ENCOUNTER — Ambulatory Visit
Admission: RE | Admit: 2019-04-14 | Discharge: 2019-04-14 | Disposition: A | Discharge status: Home / Self Care | Payer: Managed Care, Other (non HMO) | Source: Ambulatory Visit | Attending: Internal Medicine | Admitting: Internal Medicine

## 2019-04-14 ENCOUNTER — Other Ambulatory Visit: Payer: Self-pay

## 2019-04-14 DIAGNOSIS — R059 Cough, unspecified: Secondary | ICD-10-CM

## 2019-04-14 DIAGNOSIS — R05 Cough: Secondary | ICD-10-CM

## 2019-04-14 DIAGNOSIS — R0602 Shortness of breath: Secondary | ICD-10-CM

## 2019-04-14 DIAGNOSIS — U071 COVID-19: Secondary | ICD-10-CM

## 2019-04-28 ENCOUNTER — Ambulatory Visit: Payer: Managed Care, Other (non HMO) | Admitting: Internal Medicine

## 2019-04-28 LAB — HM DIABETES EYE EXAM

## 2019-05-19 ENCOUNTER — Ambulatory Visit: Payer: Managed Care, Other (non HMO) | Admitting: Internal Medicine

## 2019-05-20 ENCOUNTER — Encounter: Payer: Self-pay | Admitting: *Deleted

## 2019-06-15 ENCOUNTER — Other Ambulatory Visit: Payer: Self-pay | Admitting: Internal Medicine

## 2019-06-15 DIAGNOSIS — N138 Other obstructive and reflux uropathy: Secondary | ICD-10-CM

## 2019-06-15 MED ORDER — TAMSULOSIN HCL 0.4 MG PO CAPS: ORAL_CAPSULE | ORAL | 1 refills | Status: DC

## 2019-06-27 NOTE — Progress Notes (Signed)
FOLLOW UP  Assessment and Plan:   Back pain with 1 episode of dark urine Negative straight leg exam in the office + right CVA tenderness Has slowly improved, will check urine, if not better will get KUB xray or CT AB for kidney stone, continue Flomax and tylenol.  The patient was advised to call immediately if he has any concerning symptoms in the interval. The patient voices understanding of current treatment options and is in agreement with the current care plan.The patient knows to call the clinic with any problems, questions or concerns or go to the ER if any further progression of symptoms.   Hypertension Take all meds daily at consistent time  Monitor blood pressure at home at least a few days a week; instructed patient to call if consistently greater than 130/80 Continue DASH diet.    Reminder to go to the ER if any CP, SOB, nausea, dizziness, severe HA, changes vision/speech, left arm numbness and tingling and jaw pain.  Cholesterol Not currently on treatment; has been on previously and had joint aches; declines statin or other medication, understands not at goal and risk of MI/Stroke  LDL is not at goal of <70 but reasonable at <100;  Encouraged low cholesterol diet and exercise.  Check lipid panel.   Diabetes with diabetic chronic kidney disease Continue medications - janumet, glipizide- TID  Emphasized need to check fasting glucose  Continue diet and exercise.  Discussed heart health, denies any CAD symptoms at this time Perform daily foot/skin check, notify office of any concerning changes.  Check A1C  Stage 3 CKD Increase fluids, avoid NSAIDS, monitor sugars, will monitor  Hypogonadism On zinc 50 mg daily; no longer on testosterone due to polycythemia  Obesity with co morbidities Disccussed about weight loss, diet, and exercise Only drinks water and 1 diet soda a day Try to make small changes to help with weight loss, information given Will follow up in 3  months  Vitamin D Def Continue supplementation for goal 60-100  Continue diet and meds as discussed. Further disposition pending results of labs. Discussed med's effects and SE's.   Over 30 minutes of exam, counseling, chart review, and critical decision making was performed.   Future Appointments  Date Time Provider [place]  11/14/2019 10:00 AM Unk Pinto, MD [facility] None    ----------------------------------------------------------------------------------------------------------------------  HPI 73 y.o. male  presents for 3 month follow up on hypertension, cholesterol, diabetes, obesity and vitamin D deficiency.   Had + COVID in July. Still some fatigue from this.   He states he has been having lower back pain x 2-3 weeks, has history of herniated disc, no known injury. Was not able to lay on right side, worse climbing into his truck. Walk with cane. He did have an episode of very dark urine, has history of kidney stones.  Patient denies fever, incontinence, numbness, tingling, weakness and saddle anesthesia    BMI is Body mass index is 34.41 kg/m. He is poorly motivated to work on lifestyle, states still some fatigue from [place] which makes it difficult. .  Wt Readings from Last 3 Encounters:  06/30/19 233 lb (105.7 kg)  03/05/19 233 lb (105.7 kg)  03/03/19 233 lb 3.2 oz (105.8 kg)   He does not check his BP at home, today their BP is BP: 140/80   He does not workout. He denies chest pain, shortness of breath, dizziness.   He is not on cholesterol medication and denies myalgias; he reports history of  statin intolerance with arthralgias and declines other medication. His cholesterol is not at goal of LDL <70, though LDL does remain WNL. The cholesterol last visit was:   Lab Results  Component Value Date   CHOL 164 03/03/2019   HDL 36 (L) 03/03/2019   LDLCALC 100 (H) 03/03/2019   TRIG 185 (H) 03/03/2019   CHOLHDL 4.6 03/03/2019    He has not been working on  diet, not exercising at all - for diabetes (on janumet 50-500 BID - cannot tolerate higher dose of metformin, glyburide 5 mg BID to TID - with each meal), and denies hyperglycemia, hypoglycemia , increased appetite, nausea, paresthesia of the feet, polydipsia, polyuria, visual disturbances and vomiting.  He does not check fasting glucose, has a machine but not using it.   Last A1C in the office was:  Lab Results  Component Value Date   HGBA1C 9.3 (H) 03/03/2019   Patient is on Vitamin D supplement but below goal of 60:  Lab Results  Component Value Date   VD25OH 43 10/14/2018   He has a history of testosterone deficiency but is off of testosterone injections due to polycythemia and is now on zinc 50 mg daily supplementation.  Lab Results  Component Value Date   TESTOSTERONE 293 09/12/2017     Current Medications:  Current Outpatient Medications on File Prior to Visit  Medication Sig  . aspirin EC 81 MG tablet Take 81 mg by mouth daily.  Marland Kitchen atenolol (TENORMIN) 100 MG tablet Take 1 tablet Daily for BP  . B-D 3CC LUER-LOK SYR 21GX1" 21G X 1" 3 ML MISC use as directed  . Blood Glucose Monitoring Suppl (ONETOUCH VERIO) w/Device KIT Test blood sugar once daily.  . Blood Glucose Monitoring Suppl DEVI Use meter three times a day to check blood sugar  . Cholecalciferol (VITAMIN D3) 2000 units TABS Take 8,000 Units by mouth daily.  . Cinnamon 500 MG capsule Take 1,000 mg by mouth daily.   Marland Kitchen glucose blood (ONETOUCH VERIO) test strip Test blood sugar once daily  . glucose blood test strip Test blood sugar three times a day  . glyBURIDE (DIABETA) 5 MG tablet TAKE 1/2 TO 1 TABLET BY MOUTH THREE TIMES DAILY BEFORE MEALS AS DIRECTED FOR DIABETES  . hydrochlorothiazide (HYDRODIURIL) 25 MG tablet Take 1 Tablet Daily for BP & Fluid  . Lancets (ONETOUCH ULTRASOFT) lancets Test blood sugar once daily  . losartan (COZAAR) 100 MG tablet Take 1 tablet Daily for BP  . Magnesium 500 MG TABS Take 0.5 tablets  (250 mg total) by mouth 3 (three) times daily. (Patient taking differently: Take 500 mg by mouth daily. )  . meclizine (ANTIVERT) 25 MG tablet Take 25 mg by mouth 3 (three) times daily as needed for dizziness.  . meloxicam (MOBIC) 15 MG tablet Take 1/2 to 1 tablet Daily with food for Pain & Inflammation - limit to 4 -5 days /week to Avoid Kidney Damage  . ondansetron (ZOFRAN) 8 MG tablet Take 1/2 to 1 tablet 3 x day if needed for Nausea  . sitaGLIPtin-metformin (JANUMET) 50-500 MG tablet Take 1 tablet 2 x /day with a meal for Diabetes (Patient taking differently: Take 1 tablet by mouth 2 (two) times daily with a meal. Take 1 tablet 2 x /day with a meal for Diabetes)  . tamsulosin (FLOMAX) 0.4 MG CAPS capsule Take 1 tablet Daily for Prostate  . TURMERIC PO Take 1 capsule by mouth daily.  . vitamin C (ASCORBIC ACID) 500 MG  tablet Take 500 mg by mouth 2 (two) times daily.  Marland Kitchen zinc gluconate 50 MG tablet Take 50 mg by mouth daily.   No current facility-administered medications on file prior to visit.      Allergies:  Allergies  Allergen Reactions  . Ace Inhibitors Other (See Comments)    Unknown  . Ibuprofen Other (See Comments)    GI upset  . Invokana [Canagliflozin] Other (See Comments)    Unknown  . Metformin And Related Diarrhea  . Quinidine Other (See Comments)    Unknown     Medical History:  Past Medical History:  Diagnosis Date  .  Covid-19 Positive (02/06/2019)  02/11/2019  . BPH (benign prostatic hypertrophy)   . Diabetes mellitus without complication ([place])   . GERD (gastroesophageal reflux disease)   . Headache    PMH  . Hypertension   . Hypogonadism male   . Mass    left shoulder AC joint  . OSA (obstructive sleep apnea)    wears CPAP  . PONV (postoperative nausea and vomiting)   . Sarcoidosis   . Vitamin D deficiency    Family history- Reviewed and unchanged Social history- Reviewed and unchanged   Review of Systems:  Review of Systems  Constitutional:  Negative for malaise/fatigue and weight loss.  HENT: Negative for hearing loss and tinnitus.   Eyes: Negative for blurred vision and double vision.  Respiratory: Negative for cough, shortness of breath and wheezing.   Cardiovascular: Negative for chest pain, palpitations, orthopnea, claudication and leg swelling.  Gastrointestinal: Negative for abdominal pain, blood in stool, constipation, diarrhea, heartburn, melena, nausea and vomiting.  Genitourinary: Positive for flank pain and hematuria.  Musculoskeletal: Positive for back pain and joint pain (bilateral knees, needs TKA). Negative for myalgias.  Skin: Negative for rash.  Neurological: Negative for dizziness, tingling, sensory change, weakness and headaches.  Endo/Heme/Allergies: Negative for polydipsia.  Psychiatric/Behavioral: Negative.   All other systems reviewed and are negative.   Physical Exam: BP 140/80   Pulse 77   Temp (!) 97.5 F (36.4 C)   Wt 233 lb (105.7 kg)   SpO2 96%   BMI 34.41 kg/m  Wt Readings from Last 3 Encounters:  06/30/19 233 lb (105.7 kg)  03/05/19 233 lb (105.7 kg)  03/03/19 233 lb 3.2 oz (105.8 kg)   General Appearance: Well nourished, in no apparent distress. Eyes: PERRLA, EOMs, conjunctiva no swelling or erythema Sinuses: No Frontal/maxillary tenderness ENT/Mouth: Ext aud canals clear, TMs without erythema, bulging. No erythema, swelling, or exudate on post pharynx.  Tonsils not swollen or erythematous. Hearing normal.  Neck: Supple, thyroid normal.  Respiratory: Respiratory effort normal, BS equal bilaterally without rales, rhonchi, wheezing or stridor.  Cardio: RRR with no MRGs. Brisk peripheral pulses without edema.  Abdomen: Soft obese abdomen, + BS.  Non tender, no guarding, rebound, hernias, masses. + right CVA tenderness Lymphatics: Non tender without lymphadenopathy.  Musculoskeletal: Full ROM,  5/5 strength,antalgic gait walking with a cane, negative straight leg raise bilaterally. No  midline lower back tenderness to palpation, no paraspinous tenderness, and no rash.  Skin: Warm, dry without rashes, lesions, ecchymosis.  Neuro: Cranial nerves intact. No cerebellar symptoms.  Psych: Awake and oriented X 3, normal affect, Insight and Judgment appropriate.     Vicie Mutters, PA-C 9:21 AM [facility]

## 2019-06-30 ENCOUNTER — Encounter: Payer: Self-pay | Admitting: Physician Assistant

## 2019-06-30 ENCOUNTER — Ambulatory Visit: Payer: Managed Care, Other (non HMO) | Admitting: Internal Medicine

## 2019-06-30 ENCOUNTER — Ambulatory Visit: Payer: Managed Care, Other (non HMO) | Admitting: Physician Assistant

## 2019-06-30 ENCOUNTER — Other Ambulatory Visit: Payer: Self-pay

## 2019-06-30 VITALS — BP 140/80 | HR 77 | Temp 97.5°F | Wt 233.0 lb

## 2019-06-30 DIAGNOSIS — E785 Hyperlipidemia, unspecified: Secondary | ICD-10-CM | POA: Diagnosis not present

## 2019-06-30 DIAGNOSIS — R310 Gross hematuria: Secondary | ICD-10-CM

## 2019-06-30 DIAGNOSIS — E1122 Type 2 diabetes mellitus with diabetic chronic kidney disease: Secondary | ICD-10-CM | POA: Diagnosis not present

## 2019-06-30 DIAGNOSIS — Z79899 Other long term (current) drug therapy: Secondary | ICD-10-CM | POA: Diagnosis not present

## 2019-06-30 DIAGNOSIS — I10 Essential (primary) hypertension: Secondary | ICD-10-CM

## 2019-06-30 DIAGNOSIS — N183 Chronic kidney disease, stage 3 unspecified: Secondary | ICD-10-CM

## 2019-06-30 DIAGNOSIS — E66811 Obesity, class 1: Secondary | ICD-10-CM

## 2019-06-30 DIAGNOSIS — E559 Vitamin D deficiency, unspecified: Secondary | ICD-10-CM

## 2019-06-30 DIAGNOSIS — E669 Obesity, unspecified: Secondary | ICD-10-CM | POA: Diagnosis not present

## 2019-06-30 DIAGNOSIS — M545 Low back pain, unspecified: Secondary | ICD-10-CM

## 2019-06-30 DIAGNOSIS — E1169 Type 2 diabetes mellitus with other specified complication: Secondary | ICD-10-CM | POA: Diagnosis not present

## 2019-06-30 NOTE — Patient Instructions (Signed)
Diabetes or even increased sugars put you at 300% increased risk of heart attack and stroke.  ALSO BEING DIABETIC YOU MAY NOT HAVE ANY PAIN WITH A HEART ATTACK.  Even worse of a chance of no pain if you are a woman.  It is very unlikely that you will have any pain with a heart attack. Likely your symptoms will be very subtle, even for very severe disease.  Your symptoms for a heart attack will likely occur when you exert your self or exercise and include: Shortness of breath Sweating Nausea Dizziness Fast or irregular heart beats Fatigue   It makes me feel better if my diabetics get their heart rate up with exercise once or twice a week and pay close attention to your body. If there is ANY change in your exercise capacity or if you have symptoms above, please STOP and call 911 or call to come to the office.   PLEASE REMEMBER:  Diabetes is preventable! Up to 56 percent of complications and morbidities among individuals with type 2 diabetes can be prevented, delayed, or effectively treated and minimized with regular visits to a health professional, appropriate monitoring and medication, and a healthy diet and lifestyle.   Here is some information to help you keep your heart healthy: Move it! - Aim for 30 mins of activity every day. Take it slowly at first. Talk to [place] before starting any new exercise program.   Lose it.  -Body Mass Index (BMI) can indicate if you need to lose weight. A healthy range is 18.5-24.9. For a BMI calculator, go to [employer]  Waist Management -Excess abdominal fat is a risk factor for heart disease, diabetes, asthma, stroke and more. Ideal waist circumference is less than 35" for women and less than 40" for men.   Eat Right -focus on fruits, vegetables, whole grains, and meals you make yourself. Avoid foods with trans fat and high sugar/sodium content.   Snooze or Snore? - Loud snoring can be a sign of sleep apnea, a significant risk factor for high blood  pressure, heart attach, stroke, and heart arrhythmias.  Kick the habit -Quit Smoking! Avoid second hand smoke. A single cigarette raises your blood pressure for 20 mins and increases the risk of heart attack and stroke for the next 24 hours.   Are Aspirin and Supplements right for you? -Add ENTERIC COATED low dose 81 mg Aspirin daily OR can do every other day if you have easy bruising to protect your heart and head. As well as to reduce risk of Colon Cancer by 20 %, Skin Cancer by 26 % , Melanoma by 46% and Pancreatic cancer by 60%  Say "No to Stress -There may be little you can do about problems that cause stress. However, techniques such as long walks, meditation, and exercise can help you manage it.   Start Now! - Make changes one at a time and set reasonable goals to increase your likelihood of success.    General eating tips  What to Avoid . Avoid added sugars o Often added sugar can be found in processed foods such as many condiments, dry cereals, cakes, cookies, chips, crisps, crackers, candies, sweetened drinks, etc.  o Read labels and AVOID/DECREASE use of foods with the following in their ingredient list: Sugar, fructose, high fructose corn syrup, sucrose, glucose, maltose, dextrose, molasses, cane sugar, brown sugar, any type of syrup, agave nectar, etc.   . Avoid snacking in between meals- drink water or if you feel you need  a snack, pick a high water content snack such as cucumbers, watermelon, or any veggie.  Marland Kitchen Avoid foods made with flour o If you are going to eat food made with flour, choose those made with whole-grains; and, minimize your consumption as much as is tolerable . Avoid processed foods o These foods are generally stocked in the middle of the grocery store.  o Focus on shopping on the perimeter of the grocery.  What to Include . Vegetables o GREEN LEAFY VEGETABLES: Kale, spinach, mustard greens, collard greens, cabbage, broccoli, etc. o OTHER: Asparagus,  cauliflower, eggplant, carrots, peas, Brussel sprouts, tomatoes, bell peppers, zucchini, beets, cucumbers, etc. . Grains, seeds, and legumes o Beans: kidney beans, black eyed peas, garbanzo beans, black beans, pinto beans, etc. o Whole, unrefined grains: brown rice, barley, bulgur, oatmeal, etc. . Healthy fats  o Avoid highly processed fats such as vegetable oil o Examples of healthy fats: avocado, olives, virgin olive oil, dark chocolate (?72% Cocoa), nuts (peanuts, almonds, walnuts, cashews, pecans, etc.) o Please still do small amount of these healthy fats, they are dense in calories.  . Low - Moderate Intake of Animal Sources of Protein o Meat sources: chicken, [place], salmon, tuna. Limit to 4 ounces of meat at one time or the size of your palm. o Consider limiting dairy sources, but when choosing dairy focus on: PLAIN [place] yogurt, cottage cheese, high-protein milk . Fruit o Choose berries

## 2019-07-01 ENCOUNTER — Other Ambulatory Visit: Payer: Self-pay | Admitting: *Deleted

## 2019-07-01 LAB — LIPID PANEL
Cholesterol: 164 mg/dL (ref <200)
HDL: 32 mg/dL — ABNORMAL LOW (ref >=40)
Non-HDL Cholesterol (Calc): 132 mg/dL (calc) — ABNORMAL HIGH (ref <130)
Total CHOL/HDL Ratio: 5.1 (calc) — ABNORMAL HIGH (ref <5.0)
Triglycerides: 602 mg/dL — ABNORMAL HIGH (ref <150)

## 2019-07-01 LAB — CBC WITH DIFFERENTIAL/PLATELET
Absolute Monocytes: 518 cells/uL (ref 200–950)
Basophils Absolute: 70 cells/uL (ref 0–200)
Basophils Relative: 1 %
Eosinophils Absolute: 196 cells/uL (ref 15–500)
Eosinophils Relative: 2.8 %
HCT: 43.9 % (ref 38.5–50.0)
Hemoglobin: 15.1 g/dL (ref 13.2–17.1)
Lymphs Abs: 2247 cells/uL (ref 850–3900)
MCH: 28.2 pg (ref 27.0–33.0)
MCHC: 34.4 g/dL (ref 32.0–36.0)
MCV: 82.1 fL (ref 80.0–100.0)
MPV: 10 fL (ref 7.5–12.5)
Monocytes Relative: 7.4 %
Neutro Abs: 3969 cells/uL (ref 1500–7800)
Neutrophils Relative %: 56.7 %
Platelets: 195 10*3/uL (ref 140–400)
RBC: 5.35 10*6/uL (ref 4.20–5.80)
RDW: 13.7 % (ref 11.0–15.0)
Total Lymphocyte: 32.1 %
WBC: 7 10*3/uL (ref 3.8–10.8)

## 2019-07-01 LAB — URINALYSIS, ROUTINE W REFLEX MICROSCOPIC
Bilirubin Urine: NEGATIVE
Glucose, UA: NEGATIVE
Hgb urine dipstick: NEGATIVE
Ketones, ur: NEGATIVE
Leukocytes,Ua: NEGATIVE
Nitrite: NEGATIVE
Protein, ur: NEGATIVE
Specific Gravity, Urine: 1.019 (ref 1.001–1.03)
pH: <=5 (ref 5.0–8.0)

## 2019-07-01 LAB — COMPLETE METABOLIC PANEL WITH GFR
AG Ratio: 1.9 (calc) (ref 1.0–2.5)
ALT: 21 U/L (ref 9–46)
AST: 20 U/L (ref 10–35)
Albumin: 4.1 g/dL (ref 3.6–5.1)
Alkaline phosphatase (APISO): 80 U/L (ref 35–144)
BUN: 22 mg/dL (ref 7–25)
CO2: 28 mmol/L (ref 20–32)
Calcium: 9.8 mg/dL (ref 8.6–10.3)
Chloride: 101 mmol/L (ref 98–110)
Creat: 1.13 mg/dL (ref 0.70–1.18)
GFR, Est African American: 75 mL/min/{1.73_m2} (ref >=60)
GFR, Est Non African American: 65 mL/min/{1.73_m2} (ref >=60)
Globulin: 2.2 g/dL (calc) (ref 1.9–3.7)
Glucose, Bld: 189 mg/dL — ABNORMAL HIGH (ref 65–99)
Potassium: 4.5 mmol/L (ref 3.5–5.3)
Sodium: 140 mmol/L (ref 135–146)
Total Bilirubin: 0.6 mg/dL (ref 0.2–1.2)
Total Protein: 6.3 g/dL (ref 6.1–8.1)

## 2019-07-01 LAB — URINE CULTURE
MICRO NUMBER:: 1171313
Result:: NO GROWTH
SPECIMEN QUALITY:: ADEQUATE

## 2019-07-01 LAB — VITAMIN D 25 HYDROXY (VIT D DEFICIENCY, FRACTURES): Vit D, 25-Hydroxy: 33 ng/mL (ref 30–100)

## 2019-07-01 LAB — HEMOGLOBIN A1C
Hgb A1c MFr Bld: 9.4 % of total Hgb — ABNORMAL HIGH (ref <5.7)
Mean Plasma Glucose: 223 (calc)
eAG (mmol/L): 12.4 (calc)

## 2019-07-01 LAB — TSH: TSH: 1.77 mIU/L (ref 0.40–4.50)

## 2019-07-01 LAB — MAGNESIUM: Magnesium: 1.8 mg/dL (ref 1.5–2.5)

## 2019-07-01 MED ORDER — LOSARTAN POTASSIUM 100 MG PO TABS: ORAL_TABLET | ORAL | 1 refills | Status: DC

## 2019-07-01 MED ORDER — ATENOLOL 100 MG PO TABS: ORAL_TABLET | ORAL | 1 refills | Status: DC

## 2019-09-01 ENCOUNTER — Other Ambulatory Visit: Payer: Self-pay | Admitting: Internal Medicine

## 2019-09-01 MED ORDER — MELOXICAM 15 MG PO TABS: ORAL_TABLET | ORAL | 1 refills | Status: DC

## 2019-09-08 ENCOUNTER — Encounter: Payer: Self-pay | Admitting: Adult Health

## 2019-09-08 ENCOUNTER — Other Ambulatory Visit: Payer: Self-pay

## 2019-09-08 ENCOUNTER — Ambulatory Visit: Payer: Managed Care, Other (non HMO) | Admitting: Adult Health

## 2019-09-08 VITALS — BP 164/92 | HR 71 | Temp 97.7°F

## 2019-09-08 DIAGNOSIS — M62830 Muscle spasm of back: Secondary | ICD-10-CM | POA: Diagnosis not present

## 2019-09-08 DIAGNOSIS — M545 Low back pain, unspecified: Secondary | ICD-10-CM

## 2019-09-08 MED ORDER — TIZANIDINE HCL 4 MG PO TABS: 4.0000 mg | ORAL_TABLET | Freq: Three times a day (TID) | ORAL | 0 refills | Status: DC | PRN

## 2019-09-08 NOTE — Patient Instructions (Signed)
Try magnesium cream rubs at night or epsom salt baths  Try stretching, consider medical massage for back   Wet heat is beneficial for relaxing muscles   Zanaflex muscle relaxer as needed every 8 hours, don't drive while     Tizanidine tablets or capsules What is this medicine? TIZANIDINE (tye ZAN i deen) helps to relieve muscle spasms. It may be used to help in the treatment of multiple sclerosis and spinal cord injury. This medicine may be used for other purposes; ask your health care provider or pharmacist if you have questions. COMMON BRAND NAME(S): Zanaflex What should I tell my health care provider before I take this medicine? They need to know if you have any of these conditions:  kidney disease  liver disease  low blood pressure  mental disorder  an unusual or allergic reaction to tizanidine, other medicines, lactose (tablets only), foods, dyes, or preservatives  pregnant or trying to get pregnant  breast-feeding How should I use this medicine? Take this medicine by mouth with a full glass of water. Take this medicine on an empty stomach, at least 30 minutes before or 2 hours after food. Do not take with food unless you talk with your doctor. Follow the directions on the prescription label. Take your medicine at regular intervals. Do not take your medicine more often than directed. Do not stop taking except on your doctor's advice. Suddenly stopping the medicine can be very dangerous. Talk to your pediatrician regarding the use of this medicine in children. Patients over 20 years old may have a stronger reaction and need a smaller dose. Overdosage: If you think you have taken too much of this medicine contact a poison control center or emergency room at once. NOTE: This medicine is only for you. Do not share this medicine with others. What if I miss a dose? If you miss a dose, take it as soon as you can. If it is almost time for your next dose, take only that dose. Do  not take double or extra doses. What may interact with this medicine? Do not take this medicine with any of the following medications:  ciprofloxacin  fluvoxamine  narcotic medicines for cough  thiabendazole This medicine may also interact with the following medications:  acyclovir  alcohol  antihistamines for allergy, cough, and cold  baclofen  certain medicines for anxiety or sleep  certain medicines for blood pressure, heart disease, irregular heartbeat  certain medicines for depression like amitriptyline, fluoxetine, sertraline  certain medicines for seizures like phenobarbital, primidone  certain medicines for stomach problems like cimetidine, famotidine  male hormones, like estrogens or progestins and birth control pills, patches, rings, or injections  general anesthetics like halothane, isoflurane, methoxyflurane, propofol  local anesthetics like lidocaine, pramoxine, tetracaine  medicines that relax muscles for surgery  narcotic medicines for pain  phenothiazines like chlorpromazine, mesoridazine, prochlorperazine  ticlopidine  zileuton This list may not describe all possible interactions. Give your health care provider a list of all the medicines, herbs, non-prescription drugs, or dietary supplements you use. Also tell them if you smoke, drink alcohol, or use illegal drugs. Some items may interact with your medicine. What should I watch for while using this medicine? Tell your doctor or health care professional if your symptoms do not start to get better or if they get worse. You may get drowsy or dizzy. Do not drive, use machinery, or do anything that needs mental alertness until you know how this medicine affects you. Do not  stand or sit up quickly, especially if you are an older patient. This reduces the risk of dizzy or fainting spells. Alcohol may interfere with the effect of this medicine. Avoid alcoholic drinks. If you are taking another medicine  that also causes drowsiness, you may have more side effects. Give your health care provider a list of all medicines you use. Your doctor will tell you how much medicine to take. Do not take more medicine than directed. Call emergency for help if you have problems breathing or unusual sleepiness. Your mouth may get dry. Chewing sugarless gum or sucking hard candy, and drinking plenty of water may help. Contact your doctor if the problem does not go away or is severe. What side effects may I notice from receiving this medicine? Side effects that you should report to your doctor or health care professional as soon as possible:  allergic reactions like skin rash, itching or hives, swelling of the face, lips, or tongue  breathing problems  hallucinations  signs and symptoms of liver injury like dark yellow or brown urine; general ill feeling or flu-like symptoms; light-colored stools; loss of appetite; nausea; right upper quadrant belly pain; unusually weak or tired; yellowing of the eyes or skin  signs and symptoms of low blood pressure like dizziness; feeling faint or lightheaded, falls; unusually weak or tired  unusually slow heartbeat  unusually weak or tired Side effects that usually do not require medical attention (report to your doctor or health care professional if they continue or are bothersome):  blurred vision  constipation  dizziness  dry mouth  tiredness This list may not describe all possible side effects. Call your doctor for medical advice about side effects. You may report side effects to FDA at 1-800-FDA-1088. Where should I keep my medicine? Keep out of the reach of children. Store at room temperature between 15 and 30 degrees C (59 and 86 degrees F). Throw away any unused medicine after the expiration date. NOTE: This sheet is a summary. It may not cover all possible information. If you have questions about this medicine, talk to your doctor, pharmacist, or health  care provider.  2020 Elsevier/Gold Standard (2017-04-24 13:33:29)    Low Back Sprain or Strain Rehab Ask your health care provider which exercises are safe for you. Do exercises exactly as told by your health care provider and adjust them as directed. It is normal to feel mild stretching, pulling, tightness, or discomfort as you do these exercises. Stop right away if you feel sudden pain or your pain gets worse. Do not begin these exercises until told by your health care provider. Stretching and range-of-motion exercises These exercises warm up your muscles and joints and improve the movement and flexibility of your back. These exercises also help to relieve pain, numbness, and tingling. Lumbar rotation  1. Lie on your back on a firm surface and bend your knees. 2. Straighten your arms out to your sides so each arm forms a 90-degree angle (right angle) with a side of your body. 3. Slowly move (rotate) both of your knees to one side of your body until you feel a stretch in your lower back (lumbar). Try not to let your shoulders lift off the floor. 4. Hold this position for __________ seconds. 5. Tense your abdominal muscles and slowly move your knees back to the starting position. 6. Repeat this exercise on the other side of your body. Repeat __________ times. Complete this exercise __________ times a day. Single knee to  chest  1. Lie on your back on a firm surface with both legs straight. 2. Bend one of your knees. Use your hands to move your knee up toward your chest until you feel a gentle stretch in your lower back and buttock. ? Hold your leg in this position by holding on to the front of your knee. ? Keep your other leg as straight as possible. 3. Hold this position for __________ seconds. 4. Slowly return to the starting position. 5. Repeat with your other leg. Repeat __________ times. Complete this exercise __________ times a day. Prone extension on elbows  1. Lie on your  abdomen on a firm surface (prone position). 2. Prop yourself up on your elbows. 3. Use your arms to help lift your chest up until you feel a gentle stretch in your abdomen and your lower back. ? This will place some of your body weight on your elbows. If this is uncomfortable, try stacking pillows under your chest. ? Your hips should stay down, against the surface that you are lying on. Keep your hip and back muscles relaxed. 4. Hold this position for __________ seconds. 5. Slowly relax your upper body and return to the starting position. Repeat __________ times. Complete this exercise __________ times a day. Strengthening exercises These exercises build strength and endurance in your back. Endurance is the ability to use your muscles for a long time, even after they get tired. Pelvic tilt This exercise strengthens the muscles that lie deep in the abdomen. 1. Lie on your back on a firm surface. Bend your knees and keep your feet flat on the floor. 2. Tense your abdominal muscles. Tip your pelvis up toward the ceiling and flatten your lower back into the floor. ? To help with this exercise, you may place a small towel under your lower back and try to push your back into the towel. 3. Hold this position for __________ seconds. 4. Let your muscles relax completely before you repeat this exercise. Repeat __________ times. Complete this exercise __________ times a day. Alternating arm and leg raises  1. Get on your hands and knees on a firm surface. If you are on a hard floor, you may want to use padding, such as an exercise mat, to cushion your knees. 2. Line up your arms and legs. Your hands should be directly below your shoulders, and your knees should be directly below your hips. 3. Lift your left leg behind you. At the same time, raise your right arm and straighten it in front of you. ? Do not lift your leg higher than your hip. ? Do not lift your arm higher than your shoulder. ? Keep your  abdominal and back muscles tight. ? Keep your hips facing the ground. ? Do not arch your back. ? Keep your balance carefully, and do not hold your breath. 4. Hold this position for __________ seconds. 5. Slowly return to the starting position. 6. Repeat with your right leg and your left arm. Repeat __________ times. Complete this exercise __________ times a day. Abdominal set with straight leg raise  1. Lie on your back on a firm surface. 2. Bend one of your knees and keep your other leg straight. 3. Tense your abdominal muscles and lift your straight leg up, 4-6 inches (10-15 cm) off the ground. 4. Keep your abdominal muscles tight and hold this position for __________ seconds. ? Do not hold your breath. ? Do not arch your back. Keep it flat against the  ground. 5. Keep your abdominal muscles tense as you slowly lower your leg back to the starting position. 6. Repeat with your other leg. Repeat __________ times. Complete this exercise __________ times a day. Single leg lower with bent knees 1. Lie on your back on a firm surface. 2. Tense your abdominal muscles and lift your feet off the floor, one foot at a time, so your knees and hips are bent in 90-degree angles (right angles). ? Your knees should be over your hips and your lower legs should be parallel to the floor. 3. Keeping your abdominal muscles tense and your knee bent, slowly lower one of your legs so your toe touches the ground. 4. Lift your leg back up to return to the starting position. ? Do not hold your breath. ? Do not let your back arch. Keep your back flat against the ground. 5. Repeat with your other leg. Repeat __________ times. Complete this exercise __________ times a day. Posture and body mechanics Good posture and healthy body mechanics can help to relieve stress in your body's tissues and joints. Body mechanics refers to the movements and positions of your body while you do your daily activities. Posture is part  of body mechanics. Good posture means:  Your spine is in its natural S-curve position (neutral).  Your shoulders are pulled back slightly.  Your head is not tipped forward. Follow these guidelines to improve your posture and body mechanics in your everyday activities. Standing   When standing, keep your spine neutral and your feet about hip width apart. Keep a slight bend in your knees. Your ears, shoulders, and hips should line up.  When you do a task in which you stand in one place for a long time, place one foot up on a stable object that is 2-4 inches (5-10 cm) high, such as a footstool. This helps keep your spine neutral. Sitting   When sitting, keep your spine neutral and keep your feet flat on the floor. Use a footrest, if necessary, and keep your thighs parallel to the floor. Avoid rounding your shoulders, and avoid tilting your head forward.  When working at a desk or a computer, keep your desk at a height where your hands are slightly lower than your elbows. Slide your chair under your desk so you are close enough to maintain good posture.  When working at a computer, place your monitor at a height where you are looking straight ahead and you do not have to tilt your head forward or downward to look at the screen. Resting  When lying down and resting, avoid positions that are most painful for you.  If you have pain with activities such as sitting, bending, stooping, or squatting, lie in a position in which your body does not bend very much. For example, avoid curling up on your side with your arms and knees near your chest (fetal position).  If you have pain with activities such as standing for a long time or reaching with your arms, lie with your spine in a neutral position and bend your knees slightly. Try the following positions: ? Lying on your side with a pillow between your knees. ? Lying on your back with a pillow under your knees. Lifting   When lifting objects,  keep your feet at least shoulder width apart and tighten your abdominal muscles.  Bend your knees and hips and keep your spine neutral. It is important to lift using the strength of your legs, not  your back. Do not lock your knees straight out.  Always ask for help to lift heavy or awkward objects. This information is not intended to replace advice given to you by your health care provider. Make sure you discuss any questions you have with your health care provider. Document Revised: 11/01/2018 Document Reviewed: 08/01/2018 Elsevier Patient Education  [place].

## 2019-09-08 NOTE — Progress Notes (Signed)
Assessment and Plan:  Phillip Powell was seen today for back pain.  Diagnoses and all orders for this visit:  Acute right-sided low back pain without sciatica Spasm of lumbar paraspinous muscle - negative straight leg, exam most consistent with lumbar muscle spasm Prednisone was not prescribed, continue NSAIDs (meloxicam), RICE, and exercise given If not better follow up in office or will refer to PT/orthopedics. Natural history and expected course discussed. Questions answered. Neurosurgeon distributed. Proper lifting, bending technique discussed. Stretching exercises discussed. Short (2-4 day) period of relative rest recommended until acute symptoms improve. Heat to affected area as needed for local pain relief. Muscle relaxants per medication orders. Discused local massage  Follow up if no improvement in 2 weeks or with any new or concerning symptoms -     tiZANidine (ZANAFLEX) 4 MG tablet; Take 1 tablet (4 mg total) by mouth every 8 (eight) hours as needed for muscle spasms. May cause drowsiness, do not take while driving or with alcohol.  Further disposition pending results of labs. Discussed med's effects and SE's.   Over 15 minutes of exam, counseling, chart review, and critical decision making was performed.   Future Appointments  Date Time Provider [place]  11/17/2019 11:00 AM Unk Pinto, MD [facility] None    ------------------------------------------------------------------------------------------------------------------   HPI BP (!) 164/92   Pulse 71   Temp 97.7 F (36.5 C)   SpO2 96%   74 y.o.male with hx, poorly controlled T2DM with CKD 2, morbid obesity, bil knee arthritis, hx of limited episodes of lower back pain, reports hx of mild bulging disc per MRI he had by Dr. Lynann Bologna "last year" - was referred in 07/2017, no notes/reports accessible. Reports was not recommended surgery or any interventions. Conservative measures and monitoring only.    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 He reports onset was gradual, intermittent, worse with walking, severe pain when standing/getting out of truck, will improve/resolve when sitting still, lying supine after he relaxes for a few minutes. He reports improves over the weekend while not working, but overall getting worse. Describes pain as throbbing, punching, 7-8/10 when moving. Reports sensation of lump in R lumbar. Denies radiating pain, numbness, tingling. Denies fever/chills, rash, loss of bladder or bowel control.   He reports has taken tylenol 500 mg occasionally in addition to mobic 15 mg daily (has been taking for many years). Tylenol does improve pain some but no overall improvement. Took a muscle relaxer from wife over the weekend and felt this helped. He has not tried ice/heat.   He is getting gel shots for bil knees via Dr. Anne Fu office, due for TKA and walks with cane.     Lab Results  Component Value Date   GFRNONAA 65 06/30/2019   Last A1C in the office was:  Lab Results  Component Value Date   HGBA1C 9.4 (H) 06/30/2019    Past Medical History:  Diagnosis Date  .  Covid-19 Positive (02/06/2019)  02/11/2019  . BPH (benign prostatic hypertrophy)   . Diabetes mellitus without complication ([place])   . GERD (gastroesophageal reflux disease)   . Headache    PMH  . Hypertension   . Hypogonadism male   . Mass    left shoulder AC joint  . OSA (obstructive sleep apnea)    wears CPAP  . PONV (postoperative  nausea and vomiting)   . Sarcoidosis   . Vitamin D deficiency      Allergies  Allergen Reactions  . Ace Inhibitors Other (See Comments)    Unknown  . Ibuprofen Other (See Comments)    GI upset  . Invokana [Canagliflozin] Other (See Comments)    Unknown  . Metformin And Related  Diarrhea  . Quinidine Other (See Comments)    Unknown    Current Outpatient Medications on File Prior to Visit  Medication Sig  . aspirin EC 81 MG tablet Take 81 mg by mouth daily.  Marland Kitchen atenolol (TENORMIN) 100 MG tablet Take 1 tablet Daily for BP  . B-D 3CC LUER-LOK SYR 21GX1" 21G X 1" 3 ML MISC use as directed  . Blood Glucose Monitoring Suppl (ONETOUCH VERIO) w/Device KIT Test blood sugar once daily.  . Blood Glucose Monitoring Suppl DEVI Use meter three times a day to check blood sugar  . Cholecalciferol (VITAMIN D3) 2000 units TABS Take 8,000 Units by mouth daily.  . Cinnamon 500 MG capsule Take 1,000 mg by mouth daily.   Marland Kitchen glucose blood (ONETOUCH VERIO) test strip Test blood sugar once daily  . glucose blood test strip Test blood sugar three times a day  . glyBURIDE (DIABETA) 5 MG tablet TAKE 1/2 TO 1 TABLET BY MOUTH THREE TIMES DAILY BEFORE MEALS AS DIRECTED FOR DIABETES  . hydrochlorothiazide (HYDRODIURIL) 25 MG tablet Take 1 Tablet Daily for BP & Fluid  . Lancets (ONETOUCH ULTRASOFT) lancets Test blood sugar once daily  . losartan (COZAAR) 100 MG tablet Take 1 tablet Daily for BP  . Magnesium 500 MG TABS Take 0.5 tablets (250 mg total) by mouth 3 (three) times daily. (Patient taking differently: Take 500 mg by mouth daily. )  . meclizine (ANTIVERT) 25 MG tablet Take 25 mg by mouth 3 (three) times daily as needed for dizziness.  . meloxicam (MOBIC) 15 MG tablet Take 1/2 to 1 tablet Daily with food for Pain & Inflammation - limit to 4 -5 days /week to Avoid Kidney Damage  . ondansetron (ZOFRAN) 8 MG tablet Take 1/2 to 1 tablet 3 x day if needed for Nausea  . sitaGLIPtin-metformin (JANUMET) 50-500 MG tablet Take 1 tablet 2 x /day with a meal for Diabetes (Patient taking differently: Take 1 tablet by mouth 2 (two) times daily with a meal. Take 1 tablet 2 x /day with a meal for Diabetes)  . tamsulosin (FLOMAX) 0.4 MG CAPS capsule Take 1 tablet Daily for Prostate  . TURMERIC PO Take 1  capsule by mouth daily.  . vitamin C (ASCORBIC ACID) 500 MG tablet Take 500 mg by mouth 2 (two) times daily.  Marland Kitchen zinc gluconate 50 MG tablet Take 50 mg by mouth daily.   No current facility-administered medications on file prior to visit.    ROS: all negative except above.   Physical Exam:  BP (!) 164/92   Pulse 71   Temp 97.7 F (36.5 C)   SpO2 96%   General Appearance: Well nourished, obese male in no apparent distress. Eyes: PERRLA, conjunctiva no swelling or erythema ENT/Mouth: Ext aud canals clear, TMs without erythema, bulging. Mask in place; oral exam. Hearing normal.  Neck: Supple, thyroid normal.  Respiratory: Respiratory effort normal, BS equal bilaterally without rales, rhonchi, wheezing or stridor.  Cardio: RRR with no MRGs. Brisk peripheral pulses without edema.  Abdomen: Soft, obese + BS.  Non tender, no guarding, rebound, hernias, masses. Lymphatics: Non tender without lymphadenopathy.  Musculoskeletal:  Patient is not able to ambulate well. Gait is  Antalgic. Straight leg raising with dorsiflexion negative bilaterally for radicular symptoms. Sensory exam in the legs are normal. Knee reflexes are normal Ankle reflexes are normal Strength is normal and symmetric in arms and legs. There is not SI tenderness to palpation.  There is paraspinal muscle spasm.  There is not midline tenderness.  ROM of spine with  limited in extension due to pain.  Skin: Warm, dry without rashes, lesions, ecchymosis.  Neuro: Normal muscle tone, Sensation intact.  Psych: Awake and oriented X 3, normal affect, Insight and Judgment appropriate.     Izora Ribas, NP 12:26 PM [facility]

## 2019-09-11 ENCOUNTER — Other Ambulatory Visit: Payer: Self-pay | Admitting: Internal Medicine

## 2019-09-11 MED ORDER — DEXAMETHASONE 4 MG PO TABS: ORAL_TABLET | ORAL | 0 refills | Status: DC

## 2019-09-11 MED ORDER — METHOCARBAMOL 500 MG PO TABS: ORAL_TABLET | ORAL | 0 refills | Status: DC

## 2019-09-18 ENCOUNTER — Ambulatory Visit: Payer: Managed Care, Other (non HMO) | Admitting: Physician Assistant

## 2019-09-18 ENCOUNTER — Encounter: Payer: Self-pay | Admitting: Physician Assistant

## 2019-09-18 ENCOUNTER — Ambulatory Visit
Admission: RE | Admit: 2019-09-18 | Discharge: 2019-09-18 | Disposition: A | Discharge status: Home / Self Care | Payer: Managed Care, Other (non HMO) | Source: Ambulatory Visit | Attending: Physician Assistant | Admitting: Physician Assistant

## 2019-09-18 ENCOUNTER — Other Ambulatory Visit: Payer: Self-pay

## 2019-09-18 VITALS — BP 142/80 | HR 87 | Temp 97.5°F | Wt 217.0 lb

## 2019-09-18 DIAGNOSIS — R109 Unspecified abdominal pain: Secondary | ICD-10-CM | POA: Diagnosis not present

## 2019-09-18 DIAGNOSIS — R350 Frequency of micturition: Secondary | ICD-10-CM | POA: Diagnosis not present

## 2019-09-18 DIAGNOSIS — E782 Mixed hyperlipidemia: Secondary | ICD-10-CM | POA: Diagnosis not present

## 2019-09-18 DIAGNOSIS — R0602 Shortness of breath: Secondary | ICD-10-CM | POA: Diagnosis not present

## 2019-09-18 NOTE — Patient Instructions (Signed)
Will get a CT chest with or without contrast depending on your kidney function  Please get salonpas patches over the counter, do heat and follow up with Dr. Lynann Bologna  May get an [place] in the future  INFORMATION ABOUT YOUR XRAY  Can walk into [address] for an Insurance account manager. They will have the order and take you back. You do not any paper work, I should get the result back today or tomorrow. This order is good for a year.  Can call [phone] to schedule an appointment if you wish.   INFORMATION ABOUT YOUR STEROID INHALER  Can do steroid inhaler, NEED TO DO DAILY, this is NOT a rescue inhaler so if you are acutely short of breath please use your albuterol or call 911.  Do 1 puff TWICE a day.  Do before you brush your teeth OR wash your mouth afterwards.  IF YOU DO NOT Mar-Mac YOUR MOUTH OUT IT CAN CAUSE YEAST Can do 2 tsp vinegar with water and switch to help prevent yeast or help yeast in your mouth.   Go to the ER if any chest pain, shortness of breath, nausea, dizziness, severe HA, changes vision/speech  BACK PAIN  Try the exercises and other information in the back care manual.   You can take meloxicam once during the day as needed (avoid taking other NSAIDS like Alleve or Ibuprofen while taking this)   You can take flexeril if needed at bedtime for muscle spasm. This can be taken up to every 8 hours, but causes sedation, so should not drive or operate heavy machinery while taking this medicine.   Go to the ER if you have any new weakness in your legs, have trouble controlling your urine or bowels, or have worsening pain.   If you are not better in 1-3 month we will refer you to ortho   Back pain Rehab Ask your health care provider which exercises are safe for you. Do exercises exactly as told by your health care provider and adjust them as directed. It is normal to feel mild stretching, pulling, tightness, or discomfort as you do these exercises, but you should stop right away  if you feel sudden pain or your pain gets worse. Do not begin these exercises until told by your health care provider. Stretching and range of motion exercises These exercises warm up your muscles and joints and improve the movement and flexibility of your hips and your back. These exercises also help to relieve pain, numbness, and tingling. Exercise A: Sciatic nerve glide 1. Sit in a chair with your head facing down toward your chest. Place your hands behind your back. Let your shoulders slump forward. 2. Slowly straighten one of your knees while you tilt your head back as if you are looking toward the ceiling. Only straighten your leg as far as you can without making your symptoms worse. 3. Hold for __________ seconds. 4. Slowly return to the starting position. 5. Repeat with your other leg. Repeat __________ times. Complete this exercise __________ times a day. Exercise B: Knee to chest with hip adduction and internal rotation  1. Lie on your back on a firm surface with both legs straight. 2. Bend one of your knees and move it up toward your chest until you feel a gentle stretch in your lower back and buttock. Then, move your knee toward the shoulder that is on the opposite side from your leg. ? Hold your leg in this position by holding onto  the front of your knee. 3. Hold for __________ seconds. 4. Slowly return to the starting position. 5. Repeat with your other leg. Repeat __________ times. Complete this exercise __________ times a day. Exercise C: Prone extension on elbows  1. Lie on your abdomen on a firm surface. A bed may be too soft for this exercise. 2. Prop yourself up on your elbows. 3. Use your arms to help lift your chest up until you feel a gentle stretch in your abdomen and your lower back. ? This will place some of your body weight on your elbows. If this is uncomfortable, try stacking pillows under your chest. ? Your hips should stay down, against the surface that you are  lying on. Keep your hip and back muscles relaxed. 4. Hold for __________ seconds. 5. Slowly relax your upper body and return to the starting position. Repeat __________ times. Complete this exercise __________ times a day. Strengthening exercises These exercises build strength and endurance in your back. Endurance is the ability to use your muscles for a long time, even after they get tired. Exercise D: Pelvic tilt 1. Lie on your back on a firm surface. Bend your knees and keep your feet flat. 2. Tense your abdominal muscles. Tip your pelvis up toward the ceiling and flatten your lower back into the floor. ? To help with this exercise, you may place a small towel under your lower back and try to push your back into the towel. 3. Hold for __________ seconds. 4. Let your muscles relax completely before you repeat this exercise. Repeat __________ times. Complete this exercise __________ times a day. Exercise E: Alternating arm and leg raises  1. Get on your hands and knees on a firm surface. If you are on a hard floor, you may want to use padding to cushion your knees, such as an exercise mat. 2. Line up your arms and legs. Your hands should be below your shoulders, and your knees should be below your hips. 3. Lift your left leg behind you. At the same time, raise your right arm and straighten it in front of you. ? Do not lift your leg higher than your hip. ? Do not lift your arm higher than your shoulder. ? Keep your abdominal and back muscles tight. ? Keep your hips facing the ground. ? Do not arch your back. ? Keep your balance carefully, and do not hold your breath. 4. Hold for __________ seconds. 5. Slowly return to the starting position and repeat with your right leg and your left arm. Repeat __________ times. Complete this exercise __________ times a day. Posture and body mechanics  Body mechanics refers to the movements and positions of your body while you do your daily activities.  Posture is part of body mechanics. Good posture and healthy body mechanics can help to relieve stress in your body's tissues and joints. Good posture means that your spine is in its natural S-curve position (your spine is neutral), your shoulders are pulled back slightly, and your head is not tipped forward. The following are general guidelines for applying improved posture and body mechanics to your everyday activities. Standing   When standing, keep your spine neutral and your feet about hip-width apart. Keep a slight bend in your knees. Your ears, shoulders, and hips should line up.  When you do a task in which you stand in one place for a long time, place one foot up on a stable object that is 2-4 inches (5-10 cm)  high, such as a footstool. This helps keep your spine neutral. Sitting   When sitting, keep your spine neutral and keep your feet flat on the floor. Use a footrest, if necessary, and keep your thighs parallel to the floor. Avoid rounding your shoulders, and avoid tilting your head forward.  When working at a desk or a computer, keep your desk at a height where your hands are slightly lower than your elbows. Slide your chair under your desk so you are close enough to maintain good posture.  When working at a computer, place your monitor at a height where you are looking straight ahead and you do not have to tilt your head forward or downward to look at the screen. Resting   When lying down and resting, avoid positions that are most painful for you.  If you have pain with activities such as sitting, bending, stooping, or squatting (flexion-based activities), lie in a position in which your body does not bend very much. For example, avoid curling up on your side with your arms and knees near your chest (fetal position).  If you have pain with activities such as standing for a long time or reaching with your arms (extension-based activities), lie with your spine in a neutral position  and bend your knees slightly. Try the following positions: ? Lying on your side with a pillow between your knees. ? Lying on your back with a pillow under your knees. Lifting   When lifting objects, keep your feet at least shoulder-width apart and tighten your abdominal muscles.  Bend your knees and hips and keep your spine neutral. It is important to lift using the strength of your legs, not your back. Do not lock your knees straight out.  Always ask for help to lift heavy or awkward objects. This information is not intended to replace advice given to you by your health care provider. Make sure you discuss any questions you have with your health care provider. Document Released: 07/10/2005 Document Revised: 03/16/2016 Document Reviewed: 03/26/2015 Elsevier Interactive Patient Education  2018 [place] of Breath, Adult Shortness of breath is when a person has trouble breathing enough air or when a person feels like she or he is having trouble breathing in enough air. Shortness of breath could be a sign of a medical problem. Follow these instructions at home:   Pay attention to any changes in your symptoms.  Do not use any products that contain nicotine or tobacco, such as cigarettes, e-cigarettes, and chewing tobacco.  Do not smoke. Smoking is a common cause of shortness of breath. If you need help quitting, ask your health care provider.  Avoid things that can irritate your airways, such as: ? Mold. ? Dust. ? Air pollution. ? Chemical fumes. ? Things that can cause allergy symptoms (allergens), if you have allergies.  Keep your living space clean and free of mold and dust.  Rest as needed. Slowly return to your usual activities.  Take over-the-counter and prescription medicines only as told by your health care provider. This includes oxygen therapy and inhaled medicines.  Keep all follow-up visits as told by your health care provider. This is  important. Contact a health care provider if:  Your condition does not improve as soon as expected.  You have a hard time doing your normal activities, even after you rest.  You have new symptoms. Get help right away if:  Your shortness of breath gets worse.  You have shortness of  breath when you are resting.  You feel light-headed or you faint.  You have a cough that is not controlled with medicines.  You cough up blood.  You have pain with breathing.  You have pain in your chest, arms, shoulders, or abdomen.  You have a fever.  You cannot walk up stairs or exercise the way that you normally do. These symptoms may represent a serious problem that is an emergency. Do not wait to see if the symptoms will go away. Get medical help right away. Call your local emergency services (911 in the [place] Do not drive yourself to the hospital. Summary  Shortness of breath is when a person has trouble breathing enough air. It can be a sign of a medical problem.  Avoid things that irritate your lungs, such as smoking, pollution, mold, and dust.  Pay attention to changes in your symptoms and contact your health care provider if you have a hard time completing daily activities because of shortness of breath. This information is not intended to replace advice given to you by your health care provider. Make sure you discuss any questions you have with your health care provider. Document Revised: 12/10/2017 Document Reviewed: 12/10/2017 Elsevier Patient Education  [place].

## 2019-09-18 NOTE — Progress Notes (Signed)
 Subjective:    Patient ID: Phillip Powell, male    DOB: 01/30/1946, 73 y.o.   MRN: [id number]  HPI 73 y.o. WM has history of kidney stone, DM2, COVID in July presents with back pain x 2 weeks.  He was seen 09/08/19, feels a knot on a right side, if he moves a certain way he will feel that it pushes on his rib cage. Worse with turning the right. Was hurting all around his back but better since on prednisone.   Nocutria every 2 hours.   No burning with urine. Has been worse since using prednisone- has not been checking sugars.  Was given zanaflex but it was too much, can tolerate Robaxin Has seen Dr. Dumonski in the past for back pain.    He had COVID in July, was in the hospital, has history of sarcoidosis. His wife gives the story, states from walking into the living room to get dressed and walking back he is very short of breath. Wife states he has seen a big difference. Had CXR 03/2019. No swelling his legs, no swelling in AB.  Has CPAP that he wears at night, no orthopnea, no PND.   Wife states some personality changes, he is cursing more. No brain fog or memory issues.   Blood pressure (!) 142/80, pulse 87, temperature (!) 97.5 F (36.4 C), weight 217 lb (98.4 kg), SpO2 97 %.  Medications  Current Outpatient Medications (Endocrine & Metabolic):  .  glyBURIDE (DIABETA) 5 MG tablet, TAKE 1/2 TO 1 TABLET BY MOUTH THREE TIMES DAILY BEFORE MEALS AS DIRECTED FOR DIABETES .  sitaGLIPtin-metformin (JANUMET) 50-500 MG tablet, Take 1 tablet 2 x /day with a meal for Diabetes (Patient taking differently: Take 1 tablet by mouth 2 (two) times daily with a meal. Take 1 tablet 2 x /day with a meal for Diabetes)  Current Outpatient Medications (Cardiovascular):  .  atenolol (TENORMIN) 100 MG tablet, Take 1 tablet Daily for BP .  hydrochlorothiazide (HYDRODIURIL) 25 MG tablet, Take 1 Tablet Daily for BP & Fluid .  losartan (COZAAR) 100 MG tablet, Take 1 tablet Daily for BP   Current Outpatient  Medications (Analgesics):  .  aspirin EC 81 MG tablet, Take 81 mg by mouth daily. .  meloxicam (MOBIC) 15 MG tablet, Take 1/2 to 1 tablet Daily with food for Pain & Inflammation - limit to 4 -5 days /week to Avoid Kidney Damage   Current Outpatient Medications (Other):  .  B-D 3CC LUER-LOK SYR 21GX1" 21G X 1" 3 ML MISC, use as directed .  Blood Glucose Monitoring Suppl (ONETOUCH VERIO) w/Device KIT, Test blood sugar once daily. .  Blood Glucose Monitoring Suppl DEVI, Use meter three times a day to check blood sugar .  Cholecalciferol (VITAMIN D3) 2000 units TABS, Take 8,000 Units by mouth daily. .  Cinnamon 500 MG capsule, Take 1,000 mg by mouth daily.  .  glucose blood (ONETOUCH VERIO) test strip, Test blood sugar once daily .  glucose blood test strip, Test blood sugar three times a day .  Lancets (ONETOUCH ULTRASOFT) lancets, Test blood sugar once daily .  Magnesium 500 MG TABS, Take 0.5 tablets (250 mg total) by mouth 3 (three) times daily. (Patient taking differently: Take 500 mg by mouth daily. ) .  meclizine (ANTIVERT) 25 MG tablet, Take 25 mg by mouth 3 (three) times daily as needed for dizziness. .  methocarbamol (ROBAXIN) 500 MG tablet, Take 1 or 2 tablets 4 x /day for   Muscle Spasm .  ondansetron (ZOFRAN) 8 MG tablet, Take 1/2 to 1 tablet 3 x day if needed for Nausea .  tamsulosin (FLOMAX) 0.4 MG CAPS capsule, Take 1 tablet Daily for Prostate .  tiZANidine (ZANAFLEX) 4 MG tablet, Take 1 tablet (4 mg total) by mouth every 8 (eight) hours as needed for muscle spasms. May cause drowsiness, do not take while driving or with alcohol. .  TURMERIC PO, Take 1 capsule by mouth daily. .  vitamin C (ASCORBIC ACID) 500 MG tablet, Take 500 mg by mouth 2 (two) times daily. .  zinc gluconate 50 MG tablet, Take 50 mg by mouth daily.  Problem list He has Hypertension; Type 2 diabetes mellitus (HCC); GERD (gastroesophageal reflux disease); Testosterone Deficiency; OSA (obstructive sleep apnea);  Vitamin D deficiency; BPH (benign prostatic hyperplasia); Hyperlipidemia associated with type 2 diabetes mellitus (HCC); Medication management; Obesity (BMI 30.0-34.9); Poor compliance with Diet ; Gluttony; and CKD stage 3 due to type 2 diabetes mellitus (HCC) on their problem list.   Review of Systems  Constitutional: Positive for fatigue. Negative for chills, diaphoresis and fever.  HENT: Negative.   Respiratory: Positive for shortness of breath. Negative for apnea, cough, choking, chest tightness, wheezing and stridor.   Cardiovascular: Negative.  Negative for chest pain, palpitations and leg swelling.  Gastrointestinal: Negative.   Genitourinary: Positive for flank pain and frequency.  Musculoskeletal: Positive for back pain.  Skin: Negative.  Negative for rash.  Neurological: Negative.   Psychiatric/Behavioral: Positive for dysphoric mood (per wife).       Objective:   Physical Exam General Appearance: Well nourished, obese male in no apparent distress, appears uncomfortable. Eyes: PERRLA, conjunctiva no swelling or erythema ENT/Mouth: Ext aud canals clear, TMs without erythema, bulging. Mask in place; oral exam. Hearing normal.  Neck: Supple, thyroid normal.  Respiratory: Respiratory effort normal, BS equal bilaterally, decreased breath sounds without rales, rhonchi, wheezing or stridor.  Cardio: RRR with no MRGs. Brisk peripheral pulses with mild edema.  Abdomen: Soft, obese + BS.  Non tender, no guarding, rebound, hernias, masses. Lymphatics: Non tender without lymphadenopathy.  Musculoskeletal: Patient is not able to ambulate well. Gait is  Antalgic, with a cane. Straight leg raising with dorsiflexion negative bilaterally for radicular symptoms. Strength is normal and symmetric in arms and legs. There is not SI tenderness to palpation.  There is paraspinal muscle spasm on the right.  There is not midline tenderness.  ROM of spine with  limited in extension due to pain.  Skin:  Warm, dry without rashes, lesions, ecchymosis.  Neuro: Normal muscle tone, Sensation intact.  Psych: Awake and oriented X 3, normal affect, Insight and Judgment appropriate.      Assessment & Plan:  Diagnoses and all orders for this visit: Shortness of breath -     EKG 12-Lead- normal EKG - get CXR, check labs, given samples inhaler - close follow up 2 weeks  Urinary frequency -     Urinalysis, Routine w reflex microscopic -     Urine Culture - likely from prednisone use but will rule out infection  Right flank pain -     CBC with Differential/Platelet -     COMPLETE METABOLIC PANEL WITH GFR -     TSH -     DG Abd 1 View; Future - possibly mechanical however with history of kidney stone will get KUB and possible CT AB - check labs  Out of work 09/08/19 and 10/06/19  ADDENDUM: Patient has acute on   chronic renal failure, due to it being the weekend and right flank pain/SOB will refer to ER. Some elevated WBC but patient is on prednisone.  

## 2019-09-19 ENCOUNTER — Emergency Department (HOSPITAL_COMMUNITY): Payer: Managed Care, Other (non HMO)

## 2019-09-19 ENCOUNTER — Emergency Department (HOSPITAL_COMMUNITY)
Admission: EM | Admit: 2019-09-19 | Discharge: 2019-09-19 | Disposition: A | Discharge status: Home / Self Care | Payer: Managed Care, Other (non HMO) | Source: Home / Self Care | Attending: Emergency Medicine | Admitting: Emergency Medicine

## 2019-09-19 ENCOUNTER — Encounter (HOSPITAL_COMMUNITY): Payer: Self-pay | Admitting: Emergency Medicine

## 2019-09-19 ENCOUNTER — Other Ambulatory Visit: Payer: Self-pay

## 2019-09-19 ENCOUNTER — Telehealth: Payer: Self-pay | Admitting: Physician Assistant

## 2019-09-19 DIAGNOSIS — Z7984 Long term (current) use of oral hypoglycemic drugs: Secondary | ICD-10-CM | POA: Insufficient documentation

## 2019-09-19 DIAGNOSIS — Z20822 Contact with and (suspected) exposure to covid-19: Secondary | ICD-10-CM | POA: Insufficient documentation

## 2019-09-19 DIAGNOSIS — N202 Calculus of kidney with calculus of ureter: Secondary | ICD-10-CM | POA: Insufficient documentation

## 2019-09-19 DIAGNOSIS — N132 Hydronephrosis with renal and ureteral calculous obstruction: Secondary | ICD-10-CM

## 2019-09-19 DIAGNOSIS — E119 Type 2 diabetes mellitus without complications: Secondary | ICD-10-CM | POA: Insufficient documentation

## 2019-09-19 DIAGNOSIS — I10 Essential (primary) hypertension: Secondary | ICD-10-CM | POA: Insufficient documentation

## 2019-09-19 DIAGNOSIS — Z8616 Personal history of COVID-19: Secondary | ICD-10-CM | POA: Insufficient documentation

## 2019-09-19 DIAGNOSIS — N134 Hydroureter: Secondary | ICD-10-CM | POA: Insufficient documentation

## 2019-09-19 DIAGNOSIS — N1330 Unspecified hydronephrosis: Secondary | ICD-10-CM | POA: Insufficient documentation

## 2019-09-19 DIAGNOSIS — Z6835 Body mass index (BMI) 35.0-35.9, adult: Secondary | ICD-10-CM | POA: Insufficient documentation

## 2019-09-19 DIAGNOSIS — N179 Acute kidney failure, unspecified: Secondary | ICD-10-CM

## 2019-09-19 DIAGNOSIS — E669 Obesity, unspecified: Secondary | ICD-10-CM | POA: Insufficient documentation

## 2019-09-19 DIAGNOSIS — R0602 Shortness of breath: Secondary | ICD-10-CM

## 2019-09-19 LAB — RESPIRATORY PANEL BY RT PCR (FLU A&B, COVID)
Influenza A by PCR: NEGATIVE
Influenza B by PCR: NEGATIVE
SARS Coronavirus 2 by RT PCR: NEGATIVE

## 2019-09-19 LAB — CBC
HCT: 44 % (ref 39.0–52.0)
Hemoglobin: 15.2 g/dL (ref 13.0–17.0)
MCH: 28 pg (ref 26.0–34.0)
MCHC: 34.5 g/dL (ref 30.0–36.0)
MCV: 81 fL (ref 80.0–100.0)
Platelets: 195 10*3/uL (ref 150–400)
RBC: 5.43 MIL/uL (ref 4.22–5.81)
RDW: 13 % (ref 11.5–15.5)
WBC: 10.2 10*3/uL (ref 4.0–10.5)
nRBC: 0 % (ref 0.0–0.2)

## 2019-09-19 LAB — POCT I-STAT EG7
Acid-base deficit: 2 mmol/L (ref 0.0–2.0)
Bicarbonate: 22.4 mmol/L (ref 20.0–28.0)
Calcium, Ion: 1.13 mmol/L — ABNORMAL LOW (ref 1.15–1.40)
HCT: 43 % (ref 39.0–52.0)
Hemoglobin: 14.6 g/dL (ref 13.0–17.0)
O2 Saturation: 95 %
Potassium: 4.3 mmol/L (ref 3.5–5.1)
Sodium: 134 mmol/L — ABNORMAL LOW (ref 135–145)
TCO2: 23 mmol/L (ref 22–32)
pCO2, Ven: 35.4 mmHg — ABNORMAL LOW (ref 44.0–60.0)
pH, Ven: 7.408 (ref 7.250–7.430)
pO2, Ven: 73 mmHg — ABNORMAL HIGH (ref 32.0–45.0)

## 2019-09-19 LAB — URINE CULTURE
MICRO NUMBER:: 10189751
Result:: NO GROWTH
SPECIMEN QUALITY:: ADEQUATE

## 2019-09-19 LAB — CBC WITH DIFFERENTIAL/PLATELET
Absolute Monocytes: 870 cells/uL (ref 200–950)
Basophils Absolute: 23 cells/uL (ref 0–200)
Basophils Relative: 0.2 %
Eosinophils Absolute: 35 cells/uL (ref 15–500)
Eosinophils Relative: 0.3 %
HCT: 44.3 % (ref 38.5–50.0)
Hemoglobin: 15 g/dL (ref 13.2–17.1)
Lymphs Abs: 2146 cells/uL (ref 850–3900)
MCH: 27.6 pg (ref 27.0–33.0)
MCHC: 33.9 g/dL (ref 32.0–36.0)
MCV: 81.4 fL (ref 80.0–100.0)
MPV: 9.5 fL (ref 7.5–12.5)
Monocytes Relative: 7.5 %
Neutro Abs: 8526 cells/uL — ABNORMAL HIGH (ref 1500–7800)
Neutrophils Relative %: 73.5 %
Platelets: 216 10*3/uL (ref 140–400)
RBC: 5.44 10*6/uL (ref 4.20–5.80)
RDW: 13.6 % (ref 11.0–15.0)
Total Lymphocyte: 18.5 %
WBC: 11.6 10*3/uL — ABNORMAL HIGH (ref 3.8–10.8)

## 2019-09-19 LAB — CBG MONITORING, ED: Glucose-Capillary: 203 mg/dL — ABNORMAL HIGH (ref 70–99)

## 2019-09-19 LAB — URINALYSIS, ROUTINE W REFLEX MICROSCOPIC
Bacteria, UA: NONE SEEN
Bilirubin Urine: NEGATIVE
Bilirubin Urine: NEGATIVE
Glucose, UA: >=500 mg/dL — AB
Hgb urine dipstick: NEGATIVE
Hgb urine dipstick: NEGATIVE
Ketones, ur: NEGATIVE mg/dL
Leukocytes,Ua: NEGATIVE
Leukocytes,Ua: NEGATIVE
Nitrite: NEGATIVE
Nitrite: NEGATIVE
Protein, ur: NEGATIVE
Protein, ur: NEGATIVE mg/dL
Specific Gravity, Urine: 1.025 (ref 1.001–1.03)
Specific Gravity, Urine: 1.017 (ref 1.005–1.030)
pH: 5.5 (ref 5.0–8.0)
pH: 5 (ref 5.0–8.0)

## 2019-09-19 LAB — COMPLETE METABOLIC PANEL WITH GFR
AG Ratio: 1.7 (calc) (ref 1.0–2.5)
ALT: 22 U/L (ref 9–46)
AST: 13 U/L (ref 10–35)
Albumin: 4 g/dL (ref 3.6–5.1)
Alkaline phosphatase (APISO): 75 U/L (ref 35–144)
BUN/Creatinine Ratio: 28 (calc) — ABNORMAL HIGH (ref 6–22)
BUN: 57 mg/dL — ABNORMAL HIGH (ref 7–25)
CO2: 28 mmol/L (ref 20–32)
Calcium: 9.4 mg/dL (ref 8.6–10.3)
Chloride: 97 mmol/L — ABNORMAL LOW (ref 98–110)
Creat: 2.04 mg/dL — ABNORMAL HIGH (ref 0.70–1.18)
GFR, Est African American: 37 mL/min/{1.73_m2} — ABNORMAL LOW (ref >=60)
GFR, Est Non African American: 32 mL/min/{1.73_m2} — ABNORMAL LOW (ref >=60)
Globulin: 2.3 g/dL (calc) (ref 1.9–3.7)
Glucose, Bld: 274 mg/dL — ABNORMAL HIGH (ref 65–99)
Potassium: 4.9 mmol/L (ref 3.5–5.3)
Sodium: 136 mmol/L (ref 135–146)
Total Bilirubin: 0.7 mg/dL (ref 0.2–1.2)
Total Protein: 6.3 g/dL (ref 6.1–8.1)

## 2019-09-19 LAB — BRAIN NATRIURETIC PEPTIDE: B Natriuretic Peptide: 84.3 pg/mL (ref 0.0–100.0)

## 2019-09-19 LAB — COMPREHENSIVE METABOLIC PANEL
ALT: 31 U/L (ref 0–44)
AST: 19 U/L (ref 15–41)
Albumin: 3.3 g/dL — ABNORMAL LOW (ref 3.5–5.0)
Alkaline Phosphatase: 77 U/L (ref 38–126)
Anion gap: 14 (ref 5–15)
BUN: 59 mg/dL — ABNORMAL HIGH (ref 8–23)
CO2: 22 mmol/L (ref 22–32)
Calcium: 9.1 mg/dL (ref 8.9–10.3)
Chloride: 98 mmol/L (ref 98–111)
Creatinine, Ser: 2.17 mg/dL — ABNORMAL HIGH (ref 0.61–1.24)
GFR calc Af Amer: 34 mL/min — ABNORMAL LOW (ref >60)
GFR calc non Af Amer: 29 mL/min — ABNORMAL LOW (ref >60)
Glucose, Bld: 413 mg/dL — ABNORMAL HIGH (ref 70–99)
Potassium: 4.6 mmol/L (ref 3.5–5.1)
Sodium: 134 mmol/L — ABNORMAL LOW (ref 135–145)
Total Bilirubin: 1.2 mg/dL (ref 0.3–1.2)
Total Protein: 6.2 g/dL — ABNORMAL LOW (ref 6.5–8.1)

## 2019-09-19 LAB — TROPONIN I (HIGH SENSITIVITY)
Troponin I (High Sensitivity): 3 ng/L (ref <18)
Troponin I (High Sensitivity): 5 ng/L (ref <18)

## 2019-09-19 LAB — TSH: TSH: 1.79 mIU/L (ref 0.40–4.50)

## 2019-09-19 LAB — D-DIMER, QUANTITATIVE: D-Dimer, Quant: 3.09 ug/mL-FEU — ABNORMAL HIGH (ref 0.00–0.50)

## 2019-09-19 MED ORDER — TECHNETIUM TO 99M ALBUMIN AGGREGATED
1.5500 | Freq: Once | INTRAVENOUS | Status: AC | PRN
  Administered 2019-09-19: 17:00:00 1.55 via INTRAVENOUS

## 2019-09-19 MED ORDER — SODIUM CHLORIDE 0.9 % IV BOLUS
1000.0000 mL | Freq: Once | INTRAVENOUS | Status: AC
  Administered 2019-09-19: 1000 mL via INTRAVENOUS

## 2019-09-19 MED ORDER — TECHNETIUM TC 99M DIETHYLENETRIAME-PENTAACETIC ACID
39.7000 | Freq: Once | INTRAVENOUS | Status: AC | PRN
  Administered 2019-09-19: 39.7 via RESPIRATORY_TRACT

## 2019-09-19 MED ORDER — INSULIN ASPART 100 UNIT/ML ~~LOC~~ SOLN
10.0000 [IU] | Freq: Once | SUBCUTANEOUS | Status: AC
  Administered 2019-09-19: 10 [IU] via INTRAVENOUS

## 2019-09-19 MED ORDER — SODIUM CHLORIDE 0.9% FLUSH
3.0000 mL | Freq: Once | INTRAVENOUS | Status: AC
  Administered 2019-09-19: 3 mL via INTRAVENOUS

## 2019-09-19 NOTE — ED Provider Notes (Signed)
  Physical Exam  BP (!) 172/89   Pulse 64   Temp 98.2 F (36.8 C) (Oral)   Resp 16   Ht 5\' 8"  (1.727 m)   Wt 105.7 kg   SpO2 96%   BMI 35.43 kg/m   Physical Exam  ED Course/Procedures   Clinical Course as of Sep 18 1822  Fri Sep 19, 2019  1354 D-Dimer, Quant(!): 3.09 [MV]  1401 Glucose(!): 413 [MV]  1402 Creatinine(!): 2.17 [MV]  1524 1 month of flank pain. Awaiting V/Q scan. Received fluids for AKI and has an obstructive kidney stone. Patient wants to go home. Awaiting urinalysis and v/q scan. May be discharged if v/q scan normal and urology f/u   [KM]  1823 Patient VQ scan was unremarkable.  Patient remained stable.  He would like to go home.  Advised to drink plenty of fluids and follow-up with urology.   [KM]    Clinical Course User Index [KM] Alveria Apley, PA-C [MV] Eustaquio Maize, PA-C    Procedures  MDM   Based on review of vitals, medical screening exam, lab work and/or imaging, there does not appear to be an acute, emergent etiology for the patient's symptoms. Counseled pt on good return precautions and encouraged both PCP and ED follow-up as needed.  Prior to discharge, I also discussed incidental imaging findings with patient in detail and advised appropriate, recommended follow-up in detail.  Clinical Impression: 1. AKI (acute kidney injury) ([place])   2. Ureteral stone with hydronephrosis   3. Shortness of breath     Disposition: Discharge  Prior to providing a prescription for a controlled substance, I independently reviewed the patient's recent prescription history on the [place]. The patient had no recent or regular prescriptions and was deemed appropriate for a brief, less than 3 day prescription of narcotic for acute analgesia.  This note was prepared with assistance of Systems analyst. Occasional wrong-word or sound-a-like substitutions may have occurred due to the inherent limitations  of voice recognition software.       Kristine Royal 09/19/19 1824    Tegeler, Gwenyth Allegra, MD 09/20/19 [phone]

## 2019-09-19 NOTE — ED Triage Notes (Addendum)
To ED via [facility] from home, states his primary care dr called him to tell him he had abnormal labs, -- early renal failure, covid + in July C/o back pain, was on prednisone for same- last dose yesterday

## 2019-09-19 NOTE — ED Notes (Signed)
[facility] transported [facility]  

## 2019-09-19 NOTE — Telephone Encounter (Signed)
74 y.o. morbidly obese WM with history of kidney stone, DM2 with CKD, COVID in July presented yesterday with right flank/back pain and SOB.   He was having back pain, appeared to be more mechanical on examination however with his kidney stone history I got labs and a KUB. His Xray was negative for a stone however his kidney function showed acute on chronic kidney failure. His baseline is around GFR of 65 and he is at 32.  Pending urine culture.  Due to it being Friday, the pain that patient is having, and his acute renal failure, I advised the patient's wife to take him to the ER.  He has been on prednisone for his back pain.    Lab Results  Component Value Date   GFRNONAA 32 (L) 09/18/2019   Lab Results  Component Value Date   CREATININE 2.04 (H) 09/18/2019   BUN 57 (H) 09/18/2019   NA 136 09/18/2019   K 4.9 09/18/2019   CL 97 (L) 09/18/2019   CO2 28 09/18/2019   He is also having worsening SOB with exertion since he has had COVID in July. No swelling in his legs, EKG was normal. His WBC was slightly elevated HOWEVER he has been on prednisone for back pain which can be the cause of this, may benefit from CXR/evaluation as well.

## 2019-09-19 NOTE — ED Provider Notes (Signed)
Phillip Powell   CSN: [id number] Arrival date & time: 09/19/19  1044     History Chief Complaint  Patient presents with  . abnormal labs    Phillip Powell is a 74 y.o. male with PMHx HTN, GERD, diabetes, sarcoidosis, COVID 19 diagnosis July 2020, who presents to the ED today for abnormal labs. Per patient he was told to come to the ED as his kidney function was elevated.   Per chart review pt was seen by his PCP 2/15 for acute right sided back pain for approximately 1 month, atraumatic, which was thought to be musculoskeletal in nature. Pt was prescribed Zanaflex. He returned for follow up visit with PCP yesterday with ongoing back pain not relieved by muscle relaxer as well as complaint of SOB since having COVID in July. Pt's back pain seemed more right sided flank pain yesterday and labwork was obtained including CBC, CMP, TSH, and KUB to ensure no kidney stones. KUB was normal however pt's creatinine was elevated at 2.04 (baseline 1.13). Pt was called by PCP today to come to the ED for further evaluation.   Pt states this right-sided back pain/flank pain has been ongoing since sometime in January.  Patient cannot specify whether it was earlier or end of January however he denies any trauma.  He reports that last week the pain wraps around into his side however that has since resolved.  Patient has had kidney stones in the past however states this feels different.  He does endorse that he has been having some urinary frequency for the past week as well.  Does endorse that he has in fact feels short of breath since his Covid diagnosis however he reports that his wife thinks it has been progressively worsened over the past however he is not quite sure if that is true.  Denies nausea, vomiting, fevers, chills, diarrhea, chest pain, any other additional symptoms.  History of DVT/PE.  No recent prolonged travel or immobilization.  No hemoptysis.  No  exogenous hormone use.  No active malignancy.   The history is provided by the patient and medical records.       Past Medical History:  Diagnosis Date  .  Covid-19 Positive (02/06/2019)  02/11/2019  . BPH (benign prostatic hypertrophy)   . Diabetes mellitus without complication (Phillip Powell)   . GERD (gastroesophageal reflux disease)   . Headache    PMH  . Hypertension   . Hypogonadism male   . Mass    left shoulder AC joint  . OSA (obstructive sleep apnea)    wears CPAP  . PONV (postoperative nausea and vomiting)   . Sarcoidosis   . Vitamin D deficiency     Patient Active Problem List   Diagnosis Date Noted  . CKD stage 3 due to type 2 diabetes mellitus (Phillip Powell) 06/10/2017  . Gluttony 11/26/2016  . Poor compliance with Diet  10/05/2014  . Obesity (BMI 30.0-34.9) 03/02/2014  . Hyperlipidemia associated with type 2 diabetes mellitus (Phillip Powell) 08/18/2013  . Medication management 08/18/2013  . Hypertension   . Type 2 diabetes mellitus ([name])   . GERD (gastroesophageal reflux disease)   . Testosterone Deficiency   . OSA (obstructive sleep apnea)   . Vitamin D deficiency   . BPH (benign prostatic hyperplasia)     Past Surgical History:  Procedure Laterality Date  . APPENDECTOMY    . COLONOSCOPY    . HAND SURGERY Right 2019   Dr. Amedeo Plenty, tendon  repair of R thumb  . HERNIA REPAIR  5093   umbilical  . KNEE ARTHROSCOPY    . ORIF CLAVICULAR FRACTURE Left 03/29/2018   Procedure: LEFT SHOULDER OPEN DISTAL CLAVICLE RESECTION, CYST EXCISION;  Surgeon: Netta Cedars, MD;  Location: [place];  Service: Orthopedics;  Laterality: Left;  . ROTATOR CUFF REPAIR Left 1996  . ROTATOR CUFF REPAIR Right 1997  . TONSILLECTOMY  1952       Family History  Problem Relation Age of Onset  . Thyroid disease Father   . Cancer Father        lung  . Cancer Mother        throat  . Diabetes Mother   . Thyroid disease Sister   . Hypertension Son   . Hyperlipidemia Son   . Cancer Paternal Grandmother         colon    Social History   Tobacco Use  . Smoking status: Never Smoker  . Smokeless tobacco: Never Used  Substance Use Topics  . Alcohol use: Yes    Comment: very rarely  . Drug use: Never    Home Medications Prior to Admission medications   Medication Sig Start Date End Date Taking? Authorizing Provider  aspirin EC 81 MG tablet Take 81 mg by mouth daily.    [provider]  atenolol (TENORMIN) 100 MG tablet Take 1 tablet Daily for BP 07/01/19   Unk Pinto, MD  B-D 3CC LUER-LOK SYR 21GX1" 21G X 1" 3 ML MISC use as directed 10/17/16   Unk Pinto, MD  Blood Glucose Monitoring Suppl (ONETOUCH VERIO) w/Device KIT Test blood sugar once daily. 03/03/19   Liane Comber, NP  Blood Glucose Monitoring Suppl DEVI Use meter three times a day to check blood sugar 05/20/18   Liane Comber, NP  Cholecalciferol (VITAMIN D3) 2000 units TABS Take 8,000 Units by mouth daily.    [provider]  Cinnamon 500 MG capsule Take 1,000 mg by mouth daily.     [provider]  glucose blood (ONETOUCH VERIO) test strip Test blood sugar once daily 03/03/19   Liane Comber, NP  glucose blood test strip Test blood sugar three times a day 05/20/18   Liane Comber, NP  glyBURIDE (DIABETA) 5 MG tablet TAKE 1/2 TO 1 TABLET BY MOUTH THREE TIMES DAILY BEFORE MEALS AS DIRECTED FOR DIABETES 03/24/19   Unk Pinto, MD  hydrochlorothiazide (HYDRODIURIL) 25 MG tablet Take 1 Tablet Daily for BP & Fluid 10/24/18   Unk Pinto, MD  Lancets [facility] ULTRASOFT) lancets Test blood sugar once daily 03/03/19   Liane Comber, NP  losartan (COZAAR) 100 MG tablet Take 1 tablet Daily for BP 07/01/19   Unk Pinto, MD  Magnesium 500 MG TABS Take 0.5 tablets (250 mg total) by mouth 3 (three) times daily. Patient taking differently: Take 500 mg by mouth daily.  05/21/18   Liane Comber, NP  meclizine (ANTIVERT) 25 MG tablet Take 25 mg by mouth 3 (three) times daily as needed for  dizziness.    [provider]  meloxicam (MOBIC) 15 MG tablet Take 1/2 to 1 tablet Daily with food for Pain & Inflammation - limit to 4 -5 days /week to Avoid Kidney Damage 09/01/19   Unk Pinto, MD  methocarbamol (ROBAXIN) 500 MG tablet Take 1 or 2 tablets 4 x /day for Muscle Spasm 09/11/19   Unk Pinto, MD  ondansetron (ZOFRAN) 8 MG tablet Take 1/2 to 1 tablet 3 x day if needed for Nausea  02/08/19   Unk Pinto, MD  sitaGLIPtin-metformin (JANUMET) 50-500 MG tablet Take 1 tablet 2 x /day with a meal for Diabetes Patient taking differently: Take 1 tablet by mouth 2 (two) times daily with a meal. Take 1 tablet 2 x /day with a meal for Diabetes 01/05/19   Unk Pinto, MD  tamsulosin [facility]) 0.4 MG CAPS capsule Take 1 tablet Daily for Prostate 06/15/19   Unk Pinto, MD  tiZANidine (ZANAFLEX) 4 MG tablet Take 1 tablet (4 mg total) by mouth every 8 (eight) hours as needed for muscle spasms. May cause drowsiness, do not take while driving or with alcohol. 09/08/19 09/07/20  Liane Comber, NP  TURMERIC PO Take 1 capsule by mouth daily.    [provider]  vitamin C (ASCORBIC ACID) 500 MG tablet Take 500 mg by mouth 2 (two) times daily.    [provider]  zinc gluconate 50 MG tablet Take 50 mg by mouth daily.    [provider]    Allergies    Ace inhibitors, Ibuprofen, Invokana [canagliflozin], Metformin and related, and Quinidine  Review of Systems   Review of Systems  Constitutional: Negative for chills and fever.  Respiratory: Positive for shortness of breath. Negative for cough.   Cardiovascular: Negative for chest pain.  Gastrointestinal: Negative for abdominal pain, constipation, diarrhea, nausea and vomiting.  Genitourinary: Positive for flank pain and frequency. Negative for dysuria.  All other systems reviewed and are negative.   Physical Exam Updated Vital Signs BP (!) 157/89   Pulse 62   Temp 98.2 F (36.8 C) (Oral)    Resp 17   Ht 5' 8" (1.727 m)   Wt 105.7 kg   SpO2 96%   BMI 35.43 kg/m   Physical Exam Vitals and nursing Powell reviewed.  Constitutional:      Appearance: He is obese. He is not ill-appearing or diaphoretic.  HENT:     Head: Normocephalic and atraumatic.  Eyes:     Conjunctiva/sclera: Conjunctivae normal.  Cardiovascular:     Rate and Rhythm: Normal rate and regular rhythm.     Pulses: Normal pulses.  Pulmonary:     Effort: Pulmonary effort is normal.     Breath sounds: Normal breath sounds. No wheezing, rhonchi or rales.  Chest:     Chest wall: No tenderness.  Abdominal:     Palpations: Abdomen is soft.     Tenderness: There is no abdominal tenderness. There is right CVA tenderness. There is no left CVA tenderness, guarding or rebound.  Musculoskeletal:     Cervical back: Neck supple.     Right lower leg: No edema.     Left lower leg: No edema.  Skin:    General: Skin is warm and dry.  Neurological:     Mental Status: He is alert.     ED Results / Procedures / Treatments   Labs (all labs ordered are listed, but only abnormal results are displayed) Labs Reviewed  COMPREHENSIVE METABOLIC PANEL - Abnormal; Notable for the following components:      Result Value   Sodium 134 (*)    Glucose, Bld 413 (*)    BUN 59 (*)    Creatinine, Ser 2.17 (*)    Total Protein 6.2 (*)    Albumin 3.3 (*)    GFR calc non Af Amer 29 (*)    GFR calc Af Amer 34 (*)    All other components within normal limits  D-DIMER, QUANTITATIVE (NOT AT [facility]) -  Abnormal; Notable for the following components:   D-Dimer, Quant 3.09 (*)    All other components within normal limits  POCT I-STAT EG7 - Abnormal; Notable for the following components:   pCO2, Ven 35.4 (*)    pO2, Ven 73.0 (*)    Sodium 134 (*)    Calcium, Ion 1.13 (*)    All other components within normal limits  CBG MONITORING, ED - Abnormal; Notable for the following components:   Glucose-Capillary 203 (*)    All other components  within normal limits  RESPIRATORY PANEL BY RT PCR (FLU A&B, COVID)  CBC  BRAIN NATRIURETIC PEPTIDE  URINALYSIS, ROUTINE W REFLEX MICROSCOPIC  I-STAT VENOUS BLOOD GAS, ED  TROPONIN I (HIGH SENSITIVITY)  TROPONIN I (HIGH SENSITIVITY)    EKG None  Radiology DG Abd 1 View  Result Date: 09/18/2019 CLINICAL DATA:  RIGHT low back and flank pain since January 2021; history diabetes mellitus, GERD, hypertension, umbilical hernia repair, sarcoidosis EXAM: ABDOMEN - 1 VIEW COMPARISON:  CT abdomen and pelvis 07/16/2016 FINDINGS: Scattered vascular calcifications. Small curvilinear calcification RIGHT paraspinal at L1-L2 unchanged from prior CT. No urinary tract calcifications. Degenerative disc disease changes lumbar spine. Bowel gas pattern normal. IMPRESSION: No acute abnormalities. Electronically Signed   By: Lavonia Dana M.D.   On: 09/18/2019 17:28   DG Chest Port 1 View  Result Date: 09/19/2019 CLINICAL DATA:  Shortness of breath. EXAM: PORTABLE CHEST 1 VIEW COMPARISON:  04/14/2019 FINDINGS: Cardiomediastinal contours are stable. Signs of aortic atherosclerosis. Hilar structures are unremarkable. Lungs are clear. Visualized skeletal structures are unremarkable. IMPRESSION: No acute cardiopulmonary disease. Electronically Signed   By: Zetta Bills M.D.   On: 09/19/2019 13:12   CT Renal Stone Study  Result Date: 09/19/2019 CLINICAL DATA:  Right flank pain EXAM: CT ABDOMEN AND PELVIS WITHOUT CONTRAST TECHNIQUE: Multidetector CT imaging of the abdomen and pelvis was performed following the standard protocol without IV contrast. COMPARISON:  07/16/2016 FINDINGS: Lower chest: Lung bases are clear. Hepatobiliary: Moderate hepatic steatosis. Gallbladder is unremarkable. No intrahepatic or extrahepatic ductal dilatation. Pancreas: Within normal limits. Spleen: The normal limits. Adrenals/Urinary Tract: Adrenal glands are within normal limits. Left kidney is within normal limits. 2 mm nonobstructing  posterior right upper pole renal calculus (series 3/image 44). 6 mm cyst in the anterior right lower kidney (series 3/image 52). Mild right hydroureteronephrosis. Associated 4 mm proximal right ureteral calculus at the L3-4 level (coronal image 61). Bladder is within normal limits. Stomach/Bowel: Stomach is within normal limits. No evidence of bowel obstruction. Normal appendix (series 3/image 66). Vascular/Lymphatic: No evidence of abdominal aortic aneurysm. Atherosclerotic calcifications of the abdominal aorta and branch vessels. No suspicious abdominopelvic lymphadenopathy. Reproductive: Prostate is unremarkable. Other: No abdominopelvic ascites. Musculoskeletal: Degenerative changes of the visualized thoracolumbar spine. IMPRESSION: 4 mm proximal right ureteral calculus at the L3-4 level. Mild right hydroureteronephrosis. Additional 2 mm nonobstructing right upper pole renal calculus. Additional ancillary findings as above. Electronically Signed   By: Julian Hy M.D.   On: 09/19/2019 14:58    Procedures Procedures (including critical care time)  Medications Ordered in ED Medications  sodium chloride flush (NS) 0.9 % injection 3 mL (3 mLs Intravenous Given 09/19/19 1323)  sodium chloride 0.9 % bolus 1,000 mL (0 mLs Intravenous Stopped 09/19/19 1513)  insulin aspart (novoLOG) injection 10 Units (10 Units Intravenous Given 09/19/19 1318)    ED Course  I have reviewed the triage vital signs and the nursing notes.  Pertinent labs & imaging results that were  available during my care of the patient were reviewed by me and considered in my medical decision making (see chart for details).  Clinical Course as of Sep 18 1534  Fri Sep 19, 2019  1354 D-Dimer, Quant(!): 3.09 [MV]  1401 Glucose(!): 413 [MV]  1402 Creatinine(!): 2.17 [MV]  1524 1 month of flank pain. Awaiting V/Q scan. Received fluids for AKI and has an obstructive kidney stone. Patient wants to go home. Awaiting urinalysis and v/q  scan. May be discharged if v/q scan normal and urology f/u   [KM]    Clinical Course User Index [KM] Alveria Apley, PA-C [MV] Eustaquio Maize, PA-C   74 year old male who presents the ED after receiving critical lab value from PCP.  Reports worsening renal function.  Renal function yesterday at PCPs office 2.04.  She has been having some right-sided flank pain/back pain for approximately 1 month as well and feeling more short of breath, diagnosed with COVID-19 in July 2020.  On arrival to the ED patient is afebrile, nontachycardic and nontachypneic.  He is satting 97% on room air.  He is denying any history of DVT/PE however given he has had Covid will obtain D-dimer.   CMP returned with worsening renal function 2.17 today from 2.04 yesterday. Fluids ordered. Glucose 413, potassium 4.6. Will give IV insulin and reevaluate. No gap but will obtain VBG given hx of diabetes.   CBG with pH 7.408. Bicarb 22.4.   Initial troponin of 3. BNP 84.3. D dimer 3.09 (0.64 during COVID diagnosis in July). Unfortunately given pt's kidney function CTA not an option. Pt will need VQ scan. Will swab for COVID at this time for VQ scan.   CT scan with 4 mm ureteral stone with hydro. May be causing pt's AKI. He already has flomax at home which he takes daily as it "helps me urinate better." Pt does have documented BPH however he states he was not aware of this diagnosis.   Repeat CBG 203.   Awaiting urinalysis at this time. No signs of infection yesterday however may be different today. If any signs of infection urology may need to be consulted. Had shared decision making with patient today regarding admission vs outpatient follow up with his PCP. Pt is adamant that he wants to go home. He is in agreement that he will return if any worsening condition. If VQ scan unremarkable pt can be discharged home with advisement to increasing oral intake and have kidney function rechecked by PCP early next week.   3:31 PM At  shift change case signed out to Madilyn Hook, PA-C, who will dispo patient accordingly after VQ scan and urinalysis return. If VQ scan negative pt to be discharged home. If VQ scan concerning for PE pt will need to be admitted.   This Powell was prepared using Dragon voice recognition software and may include unintentional dictation errors due to the inherent limitations of voice recognition software.   MDM Rules/Calculators/A&P                       Final Clinical Impression(s) / ED Diagnoses Final diagnoses:  AKI (acute kidney injury) ([place])  Ureteral stone with hydronephrosis  Shortness of breath    Rx / DC Orders ED Discharge Orders    None       Eustaquio Maize, PA-C 09/19/19 1536    Isla Pence, MD 09/25/19 1214

## 2019-09-19 NOTE — ED Notes (Signed)
Pt transported to CT ?

## 2019-09-20 ENCOUNTER — Inpatient Hospital Stay (HOSPITAL_COMMUNITY)
Admission: EM | Admit: 2019-09-20 | Discharge: 2019-09-23 | DRG: 684 | Disposition: A | Discharge status: Home / Self Care | Payer: Managed Care, Other (non HMO) | Attending: Internal Medicine | Admitting: Internal Medicine

## 2019-09-20 ENCOUNTER — Encounter (HOSPITAL_COMMUNITY): Payer: Self-pay | Admitting: Emergency Medicine

## 2019-09-20 ENCOUNTER — Other Ambulatory Visit: Payer: Self-pay

## 2019-09-20 DIAGNOSIS — Z8349 Family history of other endocrine, nutritional and metabolic diseases: Secondary | ICD-10-CM

## 2019-09-20 DIAGNOSIS — Z7984 Long term (current) use of oral hypoglycemic drugs: Secondary | ICD-10-CM

## 2019-09-20 DIAGNOSIS — K219 Gastro-esophageal reflux disease without esophagitis: Secondary | ICD-10-CM | POA: Diagnosis present

## 2019-09-20 DIAGNOSIS — G4733 Obstructive sleep apnea (adult) (pediatric): Secondary | ICD-10-CM | POA: Diagnosis present

## 2019-09-20 DIAGNOSIS — Z6835 Body mass index (BMI) 35.0-35.9, adult: Secondary | ICD-10-CM

## 2019-09-20 DIAGNOSIS — N200 Calculus of kidney: Secondary | ICD-10-CM

## 2019-09-20 DIAGNOSIS — E1165 Type 2 diabetes mellitus with hyperglycemia: Secondary | ICD-10-CM

## 2019-09-20 DIAGNOSIS — E669 Obesity, unspecified: Secondary | ICD-10-CM | POA: Diagnosis present

## 2019-09-20 DIAGNOSIS — Z791 Long term (current) use of non-steroidal anti-inflammatories (NSAID): Secondary | ICD-10-CM

## 2019-09-20 DIAGNOSIS — N400 Enlarged prostate without lower urinary tract symptoms: Secondary | ICD-10-CM | POA: Diagnosis present

## 2019-09-20 DIAGNOSIS — Z888 Allergy status to other drugs, medicaments and biological substances status: Secondary | ICD-10-CM

## 2019-09-20 DIAGNOSIS — N179 Acute kidney failure, unspecified: Secondary | ICD-10-CM | POA: Diagnosis not present

## 2019-09-20 DIAGNOSIS — I129 Hypertensive chronic kidney disease with stage 1 through stage 4 chronic kidney disease, or unspecified chronic kidney disease: Secondary | ICD-10-CM | POA: Diagnosis present

## 2019-09-20 DIAGNOSIS — I10 Essential (primary) hypertension: Secondary | ICD-10-CM | POA: Diagnosis present

## 2019-09-20 DIAGNOSIS — Z8616 Personal history of COVID-19: Secondary | ICD-10-CM

## 2019-09-20 DIAGNOSIS — E66811 Obesity, class 1: Secondary | ICD-10-CM | POA: Diagnosis present

## 2019-09-20 DIAGNOSIS — Z79899 Other long term (current) drug therapy: Secondary | ICD-10-CM

## 2019-09-20 DIAGNOSIS — K760 Fatty (change of) liver, not elsewhere classified: Secondary | ICD-10-CM | POA: Diagnosis present

## 2019-09-20 DIAGNOSIS — E1122 Type 2 diabetes mellitus with diabetic chronic kidney disease: Secondary | ICD-10-CM | POA: Diagnosis present

## 2019-09-20 DIAGNOSIS — Z886 Allergy status to analgesic agent status: Secondary | ICD-10-CM

## 2019-09-20 DIAGNOSIS — N1330 Unspecified hydronephrosis: Secondary | ICD-10-CM | POA: Diagnosis present

## 2019-09-20 DIAGNOSIS — Z7982 Long term (current) use of aspirin: Secondary | ICD-10-CM

## 2019-09-20 DIAGNOSIS — E119 Type 2 diabetes mellitus without complications: Secondary | ICD-10-CM

## 2019-09-20 DIAGNOSIS — N182 Chronic kidney disease, stage 2 (mild): Secondary | ICD-10-CM | POA: Diagnosis present

## 2019-09-20 DIAGNOSIS — N132 Hydronephrosis with renal and ureteral calculous obstruction: Secondary | ICD-10-CM | POA: Diagnosis present

## 2019-09-20 HISTORY — DX: Calculus of kidney: N20.0

## 2019-09-20 HISTORY — DX: Fatty (change of) liver, not elsewhere classified: K76.0

## 2019-09-20 HISTORY — DX: Unspecified hydronephrosis: N13.30

## 2019-09-20 LAB — CBC
HCT: 44.3 % (ref 39.0–52.0)
Hemoglobin: 15.3 g/dL (ref 13.0–17.0)
MCH: 27.7 pg (ref 26.0–34.0)
MCHC: 34.5 g/dL (ref 30.0–36.0)
MCV: 80.3 fL (ref 80.0–100.0)
Platelets: 167 10*3/uL (ref 150–400)
RBC: 5.52 MIL/uL (ref 4.22–5.81)
RDW: 13.1 % (ref 11.5–15.5)
WBC: 11.8 10*3/uL — ABNORMAL HIGH (ref 4.0–10.5)
nRBC: 0 % (ref 0.0–0.2)

## 2019-09-20 LAB — URIC ACID: Uric Acid, Serum: 7.7 mg/dL (ref 3.7–8.6)

## 2019-09-20 LAB — BASIC METABOLIC PANEL
Anion gap: 11 (ref 5–15)
BUN: 50 mg/dL — ABNORMAL HIGH (ref 8–23)
CO2: 22 mmol/L (ref 22–32)
Calcium: 8.7 mg/dL — ABNORMAL LOW (ref 8.9–10.3)
Chloride: 97 mmol/L — ABNORMAL LOW (ref 98–111)
Creatinine, Ser: 2.09 mg/dL — ABNORMAL HIGH (ref 0.61–1.24)
GFR calc Af Amer: 36 mL/min — ABNORMAL LOW (ref >60)
GFR calc non Af Amer: 31 mL/min — ABNORMAL LOW (ref >60)
Glucose, Bld: 256 mg/dL — ABNORMAL HIGH (ref 70–99)
Potassium: 4.2 mmol/L (ref 3.5–5.1)
Sodium: 130 mmol/L — ABNORMAL LOW (ref 135–145)

## 2019-09-20 LAB — GLUCOSE, CAPILLARY: Glucose-Capillary: 436 mg/dL — ABNORMAL HIGH (ref 70–99)

## 2019-09-20 LAB — CBG MONITORING, ED: Glucose-Capillary: 290 mg/dL — ABNORMAL HIGH (ref 70–99)

## 2019-09-20 MED ORDER — SODIUM CHLORIDE 0.9 % IV BOLUS
500.0000 mL | Freq: Once | INTRAVENOUS | Status: AC
  Administered 2019-09-20: 500 mL via INTRAVENOUS

## 2019-09-20 MED ORDER — GLYBURIDE 2.5 MG PO TABS
2.5000 mg | ORAL_TABLET | Freq: Three times a day (TID) | ORAL | Status: DC
  Administered 2019-09-20: 2.5 mg via ORAL
  Filled 2019-09-20 (×4): qty 1

## 2019-09-20 MED ORDER — HEPARIN SODIUM (PORCINE) 5000 UNIT/ML IJ SOLN
5000.0000 [IU] | Freq: Two times a day (BID) | INTRAMUSCULAR | Status: DC
  Administered 2019-09-20 – 2019-09-23 (×6): 5000 [IU] via SUBCUTANEOUS
  Filled 2019-09-20 (×6): qty 1

## 2019-09-20 MED ORDER — INSULIN ASPART 100 UNIT/ML ~~LOC~~ SOLN
0.0000 [IU] | Freq: Three times a day (TID) | SUBCUTANEOUS | Status: DC
  Administered 2019-09-20 – 2019-09-21 (×2): 5 [IU] via SUBCUTANEOUS
  Administered 2019-09-21: 2 [IU] via SUBCUTANEOUS
  Administered 2019-09-21 – 2019-09-22 (×2): 3 [IU] via SUBCUTANEOUS

## 2019-09-20 MED ORDER — ACETAMINOPHEN 650 MG RE SUPP: 650.0000 mg | Freq: Four times a day (QID) | RECTAL | Status: DC | PRN

## 2019-09-20 MED ORDER — ASCORBIC ACID 500 MG PO TABS
500.0000 mg | ORAL_TABLET | Freq: Two times a day (BID) | ORAL | Status: DC
  Administered 2019-09-20 – 2019-09-23 (×6): 500 mg via ORAL
  Filled 2019-09-20 (×6): qty 1

## 2019-09-20 MED ORDER — VITAMIN D 25 MCG (1000 UNIT) PO TABS
8000.0000 [IU] | ORAL_TABLET | Freq: Every day | ORAL | Status: DC
  Filled 2019-09-20 (×3): qty 8

## 2019-09-20 MED ORDER — ASPIRIN EC 81 MG PO TBEC
81.0000 mg | DELAYED_RELEASE_TABLET | Freq: Every day | ORAL | Status: DC
  Administered 2019-09-20 – 2019-09-23 (×4): 81 mg via ORAL
  Filled 2019-09-20 (×4): qty 1

## 2019-09-20 MED ORDER — TAMSULOSIN HCL 0.4 MG PO CAPS
0.4000 mg | ORAL_CAPSULE | Freq: Every day | ORAL | Status: DC
  Administered 2019-09-21 – 2019-09-23 (×3): 0.4 mg via ORAL
  Filled 2019-09-20 (×3): qty 1

## 2019-09-20 MED ORDER — ALUM & MAG HYDROXIDE-SIMETH 200-200-20 MG/5ML PO SUSP: 30.0000 mL | Freq: Four times a day (QID) | ORAL | Status: DC | PRN

## 2019-09-20 MED ORDER — ATENOLOL 50 MG PO TABS
100.0000 mg | ORAL_TABLET | Freq: Every day | ORAL | Status: DC
  Administered 2019-09-21 – 2019-09-23 (×3): 100 mg via ORAL
  Filled 2019-09-20 (×3): qty 2

## 2019-09-20 MED ORDER — AMLODIPINE BESYLATE 5 MG PO TABS
5.0000 mg | ORAL_TABLET | Freq: Every day | ORAL | Status: DC
  Administered 2019-09-21: 08:00:00 5 mg via ORAL
  Filled 2019-09-20: qty 1

## 2019-09-20 MED ORDER — HYDRALAZINE HCL 25 MG PO TABS: 25.0000 mg | ORAL_TABLET | Freq: Four times a day (QID) | ORAL | Status: DC | PRN

## 2019-09-20 MED ORDER — LINAGLIPTIN 5 MG PO TABS
5.0000 mg | ORAL_TABLET | Freq: Every day | ORAL | Status: DC
  Administered 2019-09-20 – 2019-09-23 (×4): 5 mg via ORAL
  Filled 2019-09-20 (×4): qty 1

## 2019-09-20 MED ORDER — MAGNESIUM OXIDE 400 (241.3 MG) MG PO TABS
400.0000 mg | ORAL_TABLET | Freq: Every day | ORAL | Status: DC
  Administered 2019-09-20 – 2019-09-23 (×4): 400 mg via ORAL
  Filled 2019-09-20 (×4): qty 1

## 2019-09-20 MED ORDER — FAMOTIDINE 20 MG PO TABS
40.0000 mg | ORAL_TABLET | Freq: Every day | ORAL | Status: DC
  Administered 2019-09-20 – 2019-09-22 (×3): 40 mg via ORAL
  Filled 2019-09-20 (×3): qty 2

## 2019-09-20 MED ORDER — MECLIZINE HCL 25 MG PO TABS
25.0000 mg | ORAL_TABLET | Freq: Three times a day (TID) | ORAL | Status: DC | PRN
  Filled 2019-09-20: qty 1

## 2019-09-20 MED ORDER — OXYCODONE HCL 5 MG PO TABS
5.0000 mg | ORAL_TABLET | ORAL | Status: DC | PRN
  Administered 2019-09-22: 5 mg via ORAL
  Filled 2019-09-20: qty 1

## 2019-09-20 MED ORDER — ACETAMINOPHEN 325 MG PO TABS
650.0000 mg | ORAL_TABLET | Freq: Four times a day (QID) | ORAL | Status: DC | PRN
  Administered 2019-09-22: 650 mg via ORAL
  Filled 2019-09-20 (×2): qty 2

## 2019-09-20 MED ORDER — METHOCARBAMOL 500 MG PO TABS: 500.0000 mg | ORAL_TABLET | Freq: Four times a day (QID) | ORAL | Status: DC | PRN

## 2019-09-20 MED ORDER — ZINC GLUCONATE 50 MG PO TABS: 50.0000 mg | ORAL_TABLET | Freq: Every day | ORAL | Status: DC

## 2019-09-20 MED ORDER — MELOXICAM 7.5 MG PO TABS
7.5000 mg | ORAL_TABLET | Freq: Every day | ORAL | Status: DC
  Filled 2019-09-20 (×2): qty 1

## 2019-09-20 MED ORDER — SODIUM CHLORIDE 0.9 % IV SOLN: INTRAVENOUS | Status: DC

## 2019-09-20 NOTE — Progress Notes (Signed)
Patient admitted from ED. He ambulated from the stretcher to the chair, he is Ax4. IVF initiated per orders. Vitals stable and he denies pain at this time.

## 2019-09-20 NOTE — ED Notes (Signed)
Wife Fraser Din would like an update. [phone]

## 2019-09-20 NOTE — ED Triage Notes (Signed)
Patient states he was sent here yesterday by his PCP due to abnormal labs indicating an AKI, states he was treated in the ED and sent home last night, per patient, his PCP spoke with him this morning and felt that he needed to come back for further treatment and possible iv fluids. Pt reports ongoing urinary frequency.

## 2019-09-20 NOTE — ED Notes (Addendum)
Pt given water, sandwich, and crackers for PO/fluid challenge.

## 2019-09-20 NOTE — ED Provider Notes (Signed)
[facility] EMERGENCY DEPARTMENT Provider Note   CSN: [id number] Arrival date & time: 09/20/19  1236     History Chief Complaint  Patient presents with  . Abnormal Lab    CHRISTOPHR CALIX is a 74 y.o. male.  Patient is a 74 year old male who presents as a return from yesterday after consulting with his physician about his abnormal creatinine.  He states that he has had pain in his right flank area for about a month.  He was seen for this by his PCP and treated symptomatically with no improvement.  He was noted to have an increase in his creatinine to 2.04.  He was sent to the emergency room yesterday where his creatinine was noted to be 2.17.  On imaging studies he was noted to have a 4 mm obstructing proximal right ureteral stone.  He was hydrated in the ED and per notes in the patient, a discussion was made about admitting the patient versus having him go home and he was adamant that he did not want to be admitted.  The patient says that his wife called his doctor this morning and told him to come back to the emergency room for further treatment of his acute kidney injury.  Patient denies any vomiting.  No difficulty urinating.  No fevers.  He says his flank pain is essentially stayed in the same position, it has not moved at all over the last several weeks.  He has had a mild increase in shortness of breath since the Covid diagnosis in the past although he denies any current shortness of breath.  He was evaluated for PE yesterday and this was noted to be low probability on V/Q imaging.        Past Medical History:  Diagnosis Date  .  Covid-19 Positive (02/06/2019)  02/11/2019  . BPH (benign prostatic hypertrophy)   . Diabetes mellitus without complication ([place])   . GERD (gastroesophageal reflux disease)   . Headache    PMH  . Hypertension   . Hypogonadism male   . Mass    left shoulder AC joint  . OSA (obstructive sleep apnea)    wears CPAP  . PONV  (postoperative nausea and vomiting)   . Sarcoidosis   . Vitamin D deficiency     Patient Active Problem List   Diagnosis Date Noted  . CKD stage 3 due to type 2 diabetes mellitus ([place]) 06/10/2017  . Gluttony 11/26/2016  . Poor compliance with Diet  10/05/2014  . Obesity (BMI 30.0-34.9) 03/02/2014  . Hyperlipidemia associated with type 2 diabetes mellitus ([place]) 08/18/2013  . Medication management 08/18/2013  . Hypertension   . Type 2 diabetes mellitus ([place])   . GERD (gastroesophageal reflux disease)   . Testosterone Deficiency   . OSA (obstructive sleep apnea)   . Vitamin D deficiency   . BPH (benign prostatic hyperplasia)     Past Surgical History:  Procedure Laterality Date  . APPENDECTOMY    . COLONOSCOPY    . HAND SURGERY Right 2019   Dr. Amedeo Plenty, tendon repair of R thumb  . HERNIA REPAIR  0258   umbilical  . KNEE ARTHROSCOPY    . ORIF CLAVICULAR FRACTURE Left 03/29/2018   Procedure: LEFT SHOULDER OPEN DISTAL CLAVICLE RESECTION, CYST EXCISION;  Surgeon: Netta Cedars, MD;  Location: [facility];  Service: Orthopedics;  Laterality: Left;  . ROTATOR CUFF REPAIR Left 1996  . ROTATOR CUFF REPAIR Right 1997  . TONSILLECTOMY  1952  Family History  Problem Relation Age of Onset  . Thyroid disease Father   . Cancer Father        lung  . Cancer Mother        throat  . Diabetes Mother   . Thyroid disease Sister   . Hypertension Son   . Hyperlipidemia Son   . Cancer Paternal Grandmother        colon    Social History   Tobacco Use  . Smoking status: Never Smoker  . Smokeless tobacco: Never Used  Substance Use Topics  . Alcohol use: Yes    Comment: very rarely  . Drug use: Never    Home Medications Prior to Admission medications   Medication Sig Start Date End Date Taking? Authorizing Provider  aspirin EC 81 MG tablet Take 81 mg by mouth daily.    [provider]  atenolol (TENORMIN) 100 MG tablet Take 1 tablet Daily for BP Patient taking  differently: Take 100 mg by mouth daily.  07/01/19   Unk Pinto, MD  B-D 3CC LUER-LOK SYR 62VO3" 21G X 1" 3 ML MISC use as directed 10/17/16   Unk Pinto, MD  Blood Glucose Monitoring Suppl (ONETOUCH VERIO) w/Device KIT Test blood sugar once daily. 03/03/19   Liane Comber, NP  Blood Glucose Monitoring Suppl DEVI Use meter three times a day to check blood sugar 05/20/18   Liane Comber, NP  Cholecalciferol (VITAMIN D3) 2000 units TABS Take 8,000 Units by mouth daily.    [provider]  Cinnamon 500 MG capsule Take 1,000 mg by mouth daily.     [provider]  glucose blood (ONETOUCH VERIO) test strip Test blood sugar once daily 03/03/19   Liane Comber, NP  glucose blood test strip Test blood sugar three times a day 05/20/18   Liane Comber, NP  glyBURIDE (DIABETA) 5 MG tablet TAKE 1/2 TO 1 TABLET BY MOUTH THREE TIMES DAILY BEFORE MEALS AS DIRECTED FOR DIABETES 03/24/19   Unk Pinto, MD  hydrochlorothiazide (HYDRODIURIL) 25 MG tablet Take 1 Tablet Daily for BP & Fluid 10/24/18   Unk Pinto, MD  Lancets [facility] ULTRASOFT) lancets Test blood sugar once daily 03/03/19   Liane Comber, NP  losartan (COZAAR) 100 MG tablet Take 1 tablet Daily for BP 07/01/19   Unk Pinto, MD  Magnesium 500 MG TABS Take 0.5 tablets (250 mg total) by mouth 3 (three) times daily. Patient taking differently: Take 500 mg by mouth daily.  05/21/18   Liane Comber, NP  meclizine (ANTIVERT) 25 MG tablet Take 25 mg by mouth 3 (three) times daily as needed for dizziness.    [provider]  meloxicam (MOBIC) 15 MG tablet Take 1/2 to 1 tablet Daily with food for Pain & Inflammation - limit to 4 -5 days /week to Avoid Kidney Damage 09/01/19   Unk Pinto, MD  methocarbamol (ROBAXIN) 500 MG tablet Take 1 or 2 tablets 4 x /day for Muscle Spasm 09/11/19   Unk Pinto, MD  ondansetron [facility]) 8 MG tablet Take 1/2 to 1 tablet 3 x day if needed for Nausea 02/08/19    Unk Pinto, MD  sitaGLIPtin-metformin (JANUMET) 50-500 MG tablet Take 1 tablet 2 x /day with a meal for Diabetes Patient taking differently: Take 1 tablet by mouth 2 (two) times daily with a meal. Take 1 tablet 2 x /day with a meal for Diabetes 01/05/19   Unk Pinto, MD  tamsulosin (FLOMAX) 0.4 MG CAPS capsule Take 1 tablet Daily for  Prostate 06/15/19   Unk Pinto, MD  tiZANidine (ZANAFLEX) 4 MG tablet Take 1 tablet (4 mg total) by mouth every 8 (eight) hours as needed for muscle spasms. May cause drowsiness, do not take while driving or with alcohol. 09/08/19 09/07/20  Liane Comber, NP  TURMERIC PO Take 1 capsule by mouth daily.    [provider]  vitamin C (ASCORBIC ACID) 500 MG tablet Take 500 mg by mouth 2 (two) times daily.    [provider]  zinc gluconate 50 MG tablet Take 50 mg by mouth daily.    [provider]    Allergies    Ace inhibitors, Ibuprofen, Invokana [canagliflozin], Metformin and related, and Quinidine  Review of Systems   Review of Systems  Constitutional: Negative for chills, diaphoresis, fatigue and fever.  HENT: Negative for congestion, rhinorrhea and sneezing.   Eyes: Negative.   Respiratory: Positive for shortness of breath. Negative for cough and chest tightness.   Cardiovascular: Negative for chest pain and leg swelling.  Gastrointestinal: Negative for abdominal pain, blood in stool, diarrhea, nausea and vomiting.  Genitourinary: Positive for flank pain. Negative for difficulty urinating, frequency and hematuria.  Musculoskeletal: Positive for back pain. Negative for arthralgias.  Skin: Negative for rash.  Neurological: Negative for dizziness, speech difficulty, weakness, numbness and headaches.    Physical Exam Updated Vital Signs BP 136/78 (BP Location: Left Arm)   Pulse 75   Temp 97.9 F (36.6 C) (Oral)   Resp 16   SpO2 97%   Physical Exam Constitutional:      Appearance: He is well-developed.    HENT:     Head: Normocephalic and atraumatic.  Eyes:     Pupils: Pupils are equal, round, and reactive to light.  Cardiovascular:     Rate and Rhythm: Normal rate and regular rhythm.     Heart sounds: Normal heart sounds.  Pulmonary:     Effort: Pulmonary effort is normal. No respiratory distress.     Breath sounds: Normal breath sounds. No wheezing or rales.  Chest:     Chest wall: No tenderness.  Abdominal:     General: Bowel sounds are normal.     Palpations: Abdomen is soft.     Tenderness: There is no abdominal tenderness. There is no guarding or rebound.  Musculoskeletal:        General: Normal range of motion.     Cervical back: Normal range of motion and neck supple.  Lymphadenopathy:     Cervical: No cervical adenopathy.  Skin:    General: Skin is warm and dry.     Findings: No rash.  Neurological:     Mental Status: He is alert and oriented to person, place, and time.     ED Results / Procedures / Treatments   Labs (all labs ordered are listed, but only abnormal results are displayed) Labs Reviewed  BASIC METABOLIC PANEL - Abnormal; Notable for the following components:      Result Value   Sodium 130 (*)    Chloride 97 (*)    Glucose, Bld 256 (*)    BUN 50 (*)    Creatinine, Ser 2.09 (*)    Calcium 8.7 (*)    GFR calc non Af Amer 31 (*)    GFR calc Af Amer 36 (*)    All other components within normal limits  CBC - Abnormal; Notable for the following components:   WBC 11.8 (*)    All other components within normal limits  EKG None  Radiology NM PULMONARY VENT AND PERF (V/Q Scan)  Result Date: 09/19/2019 CLINICAL DATA:  Suspected pulmonary embolism. COVID-19 diagnosis in July 2020. Early renal failure. EXAM: NUCLEAR MEDICINE VENTILATION - PERFUSION LUNG SCAN TECHNIQUE: Ventilation images were obtained in multiple projections using inhaled aerosol Tc-33mDTPA. Perfusion images were obtained in multiple projections after intravenous injection of Tc-980mMAA. RADIOPHARMACEUTICALS:  39.7 mCi of Tc-9956mPA aerosol inhalation and 1.55 mCi Tc99m49m IV COMPARISON:  None.  Chest radiography performed on the same date. FINDINGS: Ventilation: A couple peripheral subsegmental defects in the left upper lobe and adjacent the left major fissure with corresponding perfusion defects, but no corresponding radiographic abnormality. Perfusion: Two subsegmental wedge-shaped peripheral perfusion defects in the left upper lobe without corresponding abnormality on ventilation scintigraphy or radiography. Matching peripheral subsegmental defects in the left upper lobe and adjacent the left major fissure with corresponding ventilation defects, as described above. IMPRESSION: Low probability for pulmonary embolism according to the modified PIOPED II criteria. Electronically Signed   By: RachRevonda Humphreyn: 09/19/2019 18:04   DG Chest Port 1 View  Result Date: 09/19/2019 CLINICAL DATA:  Shortness of breath. EXAM: PORTABLE CHEST 1 VIEW COMPARISON:  04/14/2019 FINDINGS: Cardiomediastinal contours are stable. Signs of aortic atherosclerosis. Hilar structures are unremarkable. Lungs are clear. Visualized skeletal structures are unremarkable. IMPRESSION: No acute cardiopulmonary disease. Electronically Signed   By: GeofZetta Bills.   On: 09/19/2019 13:12   CT Renal Stone Study  Result Date: 09/19/2019 CLINICAL DATA:  Right flank pain EXAM: CT ABDOMEN AND PELVIS WITHOUT CONTRAST TECHNIQUE: Multidetector CT imaging of the abdomen and pelvis was performed following the standard protocol without IV contrast. COMPARISON:  07/16/2016 FINDINGS: Lower chest: Lung bases are clear. Hepatobiliary: Moderate hepatic steatosis. Gallbladder is unremarkable. No intrahepatic or extrahepatic ductal dilatation. Pancreas: Within normal limits. Spleen: The normal limits. Adrenals/Urinary Tract: Adrenal glands are within normal limits. Left kidney is within normal limits. 2 mm nonobstructing posterior  right upper pole renal calculus (series 3/image 44). 6 mm cyst in the anterior right lower kidney (series 3/image 52). Mild right hydroureteronephrosis. Associated 4 mm proximal right ureteral calculus at the L3-4 level (coronal image 61). Bladder is within normal limits. Stomach/Bowel: Stomach is within normal limits. No evidence of bowel obstruction. Normal appendix (series 3/image 66). Vascular/Lymphatic: No evidence of abdominal aortic aneurysm. Atherosclerotic calcifications of the abdominal aorta and branch vessels. No suspicious abdominopelvic lymphadenopathy. Reproductive: Prostate is unremarkable. Other: No abdominopelvic ascites. Musculoskeletal: Degenerative changes of the visualized thoracolumbar spine. IMPRESSION: 4 mm proximal right ureteral calculus at the L3-4 level. Mild right hydroureteronephrosis. Additional 2 mm nonobstructing right upper pole renal calculus. Additional ancillary findings as above. Electronically Signed   By: SriyJulian Hy.   On: 09/19/2019 14:58    Procedures Procedures (including critical care time)  Medications Ordered in ED Medications  sodium chloride 0.9 % bolus 500 mL (500 mLs Intravenous New Bag/Given 09/20/19 1558)    ED Course  I have reviewed the triage vital signs and the nursing notes.  Pertinent labs & imaging results that were available during my care of the patient were reviewed by me and considered in my medical decision making (see chart for details).    MDM Rules/Calculators/A&P                     Patient is a 73 y82r old male who presents with an abnormal creatinine.  He was given 1 L IV fluid yesterday.  He was given 500 cc today.  His CT scan from yesterday did show a 5 mm proximal obstructing ureteral stone.  I spoke with Dr. Junious Silk with urology who advises that emergent treatment is not needed for the stone and the patient can call the office on Monday to schedule definitive treatment.  I did discuss this with Dr. Melford Aase who  is the patient's PCP.  He is concerned about the patient's elevation in creatinine association with his diabetes and feels that patient may benefit from hospitalization.  I spoke with Dr. Roosevelt Locks who admit the patient for observation.  Patient's wife was updated. Final Clinical Impression(s) / ED Diagnoses Final diagnoses:  AKI (acute kidney injury) ([place])  Kidney stone    Rx / DC Orders ED Discharge Orders    None       Malvin Johns, MD 09/20/19 1614

## 2019-09-20 NOTE — H&P (Signed)
History and Physical    Phillip Powell TDV:[id number] DOB: 22-Nov-1945 DOA: 09/20/2019  PCP: Unk Pinto, MD   Patient coming from: Home  I have personally briefly reviewed patient's old medical records in [place]  Chief Complaint: Right flank pain  HPI: Phillip Powell is a 74 y.o. male with medical history significant of was sent by his PCP second time since yesterday for AKI. Baseline Cre 1.1, yesterday 2.0 and today 2.1. He started to have right flank pain about one month ago.  He was seen by PCP and treated with muscle relaxant for about 2 weeks which helped. But the right flank pain came back one week ago. And he went to see his PCP yesterday when is was found his creatinine jump to 2.04.  He was sent to the emergency room yesterday where his creatinine was noted to be 2.17.  On imaging studies he was noted to have a 4 mm obstructing proximal right ureteral stone with mild hydro.  He was given 1 Liter on IV bolus ED and per notes in the patient, V/Q scan was negative. ED physician tried to persuade patient to stay but pt instead decided to go home. PCP called today and sent pt again to ER.  Patient denied any difficulty urinating fevers.   ED Course: Urology made aware and says no emergency intervention needed.  Review of Systems: As per HPI otherwise 10 point review of systems negative.    Past Medical History:  Diagnosis Date  .  Covid-19 Positive (02/06/2019)  02/11/2019  . BPH (benign prostatic hypertrophy)   . Diabetes mellitus without complication ([place])   . GERD (gastroesophageal reflux disease)   . Headache    PMH  . Hypertension   . Hypogonadism male   . Mass    left shoulder AC joint  . OSA (obstructive sleep apnea)    wears CPAP  . PONV (postoperative nausea and vomiting)   . Sarcoidosis   . Vitamin D deficiency     Past Surgical History:  Procedure Laterality Date  . APPENDECTOMY    . COLONOSCOPY    . HAND SURGERY Right 2019   Dr. Amedeo Plenty,  tendon repair of R thumb  . HERNIA REPAIR  0626   umbilical  . KNEE ARTHROSCOPY    . ORIF CLAVICULAR FRACTURE Left 03/29/2018   Procedure: LEFT SHOULDER OPEN DISTAL CLAVICLE RESECTION, CYST EXCISION;  Surgeon: Netta Cedars, MD;  Location: [facility];  Service: Orthopedics;  Laterality: Left;  . ROTATOR CUFF REPAIR Left 1996  . ROTATOR CUFF REPAIR Right 1997  . TONSILLECTOMY  1952     reports that he has never smoked. He has never used smokeless tobacco. He reports current alcohol use. He reports that he does not use drugs.  Allergies  Allergen Reactions  . Ace Inhibitors Other (See Comments)    Unknown  . Ibuprofen Other (See Comments)    GI upset  . Invokana [Canagliflozin] Other (See Comments)    Unknown  . Metformin And Related Diarrhea  . Quinidine Other (See Comments)    Unknown    Family History  Problem Relation Age of Onset  . Thyroid disease Father   . Cancer Father        lung  . Cancer Mother        throat  . Diabetes Mother   . Thyroid disease Sister   . Hypertension Son   . Hyperlipidemia Son   . Cancer Paternal Grandmother  colon     Prior to Admission medications   Medication Sig Start Date End Date Taking? Authorizing Provider  aspirin EC 81 MG tablet Take 81 mg by mouth daily.   Yes [provider]  atenolol (TENORMIN) 100 MG tablet Take 1 tablet Daily for BP Patient taking differently: Take 100 mg by mouth daily.  07/01/19  Yes Unk Pinto, MD  Cholecalciferol (VITAMIN D3) 2000 units TABS Take 8,000 Units by mouth daily.   Yes [provider]  Cinnamon 500 MG capsule Take 1,000 mg by mouth daily.    Yes [provider]  famotidine (PEPCID) 40 MG tablet Take 40 mg by mouth at bedtime. 09/01/19  Yes [provider]  glyBURIDE (DIABETA) 5 MG tablet TAKE 1/2 TO 1 TABLET BY MOUTH THREE TIMES DAILY BEFORE MEALS AS DIRECTED FOR DIABETES Patient taking differently: Take 2.5-5 mg by mouth See admin instructions.   03/24/19  Yes Unk Pinto, MD  hydrochlorothiazide (HYDRODIURIL) 25 MG tablet Take 1 Tablet Daily for BP & Fluid Patient taking differently: Take 25 mg by mouth daily.  10/24/18  Yes Unk Pinto, MD  losartan (COZAAR) 100 MG tablet Take 1 tablet Daily for BP Patient taking differently: Take 100 mg by mouth daily.  07/01/19  Yes Unk Pinto, MD  Magnesium 500 MG TABS Take 0.5 tablets (250 mg total) by mouth 3 (three) times daily. Patient taking differently: Take 500 mg by mouth daily.  05/21/18  Yes Liane Comber, NP  meclizine (ANTIVERT) 25 MG tablet Take 25 mg by mouth 3 (three) times daily as needed for dizziness.   Yes [provider]  meloxicam (MOBIC) 15 MG tablet Take 1/2 to 1 tablet Daily with food for Pain & Inflammation - limit to 4 -5 days /week to Avoid Kidney Damage Patient taking differently: Take 7.5-15 mg by mouth daily. Take 1/2 to 1 tablet Daily with food for Pain & Inflammation - limit to 4 -5 days /week to Avoid Kidney Damage 09/01/19  Yes Unk Pinto, MD  methocarbamol (ROBAXIN) 500 MG tablet Take 1 or 2 tablets 4 x /day for Muscle Spasm Patient taking differently: Take 500-1,000 mg by mouth 4 (four) times daily.  09/11/19  Yes Unk Pinto, MD  ondansetron (ZOFRAN) 8 MG tablet Take 1/2 to 1 tablet 3 x day if needed for Nausea Patient taking differently: Take 4-8 mg by mouth 3 (three) times daily as needed for nausea.  02/08/19  Yes Unk Pinto, MD  sitaGLIPtin-metformin (JANUMET) 50-500 MG tablet Take 1 tablet 2 x /day with a meal for Diabetes Patient taking differently: Take 1 tablet by mouth 2 (two) times daily with a meal. Take 1 tablet 2 x /day with a meal for Diabetes 01/05/19  Yes Unk Pinto, MD  tamsulosin (FLOMAX) 0.4 MG CAPS capsule Take 1 tablet Daily for Prostate Patient taking differently: Take 0.4 mg by mouth daily.  06/15/19  Yes Unk Pinto, MD  TURMERIC PO Take 1 capsule by mouth daily.   Yes [provider]    vitamin C (ASCORBIC ACID) 500 MG tablet Take 500 mg by mouth 2 (two) times daily.   Yes [provider]  zinc gluconate 50 MG tablet Take 50 mg by mouth daily.   Yes [provider]  B-D 3CC LUER-LOK SYR 21GX1" 21G X 1" 3 ML MISC use as directed 10/17/16   Unk Pinto, MD  Blood Glucose Monitoring Suppl (ONETOUCH VERIO) w/Device KIT Test blood sugar once daily. 03/03/19   Liane Comber, NP  Blood Glucose Monitoring Suppl DEVI Use meter three times a day to check blood sugar 05/20/18   Liane Comber, NP  glucose blood (ONETOUCH VERIO) test strip Test blood sugar once daily 03/03/19   Liane Comber, NP  glucose blood test strip Test blood sugar three times a day 05/20/18   Liane Comber, NP  Lancets [facility] ULTRASOFT) lancets Test blood sugar once daily 03/03/19   Liane Comber, NP  tiZANidine (ZANAFLEX) 4 MG tablet Take 1 tablet (4 mg total) by mouth every 8 (eight) hours as needed for muscle spasms. May cause drowsiness, do not take while driving or with alcohol. Patient not taking: Reported on 09/20/2019 09/08/19 09/07/20  Liane Comber, NP    Physical Exam: Vitals:   09/20/19 1239 09/20/19 1815  BP: 136/78 (!) 145/74  Pulse: 75 78  Resp: 16 16  Temp: 97.9 F (36.6 C) 98.4 F (36.9 C)  TempSrc: Oral Oral  SpO2: 97% 97%  Weight:  99.6 kg  Height:  5' 8" (1.727 m)    Constitutional: NAD, calm, comfortable Vitals:   09/20/19 1239 09/20/19 1815  BP: 136/78 (!) 145/74  Pulse: 75 78  Resp: 16 16  Temp: 97.9 F (36.6 C) 98.4 F (36.9 C)  TempSrc: Oral Oral  SpO2: 97% 97%  Weight:  99.6 kg  Height:  5' 8" (1.727 m)   Eyes: PERRL, lids and conjunctivae normal ENMT: Mucous membranes are moist. Posterior pharynx clear of any exudate or lesions.Normal dentition.  Neck: normal, supple, no masses, no thyromegaly Respiratory: clear to auscultation bilaterally, no wheezing, no crackles. Normal respiratory effort. No accessory muscle use.  Cardiovascular:  Regular rate and rhythm, no murmurs / rubs / gallops. No extremity edema. 2+ pedal pulses. No carotid bruits.  Abdomen: no tenderness, no masses palpated. No hepatosplenomegaly. Bowel sounds positive.  Musculoskeletal: no clubbing / cyanosis. No joint deformity upper and lower extremities. Good ROM, no contractures. Normal muscle tone.  Skin: no rashes, lesions, ulcers. No induration Neurologic: CN 2-12 grossly intact. Sensation intact, DTR normal. Strength 5/5 in all 4.  Psychiatric: Normal judgment and insight. Alert and oriented x 3. Normal mood.     Labs on Admission: I have personally reviewed following labs and imaging studies  CBC: Recent Labs  Lab 09/18/19 1154 09/19/19 1057 09/19/19 1327 09/20/19 1252  WBC 11.6* 10.2  --  11.8*  NEUTROABS 8,526*  --   --   --   HGB 15.0 15.2 14.6 15.3  HCT 44.3 44.0 43.0 44.3  MCV 81.4 81.0  --  80.3  PLT 216 195  --  440   Basic Metabolic Panel: Recent Labs  Lab 09/18/19 1154 09/19/19 1057 09/19/19 1327 09/20/19 1252  NA 136 134* 134* 130*  K 4.9 4.6 4.3 4.2  CL 97* 98  --  97*  CO2 28 22  --  22  GLUCOSE 274* 413*  --  256*  BUN 57* 59*  --  50*  CREATININE 2.04* 2.17*  --  2.09*  CALCIUM 9.4 9.1  --  8.7*   GFR: Estimated Creatinine Clearance: 36.6 mL/min (A) (by C-G formula based on SCr of 2.09 mg/dL (H)). Liver Function Tests: Recent Labs  Lab 09/18/19 1154 09/19/19 1057  AST 13 19  ALT 22 31  ALKPHOS  --  77  BILITOT 0.7 1.2  PROT 6.3 6.2*  ALBUMIN  --  3.3*   No results for input(s): LIPASE, AMYLASE in the last 168 hours. No results for input(s): AMMONIA in the last  168 hours. Coagulation Profile: No results for input(s): INR, PROTIME in the last 168 hours. Cardiac Enzymes: No results for input(s): CKTOTAL, CKMB, CKMBINDEX, TROPONINI in the last 168 hours. BNP (last 3 results) No results for input(s): PROBNP in the last 8760 hours. HbA1C: No results for input(s): HGBA1C in the last 72  hours. CBG: Recent Labs  Lab 09/19/19 1505 09/20/19 1725  GLUCAP 203* 290*   Lipid Profile: No results for input(s): CHOL, HDL, LDLCALC, TRIG, CHOLHDL, LDLDIRECT in the last 72 hours. Thyroid Function Tests: Recent Labs    09/18/19 1154  TSH 1.79   Anemia Panel: No results for input(s): VITAMINB12, FOLATE, FERRITIN, TIBC, IRON, RETICCTPCT in the last 72 hours. Urine analysis:    Component Value Date/Time   COLORURINE YELLOW 09/19/2019 1508   APPEARANCEUR CLEAR 09/19/2019 1508   LABSPEC 1.017 09/19/2019 1508   PHURINE 5.0 09/19/2019 1508   GLUCOSEU >=500 (A) 09/19/2019 1508   HGBUR NEGATIVE 09/19/2019 1508   BILIRUBINUR NEGATIVE 09/19/2019 1508   KETONESUR NEGATIVE 09/19/2019 1508   PROTEINUR NEGATIVE 09/19/2019 1508   NITRITE NEGATIVE 09/19/2019 1508   LEUKOCYTESUR NEGATIVE 09/19/2019 1508    Radiological Exams on Admission: NM PULMONARY VENT AND PERF (V/Q Scan)  Result Date: 09/19/2019 CLINICAL DATA:  Suspected pulmonary embolism. COVID-19 diagnosis in July 2020. Early renal failure. EXAM: NUCLEAR MEDICINE VENTILATION - PERFUSION LUNG SCAN TECHNIQUE: Ventilation images were obtained in multiple projections using inhaled aerosol [id number]. Perfusion images were obtained in multiple projections after intravenous injection of Tc-953mAA. RADIOPHARMACEUTICALS:  39.7 mCi of Tc-9932mPA aerosol inhalation and 1.55 mCi Tc99m59m IV COMPARISON:  None.  Chest radiography performed on the same date. FINDINGS: Ventilation: A couple peripheral subsegmental defects in the left upper lobe and adjacent the left major fissure with corresponding perfusion defects, but no corresponding radiographic abnormality. Perfusion: Two subsegmental wedge-shaped peripheral perfusion defects in the left upper lobe without corresponding abnormality on ventilation scintigraphy or radiography. Matching peripheral subsegmental defects in the left upper lobe and adjacent the left major fissure with  corresponding ventilation defects, as described above. IMPRESSION: Low probability for pulmonary embolism according to the modified PIOPED II criteria. Electronically Signed   By: RachRevonda Humphreyn: 09/19/2019 18:04   DG Chest Port 1 View  Result Date: 09/19/2019 CLINICAL DATA:  Shortness of breath. EXAM: PORTABLE CHEST 1 VIEW COMPARISON:  04/14/2019 FINDINGS: Cardiomediastinal contours are stable. Signs of aortic atherosclerosis. Hilar structures are unremarkable. Lungs are clear. Visualized skeletal structures are unremarkable. IMPRESSION: No acute cardiopulmonary disease. Electronically Signed   By: GeofZetta Bills.   On: 09/19/2019 13:12   CT Renal Stone Study  Result Date: 09/19/2019 CLINICAL DATA:  Right flank pain EXAM: CT ABDOMEN AND PELVIS WITHOUT CONTRAST TECHNIQUE: Multidetector CT imaging of the abdomen and pelvis was performed following the standard protocol without IV contrast. COMPARISON:  07/16/2016 FINDINGS: Lower chest: Lung bases are clear. Hepatobiliary: Moderate hepatic steatosis. Gallbladder is unremarkable. No intrahepatic or extrahepatic ductal dilatation. Pancreas: Within normal limits. Spleen: The normal limits. Adrenals/Urinary Tract: Adrenal glands are within normal limits. Left kidney is within normal limits. 2 mm nonobstructing posterior right upper pole renal calculus (series 3/image 44). 6 mm cyst in the anterior right lower kidney (series 3/image 52). Mild right hydroureteronephrosis. Associated 4 mm proximal right ureteral calculus at the L3-4 level (coronal image 61). Bladder is within normal limits. Stomach/Bowel: Stomach is within normal limits. No evidence of bowel obstruction. Normal appendix (series 3/image 66). Vascular/Lymphatic: No evidence of  abdominal aortic aneurysm. Atherosclerotic calcifications of the abdominal aorta and branch vessels. No suspicious abdominopelvic lymphadenopathy. Reproductive: Prostate is unremarkable. Other: No abdominopelvic ascites.  Musculoskeletal: Degenerative changes of the visualized thoracolumbar spine. IMPRESSION: 4 mm proximal right ureteral calculus at the L3-4 level. Mild right hydroureteronephrosis. Additional 2 mm nonobstructing right upper pole renal calculus. Additional ancillary findings as above. Electronically Signed   By: Julian Hy M.D.   On: 09/19/2019 14:58    EKG: Independently reviewed. NSR  Assessment/Plan Active Problems:   AKI (acute kidney injury) ([place])  AKI on CKD stage II Element of post-renal from obstructive uropathy with right sided obstructing ureter stone and hydronephrosis. Urology reviewed CT and says will do uroscope and stenting as outpt on Monday the earliest. IVF 125 ml/HR Continue Flomax Strain urine, as size of the stone rather small and may pass  HTN Hold HCTZ and ACEI PRN hydralazine  IIDM poorly controlled Last A1C 9.4 Sliding scale for now D/C metformin Refer to outpt Endo  Pseudohyponatremia From Hyperglycemia    DVT prophylaxis: Heparin subQ Code Status: Full Code Family Communication: None Disposition Plan: Expect discharge in AM if Cre level improves or remains stable Consults called: Urology Admission status: Tele Obs   Lequita Halt MD [facility] Pager [phone]    09/20/2019, 7:03 PM

## 2019-09-21 DIAGNOSIS — N182 Chronic kidney disease, stage 2 (mild): Secondary | ICD-10-CM | POA: Diagnosis present

## 2019-09-21 DIAGNOSIS — N179 Acute kidney failure, unspecified: Principal | ICD-10-CM

## 2019-09-21 DIAGNOSIS — N200 Calculus of kidney: Secondary | ICD-10-CM

## 2019-09-21 DIAGNOSIS — Z8616 Personal history of covid-19: Secondary | ICD-10-CM | POA: Diagnosis not present

## 2019-09-21 DIAGNOSIS — Z79899 Other long term (current) drug therapy: Secondary | ICD-10-CM | POA: Diagnosis not present

## 2019-09-21 DIAGNOSIS — Z7984 Long term (current) use of oral hypoglycemic drugs: Secondary | ICD-10-CM | POA: Diagnosis not present

## 2019-09-21 DIAGNOSIS — Z6835 Body mass index (BMI) 35.0-35.9, adult: Secondary | ICD-10-CM | POA: Diagnosis not present

## 2019-09-21 DIAGNOSIS — K760 Fatty (change of) liver, not elsewhere classified: Secondary | ICD-10-CM | POA: Diagnosis present

## 2019-09-21 DIAGNOSIS — G4733 Obstructive sleep apnea (adult) (pediatric): Secondary | ICD-10-CM | POA: Diagnosis present

## 2019-09-21 DIAGNOSIS — E669 Obesity, unspecified: Secondary | ICD-10-CM | POA: Diagnosis present

## 2019-09-21 DIAGNOSIS — Z8349 Family history of other endocrine, nutritional and metabolic diseases: Secondary | ICD-10-CM | POA: Diagnosis not present

## 2019-09-21 DIAGNOSIS — K219 Gastro-esophageal reflux disease without esophagitis: Secondary | ICD-10-CM | POA: Diagnosis present

## 2019-09-21 DIAGNOSIS — E1122 Type 2 diabetes mellitus with diabetic chronic kidney disease: Secondary | ICD-10-CM | POA: Diagnosis present

## 2019-09-21 DIAGNOSIS — Z7982 Long term (current) use of aspirin: Secondary | ICD-10-CM | POA: Diagnosis not present

## 2019-09-21 DIAGNOSIS — N132 Hydronephrosis with renal and ureteral calculous obstruction: Secondary | ICD-10-CM | POA: Diagnosis present

## 2019-09-21 DIAGNOSIS — I129 Hypertensive chronic kidney disease with stage 1 through stage 4 chronic kidney disease, or unspecified chronic kidney disease: Secondary | ICD-10-CM | POA: Diagnosis present

## 2019-09-21 DIAGNOSIS — Z888 Allergy status to other drugs, medicaments and biological substances status: Secondary | ICD-10-CM | POA: Diagnosis not present

## 2019-09-21 DIAGNOSIS — Z886 Allergy status to analgesic agent status: Secondary | ICD-10-CM | POA: Diagnosis not present

## 2019-09-21 DIAGNOSIS — Z791 Long term (current) use of non-steroidal anti-inflammatories (NSAID): Secondary | ICD-10-CM | POA: Diagnosis not present

## 2019-09-21 DIAGNOSIS — E1165 Type 2 diabetes mellitus with hyperglycemia: Secondary | ICD-10-CM | POA: Diagnosis present

## 2019-09-21 DIAGNOSIS — N400 Enlarged prostate without lower urinary tract symptoms: Secondary | ICD-10-CM | POA: Diagnosis present

## 2019-09-21 LAB — BASIC METABOLIC PANEL
Anion gap: 10 (ref 5–15)
Anion gap: 10 (ref 5–15)
BUN: 50 mg/dL — ABNORMAL HIGH (ref 8–23)
BUN: 46 mg/dL — ABNORMAL HIGH (ref 8–23)
CO2: 24 mmol/L (ref 22–32)
CO2: 22 mmol/L (ref 22–32)
Calcium: 8.6 mg/dL — ABNORMAL LOW (ref 8.9–10.3)
Calcium: 8.3 mg/dL — ABNORMAL LOW (ref 8.9–10.3)
Chloride: 99 mmol/L (ref 98–111)
Chloride: 102 mmol/L (ref 98–111)
Creatinine, Ser: 2 mg/dL — ABNORMAL HIGH (ref 0.61–1.24)
Creatinine, Ser: 2.25 mg/dL — ABNORMAL HIGH (ref 0.61–1.24)
GFR calc Af Amer: 38 mL/min — ABNORMAL LOW (ref >60)
GFR calc Af Amer: 33 mL/min — ABNORMAL LOW (ref >60)
GFR calc non Af Amer: 32 mL/min — ABNORMAL LOW (ref >60)
GFR calc non Af Amer: 28 mL/min — ABNORMAL LOW (ref >60)
Glucose, Bld: 213 mg/dL — ABNORMAL HIGH (ref 70–99)
Glucose, Bld: 201 mg/dL — ABNORMAL HIGH (ref 70–99)
Potassium: 4.8 mmol/L (ref 3.5–5.1)
Potassium: 4.3 mmol/L (ref 3.5–5.1)
Sodium: 133 mmol/L — ABNORMAL LOW (ref 135–145)
Sodium: 134 mmol/L — ABNORMAL LOW (ref 135–145)

## 2019-09-21 LAB — URINALYSIS, ROUTINE W REFLEX MICROSCOPIC
Bacteria, UA: NONE SEEN
Bilirubin Urine: NEGATIVE
Glucose, UA: >=500 mg/dL — AB
Ketones, ur: NEGATIVE mg/dL
Leukocytes,Ua: NEGATIVE
Nitrite: NEGATIVE
Protein, ur: NEGATIVE mg/dL
Specific Gravity, Urine: 1.015 (ref 1.005–1.030)
pH: 5 (ref 5.0–8.0)

## 2019-09-21 LAB — NA AND K (SODIUM & POTASSIUM), RAND UR
Potassium Urine: 41 mmol/L
Sodium, Ur: 86 mmol/L

## 2019-09-21 LAB — GLUCOSE, CAPILLARY
Glucose-Capillary: 216 mg/dL — ABNORMAL HIGH (ref 70–99)
Glucose-Capillary: 286 mg/dL — ABNORMAL HIGH (ref 70–99)
Glucose-Capillary: 195 mg/dL — ABNORMAL HIGH (ref 70–99)
Glucose-Capillary: 223 mg/dL — ABNORMAL HIGH (ref 70–99)

## 2019-09-21 LAB — CREATININE, URINE, RANDOM: Creatinine, Urine: 83.48 mg/dL

## 2019-09-21 MED ORDER — AMLODIPINE BESYLATE 5 MG PO TABS
5.0000 mg | ORAL_TABLET | Freq: Every day | ORAL | Status: DC
  Administered 2019-09-22: 5 mg via ORAL
  Filled 2019-09-21: qty 1

## 2019-09-21 MED ORDER — SODIUM CHLORIDE 0.9 % IV SOLN: INTRAVENOUS | Status: DC

## 2019-09-21 NOTE — Progress Notes (Signed)
Progress Note    Phillip Powell  [id number] DOB: August 15, 1945  DOA: 09/20/2019 PCP: Unk Pinto, MD    Brief Narrative:     Medical records reviewed and are as summarized below:  Phillip Powell is an 74 y.o. male  with medical history significant of was sent by his PCP second time since yesterday for AKI. Baseline Cre 1.1, yesterday 2.0 and today 2.1. He started to have right flank pain about one month ago. He was seen by PCP and treated with muscle relaxant for about 2 weeks which helped. But the right flank pain came back one week ago. And he went to see his PCP yesterday when is was found his creatinine jump to 2.04. He was sent to the emergency room yesterday where his creatinine was noted to be 2.17. On imaging studies he was noted to have a 4 mm obstructing proximal right ureteral stone with mild hydro.  Assessment/Plan:   Active Problems:   AKI (acute kidney injury) ([place])   AKI on CKD stage II On CT renal was found to have obstructive uropathy with right sided obstructing ureter stone and hydronephrosis.  -FENA was 1.6 which points to an intrinsic cause of his acute kidney injury Per admitting MD urology reviewed CT and says will do uroscope and stenting as outpt on Monday the earliest. Continue IV fluids Continue Flomax Strain urine, as size of the stone rather small and may pass -Recheck BMP this afternoon -As needed bladder scans -Order strict I's and O's  HTN Hold HCTZ and ACEI-due to acute kidney injury PRN hydralazine  Type 2 diabetes Last A1C 9.4 Sliding scale for now D/C metformin -encourage dietary compliance  Pseudohyponatremia From Hyperglycemia  obesity Body mass index is 33.39 kg/m.   Family Communication/Anticipated D/C date and plan/Code Status   DVT prophylaxis: Heparin Code Status: Full Code.  Family Communication: Spoke with wife Disposition Plan: Needs continued monitoring of his creatinine for  improvement   Medical Consultants:    None.     Subjective:   Denies pain  Objective:    Vitals:   09/20/19 1239 09/20/19 1815 09/20/19 1953 09/21/19 0336  BP: 136/78 (!) 145/74 132/84 (!) 143/78  Pulse: 75 78 75 81  Resp: 16 16 18 19   Temp: 97.9 F (36.6 C) 98.4 F (36.9 C) 97.7 F (36.5 C) 98 F (36.7 C)  TempSrc: Oral Oral Oral Oral  SpO2: 97% 97% 98% 94%  Weight:  99.6 kg    Height:  5\' 8"  (1.727 m)      Intake/Output Summary (Last 24 hours) at 09/21/2019 1332 Last data filed at 09/21/2019 1300 Gross per 24 hour  Intake 2107.05 ml  Output 950 ml  Net 1157.05 ml   Filed Weights   09/20/19 1815  Weight: 99.6 kg    Exam: Sitting on side of bed Regular rate and rhythm No increased work of breathing Tenderness on the right lower flank No lower extremity edema  Data Reviewed:   I have personally reviewed following labs and imaging studies:  Labs: Labs show the following:   Basic Metabolic Panel: Recent Labs  Lab 09/18/19 1154 09/18/19 1154 09/19/19 1057 09/19/19 1057 09/19/19 1327 09/19/19 1327 09/20/19 1252 09/21/19 0550  NA 136  --  134*  --  134*  --  130* 133*  K 4.9   < > 4.6   < > 4.3   < > 4.2 4.8  CL 97*  --  98  --   --   --  97* 99  CO2 28  --  22  --   --   --  22 24  GLUCOSE 274*  --  413*  --   --   --  256* 213*  BUN 57*  --  59*  --   --   --  50* 50*  CREATININE 2.04*  --  2.17*  --   --   --  2.09* 2.00*  CALCIUM 9.4  --  9.1  --   --   --  8.7* 8.6*   < > = values in this interval not displayed.   GFR Estimated Creatinine Clearance: 38.2 mL/min (A) (by C-G formula based on SCr of 2 mg/dL (H)). Liver Function Tests: Recent Labs  Lab 09/18/19 1154 09/19/19 1057  AST 13 19  ALT 22 31  ALKPHOS  --  77  BILITOT 0.7 1.2  PROT 6.3 6.2*  ALBUMIN  --  3.3*   No results for input(s): LIPASE, AMYLASE in the last 168 hours. No results for input(s): AMMONIA in the last 168 hours. Coagulation profile No results for  input(s): INR, PROTIME in the last 168 hours.  CBC: Recent Labs  Lab 09/18/19 1154 09/19/19 1057 09/19/19 1327 09/20/19 1252  WBC 11.6* 10.2  --  11.8*  NEUTROABS 8,526*  --   --   --   HGB 15.0 15.2 14.6 15.3  HCT 44.3 44.0 43.0 44.3  MCV 81.4 81.0  --  80.3  PLT 216 195  --  167   Cardiac Enzymes: No results for input(s): CKTOTAL, CKMB, CKMBINDEX, TROPONINI in the last 168 hours. BNP (last 3 results) No results for input(s): PROBNP in the last 8760 hours. CBG: Recent Labs  Lab 09/19/19 1505 09/20/19 1725 09/20/19 2115 09/21/19 0638 09/21/19 1137  GLUCAP 203* 290* 436* 216* 286*   D-Dimer: Recent Labs    09/19/19 1256  DDIMER 3.09*   Hgb A1c: No results for input(s): HGBA1C in the last 72 hours. Lipid Profile: No results for input(s): CHOL, HDL, LDLCALC, TRIG, CHOLHDL, LDLDIRECT in the last 72 hours. Thyroid function studies: No results for input(s): TSH, T4TOTAL, T3FREE, THYROIDAB in the last 72 hours.  Invalid input(s): FREET3 Anemia work up: No results for input(s): VITAMINB12, FOLATE, FERRITIN, TIBC, IRON, RETICCTPCT in the last 72 hours. Sepsis Labs: Recent Labs  Lab 09/18/19 1154 09/19/19 1057 09/20/19 1252  WBC 11.6* 10.2 11.8*    Microbiology Recent Results (from the past 240 hour(s))  Urine Culture     Status: None   Collection Time: 09/18/19 11:54 AM   Specimen: Urine  Result Value Ref Range Status   MICRO NUMBER: [id number]  Final   SPECIMEN QUALITY: Adequate  Final   Sample Source URINE  Final   STATUS: FINAL  Final   Result: No Growth  Final  Respiratory Panel by RT PCR (Flu A&B, Covid) - Nasopharyngeal Swab     Status: None   Collection Time: 09/19/19  2:53 PM   Specimen: Nasopharyngeal Swab  Result Value Ref Range Status   SARS Coronavirus 2 by RT PCR NEGATIVE NEGATIVE Final    Comment: (NOTE) SARS-CoV-2 target nucleic acids are NOT DETECTED. The SARS-CoV-2 RNA is generally detectable in upper respiratoy specimens during the  acute phase of infection. The lowest concentration of SARS-CoV-2 viral copies this assay can detect is 131 copies/mL. A negative result does not preclude SARS-Cov-2 infection and should not be used as the sole basis for treatment or other patient management decisions. A negative  result may occur with  improper specimen collection/handling, submission of specimen other than nasopharyngeal swab, presence of viral mutation(s) within the areas targeted by this assay, and inadequate number of viral copies (<131 copies/mL). A negative result must be combined with clinical observations, patient history, and epidemiological information. The expected result is Negative. Fact Sheet for Patients:  PinkCheek.be Fact Sheet for Healthcare Providers:  GravelBags.it This test is not yet ap proved or cleared by the [place] FDA and  has been authorized for detection and/or diagnosis of SARS-CoV-2 by FDA under an Emergency Use Authorization (EUA). This EUA will remain  in effect (meaning this test can be used) for the duration of the COVID-19 declaration under Section 564(b)(1) of the Act, 21 U.S.C. section 360bbb-3(b)(1), unless the authorization is terminated or revoked sooner.    Influenza A by PCR NEGATIVE NEGATIVE Final   Influenza B by PCR NEGATIVE NEGATIVE Final    Comment: (NOTE) The Xpert Xpress SARS-CoV-2/FLU/RSV assay is intended as an aid in  the diagnosis of influenza from Nasopharyngeal swab specimens and  should not be used as a sole basis for treatment. Nasal washings and  aspirates are unacceptable for Xpert Xpress SARS-CoV-2/FLU/RSV  testing. Fact Sheet for Patients: PinkCheek.be Fact Sheet for Healthcare Providers: GravelBags.it This test is not yet approved or cleared by the [place] FDA and  has been authorized for detection and/or diagnosis of SARS-CoV-2  by  FDA under an Emergency Use Authorization (EUA). This EUA will remain  in effect (meaning this test can be used) for the duration of the  Covid-19 declaration under Section 564(b)(1) of the Act, 21  U.S.C. section 360bbb-3(b)(1), unless the authorization is  terminated or revoked. Performed at [facility], [place] [address] [place], [place] [address]     Procedures and diagnostic studies:  NM PULMONARY VENT AND PERF (V/Q Scan)  Result Date: 09/19/2019 CLINICAL DATA:  Suspected pulmonary embolism. COVID-19 diagnosis in July 2020. Early renal failure. EXAM: NUCLEAR MEDICINE VENTILATION - PERFUSION LUNG SCAN TECHNIQUE: Ventilation images were obtained in multiple projections using inhaled aerosol Tc-76m DTPA. Perfusion images were obtained in multiple projections after intravenous injection of Tc-53m MAA. RADIOPHARMACEUTICALS:  39.7 mCi of Tc-99m DTPA aerosol inhalation and 1.55 mCi Tc63m MAA IV COMPARISON:  None.  Chest radiography performed on the same date. FINDINGS: Ventilation: A couple peripheral subsegmental defects in the left upper lobe and adjacent the left major fissure with corresponding perfusion defects, but no corresponding radiographic abnormality. Perfusion: Two subsegmental wedge-shaped peripheral perfusion defects in the left upper lobe without corresponding abnormality on ventilation scintigraphy or radiography. Matching peripheral subsegmental defects in the left upper lobe and adjacent the left major fissure with corresponding ventilation defects, as described above. IMPRESSION: Low probability for pulmonary embolism according to the modified PIOPED II criteria. Electronically Signed   By: Revonda Humphrey   On: 09/19/2019 18:04   CT Renal Stone Study  Result Date: 09/19/2019 CLINICAL DATA:  Right flank pain EXAM: CT ABDOMEN AND PELVIS WITHOUT CONTRAST TECHNIQUE: Multidetector CT imaging of the abdomen and pelvis was performed following the standard protocol without IV  contrast. COMPARISON:  07/16/2016 FINDINGS: Lower chest: Lung bases are clear. Hepatobiliary: Moderate hepatic steatosis. Gallbladder is unremarkable. No intrahepatic or extrahepatic ductal dilatation. Pancreas: Within normal limits. Spleen: The normal limits. Adrenals/Urinary Tract: Adrenal glands are within normal limits. Left kidney is within normal limits. 2 mm nonobstructing posterior right upper pole renal calculus (series 3/image 44). 6 mm cyst in the anterior right lower  kidney (series 3/image 52). Mild right hydroureteronephrosis. Associated 4 mm proximal right ureteral calculus at the L3-4 level (coronal image 61). Bladder is within normal limits. Stomach/Bowel: Stomach is within normal limits. No evidence of bowel obstruction. Normal appendix (series 3/image 66). Vascular/Lymphatic: No evidence of abdominal aortic aneurysm. Atherosclerotic calcifications of the abdominal aorta and branch vessels. No suspicious abdominopelvic lymphadenopathy. Reproductive: Prostate is unremarkable. Other: No abdominopelvic ascites. Musculoskeletal: Degenerative changes of the visualized thoracolumbar spine. IMPRESSION: 4 mm proximal right ureteral calculus at the L3-4 level. Mild right hydroureteronephrosis. Additional 2 mm nonobstructing right upper pole renal calculus. Additional ancillary findings as above. Electronically Signed   By: Julian Hy M.D.   On: 09/19/2019 14:58    Medications:   . [START ON 09/22/2019] amLODipine  5 mg Oral Daily  . vitamin C  500 mg Oral BID  . aspirin EC  81 mg Oral Daily  . atenolol  100 mg Oral Daily  . cholecalciferol  8,000 Units Oral Daily  . famotidine  40 mg Oral QHS  . heparin  5,000 Units Subcutaneous Q12H  . insulin aspart  0-9 Units Subcutaneous TID WC  . linagliptin  5 mg Oral Daily  . magnesium oxide  400 mg Oral Daily  . meloxicam  7.5 mg Oral Daily  . tamsulosin  0.4 mg Oral Daily   Continuous Infusions: . sodium chloride 125 mL/hr at 09/21/19 0141      LOS: 0 days   Geradine Girt  [facility]   How to contact the [facility] Attending or Consulting provider [place] or covering provider during after hours [place], for this patient?  1. Check the care team in [facility] and look for a) attending/consulting TRH provider listed and b) the [facility] team listed 2. Log into [email or web address] and use [place]'s universal password to access. If you do not have the password, please contact the hospital operator. 3. Locate the [facility] provider you are looking for under [facility] and page to a number that you can be directly reached. 4. If you still have difficulty reaching the provider, please page the [facility] (Director on Call) for the Hospitalists listed on amion for assistance.  09/21/2019, 1:32 PM

## 2019-09-21 NOTE — Plan of Care (Signed)
  Problem: Clinical Measurements: Goal: Ability to maintain clinical measurements within normal limits will improve Outcome: Progressing   Problem: Coping: Goal: Level of anxiety will decrease Outcome: Progressing   Problem: Elimination: Goal: Will not experience complications related to urinary retention Outcome: Progressing   Problem: Pain Managment: Goal: General experience of comfort will improve Outcome: Progressing   Problem: Safety: Goal: Ability to remain free from injury will improve Outcome: Progressing   

## 2019-09-22 ENCOUNTER — Encounter (HOSPITAL_COMMUNITY): Payer: Self-pay | Admitting: Internal Medicine

## 2019-09-22 ENCOUNTER — Inpatient Hospital Stay (HOSPITAL_COMMUNITY): Payer: Managed Care, Other (non HMO)

## 2019-09-22 DIAGNOSIS — N200 Calculus of kidney: Secondary | ICD-10-CM

## 2019-09-22 DIAGNOSIS — K760 Fatty (change of) liver, not elsewhere classified: Secondary | ICD-10-CM | POA: Diagnosis present

## 2019-09-22 DIAGNOSIS — N1330 Unspecified hydronephrosis: Secondary | ICD-10-CM | POA: Diagnosis present

## 2019-09-22 LAB — CBC
HCT: 36.8 % — ABNORMAL LOW (ref 39.0–52.0)
Hemoglobin: 12.3 g/dL — ABNORMAL LOW (ref 13.0–17.0)
MCH: 27.6 pg (ref 26.0–34.0)
MCHC: 33.4 g/dL (ref 30.0–36.0)
MCV: 82.7 fL (ref 80.0–100.0)
Platelets: 120 10*3/uL — ABNORMAL LOW (ref 150–400)
RBC: 4.45 MIL/uL (ref 4.22–5.81)
RDW: 13.1 % (ref 11.5–15.5)
WBC: 7.7 10*3/uL (ref 4.0–10.5)
nRBC: 0 % (ref 0.0–0.2)

## 2019-09-22 LAB — GLUCOSE, CAPILLARY
Glucose-Capillary: 228 mg/dL — ABNORMAL HIGH (ref 70–99)
Glucose-Capillary: 242 mg/dL — ABNORMAL HIGH (ref 70–99)
Glucose-Capillary: 187 mg/dL — ABNORMAL HIGH (ref 70–99)
Glucose-Capillary: 181 mg/dL — ABNORMAL HIGH (ref 70–99)

## 2019-09-22 LAB — BASIC METABOLIC PANEL
Anion gap: 11 (ref 5–15)
BUN: 42 mg/dL — ABNORMAL HIGH (ref 8–23)
CO2: 21 mmol/L — ABNORMAL LOW (ref 22–32)
Calcium: 8.1 mg/dL — ABNORMAL LOW (ref 8.9–10.3)
Chloride: 102 mmol/L (ref 98–111)
Creatinine, Ser: 2.07 mg/dL — ABNORMAL HIGH (ref 0.61–1.24)
GFR calc Af Amer: 36 mL/min — ABNORMAL LOW (ref >60)
GFR calc non Af Amer: 31 mL/min — ABNORMAL LOW (ref >60)
Glucose, Bld: 264 mg/dL — ABNORMAL HIGH (ref 70–99)
Potassium: 4.2 mmol/L (ref 3.5–5.1)
Sodium: 134 mmol/L — ABNORMAL LOW (ref 135–145)

## 2019-09-22 LAB — HEMOGLOBIN A1C
Hgb A1c MFr Bld: 10.4 % — ABNORMAL HIGH (ref 4.8–5.6)
Mean Plasma Glucose: 251.78 mg/dL

## 2019-09-22 MED ORDER — INSULIN ASPART 100 UNIT/ML ~~LOC~~ SOLN: 0.0000 [IU] | Freq: Every day | SUBCUTANEOUS | Status: DC

## 2019-09-22 MED ORDER — INSULIN STARTER KIT- PEN NEEDLES (ENGLISH)
1.0000 | Freq: Once | Status: AC
  Administered 2019-09-22: 1
  Filled 2019-09-22 (×2): qty 1

## 2019-09-22 MED ORDER — LIVING WELL WITH DIABETES BOOK
Freq: Once | Status: AC
  Filled 2019-09-22: qty 1

## 2019-09-22 MED ORDER — INSULIN ASPART 100 UNIT/ML ~~LOC~~ SOLN
5.0000 [IU] | Freq: Three times a day (TID) | SUBCUTANEOUS | Status: DC
  Administered 2019-09-22 – 2019-09-23 (×4): 5 [IU] via SUBCUTANEOUS

## 2019-09-22 MED ORDER — AMLODIPINE BESYLATE 5 MG PO TABS
5.0000 mg | ORAL_TABLET | Freq: Once | ORAL | Status: AC
  Administered 2019-09-22: 5 mg via ORAL
  Filled 2019-09-22: qty 1

## 2019-09-22 MED ORDER — INSULIN ASPART 100 UNIT/ML ~~LOC~~ SOLN
0.0000 [IU] | Freq: Three times a day (TID) | SUBCUTANEOUS | Status: DC
  Administered 2019-09-22: 17:00:00 3 [IU] via SUBCUTANEOUS
  Administered 2019-09-22 – 2019-09-23 (×3): 5 [IU] via SUBCUTANEOUS

## 2019-09-22 MED ORDER — AMLODIPINE BESYLATE 10 MG PO TABS
10.0000 mg | ORAL_TABLET | Freq: Every day | ORAL | Status: DC
  Administered 2019-09-23: 10 mg via ORAL
  Filled 2019-09-22: qty 1

## 2019-09-22 NOTE — Progress Notes (Signed)
Progress Note    Phillip Powell  [id number] DOB: 20-Apr-1946  DOA: 09/20/2019 PCP: Unk Pinto, MD    Brief Narrative:   Chief complaint: flank pain  Medical records reviewed and are as summarized below:  Phillip Powell is an 74 y.o. male with a past medical history significant for diabetes type 2, hypertension, GERD, obstructive sleep apnea, obesity presents to the emergency department chief complaint of right flank pain.  Patient work-up revealed acute kidney injury.  He was sent to the emergency department by his primary care provider.  Work-up included a be met revealing a creatinine of 2.17 and a CT scan revealing 4 mm proximal right ureteral calculi with mild right hydroureteronephrosis.  He was provided with IV fluids pain management draining of urine with little improvement. Renal US ordered today. Query a chronic component however creatinine 1.13 12/20.  Assessment/Plan:   Principal Problem:   AKI (acute kidney injury) ([place]) Active Problems:   Renal calculi   Hydroureteronephrosis   Hypertension   Type 2 diabetes mellitus (HCC)   GERD (gastroesophageal reflux disease)   OSA (obstructive sleep apnea)   Obesity (BMI 30.0-34.9)   Hepatic steatosis  #1.  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  CT as noted above. FENA 1.6 indicating intrinsic cause of AKI.  Chart review indicates urology consulted at time of admission who reviewed CT indicated uteroscope and stenting as outpatient on Monday the earliest.  There is little improvement in his creatinine.  Continues with intermittent pain.  No passage of the stone.  Has completed IV fluids and continues with Flomax.  Denies any dysuria hematuria. -Renal ultrasound, may need to call urology depending on results -Continue I's and O's -As needed bladder scan -recheck in am  #2.  Hypertension.  Home medications include hydrochlorothiazide and ACE inhibitor.  Fair control as long as his pain is  managed. -Continue to hold home medications -As needed hydralazine -Monitor blood pressure closely  3.  Diabetes type 2.  Uncontrolled.  Hemoglobin A1c 9.42 months ago.  Home medications include Metformin.  Appetite is good.  CBGs consistently above 200. -change SSI to mod and add HS coverage -continue tradjenta -monitor  #4. Pseudohyponatremia. Sodium level trending up. Likely related to hyperglycemia -see above  5.  Obesity.  BMI 33.  #6.  Obstructive sleep apnea.  Noncompliant with CPAP at home. -Respiratory therapy  #7.  Dental finding on abdominal CT of hepatic steatosis.  LFTs within the limits of normal -Outpatient follow-up    Family Communication/Anticipated D/C date and plan/Code Status   DVT prophylaxis: Lovenox ordered. Code Status: Full Code.  Family Communication: wife on phone Disposition Plan: home when ready   Medical Consultants:    None.   Anti-Infectives:    None  Subjective:   Awake alert watching TV.  Reports episode of severe right flank pain last night but states pain medicine helped.  Denies dysuria hematuria frequency or urgency  Objective:    Vitals:   09/21/19 1515 09/21/19 1926 09/22/19 0312 09/22/19 0758  BP: 126/87 (!) 147/76 125/72 (!) 166/92  Pulse: 75 75 66 68  Resp: '16 18 18 18  ' Temp: 98.3 F (36.8 C) 97.8 F (36.6 C) 98 F (36.7 C) 97.6 F (36.4 C)  TempSrc: Oral Oral Oral Oral  SpO2: 96% 96% 96% 97%  Weight:      Height:        Intake/Output Summary (Last 24 hours) at 09/22/2019 1034 Last data filed at  09/22/2019 0823 Gross per 24 hour  Intake 2598.89 ml  Output 1700 ml  Net 898.89 ml   Filed Weights   09/20/19 1815  Weight: 99.6 kg    Exam: General: Awake alert obese no acute distress CV: Regular rate and rhythm no murmur gallop or rub no lower extremity edema Respiratory: No increased work of breathing breath sounds clear bilaterally I hear no wheeze no rhonchi Abdomen: Obese soft positive bowel sounds  throughout nontender to palpation mild tenderness right flank to palpation no guarding or rebounding Musculoskeletal joints without swelling/erythema full range of motion   Data Reviewed:   I have personally reviewed following labs and imaging studies:  Labs: Labs show the following:   Basic Metabolic Panel: Recent Labs  Lab 09/19/19 1057 09/19/19 1057 09/19/19 1327 09/19/19 1327 09/20/19 1252 09/20/19 1252 09/21/19 0550 09/21/19 0550 09/21/19 1639 09/22/19 0159  NA 134*   < > 134*  --  130*  --  133*  --  134* 134*  K 4.6   < > 4.3   < > 4.2   < > 4.8   < > 4.3 4.2  CL 98  --   --   --  97*  --  99  --  102 102  CO2 22  --   --   --  22  --  24  --  22 21*  GLUCOSE 413*  --   --   --  256*  --  213*  --  201* 264*  BUN 59*  --   --   --  50*  --  50*  --  46* 42*  CREATININE 2.17*  --   --   --  2.09*  --  2.00*  --  2.25* 2.07*  CALCIUM 9.1  --   --   --  8.7*  --  8.6*  --  8.3* 8.1*   < > = values in this interval not displayed.   GFR Estimated Creatinine Clearance: 36.9 mL/min (A) (by C-G formula based on SCr of 2.07 mg/dL (H)). Liver Function Tests: Recent Labs  Lab 09/18/19 1154 09/19/19 1057  AST 13 19  ALT 22 31  ALKPHOS  --  77  BILITOT 0.7 1.2  PROT 6.3 6.2*  ALBUMIN  --  3.3*   No results for input(s): LIPASE, AMYLASE in the last 168 hours. No results for input(s): AMMONIA in the last 168 hours. Coagulation profile No results for input(s): INR, PROTIME in the last 168 hours.  CBC: Recent Labs  Lab 09/18/19 1154 09/19/19 1057 09/19/19 1327 09/20/19 1252 09/22/19 0159  WBC 11.6* 10.2  --  11.8* 7.7  NEUTROABS 8,526*  --   --   --   --   HGB 15.0 15.2 14.6 15.3 12.3*  HCT 44.3 44.0 43.0 44.3 36.8*  MCV 81.4 81.0  --  80.3 82.7  PLT 216 195  --  167 120*   Cardiac Enzymes: No results for input(s): CKTOTAL, CKMB, CKMBINDEX, TROPONINI in the last 168 hours. BNP (last 3 results) No results for input(s): PROBNP in the last 8760  hours. CBG: Recent Labs  Lab 09/21/19 0638 09/21/19 1137 09/21/19 1636 09/21/19 1927 09/22/19 0638  GLUCAP 216* 286* 195* 223* 228*   D-Dimer: Recent Labs    09/19/19 1256  DDIMER 3.09*   Hgb A1c: No results for input(s): HGBA1C in the last 72 hours. Lipid Profile: No results for input(s): CHOL, HDL, LDLCALC, TRIG, CHOLHDL, LDLDIRECT in the last  72 hours. Thyroid function studies: No results for input(s): TSH, T4TOTAL, T3FREE, THYROIDAB in the last 72 hours.  Invalid input(s): FREET3 Anemia work up: No results for input(s): VITAMINB12, FOLATE, FERRITIN, TIBC, IRON, RETICCTPCT in the last 72 hours. Sepsis Labs: Recent Labs  Lab 09/18/19 1154 09/19/19 1057 09/20/19 1252 09/22/19 0159  WBC 11.6* 10.2 11.8* 7.7    Microbiology Recent Results (from the past 240 hour(s))  Urine Culture     Status: None   Collection Time: 09/18/19 11:54 AM   Specimen: Urine  Result Value Ref Range Status   MICRO NUMBER: [id number]  Final   SPECIMEN QUALITY: Adequate  Final   Sample Source URINE  Final   STATUS: FINAL  Final   Result: No Growth  Final  Respiratory Panel by RT PCR (Flu A&B, Covid) - Nasopharyngeal Swab     Status: None   Collection Time: 09/19/19  2:53 PM   Specimen: Nasopharyngeal Swab  Result Value Ref Range Status   SARS Coronavirus 2 by RT PCR NEGATIVE NEGATIVE Final    Comment: (NOTE) SARS-CoV-2 target nucleic acids are NOT DETECTED. The SARS-CoV-2 RNA is generally detectable in upper respiratoy specimens during the acute phase of infection. The lowest concentration of SARS-CoV-2 viral copies this assay can detect is 131 copies/mL. A negative result does not preclude SARS-Cov-2 infection and should not be used as the sole basis for treatment or other patient management decisions. A negative result may occur with  improper specimen collection/handling, submission of specimen other than nasopharyngeal swab, presence of viral mutation(s) within the areas targeted  by this assay, and inadequate number of viral copies (<131 copies/mL). A negative result must be combined with clinical observations, patient history, and epidemiological information. The expected result is Negative. Fact Sheet for Patients:  PinkCheek.be Fact Sheet for Healthcare Providers:  GravelBags.it This test is not yet ap proved or cleared by the [place] FDA and  has been authorized for detection and/or diagnosis of SARS-CoV-2 by FDA under an Emergency Use Authorization (EUA). This EUA will remain  in effect (meaning this test can be used) for the duration of the COVID-19 declaration under Section 564(b)(1) of the Act, 21 U.S.C. section 360bbb-3(b)(1), unless the authorization is terminated or revoked sooner.    Influenza A by PCR NEGATIVE NEGATIVE Final   Influenza B by PCR NEGATIVE NEGATIVE Final    Comment: (NOTE) The Xpert Xpress SARS-CoV-2/FLU/RSV assay is intended as an aid in  the diagnosis of influenza from Nasopharyngeal swab specimens and  should not be used as a sole basis for treatment. Nasal washings and  aspirates are unacceptable for Xpert Xpress SARS-CoV-2/FLU/RSV  testing. Fact Sheet for Patients: PinkCheek.be Fact Sheet for Healthcare Providers: GravelBags.it This test is not yet approved or cleared by the [place] FDA and  has been authorized for detection and/or diagnosis of SARS-CoV-2 by  FDA under an Emergency Use Authorization (EUA). This EUA will remain  in effect (meaning this test can be used) for the duration of the  Covid-19 declaration under Section 564(b)(1) of the Act, 21  U.S.C. section 360bbb-3(b)(1), unless the authorization is  terminated or revoked. Performed at [facility], [place] [address], [place], [place] [address]     Procedures and diagnostic studies:  No results found.  Medications:   .  amLODipine  5 mg Oral Daily  . vitamin C  500 mg Oral BID  . aspirin EC  81 mg Oral Daily  . atenolol  100 mg Oral  Daily  . cholecalciferol  8,000 Units Oral Daily  . famotidine  40 mg Oral QHS  . heparin  5,000 Units Subcutaneous Q12H  . insulin aspart  0-9 Units Subcutaneous TID WC  . linagliptin  5 mg Oral Daily  . magnesium oxide  400 mg Oral Daily  . tamsulosin  0.4 mg Oral Daily   Continuous Infusions:   LOS: 1 day   Radene Gunning NP  [facility]   How to contact the [facility] Attending or Consulting provider [place] or covering provider during after hours [place], for this patient?  1. Check the care team in [facility] and look for a) attending/consulting TRH provider listed and b) the [facility] team listed 2. Log into [email or web address] and use [place]'s universal password to access. If you do not have the password, please contact the hospital operator. 3. Locate the [facility] provider you are looking for under [facility] and page to a number that you can be directly reached. 4. If you still have difficulty reaching the provider, please page the [facility] (Director on Call) for the Hospitalists listed on amion for assistance.  09/22/2019, 10:34 AM

## 2019-09-22 NOTE — Progress Notes (Addendum)
Inpatient Diabetes Program Recommendations  AACE/ADA: New Consensus Statement on Inpatient Glycemic Control (2015)  Target Ranges:  Prepandial:   less than 140 mg/dL      Peak postprandial:   less than 180 mg/dL (1-2 hours)      Critically ill patients:  140 - 180 mg/dL   Lab Results  Component Value Date   GLUCAP 242 (H) 09/22/2019   HGBA1C 10.4 (H) 09/22/2019    Review of Glycemic Control Results for Phillip Powell, Phillip Powell (MRN [id number]) as of 09/22/2019 14:15  Ref. Range 09/21/2019 11:37 09/21/2019 16:36 09/21/2019 19:27 09/22/2019 06:38 09/22/2019 11:34  Glucose-Capillary Latest Ref Range: 70 - 99 mg/dL 286 (H) 195 (H) 223 (H) 228 (H) 242 (H)   Diabetes history: DM2 Outpatient Diabetes medications: Glyburide 2.5-5 mg tid + Janumet 50-500 mg bid Current orders for Inpatient glycemic control: Novolog 5 units tid + Tradjenta 5 mg qd + Novolog moderate correction tid + hs 0-5 units  Inpatient Diabetes Program Recommendations:   Consider Levemir 8 units bid (0.15 units/kg x 99.6 kg =15 units) Decrease Novolog correction to sensitive tid + hs 0-5 units A1c 10.4 -Will plan to speak with patient. Sent secure chat to Dr. 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 Will follow and speak with patient.  Thank you, Nani Gasser. Lylian Sanagustin, RN, MSN, CDE  Diabetes Coordinator Inpatient Glycemic Control Team Team Pager [phone] (8am-5pm) 09/22/2019 2:25 PM

## 2019-09-22 NOTE — Plan of Care (Signed)
  Problem: Education: Goal: Knowledge of General Education information will improve Description: Including pain rating scale, medication(s)/side effects and non-pharmacologic comfort measures Outcome: Progressing   Problem: Clinical Measurements: Goal: Respiratory complications will improve Outcome: Progressing Note: On room air   Problem: Activity: Goal: Risk for activity intolerance will decrease Outcome: Progressing Note: Up independently in room, tolerating well   Problem: Nutrition: Goal: Adequate nutrition will be maintained Outcome: Progressing   Problem: Coping: Goal: Level of anxiety will decrease Outcome: Progressing   Problem: Elimination: Goal: Will not experience complications related to urinary retention Outcome: Progressing Note: Straining all urine, no signs of kidney stone passed   Problem: Pain Managment: Goal: General experience of comfort will improve Outcome: Progressing Note: No complaints of pain this shift   Problem: Safety: Goal: Ability to remain free from injury will improve Outcome: Progressing

## 2019-09-23 ENCOUNTER — Other Ambulatory Visit: Payer: Self-pay | Admitting: Internal Medicine

## 2019-09-23 DIAGNOSIS — N179 Acute kidney failure, unspecified: Secondary | ICD-10-CM

## 2019-09-23 DIAGNOSIS — N184 Chronic kidney disease, stage 4 (severe): Secondary | ICD-10-CM

## 2019-09-23 DIAGNOSIS — E1122 Type 2 diabetes mellitus with diabetic chronic kidney disease: Secondary | ICD-10-CM

## 2019-09-23 DIAGNOSIS — N189 Chronic kidney disease, unspecified: Secondary | ICD-10-CM

## 2019-09-23 LAB — GLUCOSE, CAPILLARY
Glucose-Capillary: 211 mg/dL — ABNORMAL HIGH (ref 70–99)
Glucose-Capillary: 229 mg/dL — ABNORMAL HIGH (ref 70–99)

## 2019-09-23 LAB — BASIC METABOLIC PANEL
Anion gap: 11 (ref 5–15)
BUN: 36 mg/dL — ABNORMAL HIGH (ref 8–23)
CO2: 21 mmol/L — ABNORMAL LOW (ref 22–32)
Calcium: 8.7 mg/dL — ABNORMAL LOW (ref 8.9–10.3)
Chloride: 100 mmol/L (ref 98–111)
Creatinine, Ser: 1.89 mg/dL — ABNORMAL HIGH (ref 0.61–1.24)
GFR calc Af Amer: 40 mL/min — ABNORMAL LOW (ref >60)
GFR calc non Af Amer: 35 mL/min — ABNORMAL LOW (ref >60)
Glucose, Bld: 243 mg/dL — ABNORMAL HIGH (ref 70–99)
Potassium: 4.4 mmol/L (ref 3.5–5.1)
Sodium: 132 mmol/L — ABNORMAL LOW (ref 135–145)

## 2019-09-23 MED ORDER — AMLODIPINE BESYLATE 10 MG PO TABS: 10.0000 mg | ORAL_TABLET | Freq: Every day | ORAL | 1 refills | Status: DC

## 2019-09-23 MED ORDER — LIVING WELL WITH DIABETES BOOK
Freq: Once | Status: AC
  Filled 2019-09-23: qty 1

## 2019-09-23 MED ORDER — MAGNESIUM OXIDE 400 (241.3 MG) MG PO TABS: 400.0000 mg | ORAL_TABLET | Freq: Every day | ORAL | 1 refills | Status: DC

## 2019-09-23 MED ORDER — OXYCODONE HCL 5 MG PO TABS: 5.0000 mg | ORAL_TABLET | Freq: Four times a day (QID) | ORAL | 0 refills | Status: DC | PRN

## 2019-09-23 MED ORDER — LINAGLIPTIN 5 MG PO TABS: 5.0000 mg | ORAL_TABLET | Freq: Every day | ORAL | 1 refills | Status: DC

## 2019-09-23 NOTE — Plan of Care (Signed)
  Problem: Education: Goal: Knowledge of General Education information will improve Description: Including pain rating scale, medication(s)/side effects and non-pharmacologic comfort measures Outcome: Progressing   Problem: Health Behavior/Discharge Planning: Goal: Ability to manage health-related needs will improve Outcome: Progressing   Problem: Clinical Measurements: Goal: Will remain free from infection Outcome: Progressing Goal: Cardiovascular complication will be avoided Outcome: Progressing   Problem: Elimination: Goal: Will not experience complications related to bowel motility Outcome: Progressing

## 2019-09-23 NOTE — Progress Notes (Signed)
Inpatient Diabetes Program Recommendations  AACE/ADA: New Consensus Statement on Inpatient Glycemic Control (2015)  Target Ranges:  Prepandial:   less than 140 mg/dL      Peak postprandial:   less than 180 mg/dL (1-2 hours)      Critically ill patients:  140 - 180 mg/dL   Lab Results  Component Value Date   GLUCAP 229 (H) 09/23/2019   HGBA1C 10.4 (H) 09/22/2019    Review of Glycemic Control Inpatient Diabetes Program Recommendations:   Spoke with pt and wife @ bedside about A1C results 10.4 (average blood glucose 252 over the past 2-3 months) with them and explained what an A1C is, basic pathophysiology of DM Type 2, basic home care, basic diabetes diet nutrition principles, importance of checking CBGs and maintaining good CBG control to prevent long-term and short-term complications. Reviewed signs and symptoms of hyperglycemia and hypoglycemia and how to treat hypoglycemia at home. Also reviewed blood sugar goals at home.  Have ordered educational booklet. Spent approximately 45 minutes discussing nutritional options for patient while working. Discussed risks of hyperglycemia and patient acknowledges understanding.  Thank you, Nani Gasser. Agusta Hackenberg, RN, MSN, CDE  Diabetes Coordinator Inpatient Glycemic Control Team Team Pager [phone] (8am-5pm) 09/23/2019 4:02 PM

## 2019-09-23 NOTE — Discharge Summary (Addendum)
Physician Discharge Summary  Phillip Powell KZS:[id number] DOB: 1945-10-13 DOA: 09/20/2019  PCP: Unk Pinto, MD  Admit date: 09/20/2019 Discharge date: 09/23/2019  Admitted From: home Discharge disposition: home   Recommendations for Outpatient Follow-Up:   Follow-up with primary care provider 1 to 2 weeks for evaluation of kidney function, diabetes control, blood pressure control.  Recommend basic metabolic panel to track creatinine  Patient has an appointment with urology March 18 at 10:30 AM for evaluation of kidney stone   Discharge Diagnosis:   Principal Problem:   AKI (acute kidney injury) ([place]) Active Problems:   Renal calculi   Hydroureteronephrosis   Hypertension   Type 2 diabetes mellitus ([place])   GERD (gastroesophageal reflux disease)   OSA (obstructive sleep apnea)   Obesity (BMI 30.0-34.9)   Hepatic steatosis    Discharge Condition: Improved.  Diet recommendation: Low sodium, heart healthy.  Carbohydrate-modified.    Wound care: None.  Code status: Full.   History of Present Illness:   Phillip Powell is a 74 y.o. male with medical history significant of was sent to ED 2/27 by his PCP second time since day prior for AKI. Baseline Cre 1.1, day prior 2.0 and 2/27 2.1. He started to have right flank pain about one month ago.  He was seen by PCP and treated with muscle relaxant for about 2 weeks which helped. But the right flank pain came back one week prior. And he went to see his PCP 2/26 when  found his creatinine jump to 2.04.  He was sent to the emergency room then where his creatinine was noted to be 2.17.  On imaging studies he was noted to have a 4 mm obstructing proximal right ureteral stone with mild hydro.  He was given 1 Liter on IV bolus ED and per notes in the patient, V/Q scan was negative. ED physician tried to persuade patient to stay but pt instead decided to go home. PCP called today and sent pt again to ER.  Patient denied any  difficulty urinating fevers.     Hospital Course by Problem:   #1.  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  CT REVEALED 4 MM PROXIMAL RIGHT URETERAL CALCULI WITH MILD RIGHT HYDROURETERONEPHROSIS.  FENA 1.6 indicating intrinsic cause of AKI.  Renal ultrasound reveals normal renal size, mild right hydronephrosis which is likely due to persistent obstruction due to right renal calculi as identified on recent CT.  Chart review indicates urology consulted at time of admission who reviewed CT indicated uteroscope and stenting as outpatient.Marland Kitchen He was provided with pain medicine and vigorous IV fluids and his urine was strained.  At discharge he had not passed stone but pain had improved.  At discharge his creatinine was 1.8 down from 2.2.  He has an appointment with urology March 18.   #2.  Hypertension.  Home medications included hydrochlorothiazide and ACE inhibitor and these were stopped.  Amlodipine increased with improved control. Continue home BB.  OP follow up with PCP to monitor BP control   3.  Diabetes type 2.  Uncontrolled.  Hemoglobin A1c 10.4.  Home medications include Metformin. Metformin stopped. 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 Will be discharged with glyburide, tradjenta. Recommend close OP follow up with PCP   #4. Pseudohyponatremia. Sodium level trending up. Likely related to hyperglycemia   5.  Obesity.  BMI 33.   #6.  Obstructive sleep apnea.  Noncompliant with CPAP at home.   #  7.  Dental finding on abdominal CT of hepatic steatosis.  LFTs within the limits of normal -Outpatient follow-up    Medical Consultants:      Discharge Exam:   Vitals:   09/23/19 0823 09/23/19 1125  BP: (!) 168/85 (!) 144/72  Pulse: 73 69  Resp: 19   Temp: 97.8 F (36.6 C)   SpO2: 93%    Vitals:   09/22/19 2358 09/23/19 0452 09/23/19 0823 09/23/19 1125  BP: 139/72 140/70 (!) 168/85 (!) 144/72  Pulse: 67 69 73 69  Resp: '16 16 19   ' Temp: 98.8 F (37.1 C)  98.1 F (36.7 C) 97.8 F (36.6 C)   TempSrc: Oral Oral Oral   SpO2: 95% 94% 93%   Weight:      Height:        General exam: Appears calm and comfortable. Sitting in chair no acute distress Respiratory system: Clear to auscultation. Respiratory effort normal. Cardiovascular system: S1 & S2 heard, RRR. No JVD,  rubs, gallops or clicks. No murmurs. Gastrointestinal system: Abdomen is nondistended, soft and nontender. Obese. No organomegaly or masses felt. Normal bowel sounds heard. Central nervous system: Alert and oriented. No focal neurological deficits. Extremities: No clubbing,  or cyanosis. No edema. Skin: No rashes, lesions or ulcers. Psychiatry: Judgement and insight appear normal. Mood & affect appropriate.    The results of significant diagnostics from this hospitalization (including imaging, microbiology, ancillary and laboratory) are listed below for reference.     Procedures and Diagnostic Studies:   No results found.   Labs:   Basic Metabolic Panel: Recent Labs  Lab 09/20/19 1252 09/20/19 1252 09/21/19 0550 09/21/19 0550 09/21/19 1639 09/21/19 1639 09/22/19 0159 09/23/19 0724  NA 130*  --  133*  --  134*  --  134* 132*  K 4.2   < > 4.8   < > 4.3   < > 4.2 4.4  CL 97*  --  99  --  102  --  102 100  CO2 22  --  24  --  22  --  21* 21*  GLUCOSE 256*  --  213*  --  201*  --  264* 243*  BUN 50*  --  50*  --  46*  --  42* 36*  CREATININE 2.09*  --  2.00*  --  2.25*  --  2.07* 1.89*  CALCIUM 8.7*  --  8.6*  --  8.3*  --  8.1* 8.7*   < > = values in this interval not displayed.   GFR Estimated Creatinine Clearance: 40.4 mL/min (A) (by C-G formula based on SCr of 1.89 mg/dL (H)). Liver Function Tests: Recent Labs  Lab 09/18/19 1154 09/19/19 1057  AST 13 19  ALT 22 31  ALKPHOS  --  77  BILITOT 0.7 1.2  PROT 6.3 6.2*  ALBUMIN  --  3.3*   No results for input(s): LIPASE, AMYLASE in the last 168 hours. No results for input(s): AMMONIA in the last 168  hours. Coagulation profile No results for input(s): INR, PROTIME in the last 168 hours.  CBC: Recent Labs  Lab 09/18/19 1154 09/19/19 1057 09/19/19 1327 09/20/19 1252 09/22/19 0159  WBC 11.6* 10.2  --  11.8* 7.7  NEUTROABS 8,526*  --   --   --   --   HGB 15.0 15.2 14.6 15.3 12.3*  HCT 44.3 44.0 43.0 44.3 36.8*  MCV 81.4 81.0  --  80.3 82.7  PLT 216 195  --  167  120*   Cardiac Enzymes: No results for input(s): CKTOTAL, CKMB, CKMBINDEX, TROPONINI in the last 168 hours. BNP: Invalid input(s): POCBNP CBG: Recent Labs  Lab 09/22/19 1134 09/22/19 1635 09/22/19 1930 09/23/19 0621 09/23/19 1115  GLUCAP 242* 187* 181* 211* 229*   D-Dimer No results for input(s): DDIMER in the last 72 hours. Hgb A1c Recent Labs    09/22/19 0159  HGBA1C 10.4*   Lipid Profile No results for input(s): CHOL, HDL, LDLCALC, TRIG, CHOLHDL, LDLDIRECT in the last 72 hours. Thyroid function studies No results for input(s): TSH, T4TOTAL, T3FREE, THYROIDAB in the last 72 hours.  Invalid input(s): FREET3 Anemia work up No results for input(s): VITAMINB12, FOLATE, FERRITIN, TIBC, IRON, RETICCTPCT in the last 72 hours. Microbiology Recent Results (from the past 240 hour(s))  Urine Culture     Status: None   Collection Time: 09/18/19 11:54 AM   Specimen: Urine  Result Value Ref Range Status   MICRO NUMBER: [id number]  Final   SPECIMEN QUALITY: Adequate  Final   Sample Source URINE  Final   STATUS: FINAL  Final   Result: No Growth  Final  Respiratory Panel by RT PCR (Flu A&B, Covid) - Nasopharyngeal Swab     Status: None   Collection Time: 09/19/19  2:53 PM   Specimen: Nasopharyngeal Swab  Result Value Ref Range Status   SARS Coronavirus 2 by RT PCR NEGATIVE NEGATIVE Final    Comment: (NOTE) SARS-CoV-2 target nucleic acids are NOT DETECTED. The SARS-CoV-2 RNA is generally detectable in upper respiratoy specimens during the acute phase of infection. The lowest concentration of SARS-CoV-2 viral  copies this assay can detect is 131 copies/mL. A negative result does not preclude SARS-Cov-2 infection and should not be used as the sole basis for treatment or other patient management decisions. A negative result may occur with  improper specimen collection/handling, submission of specimen other than nasopharyngeal swab, presence of viral mutation(s) within the areas targeted by this assay, and inadequate number of viral copies (<131 copies/mL). A negative result must be combined with clinical observations, patient history, and epidemiological information. The expected result is Negative. Fact Sheet for Patients:  PinkCheek.be Fact Sheet for Healthcare Providers:  GravelBags.it This test is not yet ap proved or cleared by the [place] FDA and  has been authorized for detection and/or diagnosis of SARS-CoV-2 by FDA under an Emergency Use Authorization (EUA). This EUA will remain  in effect (meaning this test can be used) for the duration of the COVID-19 declaration under Section 564(b)(1) of the Act, 21 U.S.C. section 360bbb-3(b)(1), unless the authorization is terminated or revoked sooner.    Influenza A by PCR NEGATIVE NEGATIVE Final   Influenza B by PCR NEGATIVE NEGATIVE Final    Comment: (NOTE) The Xpert Xpress SARS-CoV-2/FLU/RSV assay is intended as an aid in  the diagnosis of influenza from Nasopharyngeal swab specimens and  should not be used as a sole basis for treatment. Nasal washings and  aspirates are unacceptable for Xpert Xpress SARS-CoV-2/FLU/RSV  testing. Fact Sheet for Patients: PinkCheek.be Fact Sheet for Healthcare Providers: GravelBags.it This test is not yet approved or cleared by the [place] FDA and  has been authorized for detection and/or diagnosis of SARS-CoV-2 by  FDA under an Emergency Use Authorization (EUA). This EUA will  remain  in effect (meaning this test can be used) for the duration of the  Covid-19 declaration under Section 564(b)(1) of the Act, 21  U.S.C. section 360bbb-3(b)(1), unless the authorization is  terminated  or revoked. Performed at [facility], [place] [address] [place], [place] [address]      Discharge Instructions:   Discharge Instructions     Call MD for:  persistant dizziness or light-headedness   Complete by: As directed    Call MD for:  persistant nausea and vomiting   Complete by: As directed    Call MD for:  severe uncontrolled pain   Complete by: As directed    Call MD for:  temperature >100.4   Complete by: As directed    Diet - low sodium heart healthy   Complete by: As directed    Discharge instructions   Complete by: As directed    Take medications as prescibed Follow up with PCP 1-2 weeks for evaluation of diabetes control and BP control and kidney function See Urology 3/18 at 10:30 dr pace Follow carb modified diet   Increase activity slowly   Complete by: As directed       Allergies as of 09/23/2019       Reactions   Ace Inhibitors Other (See Comments)   Unknown   Ibuprofen Other (See Comments)   GI upset   Invokana [canagliflozin] Other (See Comments)   Unknown   Metformin And Related Diarrhea   Quinidine Other (See Comments)   Unknown        Medication List     STOP taking these medications    hydrochlorothiazide 25 MG tablet Commonly known as: HYDRODIURIL   Janumet 50-500 MG tablet Generic drug: sitaGLIPtin-metformin   losartan 100 MG tablet Commonly known as: COZAAR   Magnesium 500 MG Tabs Replaced by: magnesium oxide 400 (241.3 Mg) MG tablet   meloxicam 15 MG tablet Commonly known as: MOBIC   tiZANidine 4 MG tablet Commonly known as: Zanaflex       TAKE these medications    amLODipine 10 MG tablet Commonly known as: NORVASC Take 1 tablet (10 mg total) by mouth daily. Start taking on: September 24, 2019   aspirin  EC 81 MG tablet Take 81 mg by mouth daily.   atenolol 100 MG tablet Commonly known as: TENORMIN Take 1 tablet Daily for BP What changed:  how much to take how to take this when to take this additional instructions   B-D 3CC LUER-LOK SYR 21GX1" 21G X 1" 3 ML Misc Generic drug: SYRINGE-NEEDLE (DISP) 3 ML use as directed   Blood Glucose Monitoring Suppl Devi Use meter three times a day to check blood sugar   OneTouch Verio w/Device Kit Test blood sugar once daily.   Cinnamon 500 MG capsule Take 1,000 mg by mouth daily.   famotidine 40 MG tablet Commonly known as: PEPCID Take 40 mg by mouth at bedtime.   glucose blood test strip Test blood sugar three times a day   OneTouch Verio test strip Generic drug: glucose blood Test blood sugar once daily   glyBURIDE 5 MG tablet Commonly known as: DIABETA TAKE 1/2 TO 1 TABLET BY MOUTH THREE TIMES DAILY BEFORE MEALS AS DIRECTED FOR DIABETES What changed:  how much to take how to take this when to take this additional instructions   linagliptin 5 MG Tabs tablet Commonly known as: TRADJENTA Take 1 tablet (5 mg total) by mouth daily. Start taking on: September 24, 2019   magnesium oxide 400 (241.3 Mg) MG tablet Commonly known as: MAG-OX Take 1 tablet (400 mg total) by mouth daily. Start taking on: September 24, 2019 Replaces: Magnesium 500 MG Tabs  meclizine 25 MG tablet Commonly known as: ANTIVERT Take 25 mg by mouth 3 (three) times daily as needed for dizziness.   methocarbamol 500 MG tablet Commonly known as: Robaxin Take 1 or 2 tablets 4 x /day for Muscle Spasm What changed:  how much to take how to take this when to take this additional instructions   ondansetron 8 MG tablet Commonly known as: Zofran Take 1/2 to 1 tablet 3 x day if needed for Nausea What changed:  how much to take how to take this when to take this reasons to take this additional instructions   onetouch ultrasoft lancets Test blood sugar once  daily   oxyCODONE 5 MG immediate release tablet Commonly known as: Oxy IR/ROXICODONE Take 1 tablet (5 mg total) by mouth every 6 (six) hours as needed for up to 8 days for moderate pain or severe pain.   tamsulosin 0.4 MG Caps capsule Commonly known as: FLOMAX Take 1 tablet Daily for Prostate What changed:  how much to take how to take this when to take this additional instructions   TURMERIC PO Take 1 capsule by mouth daily.   vitamin C 500 MG tablet Commonly known as: ASCORBIC ACID Take 500 mg by mouth 2 (two) times daily.   Vitamin D3 50 MCG (2000 UT) Tabs Take 8,000 Units by mouth daily.   zinc gluconate 50 MG tablet Take 50 mg by mouth daily.           Time coordinating discharge: 45 minutes  Signed:  Radene Gunning NP  [facility] 09/23/2019, 1:19 PM

## 2019-09-24 ENCOUNTER — Encounter: Payer: Self-pay | Admitting: Internal Medicine

## 2019-09-24 ENCOUNTER — Telehealth: Payer: Self-pay | Admitting: *Deleted

## 2019-09-24 NOTE — Telephone Encounter (Signed)
I left a message for the patient to return my call.

## 2019-09-24 NOTE — Telephone Encounter (Signed)
Called patient on 09/24/2019 , 11:29 AM in an attempt to reach the patient for a hospital follow up.  Spoke with the patient's spouse. Admit date: 09/20/19 Discharge: 09/23/19   He does have any questions or concerns about medications from the hospital admission. Spouse is concerned about the patient's edema and shortness of breath.  Per DrMcKeown, the patient was advised he cannot restart his diuretics, due to his poor kidney functions and should limit activity until his hospital follow up visit on 09/29/2019 with Vicie Mutters. The spouse was advised to take the patient to the ED, if his shortness of breath worsened. The patient's medications were reviewed over the phone, they were counseled to bring in all current medications to the hospital follow up visit.   I advised the patient to call if any questions or concerns arise about the hospital admission or medications.Samples of Tradjenta were given to the patient and a prior authorization is being done to approve the medication.    Home health was not started in the hospital. A referral is being sent to Nephrology for the patient. All questions were answered and a follow up appointment was made.   Prior to Admission medications   Medication Sig Start Date End Date Taking? Authorizing Provider  amLODipine (NORVASC) 10 MG tablet Take 1 tablet (10 mg total) by mouth daily. 09/24/19   Radene Gunning, NP  aspirin EC 81 MG tablet Take 81 mg by mouth daily.    [provider]  atenolol (TENORMIN) 100 MG tablet Take 1 tablet Daily for BP Patient taking differently: Take 100 mg by mouth daily.  07/01/19   Unk Pinto, MD  B-D 3CC LUER-LOK SYR 02DX4" 21G X 1" 3 ML MISC use as directed 10/17/16   Unk Pinto, MD  Blood Glucose Monitoring Suppl (ONETOUCH VERIO) w/Device KIT Test blood sugar once daily. 03/03/19   Liane Comber, NP  Blood Glucose Monitoring Suppl DEVI Use meter three times a day to check blood sugar 05/20/18   Liane Comber,  NP  Cholecalciferol (VITAMIN D3) 2000 units TABS Take 8,000 Units by mouth daily.    [provider]  Cinnamon 500 MG capsule Take 1,000 mg by mouth daily.     [provider]  famotidine (PEPCID) 40 MG tablet Take 40 mg by mouth at bedtime. 09/01/19   [provider]  glucose blood (ONETOUCH VERIO) test strip Test blood sugar once daily 03/03/19   Liane Comber, NP  glucose blood test strip Test blood sugar three times a day 05/20/18   Liane Comber, NP  glyBURIDE (DIABETA) 5 MG tablet TAKE 1/2 TO 1 TABLET BY MOUTH THREE TIMES DAILY BEFORE MEALS AS DIRECTED FOR DIABETES Patient taking differently: Take 2.5-5 mg by mouth See admin instructions.  03/24/19   Unk Pinto, MD  Lancets [facility] ULTRASOFT) lancets Test blood sugar once daily 03/03/19   Liane Comber, NP  linagliptin (TRADJENTA) 5 MG TABS tablet Take 1 tablet (5 mg total) by mouth daily. 09/24/19   Black, Lezlie Octave, NP  magnesium oxide (MAG-OX) 400 (241.3 Mg) MG tablet Take 1 tablet (400 mg total) by mouth daily. 09/24/19   Black, Lezlie Octave, NP  meclizine (ANTIVERT) 25 MG tablet Take 25 mg by mouth 3 (three) times daily as needed for dizziness.    [provider]  methocarbamol (ROBAXIN) 500 MG tablet Take 1 or 2 tablets 4 x /day for Muscle Spasm Patient taking differently: Take 500-1,000 mg by mouth 4 (four) times daily.  09/11/19  Unk Pinto, MD  ondansetron (ZOFRAN) 8 MG tablet Take 1/2 to 1 tablet 3 x day if needed for Nausea Patient taking differently: Take 4-8 mg by mouth 3 (three) times daily as needed for nausea.  02/08/19   Unk Pinto, MD  oxyCODONE (OXY IR/ROXICODONE) 5 MG immediate release tablet Take 1 tablet (5 mg total) by mouth every 6 (six) hours as needed for up to 8 days for moderate pain or severe pain. 09/23/19 10/01/19  Radene Gunning, NP  tamsulosin (FLOMAX) 0.4 MG CAPS capsule Take 1 tablet Daily for Prostate Patient taking differently: Take 0.4 mg by mouth daily.  06/15/19    Unk Pinto, MD  TURMERIC PO Take 1 capsule by mouth daily.    [provider]  vitamin C (ASCORBIC ACID) 500 MG tablet Take 500 mg by mouth 2 (two) times daily.    [provider]  zinc gluconate 50 MG tablet Take 50 mg by mouth daily.    [provider]

## 2019-09-26 NOTE — Progress Notes (Deleted)
Hospital follow up  Assessment and Plan: Hospital visit follow up for ***:   All medications were reviewed with patient and family and fully reconciled. All questions answered fully, and patient and family members were encouraged to call the office with any further questions or concerns. Discussed goal to avoid readmission related to this diagnosis.  There are no discontinued medications.  Over 40 minutes of exam, counseling, chart review, and complex, high/moderate level critical decision making was performed this visit.   Future Appointments  Date Time Provider [facility]  09/29/2019 10:30 AM Vicie Mutters, PA-C [facility] None  11/17/2019 11:00 AM Unk Pinto, MD [facility] None     HPI 74 y.o. obese male with history of DM2, CKD, gout, chol, HTN, OSA, kidney stones presents for follow up for transition from recent hospitalization or SNIF stay. Admit date to the hospital was 09/20/19, patient was discharged from the hospital on 09/23/19 and our clinical staff contacted the office the day after discharge to set up a follow up appointment. The discharge summary, medications, and diagnostic test results were reviewed before meeting with the patient. The patient was admitted for:  Acute on chronic renal failure. CT stone study showed a 4 mm p right UPJ calculus and mild hydronephrosis. His baseline Creatinine is 1.2 and it was up to 2.25 in the hospital.  Patient has since seen urology, Dr. [name], on 09/25/19 and he will be set up for right ureteroscopy, laser lithotripsy and ureteral stent.   Has appointment with Dr. Hollie Salk on the 16th.  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 His sugars are not controlled at this time and he does not want to be on insulin.  Lab Results  Component Value Date   HGBA1C 10.4 (H) 09/22/2019    Home health is not involved.   Images while in the hospital: No results found.   Current Outpatient Medications (Endocrine & Metabolic):  .   glyBURIDE (DIABETA) 5 MG tablet, TAKE 1/2 TO 1 TABLET BY MOUTH THREE TIMES DAILY BEFORE MEALS AS DIRECTED FOR DIABETES (Patient taking differently: Take 2.5-5 mg by mouth See admin instructions. ) .  linagliptin (TRADJENTA) 5 MG TABS tablet, Take 1 tablet (5 mg total) by mouth daily.  Current Outpatient Medications (Cardiovascular):  .  amLODipine (NORVASC) 10 MG tablet, Take 1 tablet (10 mg total) by mouth daily. Marland Kitchen  atenolol (TENORMIN) 100 MG tablet, Take 1 tablet Daily for BP (Patient taking differently: Take 100 mg by mouth daily. )   Current Outpatient Medications (Analgesics):  .  aspirin EC 81 MG tablet, Take 81 mg by mouth daily. Marland Kitchen  oxyCODONE (OXY IR/ROXICODONE) 5 MG immediate release tablet, Take 1 tablet (5 mg total) by mouth every 6 (six) hours as needed for up to 8 days for moderate pain or severe pain.   Current Outpatient Medications (Other):  Marland Kitchen  B-D 3CC LUER-LOK SYR 21GX1" 21G X 1" 3 ML MISC, use as directed .  Blood Glucose Monitoring Suppl (ONETOUCH VERIO) w/Device KIT, Test blood sugar once daily. .  Blood Glucose Monitoring Suppl DEVI, Use meter three times a day to check blood sugar .  Cholecalciferol (VITAMIN D3) 2000 units TABS, Take 8,000 Units by mouth daily. .  Cinnamon 500 MG capsule, Take 1,000 mg by mouth daily.  .  famotidine (PEPCID) 40 MG tablet, Take 40 mg by mouth at bedtime. Marland Kitchen  glucose blood (ONETOUCH VERIO) test strip, Test blood sugar once daily .  glucose blood test strip, Test  blood sugar three times a day .  Lancets (ONETOUCH ULTRASOFT) lancets, Test blood sugar once daily .  magnesium oxide (MAG-OX) 400 (241.3 Mg) MG tablet, Take 1 tablet (400 mg total) by mouth daily. .  meclizine (ANTIVERT) 25 MG tablet, Take 25 mg by mouth 3 (three) times daily as needed for dizziness. .  methocarbamol (ROBAXIN) 500 MG tablet, Take 1 or 2 tablets 4 x /day for Muscle Spasm (Patient taking differently: Take 500-1,000 mg by mouth 4 (four) times daily. ) .  ondansetron  (ZOFRAN) 8 MG tablet, Take 1/2 to 1 tablet 3 x day if needed for Nausea (Patient taking differently: Take 4-8 mg by mouth 3 (three) times daily as needed for nausea. ) .  tamsulosin (FLOMAX) 0.4 MG CAPS capsule, Take 1 tablet Daily for Prostate (Patient taking differently: Take 0.4 mg by mouth daily. ) .  TURMERIC PO, Take 1 capsule by mouth daily. .  vitamin C (ASCORBIC ACID) 500 MG tablet, Take 500 mg by mouth 2 (two) times daily. Marland Kitchen  zinc gluconate 50 MG tablet, Take 50 mg by mouth daily.  Past Medical History:  Diagnosis Date  .  Covid-19 Positive (02/06/2019)  02/11/2019  . BPH (benign prostatic hypertrophy)   . Diabetes mellitus without complication ([place])   . GERD (gastroesophageal reflux disease)   . Headache    PMH  . Hepatic steatosis   . Hydroureteronephrosis   . Hypertension   . Hypogonadism male   . Mass    left shoulder AC joint  . OSA (obstructive sleep apnea)    wears CPAP  . PONV (postoperative nausea and vomiting)   . Renal calculi   . Sarcoidosis   . Vitamin D deficiency      Allergies  Allergen Reactions  . Ace Inhibitors Other (See Comments)    Unknown  . Ibuprofen Other (See Comments)    GI upset  . Invokana [Canagliflozin] Other (See Comments)    Unknown  . Metformin And Related Diarrhea  . Quinidine Other (See Comments)    Unknown    ROS: all negative except above.   Physical Exam: There were no vitals filed for this visit. There were no vitals taken for this visit. General Appearance: Well nourished, in no apparent distress. Eyes: PERRLA, EOMs, conjunctiva no swelling or erythema Sinuses: No Frontal/maxillary tenderness ENT/Mouth: Ext aud canals clear, TMs without erythema, bulging. No erythema, swelling, or exudate on post pharynx.  Tonsils not swollen or erythematous. Hearing normal.  Neck: Supple, thyroid normal.  Respiratory: Respiratory effort normal, BS equal bilaterally without rales, rhonchi, wheezing or stridor.  Cardio: RRR with no  MRGs. Brisk peripheral pulses without edema.  Abdomen: Soft, + BS.  Non tender, no guarding, rebound, hernias, masses. Lymphatics: Non tender without lymphadenopathy.  Musculoskeletal: Full ROM, 5/5 strength, normal gait.  Skin: Warm, dry without rashes, lesions, ecchymosis.  Neuro: Cranial nerves intact. Normal muscle tone, no cerebellar symptoms. Sensation intact.  Psych: Awake and oriented X 3, normal affect, Insight and Judgment appropriate.     Vicie Mutters, PA-C 9:08 AM [facility]

## 2019-09-29 ENCOUNTER — Ambulatory Visit: Payer: Managed Care, Other (non HMO) | Admitting: Physician Assistant

## 2019-09-29 ENCOUNTER — Other Ambulatory Visit: Payer: Self-pay | Admitting: Urology

## 2019-09-29 ENCOUNTER — Other Ambulatory Visit (HOSPITAL_COMMUNITY)
Admission: RE | Admit: 2019-09-29 | Discharge: 2019-09-29 | Disposition: A | Discharge status: Home / Self Care | Payer: Managed Care, Other (non HMO) | Source: Ambulatory Visit | Attending: Urology | Admitting: Urology

## 2019-09-29 ENCOUNTER — Encounter: Payer: Self-pay | Admitting: Internal Medicine

## 2019-09-29 ENCOUNTER — Encounter: Payer: Self-pay | Admitting: Physician Assistant

## 2019-09-29 ENCOUNTER — Other Ambulatory Visit: Payer: Self-pay

## 2019-09-29 VITALS — BP 126/82 | HR 72 | Temp 97.6°F | Wt 225.0 lb

## 2019-09-29 DIAGNOSIS — N184 Chronic kidney disease, stage 4 (severe): Secondary | ICD-10-CM

## 2019-09-29 DIAGNOSIS — N179 Acute kidney failure, unspecified: Secondary | ICD-10-CM | POA: Diagnosis not present

## 2019-09-29 DIAGNOSIS — N183 Chronic kidney disease, stage 3 unspecified: Secondary | ICD-10-CM

## 2019-09-29 DIAGNOSIS — Z79899 Other long term (current) drug therapy: Secondary | ICD-10-CM | POA: Diagnosis not present

## 2019-09-29 DIAGNOSIS — N200 Calculus of kidney: Secondary | ICD-10-CM | POA: Diagnosis not present

## 2019-09-29 DIAGNOSIS — E1122 Type 2 diabetes mellitus with diabetic chronic kidney disease: Secondary | ICD-10-CM | POA: Diagnosis not present

## 2019-09-29 DIAGNOSIS — Z20822 Contact with and (suspected) exposure to covid-19: Secondary | ICD-10-CM | POA: Insufficient documentation

## 2019-09-29 DIAGNOSIS — E669 Obesity, unspecified: Secondary | ICD-10-CM

## 2019-09-29 DIAGNOSIS — Z01812 Encounter for preprocedural laboratory examination: Secondary | ICD-10-CM | POA: Insufficient documentation

## 2019-09-29 LAB — SARS CORONAVIRUS 2 (TAT 6-24 HRS): SARS Coronavirus 2: NEGATIVE

## 2019-09-29 MED ORDER — BYDUREON BCISE 2 MG/0.85ML ~~LOC~~ AUIJ: 2.0000 mg | AUTO-INJECTOR | SUBCUTANEOUS | 5 refills | Status: DC

## 2019-09-29 NOTE — Progress Notes (Signed)
DUE TO COVID-19 ONLY ONE VISITOR IS ALLOWED TO COME WITH YOU AND STAY IN THE WAITING ROOM ONLY DURING PRE OP AND PROCEDURE DAY OF SURGERY. THE 1 VISITOR MAY VISIT WITH YOU AFTER SURGERY IN YOUR PRIVATE ROOM DURING VISITING HOURS ONLY!  YOU NEED TO HAVE A COVID 19 TEST ON____done 09/29/2019 ___ @_______ , THIS TEST MUST BE DONE BEFORE SURGERY, COME  [address] [place] , [address].  ([place]) ONCE YOUR COVID TEST IS COMPLETED, PLEASE BEGIN THE QUARANTINE INSTRUCTIONS AS OUTLINED IN YOUR HANDOUT.                COLUM COLT  09/29/2019   Your procedure is scheduled on:  10/01/2019   Report to [facility] Main  Entrance   Report to admitting at   1100 AM     Call this number if you have problems the morning of surgery [phone]    Remember: Do not eat food or drink liquids :After Midnight. BRUSH YOUR TEETH MORNING OF SURGERY AND RINSE YOUR MOUTH OUT, NO CHEWING GUM CANDY OR MINTS. Clear liquids until 0700am    Take these medicines the morning of surgery with A SIP OF WATER:  Amlodipine, Atenolol, Flomax  DO NOT TAKE ANY DIABETIC MEDICATIONS DAY OF YOUR SURGERY                               You may not have any metal on your body including hair pins and              piercings  Do not wear jewelry, , lotions, powders or perfu hours prior to surgery.              Men may shave face and neck.   Do not bring valuables to the hospital. [place].  Contacts, dentures or bridgework may not be worn into surgery.  Leave suitcase in the car. After surgery it may be brought to your room.     Patients discharged the day of surgery will not be allowed to drive home. IF YOU ARE HAVING SURGERY AND GOING HOME THE SAME DAY, YOU MUST HAVE AN ADULT TO DRIVE YOU HOME AND BE WITH YOU FOR 24 HOURS. YOU MAY GO HOME BY TAXI OR UBER OR ORTHERWISE, BUT AN ADULT MUST ACCOMPANY YOU HOME AND STAY WITH YOU FOR 24 HOURS.  Name  and phone number of your driver:  Special Instructions: N/A              Please read over the following fact sheets you were given: _____________________________________________________________________             [facility] - Preparing for Surgery Before surgery, you can play an important role.  Because skin is not sterile, your skin needs to be as free of germs as possible.  You can reduce the number of germs on your skin by washing with CHG (chlorahexidine gluconate) soap before surgery.  CHG is an antiseptic cleaner which kills germs and bonds with the skin to continue killing germs even after washing. Please DO NOT use if you have an allergy to CHG or antibacterial soaps.  If your skin becomes reddened/irritated stop using the CHG and inform your nurse when you arrive at Short Stay. Do not shave (including legs and underarms) for  at least 48 hours prior to the first CHG shower.  You may shave your face/neck. Please follow these instructions carefully:  1.  Shower with CHG Soap the night before surgery and the  morning of Surgery.  2.  If you choose to wash your hair, wash your hair first as usual with your  normal  shampoo.  3.  After you shampoo, rinse your hair and body thoroughly to remove the  shampoo.                           4.  Use CHG as you would any other liquid soap.  You can apply chg directly  to the skin and wash                       Gently with a scrungie or clean washcloth.  5.  Apply the CHG Soap to your body ONLY FROM THE NECK DOWN.   Do not use on face/ open                           Wound or open sores. Avoid contact with eyes, ears mouth and genitals (private parts).                       Wash face,  Genitals (private parts) with your normal soap.             6.  Wash thoroughly, paying special attention to the area where your surgery  will be performed.  7.  Thoroughly rinse your body with warm water from the neck down.  8.  DO NOT shower/wash with your normal  soap after using and rinsing off  the CHG Soap.                9.  Pat yourself dry with a clean towel.            10.  Wear clean pajamas.            11.  Place clean sheets on your bed the night of your first shower and do not  sleep with pets. Day of Surgery : Do not apply any lotions/deodorants the morning of surgery.  Please wear clean clothes to the hospital/surgery center.  FAILURE TO FOLLOW THESE INSTRUCTIONS MAY RESULT IN THE CANCELLATION OF YOUR SURGERY PATIENT SIGNATURE_________________________________  NURSE SIGNATURE__________________________________  ________________________________________________________________________

## 2019-09-29 NOTE — Patient Instructions (Addendum)
  Start Bcise injection as shown once a week.  You may inject in the stomach, thigh or arm. You may experience nausea in the first few days which usually goes away.   You will feel fullness of the stomach with starting the medication and should try to keep the portions at meals small. If any questions or concerns are present call the office  Please check blood sugars at least half the time about 2 hours after any meal and 3 times per week on waking up. Please bring blood sugar monitor to each visit. Recommended blood sugar levels about 2 hours after meal is 140-180 and on waking up 90-130  Somogyi effect  The brain needs two things: oxygen and sugar. If the blood sugar level drops too low in the early morning hours, hormones (such as growth hormone, cortisol, and catecholamines) are released to make sure you brain can still function. These help reverse the low blood sugar level but may lead to blood sugar levels that are higher than normal in the morning. This is common for patient that take insulin at night or do not ear regular snacks.  Please schedule to get up in the middle of the night to check your blood sugar.   This may be happening to you. Please eat a high protein night time snack     Bad carbs also include fruit juice, alcohol, and sweet tea. These are empty calories that do not signal to your brain that you are full.   Please remember the good carbs are still carbs which convert into sugar. So please measure them out no more than 1/2-1 cup of rice, oatmeal, pasta, and beans  Veggies are however free foods! Pile them on.   Not all fruit is created equal. Please see the list below, the fruit at the bottom is higher in sugars than the fruit at the top. Please avoid all dried fruits.

## 2019-09-29 NOTE — Patient Instructions (Addendum)
DUE TO COVID-19 ONLY ONE VISITOR IS ALLOWED TO COME WITH YOU AND STAY IN THE WAITING ROOM ONLY DURING PRE OP AND PROCEDURE DAY OF SURGERY. THE 1 VISITOR MAY VISIT WITH YOU AFTER SURGERY IN YOUR PRIVATE ROOM DURING VISITING HOURS ONLY!   ONCE YOUR COVID TEST IS COMPLETED, PLEASE BEGIN THE QUARANTINE INSTRUCTIONS AS OUTLINED IN YOUR HANDOUT.                Phillip Powell     Your procedure is scheduled on: Wednesday 10/01/2019   Report to [facility] Main  Entrance    Report to admitting at  1100 AM    How to Manage Your Diabetes Before and After Surgery  Why is it important to control my blood sugar before and after surgery? . Improving blood sugar levels before and after surgery helps healing and can limit problems. . A way of improving blood sugar control is eating a healthy diet by: o  Eating less sugar and carbohydrates o  Increasing activity/exercise o  Talking with your doctor about reaching your blood sugar goals . High blood sugars (greater than 180 mg/dL) can raise your risk of infections and slow your recovery, so you will need to focus on controlling your diabetes during the weeks before surgery. . Make sure that the doctor who takes care of your diabetes knows about your planned surgery including the date and location.  How do I manage my blood sugar before surgery? . Check your blood sugar at least 4 times a day, starting 2 days before surgery, to make sure that the level is not too high or low. o Check your blood sugar the morning of your surgery when you wake up and every 2 hours until you get to the Short Stay unit. . If your blood sugar is less than 70 mg/dL, you will need to treat for low blood sugar: o Do not take insulin. o Treat a low blood sugar (less than 70 mg/dL) with  cup of clear juice (cranberry or apple), 4 glucose tablets, OR glucose gel. o Recheck blood sugar in 15 minutes after treatment (to make sure it is greater than 70 mg/dL). If your  blood sugar is not greater than 70 mg/dL on recheck, call [phone] for further instructions. . Report your blood sugar to the short stay nurse when you get to Short Stay.  . If you are admitted to the hospital after surgery: o Your blood sugar will be checked by the staff and you will probably be given insulin after surgery (instead of oral diabetes medicines) to make sure you have good blood sugar levels. o The goal for blood sugar control after surgery is 80-180 mg/dL.   WHAT DO I DO ABOUT MY DIABETES MEDICATION?        The day before surgery, Take ONLY morning and lunch dose of Glyburide (Diabeta)!         The day before surgery, DO NOT TAKE TRADJENTA!  Marland Kitchen Do not take oral diabetes medicines (pills) the morning of surgery.     Call this number if you have problems the morning of surgery [phone]    Remember: Do not eat food  :After Midnight.  May have clear liquids from midnight up until 0700 am then nothing until after surgery!    CLEAR LIQUID DIET   Foods Allowed  Foods Excluded  Coffee and tea, regular and decaf                             liquids that you cannot  Plain Jell-O any favor except red or purple                                           see through such as: Fruit ices (not with fruit pulp)                                     milk, soups, orange juice  Iced Popsicles                                    All solid food Carbonated beverages, regular and diet                                    Cranberry, grape and apple juices Sports drinks like Gatorade Lightly seasoned clear broth or consume(fat free) Sugar, honey syrup  Sample Menu Breakfast                                Lunch                                     Supper Cranberry juice                    Beef broth                            Chicken broth Jell-O                                     Grape juice                            Apple juice Coffee or tea                        Jell-O                                      Popsicle                                                Coffee or tea                        Coffee or tea  _____________________________________________________________________      BRUSH YOUR TEETH MORNING OF SURGERY AND RINSE YOUR MOUTH OUT, NO CHEWING GUM CANDY OR MINTS.  Take these medicines the morning of surgery with A SIP OF WATER: Atenolol (Tenormin), Amlodipine (Norvasc), Tamulosin (Flomax)   DO NOT TAKE ANY DIABETIC MEDICATIONS DAY OF YOUR SURGERY                               You may not have any metal on your body including hair pins and              piercings  Do not wear jewelry, make-up, lotions, powders or perfumes, deodorant                          Men may shave face and neck.   Do not bring valuables to the hospital. [place].  Contacts, dentures or bridgework may not be worn into surgery.  Leave suitcase in the car. After surgery it may be brought to your room.     Patients discharged the day of surgery will not be allowed to drive home. IF YOU ARE HAVING SURGERY AND GOING HOME THE SAME DAY, YOU MUST HAVE AN ADULT TO DRIVE YOU HOME AND  BE WITH YOU FOR 24 HOURS. YOU MAY GO HOME BY TAXI OR UBER OR ORTHERWISE, BUT AN ADULT MUST ACCOMPANY YOU HOME AND STAY WITH YOU FOR 24 HOURS.  Name and phone number of your driver: spouse- Phillip Powell  [phone]               Please read over the following fact sheets you were given: _____________________________________________________________________             [facility] - Preparing for Surgery Before surgery, you can play an important role.  Because skin is not sterile, your skin needs to be as free of germs as possible.  You can reduce the number of germs on your skin by washing with CHG (chlorahexidine gluconate) soap before surgery.  CHG is an antiseptic cleaner which  kills germs and bonds with the skin to continue killing germs even after washing. Please DO NOT use if you have an allergy to CHG or antibacterial soaps.  If your skin becomes reddened/irritated stop using the CHG and inform your nurse when you arrive at Short Stay. Do not shave (including legs and underarms) for at least 48 hours prior to the first CHG shower.  You may shave your face/neck. Please follow these instructions carefully:  1.  Shower with CHG Soap the night before surgery and the  morning of Surgery.  2.  If you choose to wash your hair, wash your hair first as usual with your  normal  shampoo.  3.  After you shampoo, rinse your hair and body thoroughly to remove the  shampoo.                           4.  Use CHG as you would any other liquid soap.  You can apply chg directly  to the skin and wash                       Gently with a scrungie or clean washcloth.  5.  Apply the CHG Soap to your body ONLY FROM THE NECK DOWN.   Do not use on face/ open  Wound or open sores. Avoid contact with eyes, ears mouth and genitals (private parts).                       Wash face,  Genitals (private parts) with your normal soap.             6.  Wash thoroughly, paying special attention to the area where your surgery  will be performed.  7.  Thoroughly rinse your body with warm water from the neck down.  8.  DO NOT shower/wash with your normal soap after using and rinsing off  the CHG Soap.                9.  Pat yourself dry with a clean towel.            10.  Wear clean pajamas.            11.  Place clean sheets on your bed the night of your first shower and do not  sleep with pets. Day of Surgery : Do not apply any lotions/deodorants the morning of surgery.  Please wear clean clothes to the hospital/surgery center.  FAILURE TO FOLLOW THESE INSTRUCTIONS MAY RESULT IN THE CANCELLATION OF YOUR SURGERY PATIENT SIGNATURE_________________________________  NURSE  SIGNATURE__________________________________  ________________________________________________________________________

## 2019-09-29 NOTE — Progress Notes (Signed)
Hospital follow up  Assessment and Plan: Hospital visit follow up for   Renal calculi Follow up Dr. [name] for surgery  AKI (acute kidney injury) [facility]) Monitor kidney function, patient is off offending kidney agents, will check function after obstructive uropathy fixed, stressed importance of fixing sugar to help kidney function.   CKD stage 3 due to type 2 diabetes mellitus ([place]) Uncontrolled DM Patient last A1C was at 10.3, he is unable to take metformin due to worsening kidney function, requesting coverage for trajenta. Given samples of Bcise- trulicity covered, will send that in  Obesity (BMI 30.0-34.9) - follow up 3 months for progress monitoring - increase veggies, decrease carbs - long discussion about weight loss, diet, and exercise  All medications were reviewed with patient and family and fully reconciled. All questions answered fully, and patient and family members were encouraged to call the office with any further questions or concerns. Discussed goal to avoid readmission related to this diagnosis.  Medications Discontinued During This Encounter  Medication Reason  . Cinnamon 500 MG capsule Completed Course  . TURMERIC PO Completed Course  . zinc gluconate 50 MG tablet Patient Preference    Over 40 minutes of exam, counseling, chart review, and complex, high/moderate level critical decision making was performed this visit.   Future Appointments  Date Time Provider [place]  11/17/2019 11:00 AM Unk Pinto, MD [facility] None     HPI 74 y.o. obese male with history of DM2, CKD, gout, chol, HTN, OSA, kidney stones presents for follow up for transition from recent hospitalization or SNIF stay. Admit date to the hospital was 09/20/19, patient was discharged from the hospital on 09/23/19 and our clinical staff contacted the office the day after discharge to set up a follow up appointment. The discharge summary, medications, and diagnostic test results were  reviewed before meeting with the patient. The patient was admitted for:   Acute on chronic renal failure. CT stone study showed a 4 mm p right UPJ calculus and mild hydronephrosis. His baseline Creatinine is 1.2 and it was up to 2.25 in the hospital.   Patient has since seen urology, Dr. [name], on 09/25/19 and he will be set up for right ureteroscopy, laser lithotripsy and ureteral stent. His wife states she has not heard anything. The patient states he is still in pain, is having chills at night, and is very weak.   Has appointment with Dr. Posey Pronto in nephrology on the 16th. His mobic, metformin, and losartan, HCTZ was held in the hospital. He is on amlodipine and flomax and his BP is doing well. Wife states that he is very tired, wears his CPAP at night but is up to urinate a lot in the night.   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 His sugars are not controlled at this time and he does not want to be on insulin. They gave him trajenta in the hospital but his insurance is not covering it. He has been on janumet in the past, he can not take metformin or onglyza due to worsening kidney function.  Wife has had a strict diet, sugar162 and this AM was 211.  He is on glyburide 5 mg TID.  Lab Results  Component Value Date   HGBA1C 10.4 (H) 09/22/2019   Blood pressure 126/82, pulse 72, temperature 97.6 F (36.4 C), weight 225 lb (102.1 kg), SpO2 97 %.   Home health is not involved.   Images while in the hospital: No results  found.   Current Outpatient Medications (Endocrine & Metabolic):  .  glyBURIDE (DIABETA) 5 MG tablet, TAKE 1/2 TO 1 TABLET BY MOUTH THREE TIMES DAILY BEFORE MEALS AS DIRECTED FOR DIABETES (Patient taking differently: Take 2.5-5 mg by mouth See admin instructions. ) .  linagliptin (TRADJENTA) 5 MG TABS tablet, Take 1 tablet (5 mg total) by mouth daily.  Current Outpatient Medications (Cardiovascular):  .  amLODipine (NORVASC) 10 MG tablet, Take 1 tablet (10 mg  total) by mouth daily. Marland Kitchen  atenolol (TENORMIN) 100 MG tablet, Take 1 tablet Daily for BP (Patient taking differently: Take 100 mg by mouth daily. )   Current Outpatient Medications (Analgesics):  .  aspirin EC 81 MG tablet, Take 81 mg by mouth daily. Marland Kitchen  oxyCODONE (OXY IR/ROXICODONE) 5 MG immediate release tablet, Take 1 tablet (5 mg total) by mouth every 6 (six) hours as needed for up to 8 days for moderate pain or severe pain.   Current Outpatient Medications (Other):  Marland Kitchen  B-D 3CC LUER-LOK SYR 21GX1" 21G X 1" 3 ML MISC, use as directed .  Blood Glucose Monitoring Suppl (ONETOUCH VERIO) w/Device KIT, Test blood sugar once daily. .  Blood Glucose Monitoring Suppl DEVI, Use meter three times a day to check blood sugar .  Cholecalciferol (VITAMIN D3) 2000 units TABS, Take 8,000 Units by mouth daily. .  famotidine (PEPCID) 40 MG tablet, Take 40 mg by mouth at bedtime. Marland Kitchen  glucose blood (ONETOUCH VERIO) test strip, Test blood sugar once daily .  glucose blood test strip, Test blood sugar three times a day .  Lancets (ONETOUCH ULTRASOFT) lancets, Test blood sugar once daily .  magnesium oxide (MAG-OX) 400 (241.3 Mg) MG tablet, Take 1 tablet (400 mg total) by mouth daily. .  meclizine (ANTIVERT) 25 MG tablet, Take 25 mg by mouth 3 (three) times daily as needed for dizziness. .  methocarbamol (ROBAXIN) 500 MG tablet, Take 1 or 2 tablets 4 x /day for Muscle Spasm (Patient taking differently: Take 500-1,000 mg by mouth 4 (four) times daily. ) .  ondansetron (ZOFRAN) 8 MG tablet, Take 1/2 to 1 tablet 3 x day if needed for Nausea (Patient taking differently: Take 4-8 mg by mouth 3 (three) times daily as needed for nausea. ) .  tamsulosin (FLOMAX) 0.4 MG CAPS capsule, Take 1 tablet Daily for Prostate (Patient taking differently: Take 0.4 mg by mouth daily. ) .  vitamin C (ASCORBIC ACID) 500 MG tablet, Take 500 mg by mouth 2 (two) times daily.  Past Medical History:  Diagnosis Date  .  Covid-19 Positive  (02/06/2019)  02/11/2019  . BPH (benign prostatic hypertrophy)   . Diabetes mellitus without complication ([place])   . GERD (gastroesophageal reflux disease)   . Headache    PMH  . Hepatic steatosis   . Hydroureteronephrosis   . Hypertension   . Hypogonadism male   . Mass    left shoulder AC joint  . OSA (obstructive sleep apnea)    wears CPAP  . PONV (postoperative nausea and vomiting)   . Renal calculi   . Sarcoidosis   . Vitamin D deficiency      Allergies  Allergen Reactions  . Ace Inhibitors Other (See Comments)    Unknown  . Ibuprofen Other (See Comments)    GI upset  . Invokana [Canagliflozin] Other (See Comments)    Unknown  . Metformin And Related Diarrhea  . Quinidine Other (See Comments)    Unknown    ROS: all  negative except above.   Physical Exam: Filed Weights   09/29/19 1021  Weight: 225 lb (102.1 kg)   BP 126/82   Pulse 72   Temp 97.6 F (36.4 C)   Wt 225 lb (102.1 kg)   SpO2 97%   BMI 34.21 kg/m  General Appearance: Well nourished, in no apparent distress. Eyes: PERRLA, EOMs, conjunctiva no swelling or erythema Sinuses: No Frontal/maxillary tenderness ENT/Mouth: Ext aud canals clear, TMs without erythema, bulging. No erythema, swelling, or exudate on post pharynx.  Tonsils not swollen or erythematous. Hearing normal.  Neck: Supple, thyroid normal.  Respiratory: Respiratory effort normal, BS equal bilaterally without rales, rhonchi, wheezing or stridor.  Cardio: RRR with no MRGs. Brisk peripheral pulses without edema.  Abdomen: Soft, + BS.  Non tender, no guarding, rebound, hernias, masses. Lymphatics: Non tender without lymphadenopathy.  Musculoskeletal: Full ROM, 5/5 strength, normal gait.  Skin: Warm, dry without rashes, lesions, ecchymosis.  Neuro: Cranial nerves intact. Normal muscle tone, no cerebellar symptoms.  Psych: Awake and oriented X 3, normal affect, Insight and Judgment appropriate.     Vicie Mutters, PA-C 11:04  AM [facility]

## 2019-09-30 ENCOUNTER — Encounter (HOSPITAL_COMMUNITY)
Admission: RE | Admit: 2019-09-30 | Discharge: 2019-09-30 | Disposition: A | Discharge status: Home / Self Care | Payer: Managed Care, Other (non HMO) | Source: Ambulatory Visit | Attending: Urology | Admitting: Urology

## 2019-09-30 ENCOUNTER — Other Ambulatory Visit: Payer: Self-pay | Admitting: Physician Assistant

## 2019-09-30 ENCOUNTER — Other Ambulatory Visit: Payer: Self-pay

## 2019-09-30 ENCOUNTER — Encounter (HOSPITAL_COMMUNITY): Payer: Self-pay

## 2019-09-30 DIAGNOSIS — R0602 Shortness of breath: Secondary | ICD-10-CM

## 2019-09-30 DIAGNOSIS — D72823 Leukemoid reaction: Secondary | ICD-10-CM

## 2019-09-30 DIAGNOSIS — R350 Frequency of micturition: Secondary | ICD-10-CM

## 2019-09-30 HISTORY — DX: Personal history of urinary calculi: Z87.442

## 2019-09-30 LAB — CBC WITH DIFFERENTIAL/PLATELET
Absolute Monocytes: 748 cells/uL (ref 200–950)
Basophils Absolute: 55 cells/uL (ref 0–200)
Basophils Relative: 0.5 %
Eosinophils Absolute: 55 cells/uL (ref 15–500)
Eosinophils Relative: 0.5 %
HCT: 41.4 % (ref 38.5–50.0)
Hemoglobin: 13.9 g/dL (ref 13.2–17.1)
Lymphs Abs: 1485 cells/uL (ref 850–3900)
MCH: 27.6 pg (ref 27.0–33.0)
MCHC: 33.6 g/dL (ref 32.0–36.0)
MCV: 82.1 fL (ref 80.0–100.0)
MPV: 9.4 fL (ref 7.5–12.5)
Monocytes Relative: 6.8 %
Neutro Abs: 8657 cells/uL — ABNORMAL HIGH (ref 1500–7800)
Neutrophils Relative %: 78.7 %
Platelets: 219 10*3/uL (ref 140–400)
RBC: 5.04 10*6/uL (ref 4.20–5.80)
RDW: 13.1 % (ref 11.0–15.0)
Total Lymphocyte: 13.5 %
WBC: 11 10*3/uL — ABNORMAL HIGH (ref 3.8–10.8)

## 2019-09-30 LAB — COMPLETE METABOLIC PANEL WITH GFR
AG Ratio: 1.4 (calc) (ref 1.0–2.5)
ALT: 17 U/L (ref 9–46)
AST: 9 U/L — ABNORMAL LOW (ref 10–35)
Albumin: 3.7 g/dL (ref 3.6–5.1)
Alkaline phosphatase (APISO): 70 U/L (ref 35–144)
BUN/Creatinine Ratio: 21 (calc) (ref 6–22)
BUN: 40 mg/dL — ABNORMAL HIGH (ref 7–25)
CO2: 27 mmol/L (ref 20–32)
Calcium: 9.4 mg/dL (ref 8.6–10.3)
Chloride: 97 mmol/L — ABNORMAL LOW (ref 98–110)
Creat: 1.94 mg/dL — ABNORMAL HIGH (ref 0.70–1.18)
GFR, Est African American: 39 mL/min/{1.73_m2} — ABNORMAL LOW (ref >=60)
GFR, Est Non African American: 34 mL/min/{1.73_m2} — ABNORMAL LOW (ref >=60)
Globulin: 2.7 g/dL (calc) (ref 1.9–3.7)
Glucose, Bld: 306 mg/dL — ABNORMAL HIGH (ref 65–99)
Potassium: 4.7 mmol/L (ref 3.5–5.3)
Sodium: 134 mmol/L — ABNORMAL LOW (ref 135–146)
Total Bilirubin: 0.7 mg/dL (ref 0.2–1.2)
Total Protein: 6.4 g/dL (ref 6.1–8.1)

## 2019-09-30 LAB — TSH: TSH: 1.02 mIU/L (ref 0.40–4.50)

## 2019-09-30 LAB — MAGNESIUM: Magnesium: 2 mg/dL (ref 1.5–2.5)

## 2019-09-30 MED ORDER — TRULICITY 1.5 MG/0.5ML ~~LOC~~ SOAJ: 1.5000 mg | SUBCUTANEOUS | 3 refills | Status: DC

## 2019-09-30 NOTE — Progress Notes (Signed)
PCP - Dr. Unk Pinto  LOV- 09/29/2019 with [place] 09/29/2019-labs at Dr. Trinna Post, [place], CBC w/diff. Cardiologist -n/a  Scheduled to see Dr. Posey Pronto, Nephrologist on 10/07/2019  Chest x-ray - 09/19/2019- 1 view epic;  04/14/2019- 2 view  epic EKG - 09/19/2019  epic Stress Test - n/a ECHO - n/a Cardiac Cath - n/a  Sleep Study - 10/01/2014 CPAP - yes  Fasting Blood Sugar - 165-225 Checks Blood Sugar __1___ times a day  Blood Thinner Instructions:n/a Aspirin Instructions:Aspirin 81 mg prescribed by Dr. Melford Aase Last Dose:09/28/2019  Anesthesia review:  Chart given to Konrad Felix, PA to review.  Patient has a history of DM type 2, BPH, HTN, OSA  (uese CPAP), sarcoidosis (wife states resolved), and CKD stage2-3.   Patient denies shortness of breath, fever, cough and chest pain at PAT appointment   Patient verbalized understanding of instructions that were given to them at the PAT appointment. Patient was also instructed that they will need to review over the PAT instructions again at home before surgery.

## 2019-09-30 NOTE — Progress Notes (Signed)
Anesthesia Chart Review   Case: [id number] Date/Time: 10/01/19 1239   Procedure: CYSTOSCOPY/RETROGRADE/URETEROSCOPY/HOLMIUM LASER/STENT PLACEMENT (Right ) - ONLY NEEDS 45 MIN   Anesthesia type: General   Pre-op diagnosis: RIGHT URETEROPELVIC JUNCTION STONE   Location: [facility] ROOM 08 / [facility]   Surgeons: Ceasar Mons, MD      DISCUSSION:73 y.o. never smoker with h/o PONV, HTN, poorly controlled DM II, GERD, BPH, OSA w/CPAP, right ureteropelvic junction stone scheduled for above procedure 10/01/19 with Dr. Harrell Gave Phillip Powell.   Recent admission 2/27-09/23/2019 with AKI on CKD Stage II with 19m proximal right ureteral calculi. Creatinine 1.8 down from 2.2 at discharge.    Poorly controlled diabetes with most recent A1C 10.4.  Followed up with PCP 09/29/2019, adjustments made to DM medications.  Will evaluate blood sugar DOS.    Anticipate pt can proceed with planned procedure barring acute status change.   VS: Ht 5' 8" (1.727 m)   Wt 100.7 kg   BMI 33.75 kg/m   PROVIDERS: MUnk Pinto MD is PCP    LABS: Labs reviewed: Acceptable for surgery. (all labs ordered are listed, but only abnormal results are displayed)  Labs Reviewed - No data to display   IMAGES:   EKG: 09/19/19 Rate 67 bpm Sinus rhythm  Consider left atrial enlargement When compared to prior, more artifact  No STEMI  CV:  Past Medical History:  Diagnosis Date  .  Covid-19 Positive (02/06/2019)  02/11/2019  . BPH (benign prostatic hypertrophy)   . [place]   . GERD (gastroesophageal reflux disease)   . Headache    PMH  . Hepatic steatosis   . History of kidney stones   . Hydroureteronephrosis   . Hypertension   . Hypogonadism male   . Mass    left shoulder AC joint  . OSA (obstructive sleep apnea)    wears CPAP  . PONV (postoperative nausea and vomiting)   . Renal calculi   . Sarcoidosis   . Vitamin D deficiency     Past Surgical History:  Procedure  Laterality Date  . APPENDECTOMY    . COLONOSCOPY    . HAND SURGERY Right 2019   Dr. GAmedeo Plenty tendon repair of R thumb  . HERNIA REPAIR  [id number]  umbilical  . KNEE ARTHROSCOPY    . ORIF CLAVICULAR FRACTURE Left 03/29/2018   Procedure: LEFT SHOULDER OPEN DISTAL CLAVICLE RESECTION, CYST EXCISION;  Surgeon: NNetta Cedars MD;  [facility]  Service: [place]opedics;  Laterality: Left;  . ROTATOR CUFF REPAIR Left 1996  . ROTATOR CUFF REPAIR Right 1997  . TONSILLECTOMY  1952    MEDICATIONS: . amLODipine (NORVASC) 10 MG tablet  . aspirin EC 81 MG tablet  . atenolol (TENORMIN) 100 MG tablet  . B-D 3CC LUER-LOK SYR 21GX1" 21G X 1" 3 ML MISC  . Blood Glucose Monitoring Suppl (ONETOUCH VERIO) w/Device KIT  . Blood Glucose Monitoring Suppl DEVI  . Cholecalciferol (VITAMIN D3) 2000 units TABS  . Dulaglutide (TRULICITY) 1.5 MYB/6.3SLSOPN  . Exenatide ER (BYDUREON BCISE) 2 MG/0.85ML AUIJ  . famotidine (PEPCID) 40 MG tablet  . glucose blood (ONETOUCH VERIO) test strip  . glucose blood test strip  . glyBURIDE (DIABETA) 5 MG tablet  . Lancets (ONETOUCH ULTRASOFT) lancets  . linagliptin (TRADJENTA) 5 MG TABS tablet  . magnesium oxide (MAG-OX) 400 (241.3 Mg) MG tablet  . meclizine (ANTIVERT) 25 MG tablet  . methocarbamol (ROBAXIN) 500 MG tablet  . ondansetron (ZOFRAN) 8 MG  tablet  . oxyCODONE (OXY IR/ROXICODONE) 5 MG immediate release tablet  . tamsulosin (FLOMAX) 0.4 MG CAPS capsule  . vitamin C (ASCORBIC ACID) 500 MG tablet   No current facility-administered medications for this encounter.   Jessica Zanetto, PA-C [facility] [phone] 09/30/19  3:36 PM         

## 2019-10-01 ENCOUNTER — Ambulatory Visit (HOSPITAL_COMMUNITY): Payer: Managed Care, Other (non HMO) | Admitting: Registered Nurse

## 2019-10-01 ENCOUNTER — Ambulatory Visit (HOSPITAL_COMMUNITY): Payer: Managed Care, Other (non HMO) | Admitting: Physician Assistant

## 2019-10-01 ENCOUNTER — Ambulatory Visit (HOSPITAL_COMMUNITY)
Admission: RE | Admit: 2019-10-01 | Discharge: 2019-10-01 | Disposition: A | Discharge status: Home / Self Care | Payer: Managed Care, Other (non HMO) | Attending: Urology | Admitting: Urology

## 2019-10-01 ENCOUNTER — Other Ambulatory Visit: Payer: Self-pay

## 2019-10-01 ENCOUNTER — Ambulatory Visit (HOSPITAL_COMMUNITY): Payer: Managed Care, Other (non HMO)

## 2019-10-01 ENCOUNTER — Encounter (HOSPITAL_COMMUNITY)
Admission: RE | Disposition: A | Discharge status: Home / Self Care | Payer: Self-pay | Source: Home / Self Care | Attending: Urology

## 2019-10-01 ENCOUNTER — Encounter (HOSPITAL_COMMUNITY): Payer: Self-pay | Admitting: Urology

## 2019-10-01 DIAGNOSIS — Z833 Family history of diabetes mellitus: Secondary | ICD-10-CM | POA: Insufficient documentation

## 2019-10-01 DIAGNOSIS — E7800 Pure hypercholesterolemia, unspecified: Secondary | ICD-10-CM | POA: Insufficient documentation

## 2019-10-01 DIAGNOSIS — Z79899 Other long term (current) drug therapy: Secondary | ICD-10-CM | POA: Insufficient documentation

## 2019-10-01 DIAGNOSIS — Z809 Family history of malignant neoplasm, unspecified: Secondary | ICD-10-CM | POA: Diagnosis not present

## 2019-10-01 DIAGNOSIS — R0681 Apnea, not elsewhere classified: Secondary | ICD-10-CM | POA: Insufficient documentation

## 2019-10-01 DIAGNOSIS — Z6833 Body mass index (BMI) 33.0-33.9, adult: Secondary | ICD-10-CM | POA: Diagnosis not present

## 2019-10-01 DIAGNOSIS — N132 Hydronephrosis with renal and ureteral calculous obstruction: Secondary | ICD-10-CM | POA: Diagnosis not present

## 2019-10-01 DIAGNOSIS — R519 Headache, unspecified: Secondary | ICD-10-CM | POA: Diagnosis not present

## 2019-10-01 DIAGNOSIS — Z7982 Long term (current) use of aspirin: Secondary | ICD-10-CM | POA: Diagnosis not present

## 2019-10-01 DIAGNOSIS — Z8349 Family history of other endocrine, nutritional and metabolic diseases: Secondary | ICD-10-CM | POA: Insufficient documentation

## 2019-10-01 DIAGNOSIS — Z87442 Personal history of urinary calculi: Secondary | ICD-10-CM | POA: Insufficient documentation

## 2019-10-01 DIAGNOSIS — I10 Essential (primary) hypertension: Secondary | ICD-10-CM | POA: Insufficient documentation

## 2019-10-01 DIAGNOSIS — K760 Fatty (change of) liver, not elsewhere classified: Secondary | ICD-10-CM | POA: Diagnosis not present

## 2019-10-01 DIAGNOSIS — Z7984 Long term (current) use of oral hypoglycemic drugs: Secondary | ICD-10-CM | POA: Diagnosis not present

## 2019-10-01 DIAGNOSIS — K219 Gastro-esophageal reflux disease without esophagitis: Secondary | ICD-10-CM | POA: Insufficient documentation

## 2019-10-01 HISTORY — PX: CYSTOSCOPY/URETEROSCOPY/HOLMIUM LASER/STENT PLACEMENT: SHX6546

## 2019-10-01 LAB — GLUCOSE, CAPILLARY
Glucose-Capillary: 232 mg/dL — ABNORMAL HIGH (ref 70–99)
Glucose-Capillary: 243 mg/dL — ABNORMAL HIGH (ref 70–99)

## 2019-10-01 SURGERY — CYSTOSCOPY/URETEROSCOPY/HOLMIUM LASER/STENT PLACEMENT
Anesthesia: General | Laterality: Right

## 2019-10-01 MED ORDER — PHENAZOPYRIDINE HCL 200 MG PO TABS: 200.0000 mg | ORAL_TABLET | Freq: Three times a day (TID) | ORAL | 0 refills | Status: DC | PRN

## 2019-10-01 MED ORDER — LIDOCAINE 2% (20 MG/ML) 5 ML SYRINGE
INTRAMUSCULAR | Status: DC | PRN
  Administered 2019-10-01: 60 mg via INTRAVENOUS

## 2019-10-01 MED ORDER — PROPOFOL 10 MG/ML IV BOLUS
INTRAVENOUS | Status: DC | PRN
  Administered 2019-10-01: 150 mg via INTRAVENOUS

## 2019-10-01 MED ORDER — PROPOFOL 500 MG/50ML IV EMUL
INTRAVENOUS | Status: DC | PRN
  Administered 2019-10-01: 150 ug/kg/min via INTRAVENOUS

## 2019-10-01 MED ORDER — FENTANYL CITRATE (PF) 100 MCG/2ML IJ SOLN
INTRAMUSCULAR | Status: DC | PRN
  Administered 2019-10-01: 25 ug via INTRAVENOUS
  Administered 2019-10-01: 50 ug via INTRAVENOUS
  Administered 2019-10-01: 25 ug via INTRAVENOUS

## 2019-10-01 MED ORDER — CEFAZOLIN SODIUM-DEXTROSE 2-4 GM/100ML-% IV SOLN
2.0000 g | Freq: Once | INTRAVENOUS | Status: AC
  Administered 2019-10-01: 2 g via INTRAVENOUS
  Filled 2019-10-01: qty 100

## 2019-10-01 MED ORDER — OXYCODONE HCL 5 MG PO TABS: 5.0000 mg | ORAL_TABLET | Freq: Once | ORAL | Status: DC | PRN

## 2019-10-01 MED ORDER — LACTATED RINGERS IV SOLN: INTRAVENOUS | Status: DC

## 2019-10-01 MED ORDER — OXYCODONE HCL 5 MG PO TABS: 5.0000 mg | ORAL_TABLET | ORAL | 0 refills | Status: DC | PRN

## 2019-10-01 MED ORDER — LIDOCAINE 2% (20 MG/ML) 5 ML SYRINGE
INTRAMUSCULAR | Status: AC
  Filled 2019-10-01: qty 5

## 2019-10-01 MED ORDER — FENTANYL CITRATE (PF) 100 MCG/2ML IJ SOLN
INTRAMUSCULAR | Status: AC
  Filled 2019-10-01: qty 2

## 2019-10-01 MED ORDER — ACETAMINOPHEN 160 MG/5ML PO SOLN: 1000.0000 mg | Freq: Once | ORAL | Status: DC | PRN

## 2019-10-01 MED ORDER — FENTANYL CITRATE (PF) 100 MCG/2ML IJ SOLN: 25.0000 ug | INTRAMUSCULAR | Status: DC | PRN

## 2019-10-01 MED ORDER — DEXAMETHASONE SODIUM PHOSPHATE 10 MG/ML IJ SOLN
INTRAMUSCULAR | Status: AC
  Filled 2019-10-01: qty 1

## 2019-10-01 MED ORDER — ACETAMINOPHEN 500 MG PO TABS: 1000.0000 mg | ORAL_TABLET | Freq: Once | ORAL | Status: DC | PRN

## 2019-10-01 MED ORDER — ONDANSETRON HCL 4 MG/2ML IJ SOLN
INTRAMUSCULAR | Status: AC
  Filled 2019-10-01: qty 2

## 2019-10-01 MED ORDER — OXYCODONE HCL 5 MG/5ML PO SOLN: 5.0000 mg | Freq: Once | ORAL | Status: DC | PRN

## 2019-10-01 MED ORDER — PROPOFOL 1000 MG/100ML IV EMUL
INTRAVENOUS | Status: AC
  Filled 2019-10-01: qty 100

## 2019-10-01 MED ORDER — DEXAMETHASONE SODIUM PHOSPHATE 10 MG/ML IJ SOLN
INTRAMUSCULAR | Status: DC | PRN
  Administered 2019-10-01: 10 mg via INTRAVENOUS

## 2019-10-01 MED ORDER — IOHEXOL 300 MG/ML  SOLN
INTRAMUSCULAR | Status: DC | PRN
  Administered 2019-10-01: 10 mL

## 2019-10-01 MED ORDER — SODIUM CHLORIDE 0.9 % IR SOLN
Status: DC | PRN
  Administered 2019-10-01: 3000 mL

## 2019-10-01 MED ORDER — ACETAMINOPHEN 10 MG/ML IV SOLN: 1000.0000 mg | Freq: Once | INTRAVENOUS | Status: DC | PRN

## 2019-10-01 MED ORDER — OXYBUTYNIN CHLORIDE 5 MG PO TABS: 5.0000 mg | ORAL_TABLET | Freq: Three times a day (TID) | ORAL | 1 refills | Status: DC | PRN

## 2019-10-01 MED ORDER — ONDANSETRON HCL 4 MG/2ML IJ SOLN
INTRAMUSCULAR | Status: DC | PRN
  Administered 2019-10-01: 4 mg via INTRAVENOUS

## 2019-10-01 MED ORDER — CEPHALEXIN 500 MG PO CAPS: 500.0000 mg | ORAL_CAPSULE | Freq: Two times a day (BID) | ORAL | 0 refills | Status: AC

## 2019-10-01 SURGICAL SUPPLY — 20 items
BAG URO CATCHER STRL LF (MISCELLANEOUS) ×3 IMPLANT
BASKET ZERO TIP NITINOL 2.4FR (BASKET) IMPLANT
BSKT STON RTRVL ZERO TP 2.4FR (BASKET)
CATH URET 5FR 28IN OPEN ENDED (CATHETERS) ×3 IMPLANT
CLOTH BEACON ORANGE TIMEOUT ST (SAFETY) ×3 IMPLANT
EXTRACTOR STONE NITINOL NGAGE (UROLOGICAL SUPPLIES) IMPLANT
FIBER LASER FLEXIVA 365 (UROLOGICAL SUPPLIES) IMPLANT
FIBER LASER TRAC TIP (UROLOGICAL SUPPLIES) ×2 IMPLANT
GLOVE BIOGEL M STRL SZ7.5 (GLOVE) ×3 IMPLANT
GOWN STRL REUS W/TWL XL LVL3 (GOWN DISPOSABLE) ×3 IMPLANT
GUIDEWIRE ZIPWRE .038 STRAIGHT (WIRE) ×3 IMPLANT
KIT TURNOVER KIT A (KITS) IMPLANT
MANIFOLD NEPTUNE II (INSTRUMENTS) ×3 IMPLANT
PACK CYSTO (CUSTOM PROCEDURE TRAY) ×3 IMPLANT
SHEATH URETERAL 12FRX35CM (MISCELLANEOUS) IMPLANT
STENT URET 6FRX24 CONTOUR (STENTS) ×2 IMPLANT
STENT URET 6FRX26 CONTOUR (STENTS) IMPLANT
TUBING CONNECTING 10 (TUBING) ×2 IMPLANT
TUBING CONNECTING 10' (TUBING) ×1
TUBING UROLOGY SET (TUBING) ×3 IMPLANT

## 2019-10-01 NOTE — H&P (Signed)
PRE-OP H&P  Office Visit Report     09/25/2019   Phillip Powell  MRN: [id number]  DOB: 26-Aug-1945, 74 year old Male  PRIMARY CARE:  Unk Pinto, MD  REFERRING:    PROVIDER:  Jacalyn Lefevre, M.D.  TREATING:  Ellison Hughs, M.D.  LOCATION:  [facility] [phone]   CC/HPI: Right flank pain   Mr. Phillip Powell is a 74 year old male with a history of kidney stones. He was seen in the emergency department on 09/19/2019 with right-sided flank pain that he states has been progressively worsening since crit January. CT stone study performed at that time revealed a 4 mm proximal right UPJ calculus with mild hydronephrosis along with a 1 mm nonobstructing right parenchymal stone. The patient was recently admitted for acute renal failure and was treated with IV fluid resuscitation. Urology was not consulted during this admission. His creatinine over the past several days has been in the 1.89-2.25 range. His baseline creatinine is in the 1.2 range. Renal ultrasound performed during his recent admission revealed persistent mild right-sided hydronephrosis. Today, the patient continues to have intermittent episodes of sharp right-sided flank pain, but denies nausea/vomiting, fever/chills, dysuria or hematuria. He does have a history of kidney stones and states that he passed 1 last year without the need for surgical intervention.     ALLERGIES: No Allergies    MEDICATIONS: Aspirin 81 mg tablet,chewable  Tamsulosin Hcl 0.4 mg capsule  Amlodipine Besylate 10 mg tablet  Atenolol 100 mg tablet  Famotidine 40 mg tablet  Glyburide 5 mg tablet  Linagliptin  Magnesium Oxide 500 mg capsule  Meclizine Hcl 25 mg tablet  Ordansetron     GU PSH: Locm 300-399Mg /Ml Iodine,1Ml - 2018       PSH Notes: Inguinal Hernia Repair, Knee Surgery, Biopsy Lung Percutaneous, Shoulder Surgery, Complete Colonoscopy (Fiberoptic)   NON-GU PSH: Appendectomy Biopsy Lung Or Mediastinum - 2008,  1991 Diagnostic Colonoscopy - 2008 Inguinal Hernia Repair > 5 yrs - 1995 Knee Arthroscopy/surgery, Left - 1996 Remove Tonsils Shoulder Arthroscopy/surgery, Bilateral     GU PMH: Benign Neo Kidney, Unspec - 2018, - 2018 Ureteral calculus - 2018, - 2018 Microscopic hematuria - 2018 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, 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, Erectile dysfunction due to arterial insufficiency - 2014 Primary hypogonadism, Hypogonadism, testicular - 2014 Acute kidney failure      PMH Notes:  1898-07-24 00:00:00 - Note: Normal Routine History And Physical Adult  2006-07-23 14:26:27 - Note: Arthritis   NON-GU PMH: Decreased libido, Decreased libido - 2014 Personal history of other diseases of the circulatory system, History of hypertension - 2014 Personal history of other diseases of the digestive system, History of esophageal reflux - 2014 Personal history of other endocrine, nutritional and metabolic disease, History of hypercholesterolemia - 2014, History of diabetes mellitus, - 2014 Arthritis Diabetes Type 2 GERD Gout Hypercholesterolemia Hypertension Sleep Apnea    FAMILY HISTORY: 1 Daughter - No Family History 1 son - No Family History Cancer - Father Diabetes - Mother Family Health Status Number - Runs In Family Gout - Father Heart Disease - Mother Hypertension - Mother Thyroid Disorder - Father   SOCIAL HISTORY: Marital Status: Married Preferred Language: English; Ethnicity: Not Hispanic Or Latino; Race: White Current Smoking Status: Patient has never smoked.   Tobacco Use Assessment Completed: Used Tobacco in last 30 days? Has never drank.  Drinks 1 caffeinated drink per day. Patient's occupation Animator.  Notes: Occupation:, Tobacco Use, Marital History - Currently Married, Caffeine Use, Death In The Family Mother, Alcohol Use, Death In The Family Father   REVIEW OF SYSTEMS:     GU Review Male:   Patient reports frequent urination, hard to postpone urination, and get up at night to urinate. Patient denies burning/ pain with urination, leakage of urine, stream starts and stops, trouble starting your stream, have to strain to urinate , erection problems, and penile pain.  Gastrointestinal (Upper):   Patient reports indigestion/ heartburn. Patient denies nausea and vomiting.  Gastrointestinal (Lower):   Patient reports diarrhea. Patient denies constipation.  Constitutional:   Patient reports fatigue. Patient denies fever, night sweats, and weight loss.  Skin:   Patient denies skin rash/ lesion and itching.  Eyes:   Patient reports blurred vision. Patient denies double vision.  Ears/ Nose/ Throat:   Patient denies sore throat and sinus problems.  Hematologic/Lymphatic:   Patient denies swollen glands and easy bruising.  Cardiovascular:   Patient denies leg swelling and chest pains.  Respiratory:   Patient reports cough and shortness of breath.   Endocrine:   Patient denies excessive thirst.  Musculoskeletal:   Patient reports back pain and joint pain.   Neurological:   Patient denies headaches and dizziness.  Psychologic:   Patient denies depression and anxiety.   VITAL SIGNS:      09/25/2019 02:40 PM  Weight 235 lb / 106.59 kg  Height 69 in / 175.26 cm  BP 158/88 mmHg  Heart Rate 87 /min  Temperature 98.0 F / 36.6 C  BMI 34.7 kg/m   GU PHYSICAL EXAMINATION:    Anus and Perineum: No hemorrhoids. No anal stenosis. No rectal fissure, no anal fissure. No edema, no dimple, no perineal tenderness, no anal tenderness.  Scrotum: No lesions. No edema. No cysts. No warts.  Epididymides: Right: no spermatocele, no masses, no cysts, no tenderness, no induration, no enlargement. Left: no spermatocele, no masses, no cysts, no tenderness, no induration, no enlargement.  Testes: No tenderness, no swelling, no enlargement left testes. No tenderness, no swelling, no enlargement  right testes. Normal location left testes. Normal location right testes. No mass, no cyst, no varicocele, no hydrocele left testes. No mass, no cyst, no varicocele, no hydrocele right testes.  Urethral Meatus: Normal size. No lesion, no wart, no discharge, no polyp. Normal location.  Penis: Circumcised, no warts, no cracks. No dorsal Peyronie's plaques, no left corporal Peyronie's plaques, no right corporal Peyronie's plaques, no scarring, no warts. No balanitis, no meatal stenosis.  Prostate: 40 gram or 2+ size. Left lobe normal consistency, right lobe normal consistency. Symmetrical lobes. No prostate nodule. Left lobe no tenderness, right lobe no tenderness.  Seminal Vesicles: Nonpalpable.  Sphincter Tone: Normal sphincter. No rectal tenderness. No rectal mass.    MULTI-SYSTEM PHYSICAL EXAMINATION:    Constitutional: Well-nourished. No physical deformities. Normally developed. Good grooming.  Neck: Neck symmetrical, not swollen. Normal tracheal position.  Respiratory: No labored breathing, no use of accessory muscles.   Cardiovascular: Normal temperature, normal extremity pulses, no swelling, no varicosities.  Lymphatic: No enlargement of neck, axillae, groin.  Skin: No paleness, no jaundice, no cyanosis. No lesion, no ulcer, no rash.  Neurologic / Psychiatric: Oriented to time, oriented to place, oriented to person. No depression, no anxiety, no agitation.  Gastrointestinal: No mass, no tenderness, no rigidity, non obese abdomen.  Eyes: Normal conjunctivae. Normal eyelids.  Ears, Nose, Mouth, and Throat: Left ear no scars, no lesions, no masses.  Right ear no scars, no lesions, no masses. Nose no scars, no lesions, no masses. Normal hearing. Normal lips.  Musculoskeletal: Normal gait and station of head and neck.     PAST DATA REVIEWED:  Source Of History:  Patient  Records Review:   Previous Patient Records  X-Ray Review: Outside Ultrasound: Reviewed Films. Reviewed Report. Discussed With  Patient.  C.T. Abdomen/Pelvis: Reviewed Films. Reviewed Report. Discussed With Patient.     05/08/08 10/28/07 07/19/06  PSA  Total PSA 0.44  0.56  0.38     07/19/06  Hormones  Testosterone, Total 2.07     PROCEDURES:          Urinalysis Dipstick Dipstick Cont'd  Color: Yellow Bilirubin: Neg mg/dL  Appearance: Clear Ketones: Neg mg/dL  Specific Gravity: 1.025 Blood: Neg ery/uL  pH: <=5.0 Protein: Trace mg/dL  Glucose: 3+ mg/dL Urobilinogen: 0.2 mg/dL    Nitrites: Neg    Leukocyte Esterase: Neg leu/uL    ASSESSMENT:      ICD-10 Details  1 GU:   Ureteral calculus - N20.1 Acute, Systemic Symptoms  2   Ureteral obstruction secondary to calculous - N13.2 Acute, Systemic Symptoms   PLAN:           Schedule Return Visit/Planned Activity: ASAP - Schedule Surgery          Document Letter(s):  Created for Patient: Clinical Summary         Notes:   The risks, benefits and alternatives of cystoscopy with RIGHT ureteroscopy, laser lithotripsy and ureteral stent placement was discussed the patient. Risks included, but are not limited to: bleeding, urinary tract infection, ureteral injury/avulsion, ureteral stricture formation, retained stone fragments, the possibility that multiple surgeries may be required to treat the stone(s), MI, stroke, PE and the inherent risks of general anesthesia. The patient voices understanding and wishes to proceed.

## 2019-10-01 NOTE — Anesthesia Preprocedure Evaluation (Addendum)
Anesthesia Evaluation  Patient identified by MRN, date of birth, ID band Patient awake    Reviewed: Allergy & Precautions, NPO status , Patient's Chart, lab work & pertinent test results  History of Anesthesia Complications (+) PONV and history of anesthetic complications  Airway Mallampati: III  TM Distance: >3 FB Neck ROM: Full    Dental  (+) Dental Advisory Given   Pulmonary neg shortness of breath, sleep apnea and Continuous Positive Airway Pressure Ventilation , neg recent URI,    breath sounds clear to auscultation       Cardiovascular hypertension, Pt. on medications (-) angina(-) Past MI and (-) CHF (-) dysrhythmias  Rhythm:Regular     Neuro/Psych  Headaches, neg Seizures negative psych ROS   GI/Hepatic GERD  Medicated and Controlled,Hepatic steatosis   Endo/Other  diabetesMorbid obesity  Renal/GU Renal InsufficiencyRenal disease     Musculoskeletal   Abdominal   Peds  Hematology negative hematology ROS (+)   Anesthesia Other Findings   Reproductive/Obstetrics                            Anesthesia Physical Anesthesia Plan  ASA: III  Anesthesia Plan: General   Post-op Pain Management:    Induction: Intravenous  PONV Risk Score and Plan: 3 and Ondansetron, Dexamethasone, TIVA and Propofol infusion  Airway Management Planned: LMA  Additional Equipment: None  Intra-op Plan:   Post-operative Plan: Extubation in OR  Informed Consent: I have reviewed the patients History and Physical, chart, labs and discussed the procedure including the risks, benefits and alternatives for the proposed anesthesia with the patient or authorized representative who has indicated his/her understanding and acceptance.     Dental advisory given  Plan Discussed with: CRNA and Surgeon  Anesthesia Plan Comments:         Anesthesia Quick Evaluation

## 2019-10-01 NOTE — Transfer of Care (Signed)
Immediate Anesthesia Transfer of Care Note  Patient: Phillip Powell  Procedure(s) Performed: CYSTOSCOPY/RETROGRADE/URETEROSCOPY/HOLMIUM LASER/STENT PLACEMENT (Right )  Patient Location: PACU  Anesthesia Type:General  Level of Consciousness: sedated  Airway & Oxygen Therapy: Patient Spontanous Breathing and Patient connected to face mask oxygen  Post-op Assessment: Stable  Post vital signs: Reviewed and stable  Last Vitals:  Vitals Value Taken Time  BP    Temp    Pulse    Resp    SpO2      Last Pain:  Vitals:   10/01/19 1115  TempSrc:   PainSc: 0-No pain         Complications: No apparent anesthesia complications

## 2019-10-01 NOTE — Op Note (Signed)
Operative Note  Preoperative diagnosis:  1.  4 mm right UPJ calculus  Postoperative diagnosis: 1.  4 mm right UPJ calculus  Procedure(s): 1.  Cystoscopy with right ureteroscopy, holmium laser lithotripsy and right JJ stent placement 2.  Right retrograde pyelogram with intraoperative interpretation of fluoroscopic imaging  Surgeon: Ellison Hughs, MD  Assistants:  None  Anesthesia:  General  Complications:  None  EBL: Less than 5 mL  Specimens: 1.  None  Drains/Catheters: 1.  Right 6 French, 24 cm JJ stent without tether  Intraoperative findings:   1. Right retrograde pyelogram revealed dilation of the proximal aspects of the ureter with a filling defect, consistent with the stone seen on recent cross-sectional imaging.  There were no other filling defects seen within the distal aspects of the ureter or within the right renal pelvis. 2. His right UPJ stone migrated into the renal pelvis following wire manipulation  Indication:  Phillip Powell is a 74 y.o. male with a 4 mm right UPJ calculus seen on CT from 09/19/2019.  He has been consented for the above procedures, voices understanding and wishes to proceed.  Description of procedure:  After informed consent was obtained, the patient was brought to the operating room and general LMA anesthesia was administered. The patient was then placed in the dorsolithotomy position and prepped and draped in the usual sterile fashion. A timeout was performed. A 23 French rigid cystoscope was then inserted into the urethral meatus and advanced into the bladder under direct vision. A complete bladder survey revealed no intravesical pathology.  A 5 French ureteral catheter was then inserted into the right ureteral orifice and a retrograde pyelogram was obtained, with the findings listed above.  A Glidewire was then used to intubate the lumen of the ureteral catheter and was advanced up to the right renal pelvis, under fluoroscopic guidance.   The catheter was then removed, leaving the wire in place.  A flexible ureteroscope was then advanced alongside the wire and up to the level of the right UPJ.  There was no stone within the right UPJ.  An inspection of the renal pelvis revealed a cluster of small stones in a midpole calyx measuring approximately 2 to 3 mm each.  A 200 m holmium laser was then used to fracture the stones into numerous smaller pieces.  No other stones or lesions were noted within the right renal pelvis or its associated calyces.  The flexible ureteroscope was then removed under direct vision with no evidence of ureteral trauma.  A 6 French, 24 cm JJ stent was then placed over the wire and into good position within the right collecting system, confirming placement via fluoroscopy.  The patient's bladder was then drained.  He tolerated the procedure well and was transferred to the postanesthesia in stable condition.  Plan: Follow-up on 10/08/2019 for office cystoscopy, stent removal and repeat BMP.

## 2019-10-01 NOTE — Anesthesia Procedure Notes (Signed)
Procedure Name: LMA Insertion Date/Time: 10/01/2019 1:36 PM Performed by: Talbot Grumbling, CRNA Pre-anesthesia Checklist: Patient identified, Emergency Drugs available, Suction available and Patient being monitored Patient Re-evaluated:Patient Re-evaluated prior to induction Oxygen Delivery Method: Circle system utilized Preoxygenation: Pre-oxygenation with 100% oxygen Induction Type: IV induction Ventilation: Mask ventilation without difficulty LMA: LMA inserted LMA Size: 4.0 Number of attempts: 1 Placement Confirmation: positive ETCO2 and breath sounds checked- equal and bilateral Tube secured with: Tape Dental Injury: Teeth and Oropharynx as per pre-operative assessment

## 2019-10-02 ENCOUNTER — Encounter: Payer: Self-pay | Admitting: *Deleted

## 2019-10-03 NOTE — Anesthesia Postprocedure Evaluation (Signed)
Anesthesia Post Note  Patient: Phillip Powell  Procedure(s) Performed: CYSTOSCOPY/RETROGRADE/URETEROSCOPY/HOLMIUM LASER/STENT PLACEMENT (Right )     Patient location during evaluation: PACU Anesthesia Type: General Level of consciousness: awake and alert Pain management: pain level controlled Vital Signs Assessment: post-procedure vital signs reviewed and stable Respiratory status: spontaneous breathing, nonlabored ventilation, respiratory function stable and patient connected to nasal cannula oxygen Cardiovascular status: blood pressure returned to baseline and stable Postop Assessment: no apparent nausea or vomiting Anesthetic complications: no    Last Vitals:  Vitals:   10/01/19 1500 10/01/19 1530  BP: 136/74 138/70  Pulse: 61 64  Resp: 15 12  Temp: (!) 36.4 C   SpO2: 100% 95%    Last Pain:  Vitals:   10/02/19 0828  TempSrc:   PainSc: 0-No pain                 Phillip Powell Easter Schinke

## 2019-10-14 ENCOUNTER — Other Ambulatory Visit: Payer: Self-pay | Admitting: Nephrology

## 2019-10-14 ENCOUNTER — Other Ambulatory Visit: Payer: Self-pay | Admitting: Internal Medicine

## 2019-10-14 DIAGNOSIS — M159 Polyosteoarthritis, unspecified: Secondary | ICD-10-CM

## 2019-10-14 DIAGNOSIS — G894 Chronic pain syndrome: Secondary | ICD-10-CM

## 2019-10-14 DIAGNOSIS — N1832 Chronic kidney disease, stage 3b: Secondary | ICD-10-CM

## 2019-10-14 MED ORDER — GABAPENTIN 100 MG PO CAPS: ORAL_CAPSULE | ORAL | 0 refills | Status: DC

## 2019-10-15 ENCOUNTER — Other Ambulatory Visit: Payer: Self-pay | Admitting: Physician Assistant

## 2019-10-15 ENCOUNTER — Ambulatory Visit
Admission: RE | Admit: 2019-10-15 | Discharge: 2019-10-15 | Disposition: A | Discharge status: Home / Self Care | Payer: Managed Care, Other (non HMO) | Source: Ambulatory Visit | Attending: Nephrology | Admitting: Nephrology

## 2019-10-15 DIAGNOSIS — N1832 Chronic kidney disease, stage 3b: Secondary | ICD-10-CM

## 2019-10-17 ENCOUNTER — Encounter: Payer: Self-pay | Admitting: Internal Medicine

## 2019-10-17 NOTE — Progress Notes (Signed)
Hospital follow up  Assessment and Plan:  Swelling -     Ambulatory referral to [facility] Will cut back on norvasc to 5 mg and increase atenolol to 100 mg PM and 50 mg AM Compression socks Elevated legs Increase walking.   Chronic right-sided thoracic back pain -     DG Thoracic Spine W/Swimmers; Future -     Ambulatory referral to [facility] Was just taken off mobic 2nd AKI  likely worse from that, can do voltern gel/salon pas, home health PT sent in for patient since he is in wheelchair at this time and unable to walk due to pain. He has good sensation/strength, no red flags.   Essential hypertension -     CBC with Differential/Platelet -     COMPLETE METABOLIC PANEL WITH GFR -     TSH  CKD stage 3 due to type 2 diabetes mellitus (HCC) Continue medications  Polyarthralgia ? Migrating myalgia- will rule out autoimmune post COVID Was just taken off mobic 2nd AKI  likely worse from that, can do voltern gel/salon pas, home health PT sent in for patient since he is in wheelchair at this time and unable to walk due to pain. He has good sensation/strength, no red flags.  -     Uric acid -     Sedimentation rate -     ANA -     Anti-DNA antibody, double-stranded -     Rheumatoid factor -     C-reactive protein -     CK -     Anti-Smith antibody -     HLA-B27 antigen  Urine abnormality -     Urinalysis, Routine w reflex microscopic -     Urine Culture  Dyspnea on exertion -     Ambulatory referral to [facility] - had negative VQ scan, negative CXR, ? Need CT chest since did have COVID however likely more deconditioning, will do PT     Over 40 minutes of exam, counseling, chart review, and complex, high/moderate level critical decision making was performed this visit.   Future Appointments  Date Time Provider [place]  12/09/2019 10:00 AM Unk Pinto, MD [facility] None     HPI 74 y.o. obese male with history of DM2, CKD, gout, chol, HTN, OSA,  kidney stones presents for follow up. He had a hospitlization  10/01/19, patient was discharged from the hospital on 10/01/19 for acute on CKD and kidney stone.   Patient has since seen urology, Dr. [name], had right ureteroscopy, laser lithotripsy and ureteral stent on 03/10 and had the stent removed 1 week later on the 17th. He saw nephrology, Dr. Posey Pronto on 03/16 and had renal US 10/16/19. He has been off mobic, metformin and losartan. He is on amlodipin and flomax. Still wearing CPAP at night.  He had a negative VQ scan, normal CXR 02/26  Patient has gone from walking with a cane to now being in a wheelchair, not able to stand for a weight. Wife states 1-2 weeks ago started with swelling bilateral hands, worse left hand, difficult to bend his fingers, has been off mobic and states had happened with that in the past. Wife states joints were red, warm. While the hands were improving, the left knee started to swell and be painful, no warmth, redness with that. Has been using CBD oil topically, patient states that knee needs to be replaced. Then he started to have bilateral feet swelling left worse than right and ankle discomfort.  He is having his right sided back pain, had improved but for last 2 days has been worse.   Back pain is worse with walking or standing. No pain down his legs, no incontinence, no numbness/tingling in his legs.  Never took fluid pill from hospital.  He was started on gabapentin 114m 1-2 tabs a day for pain.   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 His sugars has been better per wife, she has been changing his diet and he was put on Bcise, gyburide 5 mg WITH food only, stopped trajenta Friday due to muscle and joint pain and cost of the medciation. .  Wife states sugars in the are 120-90's, no low sugars, no symptoms of hypoglycemia and patient does have hypoglycemia awareness  Lab Results  Component Value Date   HGBA1C 10.4 (H) 09/22/2019   Blood pressure  136/84, pulse 85, temperature 97.7 F (36.5 C), SpO2 98 %.   Current Outpatient Medications (Endocrine & Metabolic):  .  glyBURIDE (DIABETA) 5 MG tablet, TAKE 1/2 TO 1 TABLET BY MOUTH THREE TIMES DAILY BEFORE MEALS AS DIRECTED FOR DIABETES (Patient taking differently: Take 2.5-5 mg by mouth See admin instructions. )  Current Outpatient Medications (Cardiovascular):  .  amLODipine (NORVASC) 10 MG tablet, Take 1 tablet (10 mg total) by mouth daily. .Marland Kitchen atenolol (TENORMIN) 100 MG tablet, Take 1 tablet Daily for BP (Patient taking differently: Take 100 mg by mouth daily. )   Current Outpatient Medications (Analgesics):  .  aspirin EC 81 MG tablet, Take 81 mg by mouth daily. .Marland Kitchen oxyCODONE (OXY IR/ROXICODONE) 5 MG immediate release tablet, Take 1 tablet (5 mg total) by mouth every 4 (four) hours as needed for moderate pain or severe pain.   Current Outpatient Medications (Other):  .  Blood Glucose Monitoring Suppl (ONETOUCH VERIO) w/Device KIT, Test blood sugar once daily. .  Blood Glucose Monitoring Suppl DEVI, Use meter three times a day to check blood sugar .  Cholecalciferol (VITAMIN D3) 2000 units TABS, Take 8,000 Units by mouth daily. .  famotidine (PEPCID) 40 MG tablet, Take 40 mg by mouth at bedtime. .  gabapentin (NEURONTIN) 100 MG capsule, Take 1 to 2 capsules 2 to 3 x  /day as needed for Chronic Pain .  glucose blood (ONETOUCH VERIO) test strip, Test blood sugar once daily .  glucose blood test strip, Test blood sugar three times a day .  Lancets (ONETOUCH ULTRASOFT) lancets, Test blood sugar once daily .  magnesium oxide (MAG-OX) 400 (241.3 Mg) MG tablet, Take 1 tablet (400 mg total) by mouth daily. .  meclizine (ANTIVERT) 25 MG tablet, Take 25 mg by mouth 3 (three) times daily as needed for dizziness. .  methocarbamol (ROBAXIN) 500 MG tablet, Take 1 or 2 tablets 4 x /day for Muscle Spasm (Patient taking differently: Take 500-1,000 mg by mouth 4 (four) times daily. Has not been  taking) .  ondansetron (ZOFRAN) 8 MG tablet, Take 1/2 to 1 tablet 3 x day if needed for Nausea (Patient taking differently: Take 4-8 mg by mouth 3 (three) times daily as needed for nausea. ) .  oxybutynin (DITROPAN) 5 MG tablet, Take 1 tablet (5 mg total) by mouth every 8 (eight) hours as needed for bladder spasms. .  phenazopyridine (PYRIDIUM) 200 MG tablet, Take 1 tablet (200 mg total) by mouth 3 (three) times daily as needed (for pain with urination). .  tamsulosin (FLOMAX) 0.4 MG CAPS capsule, Take 1 tablet Daily for  Prostate (Patient taking differently: Take 0.4 mg by mouth daily. ) .  vitamin C (ASCORBIC ACID) 500 MG tablet, Take 500 mg by mouth 2 (two) times daily. .  B-D 3CC LUER-LOK SYR 21GX1" 21G X 1" 3 ML MISC, use as directed  Past Medical History:  Diagnosis Date  .  Covid-19 Positive (02/06/2019)  02/11/2019  . BPH (benign prostatic hypertrophy)   . Diabetes mellitus without complication ([place])   . GERD (gastroesophageal reflux disease)   . Headache    PMH  . Hepatic steatosis   . History of kidney stones   . Hydroureteronephrosis   . Hypertension   . Hypogonadism male   . Mass    left shoulder AC joint  . OSA (obstructive sleep apnea)    wears CPAP  . PONV (postoperative nausea and vomiting)   . Renal calculi   . Sarcoidosis   . Vitamin D deficiency      Allergies  Allergen Reactions  . Ace Inhibitors Other (See Comments)    Unknown  . Ibuprofen Other (See Comments)    GI upset  . Invokana [Canagliflozin] Other (See Comments)    Unknown  . Metformin And Related Diarrhea  . Quinidine Other (See Comments)    Unknown    ROS: all negative except above.   Physical Exam: There were no vitals filed for this visit. BP 136/84   Pulse 85   Temp 97.7 F (36.5 C)   SpO2 98%  General Appearance: Well nourished, obese male in no apparent distress, appears uncomfortable. Eyes: PERRLA, conjunctiva no swelling or erythema ENT/Mouth: Ext aud canals clear, TMs without  erythema, bulging. Mask in place; oral exam. Hearing normal.   Neck: Supple, thyroid normal.  Respiratory: Respiratory effort normal, BS equal bilaterally, decreased breath sounds without rales, rhonchi, wheezing or stridor.  Cardio: RRR with no MRGs. Brisk peripheral pulses with 2+ edema bilateral legs with stasis dermatitis.  Abdomen: Soft, obese + BS.  Non tender, no guarding, rebound, hernias, masses. Lymphatics: Non tender without lymphadenopathy.  Musculoskeletal: Patient is in wheelchair this visit due to pain.  Gait is  Antalgic. Straight leg raising with dorsiflexion negative bilaterally for radicular symptoms. Strength is normal and symmetric in arms and legs. There is not SI tenderness to palpation.  There is paraspinal muscle spasm on the right, more thoracic back without rash.   There is not midline tenderness.  ROM of spine with  limited in extension due to pain. Some swelling bilateral MCPs without erythema, warmth.  Skin: Warm, dry without rashes, lesions, ecchymosis.  Neuro: Normal muscle tone, Sensation intact.  Psych: Awake and oriented X 3, normal affect, Insight and Judgment appropriate.     Vicie Mutters, PA-C 11:30 AM [facility]

## 2019-10-21 ENCOUNTER — Other Ambulatory Visit: Payer: Self-pay

## 2019-10-21 ENCOUNTER — Ambulatory Visit: Payer: Managed Care, Other (non HMO) | Admitting: Physician Assistant

## 2019-10-21 ENCOUNTER — Ambulatory Visit
Admission: RE | Admit: 2019-10-21 | Discharge: 2019-10-21 | Disposition: A | Discharge status: Home / Self Care | Payer: Managed Care, Other (non HMO) | Source: Ambulatory Visit | Attending: Physician Assistant | Admitting: Physician Assistant

## 2019-10-21 ENCOUNTER — Encounter: Payer: Self-pay | Admitting: Physician Assistant

## 2019-10-21 VITALS — BP 136/84 | HR 85 | Temp 97.7°F

## 2019-10-21 DIAGNOSIS — N183 Chronic kidney disease, stage 3 unspecified: Secondary | ICD-10-CM

## 2019-10-21 DIAGNOSIS — R609 Edema, unspecified: Secondary | ICD-10-CM | POA: Diagnosis not present

## 2019-10-21 DIAGNOSIS — M546 Pain in thoracic spine: Secondary | ICD-10-CM

## 2019-10-21 DIAGNOSIS — G8929 Other chronic pain: Secondary | ICD-10-CM

## 2019-10-21 DIAGNOSIS — M2550 Pain in unspecified joint: Secondary | ICD-10-CM

## 2019-10-21 DIAGNOSIS — I10 Essential (primary) hypertension: Secondary | ICD-10-CM

## 2019-10-21 DIAGNOSIS — E1122 Type 2 diabetes mellitus with diabetic chronic kidney disease: Secondary | ICD-10-CM

## 2019-10-21 DIAGNOSIS — R8290 Unspecified abnormal findings in urine: Secondary | ICD-10-CM | POA: Diagnosis not present

## 2019-10-21 DIAGNOSIS — R0609 Other forms of dyspnea: Secondary | ICD-10-CM

## 2019-10-21 DIAGNOSIS — R0600 Dyspnea, unspecified: Secondary | ICD-10-CM

## 2019-10-21 NOTE — Patient Instructions (Addendum)
Cut the amlodipine in half Increase the atenolol 100mg  at night and 1/2 or 50 mg in the morning  And monitor BP Try to elevate your legs 20-30 mins up to 3 x a day Wear compression socks  Voltern gel over the counter- can use up to 4 x a day And can do salon pas patches for your back too  INFORMATION ABOUT YOUR XRAY  Can walk into [address] building for an Insurance account manager. They will have the order and take you back. You do not any paper work, I should get the result back today or tomorrow. This order is good for a year.  Can call [phone] to schedule an appointment if you wish.    VENOUS INSUFFICIENCY Our lower leg venous system is not the most reliable, the heart does NOT pump fluid up, there is a valve system.  The muscles of the leg squeeze and the blood moves up and a valve opens and close, then they squeeze, blood moves up and valves open and closes keeping the blood moving towards the heart.  Lots can go wrong with this valve system.  If someone is sitting or standing without movement, everyone will get swelling.  THINGS TO DO:  Do not stand or sit in one position for long periods of time. Do not sit with your legs crossed. Rest with your legs raised during the day.  Your legs have to be higher than your heart so that gravity will force the valves to open, so please really elevate your legs.   Wear elastic stockings or support hose. Do not wear other tight, encircling garments around the legs, pelvis, or waist.  [employer]  has a wide variety of well priced compression stockings. [place], [place] [id number] [id number] has a good cheap selection, I like the socks, they are not as hard to get on  Walk as much as possible to increase blood flow.  Raise the foot of your bed at night with 2-inch blocks.  SEEK MEDICAL CARE IF:   The skin around your ankle starts to break down.  You have pain, redness, tenderness, or hard swelling developing  in your leg over a vein.  You are uncomfortable due to leg pain.  If you ever have shortness of breath with exertion or chest pain go to the ER.

## 2019-10-22 ENCOUNTER — Other Ambulatory Visit: Payer: Self-pay | Admitting: Physician Assistant

## 2019-10-22 ENCOUNTER — Telehealth: Payer: Self-pay | Admitting: Physician Assistant

## 2019-10-22 ENCOUNTER — Ambulatory Visit
Admission: RE | Admit: 2019-10-22 | Discharge: 2019-10-22 | Disposition: A | Discharge status: Home / Self Care | Payer: Managed Care, Other (non HMO) | Source: Ambulatory Visit | Attending: Physician Assistant | Admitting: Physician Assistant

## 2019-10-22 DIAGNOSIS — M2550 Pain in unspecified joint: Secondary | ICD-10-CM

## 2019-10-22 DIAGNOSIS — R0609 Other forms of dyspnea: Secondary | ICD-10-CM

## 2019-10-22 DIAGNOSIS — R7982 Elevated C-reactive protein (CRP): Secondary | ICD-10-CM

## 2019-10-22 DIAGNOSIS — R0600 Dyspnea, unspecified: Secondary | ICD-10-CM

## 2019-10-22 MED ORDER — DOXYCYCLINE HYCLATE 100 MG PO CAPS: ORAL_CAPSULE | ORAL | 0 refills | Status: DC

## 2019-10-22 NOTE — Telephone Encounter (Signed)
Sent in doxy

## 2019-10-23 LAB — CBC WITH DIFFERENTIAL/PLATELET
Absolute Monocytes: 826 cells/uL (ref 200–950)
Basophils Absolute: 71 cells/uL (ref 0–200)
Basophils Relative: 0.6 %
Eosinophils Absolute: 83 cells/uL (ref 15–500)
Eosinophils Relative: 0.7 %
HCT: 41.1 % (ref 38.5–50.0)
Hemoglobin: 13.3 g/dL (ref 13.2–17.1)
Lymphs Abs: 2089 cells/uL (ref 850–3900)
MCH: 26.4 pg — ABNORMAL LOW (ref 27.0–33.0)
MCHC: 32.4 g/dL (ref 32.0–36.0)
MCV: 81.7 fL (ref 80.0–100.0)
MPV: 9.4 fL (ref 7.5–12.5)
Monocytes Relative: 7 %
Neutro Abs: 8732 cells/uL — ABNORMAL HIGH (ref 1500–7800)
Neutrophils Relative %: 74 %
Platelets: 343 10*3/uL (ref 140–400)
RBC: 5.03 10*6/uL (ref 4.20–5.80)
RDW: 13.3 % (ref 11.0–15.0)
Total Lymphocyte: 17.7 %
WBC: 11.8 10*3/uL — ABNORMAL HIGH (ref 3.8–10.8)

## 2019-10-23 LAB — CK: Total CK: 24 U/L — ABNORMAL LOW (ref 44–196)

## 2019-10-23 LAB — URINE CULTURE
MICRO NUMBER:: 10308952
Result:: NO GROWTH
SPECIMEN QUALITY:: ADEQUATE

## 2019-10-23 LAB — COMPLETE METABOLIC PANEL WITH GFR
AG Ratio: 1.3 (calc) (ref 1.0–2.5)
ALT: 27 U/L (ref 9–46)
AST: 21 U/L (ref 10–35)
Albumin: 3.7 g/dL (ref 3.6–5.1)
Alkaline phosphatase (APISO): 78 U/L (ref 35–144)
BUN: 22 mg/dL (ref 7–25)
CO2: 28 mmol/L (ref 20–32)
Calcium: 9.6 mg/dL (ref 8.6–10.3)
Chloride: 97 mmol/L — ABNORMAL LOW (ref 98–110)
Creat: 0.83 mg/dL (ref 0.70–1.18)
GFR, Est African American: 102 mL/min/{1.73_m2} (ref >=60)
GFR, Est Non African American: 88 mL/min/{1.73_m2} (ref >=60)
Globulin: 2.9 g/dL (calc) (ref 1.9–3.7)
Glucose, Bld: 178 mg/dL — ABNORMAL HIGH (ref 65–99)
Potassium: 4.3 mmol/L (ref 3.5–5.3)
Sodium: 137 mmol/L (ref 135–146)
Total Bilirubin: 0.6 mg/dL (ref 0.2–1.2)
Total Protein: 6.6 g/dL (ref 6.1–8.1)

## 2019-10-23 LAB — HLA-B27 ANTIGEN: HLA-B27 Antigen: NEGATIVE

## 2019-10-23 LAB — URIC ACID: Uric Acid, Serum: 4.6 mg/dL (ref 4.0–8.0)

## 2019-10-23 LAB — RHEUMATOID FACTOR: Rheumatoid fact SerPl-aCnc: <14 IU/mL (ref <14)

## 2019-10-23 LAB — URINALYSIS, ROUTINE W REFLEX MICROSCOPIC
Bilirubin Urine: NEGATIVE
Glucose, UA: NEGATIVE
Hgb urine dipstick: NEGATIVE
Ketones, ur: NEGATIVE
Leukocytes,Ua: NEGATIVE
Nitrite: NEGATIVE
Protein, ur: NEGATIVE
Specific Gravity, Urine: 1.018 (ref 1.001–1.03)
pH: <=5 (ref 5.0–8.0)

## 2019-10-23 LAB — C-REACTIVE PROTEIN: CRP: 158.1 mg/L — ABNORMAL HIGH (ref <8.0)

## 2019-10-23 LAB — SEDIMENTATION RATE: Sed Rate: 103 mm/h — ABNORMAL HIGH (ref 0–20)

## 2019-10-23 LAB — TSH: TSH: 1.11 mIU/L (ref 0.40–4.50)

## 2019-10-23 LAB — ANTI-DNA ANTIBODY, DOUBLE-STRANDED: ds DNA Ab: <1 IU/mL

## 2019-10-23 LAB — ANTI-SMITH ANTIBODY: ENA SM Ab Ser-aCnc: <1 AI

## 2019-10-23 LAB — ANA: Anti Nuclear Antibody (ANA): NEGATIVE

## 2019-10-24 ENCOUNTER — Other Ambulatory Visit: Payer: Self-pay | Admitting: Internal Medicine

## 2019-10-24 DIAGNOSIS — R079 Chest pain, unspecified: Secondary | ICD-10-CM

## 2019-10-24 MED ORDER — DEXAMETHASONE 2 MG PO TABS: ORAL_TABLET | ORAL | 0 refills | Status: DC

## 2019-10-24 NOTE — Progress Notes (Unsigned)
   Wife called   Patient having positional sharp pains betw shoulder blades as he shifts positions sitting. Notconstant & not related to activity  CXR Nl 2 days ago. No Dyspnea. O2 sat 93%. No Tachypnea. No Chest congestion. Glucose 158 this am (usu in the 130's)  Rheumatology appt pending.  Discussed with wife - sent Rx Decadron 2 mg - to try 3 x /today only and continue to monitor  CBG's.   Wife will call if any changes in condition

## 2019-10-26 ENCOUNTER — Other Ambulatory Visit: Payer: Self-pay | Admitting: Internal Medicine

## 2019-10-26 MED ORDER — JANUMET 50-500 MG PO TABS: ORAL_TABLET | ORAL | 1 refills | Status: DC

## 2019-11-10 DIAGNOSIS — N182 Chronic kidney disease, stage 2 (mild): Secondary | ICD-10-CM

## 2019-11-10 DIAGNOSIS — E1122 Type 2 diabetes mellitus with diabetic chronic kidney disease: Secondary | ICD-10-CM

## 2019-11-10 MED ORDER — ONETOUCH VERIO VI STRP: ORAL_STRIP | 12 refills | Status: DC

## 2019-11-14 ENCOUNTER — Encounter: Payer: Managed Care, Other (non HMO) | Admitting: Internal Medicine

## 2019-11-17 ENCOUNTER — Encounter: Payer: Managed Care, Other (non HMO) | Admitting: Internal Medicine

## 2019-11-18 NOTE — Progress Notes (Signed)
Follow up to return to work- can go back the 5th  Assessment and Plan:  Type 2 diabetes mellitus with stage 2 chronic kidney disease, without long-term current use of insulin (HCC) -     Hemoglobin A1c -     Fructosamine Discussed general issues about diabetes pathophysiology and management., Educational material distributed., Suggested low cholesterol diet., Encouraged aerobic exercise., Discussed foot care., Reminded to get yearly retinal exam.  Elevated C-reactive protein (CRP) -     Sedimentation rate -     C-reactive protein  Polyarthralgia -     Sedimentation rate -     C-reactive protein Have improved and patient wants to go back to work  CKD stage 3 due to type 2 diabetes mellitus ([place]) -     CBC with Differential/Platelet -     COMPLETE METABOLIC PANEL WITH GFR -     TSH -     Hemoglobin A1c -     Fructosamine Added on PTH, mag, and vitamin D for Dr. Posey Pronto will send to him Was too high for CDL last visit, will recheck, he is on better meds, him and his wife have been very diligent with diet, will recheck  Essential hypertension -     amLODipine (NORVASC) 5 MG tablet; Take 1 tablet (5 mg total) by mouth daily. - cut back to 1/2 pill a day to cut back on swelling  BPH with obstruction/lower urinary tract symptoms -     PSA   Release back to work the 5th  Over 30 minutes of exam, counseling, chart review, and complex, high/moderate level critical decision making was performed this visit.   Future Appointments  Date Time Provider Department Center  12/09/2019 10:00 AM Unk Pinto, MD [facility] None     HPI 74 y.o. obese male with history of DM2, CKD improved, gout, chol, HTN, OSA, kidney stone s/p right ureteroscopy, laser lithotripsy and ureteral stent on 03/10 with Dr. [name], reactive OA with elevated CRP/ESR, following with Dr. Kathlene November.  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    Patient had migratory arthralgias with elevated CRP/ESR, following with Dr. Megan Salon. Given prednisone that he has completed and is now on a very low dose of Leflunomide 10 mg daily- will monitor liver function.     The patient is diabetic and has his CDL. His last A1C was too high for him to start driving again. He is on Bcise once a week, he is back on  on Janumet 50/500, He is taking glyburide 5 mg a whole pill if it is 180 and 1/2 if it is above 150, has not taken it lately. Sugar was 108 this AM.  Wife states sugars lowest sugar was 81 and on the 14th woke up with sugar at 54, and patient does have hypoglycemia awareness. He is checking his sugar 2-3 x a day and his wife has been working very hard on diet and doing well.   \ He is on amlodipine and flomax. Still wearing CPAP at night.  He had a negative VQ scan, normal CXR 02/26  Lab Results  Component Value Date   HGBA1C 10.4 (H) 09/22/2019   Lab Results  Component Value Date   GFRNONAA 88 10/21/2019   Blood pressure 126/70, pulse 98, temperature (!) 97.3 F (36.3 C), weight 216 lb (98 kg), SpO2 97 %.  BMI is Body mass index is 32.84 kg/m., he  is working on diet and exercise. Wt Readings from Last 3 Encounters:  11/19/19 216 lb (98 kg)  10/01/19 222 lb 0.1 oz (100.7 kg)  09/30/19 222 lb (100.7 kg)     Current Outpatient Medications (Endocrine & Metabolic):  .  dexamethasone (DECADRON) 2 MG tablet, Take 1 tab 3 x day - 3 days, then 2 x day - 3 days, then 1 tab daily .  glyBURIDE (DIABETA) 5 MG tablet, TAKE 1/2 TO 1 TABLET BY MOUTH THREE TIMES DAILY BEFORE MEALS AS DIRECTED FOR DIABETES (Patient taking differently: Take 2.5-5 mg by mouth See admin instructions. ) .  sitaGLIPtin-metformin (JANUMET) 50-500 MG tablet, Take 1 tablet 2 x  /day with Meals for Diabetes  Current Outpatient Medications (Cardiovascular):  .  amLODipine (NORVASC) 10 MG tablet, Take 1 tablet (10 mg total) by mouth daily. Marland Kitchen  atenolol (TENORMIN) 100 MG  tablet, Take 1 tablet Daily for BP (Patient taking differently: Take 100 mg by mouth daily. )   Current Outpatient Medications (Analgesics):  .  aspirin EC 81 MG tablet, Take 81 mg by mouth daily. Marland Kitchen  oxyCODONE (OXY IR/ROXICODONE) 5 MG immediate release tablet, Take 1 tablet (5 mg total) by mouth every 4 (four) hours as needed for moderate pain or severe pain.   Current Outpatient Medications (Other):  .  Blood Glucose Monitoring Suppl (ONETOUCH VERIO) w/Device KIT, Test blood sugar once daily. .  Blood Glucose Monitoring Suppl DEVI, Use meter three times a day to check blood sugar .  Cholecalciferol (VITAMIN D3) 2000 units TABS, Take 8,000 Units by mouth daily. Marland Kitchen  doxycycline (VIBRAMYCIN) 100 MG capsule, Take 1 capsule twice daily with food .  famotidine (PEPCID) 40 MG tablet, Take 40 mg by mouth at bedtime. .  gabapentin (NEURONTIN) 100 MG capsule, Take 1 to 2 capsules 2 to 3 x  /day as needed for Chronic Pain .  glucose blood (ONETOUCH VERIO) test strip, Test sugars 3 x a day for hypoglycemia/hyperglycemia and fluctuating sugars .  glucose blood test strip, Test blood sugar three times a day .  Lancets (ONETOUCH ULTRASOFT) lancets, Test blood sugar once daily .  magnesium oxide (MAG-OX) 400 (241.3 Mg) MG tablet, Take 1 tablet (400 mg total) by mouth daily. .  meclizine (ANTIVERT) 25 MG tablet, Take 25 mg by mouth 3 (three) times daily as needed for dizziness. .  methocarbamol (ROBAXIN) 500 MG tablet, Take 1 or 2 tablets 4 x /day for Muscle Spasm (Patient taking differently: Take 500-1,000 mg by mouth 4 (four) times daily. Has not been taking) .  ondansetron (ZOFRAN) 8 MG tablet, Take 1/2 to 1 tablet 3 x day if needed for Nausea (Patient taking differently: Take 4-8 mg by mouth 3 (three) times daily as needed for nausea. ) .  oxybutynin (DITROPAN) 5 MG tablet, Take 1 tablet (5 mg total) by mouth every 8 (eight) hours as needed for bladder spasms. .  phenazopyridine (PYRIDIUM) 200 MG tablet,  Take 1 tablet (200 mg total) by mouth 3 (three) times daily as needed (for pain with urination). .  tamsulosin (FLOMAX) 0.4 MG CAPS capsule, Take 1 tablet Daily for Prostate (Patient taking differently: Take 0.4 mg by mouth daily. ) .  vitamin C (ASCORBIC ACID) 500 MG tablet, Take 500 mg by mouth 2 (two) times daily. .  B-D 3CC LUER-LOK SYR 21GX1" 21G X 1" 3 ML MISC, use as directed  Past Medical History:  Diagnosis Date  .  Covid-19 Positive (02/06/2019)  02/11/2019  .  BPH (benign prostatic hypertrophy)   . Diabetes mellitus without complication ([place])   . GERD (gastroesophageal reflux disease)   . Headache    PMH  . Hepatic steatosis   . History of kidney stones   . Hydroureteronephrosis   . Hypertension   . Hypogonadism male   . Mass    left shoulder AC joint  . OSA (obstructive sleep apnea)    wears CPAP  . PONV (postoperative nausea and vomiting)   . Renal calculi   . Sarcoidosis   . Vitamin D deficiency      Allergies  Allergen Reactions  . Ace Inhibitors Other (See Comments)    Unknown  . Ibuprofen Other (See Comments)    GI upset  . Invokana [Canagliflozin] Other (See Comments)    Unknown  . Metformin And Related Diarrhea  . Quinidine Other (See Comments)    Unknown    ROS: all negative except above.   Physical Exam: Filed Weights   11/19/19 1050  Weight: 216 lb (98 kg)   BP 126/70   Pulse 98   Temp (!) 97.3 F (36.3 C)   Wt 216 lb (98 kg)   SpO2 97%   BMI 32.84 kg/m  General Appearance: Well nourished, obese male in no apparent distress, appears uncomfortable. Eyes: PERRLA, conjunctiva no swelling or erythema ENT/Mouth: Ext aud canals clear, TMs without erythema, bulging. Mask in place; oral exam. Hearing normal.   Neck: Supple, thyroid normal.  Respiratory: Respiratory effort normal, BS equal bilaterally, decreased breath sounds without rales, rhonchi, wheezing or stridor.  Cardio: RRR with no MRGs. Brisk peripheral pulses with 1+ edema bilateral  legs with stasis dermatitis.  Abdomen: Soft, obese + BS.  Non tender, no guarding, rebound, hernias, masses. Lymphatics: Non tender without lymphadenopathy.  Musculoskeletal:  Gait is antalgic with a cane. Straight leg raising with dorsiflexion negative bilaterally for radicular symptoms. Strength is normal and symmetric in arms and legs. There is not SI tenderness to palpation.  Skin: Warm, dry without rashes, lesions, ecchymosis.  Neuro: Normal muscle tone, Sensation intact.  Psych: Awake and oriented X 3, normal affect, Insight and Judgment appropriate.     Vicie Mutters, PA-C 11:20 AM [facility]

## 2019-11-19 ENCOUNTER — Other Ambulatory Visit: Payer: Self-pay

## 2019-11-19 ENCOUNTER — Ambulatory Visit (INDEPENDENT_AMBULATORY_CARE_PROVIDER_SITE_OTHER): Payer: Managed Care, Other (non HMO) | Admitting: Physician Assistant

## 2019-11-19 ENCOUNTER — Other Ambulatory Visit: Payer: Self-pay | Admitting: Internal Medicine

## 2019-11-19 ENCOUNTER — Encounter: Payer: Self-pay | Admitting: Physician Assistant

## 2019-11-19 VITALS — BP 126/70 | HR 98 | Temp 97.3°F | Wt 216.0 lb

## 2019-11-19 DIAGNOSIS — N138 Other obstructive and reflux uropathy: Secondary | ICD-10-CM

## 2019-11-19 DIAGNOSIS — M2550 Pain in unspecified joint: Secondary | ICD-10-CM

## 2019-11-19 DIAGNOSIS — R7982 Elevated C-reactive protein (CRP): Secondary | ICD-10-CM

## 2019-11-19 DIAGNOSIS — E1122 Type 2 diabetes mellitus with diabetic chronic kidney disease: Secondary | ICD-10-CM

## 2019-11-19 DIAGNOSIS — I10 Essential (primary) hypertension: Secondary | ICD-10-CM | POA: Diagnosis not present

## 2019-11-19 DIAGNOSIS — N184 Chronic kidney disease, stage 4 (severe): Secondary | ICD-10-CM

## 2019-11-19 DIAGNOSIS — N401 Enlarged prostate with lower urinary tract symptoms: Secondary | ICD-10-CM

## 2019-11-19 DIAGNOSIS — Z79899 Other long term (current) drug therapy: Secondary | ICD-10-CM

## 2019-11-19 DIAGNOSIS — N182 Chronic kidney disease, stage 2 (mild): Secondary | ICD-10-CM

## 2019-11-19 DIAGNOSIS — N183 Chronic kidney disease, stage 3 unspecified: Secondary | ICD-10-CM

## 2019-11-19 DIAGNOSIS — E559 Vitamin D deficiency, unspecified: Secondary | ICD-10-CM

## 2019-11-19 MED ORDER — AMLODIPINE BESYLATE 5 MG PO TABS: 5.0000 mg | ORAL_TABLET | Freq: Every day | ORAL | 0 refills | Status: DC

## 2019-11-19 NOTE — Patient Instructions (Addendum)
Continue diabetic medications     Bad carbs also include fruit juice, alcohol, and sweet tea. These are empty calories that do not signal to your brain that you are full.   Please remember the good carbs are still carbs which convert into sugar. So please measure them out no more than 1/2-1 cup of rice, oatmeal, pasta, and beans  Veggies are however free foods! Pile them on.   Not all fruit is created equal. Please see the list below, the fruit at the bottom is higher in sugars than the fruit at the top. Please avoid all dried fruits.

## 2019-11-25 LAB — CBC WITH DIFFERENTIAL/PLATELET
Absolute Monocytes: 446 cells/uL (ref 200–950)
Basophils Absolute: 37 cells/uL (ref 0–200)
Basophils Relative: 0.6 %
Eosinophils Absolute: 161 cells/uL (ref 15–500)
Eosinophils Relative: 2.6 %
HCT: 39.1 % (ref 38.5–50.0)
Hemoglobin: 12.7 g/dL — ABNORMAL LOW (ref 13.2–17.1)
Lymphs Abs: 1829 cells/uL (ref 850–3900)
MCH: 25.9 pg — ABNORMAL LOW (ref 27.0–33.0)
MCHC: 32.5 g/dL (ref 32.0–36.0)
MCV: 79.8 fL — ABNORMAL LOW (ref 80.0–100.0)
MPV: 9.1 fL (ref 7.5–12.5)
Monocytes Relative: 7.2 %
Neutro Abs: 3726 cells/uL (ref 1500–7800)
Neutrophils Relative %: 60.1 %
Platelets: 215 10*3/uL (ref 140–400)
RBC: 4.9 10*6/uL (ref 4.20–5.80)
RDW: 15.1 % — ABNORMAL HIGH (ref 11.0–15.0)
Total Lymphocyte: 29.5 %
WBC: 6.2 10*3/uL (ref 3.8–10.8)

## 2019-11-25 LAB — SEDIMENTATION RATE

## 2019-11-25 LAB — COMPLETE METABOLIC PANEL WITH GFR
AG Ratio: 1.7 (calc) (ref 1.0–2.5)
ALT: 12 U/L (ref 9–46)
AST: 11 U/L (ref 10–35)
Albumin: 3.9 g/dL (ref 3.6–5.1)
Alkaline phosphatase (APISO): 53 U/L (ref 35–144)
BUN: 18 mg/dL (ref 7–25)
CO2: 27 mmol/L (ref 20–32)
Calcium: 9.6 mg/dL (ref 8.6–10.3)
Chloride: 100 mmol/L (ref 98–110)
Creat: 0.81 mg/dL (ref 0.70–1.18)
GFR, Est African American: 103 mL/min/{1.73_m2} (ref >=60)
GFR, Est Non African American: 89 mL/min/{1.73_m2} (ref >=60)
Globulin: 2.3 g/dL (calc) (ref 1.9–3.7)
Glucose, Bld: 121 mg/dL — ABNORMAL HIGH (ref 65–99)
Potassium: 4.3 mmol/L (ref 3.5–5.3)
Sodium: 137 mmol/L (ref 135–146)
Total Bilirubin: 0.5 mg/dL (ref 0.2–1.2)
Total Protein: 6.2 g/dL (ref 6.1–8.1)

## 2019-11-25 LAB — HEMOGLOBIN A1C
Hgb A1c MFr Bld: 8.2 % of total Hgb — ABNORMAL HIGH (ref <5.7)
Mean Plasma Glucose: 189 (calc)
eAG (mmol/L): 10.4 (calc)

## 2019-11-25 LAB — TSH: TSH: 1.32 mIU/L (ref 0.40–4.50)

## 2019-11-25 LAB — FRUCTOSAMINE: Fructosamine: 218 umol/L (ref 205–285)

## 2019-11-25 LAB — C-REACTIVE PROTEIN: CRP: 26.4 mg/L — ABNORMAL HIGH (ref <8.0)

## 2019-11-25 LAB — PSA: PSA: 0.4 ng/mL (ref <=4.0)

## 2019-11-26 ENCOUNTER — Other Ambulatory Visit: Payer: Self-pay | Admitting: Internal Medicine

## 2019-11-26 MED ORDER — FAMOTIDINE 40 MG PO TABS: ORAL_TABLET | ORAL | 0 refills | Status: DC

## 2019-12-09 ENCOUNTER — Encounter: Payer: Managed Care, Other (non HMO) | Admitting: Internal Medicine

## 2019-12-10 ENCOUNTER — Encounter: Payer: Managed Care, Other (non HMO) | Admitting: Physician Assistant

## 2019-12-10 ENCOUNTER — Other Ambulatory Visit: Payer: Self-pay | Admitting: Physician Assistant

## 2019-12-23 ENCOUNTER — Other Ambulatory Visit: Payer: Self-pay | Admitting: Internal Medicine

## 2020-02-16 ENCOUNTER — Other Ambulatory Visit: Payer: Self-pay | Admitting: Physician Assistant

## 2020-02-16 MED ORDER — ONDANSETRON HCL 8 MG PO TABS: ORAL_TABLET | ORAL | 1 refills | Status: DC

## 2020-02-19 NOTE — Progress Notes (Signed)
Assessment and Plan:  Type 2 diabetes mellitus with stage 2 chronic kidney disease, without long-term current use of insulin (HCC) -     Hemoglobin A1c - stop the janumet since he is on a GLP1 Discussed general issues about diabetes pathophysiology and management., Educational material distributed., Suggested low cholesterol diet., Encouraged aerobic exercise., Discussed foot care., Reminded to get yearly retinal exam.  Diarrhea Has improved No AB pain, benign Ab Check labs, stop metformin, stop magnesium, will switch bcise to trulicity If not better need stool samples- declines today ? From Leflunomide if not better  CKD stage 3 due to type 2 diabetes mellitus (HCC) -     CBC with Differential/Platelet -     COMPLETE METABOLIC PANEL WITH GFR -     TSH -     Hemoglobin A1c Was too high for CDL last visit, will recheck, he is on better meds, him and his wife have been very diligent with diet, will recheck  Essential hypertension - continue medications, DASH diet, exercise and monitor at home. Call if greater than 130/80.   Over 30 minutes of exam, counseling, chart review, and complex, high/moderate level critical decision making was performed this visit.   Future Appointments  Date Time Provider [place]  05/10/2020  9:00 AM Vicie Mutters, PA-C [facility] None  05/16/2021 10:00 AM Unk Pinto, MD [facility] None     HPI 74 y.o. obese male with history of DM2, CKD improved, gout, chol, HTN, OSA, kidney stone s/p right ureteroscopy, laser lithotripsy and ureteral stent on 03/10 with Dr. [name], reactive OA with elevated CRP/ESR, following with Dr. Kathlene November, here for 3 month follow up.  He is a Administrator with CDL.  States last week he was out of work for 3 days for diarrhea, chills, fever, went to [employer] 02/17/2020. Had negative COVID test and give zofran. Patient would like to change Bcise to trulicity, he did better.   Patient had migratory arthralgias with  elevated CRP/ESR, following with Dr. Megan Salon. Currenlty on Leflunomide 10 mg daily- will monitor liver function.     BMI is Body mass index is 30.71 kg/m., he is working on diet and exercise. Wt Readings from Last 3 Encounters:  02/23/20 202 lb (91.6 kg)  11/19/19 216 lb (98 kg)  10/01/19 222 lb 0.1 oz (100.7 kg)    The patient is diabetic and has his CDL.  He has hyperlipidemia He has CKD which improved with kidney stone removal and adjustment of medications.  He is on Bcise once a week he is back on  on Janumet 50/500 He is taking glyburide 5 mg a half pill at night occ if over 170, has had a few low sugars waking up with he take the glipzide lowest is 51.  Patient does have hypoglycemia awareness.  He is checking his sugar 2-3 x a day and his wife has been working very hard on diet and doing well.    He is on amlodipine and flomax. Still wearing CPAP at night.   Lab Results  Component Value Date   HGBA1C 8.2 (H) 11/19/2019   Lab Results  Component Value Date   GFRNONAA 89 11/19/2019    Current Outpatient Medications (Endocrine & Metabolic):    glyBURIDE (DIABETA) 5 MG tablet, TAKE 1/2 TO 1 TABLET BY MOUTH THREE TIMES DAILY BEFORE MEALS AS DIRECTED FOR DIABETES (Patient taking differently: Take 2.5-5 mg by mouth See admin instructions. )   sitaGLIPtin-metformin (JANUMET) 50-500 MG tablet, Take 1  tablet 2 x  /day with Meals for Diabetes  Current Outpatient Medications (Cardiovascular):    atenolol (TENORMIN) 100 MG tablet, Take 1 tablet Daily for BP (Patient taking differently: Take 100 mg by mouth daily. )   Current Outpatient Medications (Analgesics):    aspirin EC 81 MG tablet, Take 81 mg by mouth daily.   Current Outpatient Medications (Other):    B-D 3CC LUER-LOK SYR 21GX1" 21G X 1" 3 ML MISC, use as directed   Blood Glucose Monitoring Suppl (ONETOUCH VERIO) w/Device KIT, Test blood sugar once daily.   Blood Glucose Monitoring Suppl DEVI, Use meter three times a  day to check blood sugar   Cholecalciferol (VITAMIN D3) 2000 units TABS, Take 8,000 Units by mouth daily.   famotidine (PEPCID) 40 MG tablet, Take 1 tablet at Bedtime for Indigestion & Acid Reflux   gabapentin (NEURONTIN) 100 MG capsule, Take 1 to 2 capsules 2 to 3 x  /day as needed for Chronic Pain   glucose blood (ONETOUCH VERIO) test strip, Test sugars 3 x a day for hypoglycemia/hyperglycemia and fluctuating sugars   glucose blood test strip, Test blood sugar three times a day   Lancets (ONETOUCH ULTRASOFT) lancets, Test blood sugar once daily   magnesium oxide (MAG-OX) 400 (241.3 Mg) MG tablet, Take 1 tablet (400 mg total) by mouth daily.   meclizine (ANTIVERT) 25 MG tablet, Take 25 mg by mouth 3 (three) times daily as needed for dizziness.   methocarbamol (ROBAXIN) 500 MG tablet, Take 1 or 2 tablets 4 x /day for Muscle Spasm (Patient taking differently: Take 500-1,000 mg by mouth 4 (four) times daily. Has not been taking)   ondansetron (ZOFRAN) 8 MG tablet, Take 1/2 to 1 tablet 3 x day if needed for Nausea   oxybutynin (DITROPAN) 5 MG tablet, Take 1 tablet (5 mg total) by mouth every 8 (eight) hours as needed for bladder spasms.   phenazopyridine (PYRIDIUM) 200 MG tablet, Take 1 tablet (200 mg total) by mouth 3 (three) times daily as needed (for pain with urination).   tamsulosin (FLOMAX) 0.4 MG CAPS capsule, Take 1 tablet Daily for Prostate (Patient taking differently: Take 0.4 mg by mouth daily. )   vitamin C (ASCORBIC ACID) 500 MG tablet, Take 500 mg by mouth 2 (two) times daily.  Past Medical History:  Diagnosis Date    Covid-19 Positive (02/06/2019)  02/11/2019   BPH (benign prostatic hypertrophy)    Diabetes mellitus without complication (HCC)    GERD (gastroesophageal reflux disease)    Headache    PMH   Hepatic steatosis    History of kidney stones    Hydroureteronephrosis    Hypertension    Hypogonadism male    Mass    left shoulder AC joint   OSA  (obstructive sleep apnea)    wears CPAP   PONV (postoperative nausea and vomiting)    Renal calculi    Sarcoidosis    Vitamin D deficiency      Allergies  Allergen Reactions   Ace Inhibitors Other (See Comments)    Unknown   Ibuprofen Other (See Comments)    GI upset   Invokana [Canagliflozin] Other (See Comments)    Unknown   Metformin And Related Diarrhea   Quinidine Other (See Comments)    Unknown    ROS: all negative except above.   Physical Exam: Filed Weights   02/23/20 0930  Weight: 202 lb (91.6 kg)   BP (!) 138/82    Pulse 63  Temp (!) 97.4 F (36.3 C)    Wt 202 lb (91.6 kg)    SpO2 95%    BMI 30.71 kg/m  General Appearance: Well nourished, obese male in no apparent distress, appears uncomfortable. Eyes: PERRLA, conjunctiva no swelling or erythema ENT/Mouth: Ext aud canals clear, TMs without erythema, bulging. Mask in place; oral exam. Hearing normal.   Neck: Supple, thyroid normal.  Respiratory: Respiratory effort normal, BS equal bilaterally, decreased breath sounds without rales, rhonchi, wheezing or stridor.  Cardio: RRR with no MRGs. Brisk peripheral pulses with 1+ edema bilateral legs with stasis dermatitis.  Abdomen: Soft, obese + BS.  Non tender, no guarding, rebound, hernias, masses. Lymphatics: Non tender without lymphadenopathy.  Musculoskeletal:  Gait is antalgic with a cane. Straight leg raising with dorsiflexion negative bilaterally for radicular symptoms. Strength is normal and symmetric in arms and legs. There is not SI tenderness to palpation.  Skin: Warm, dry without rashes, lesions, ecchymosis.  Neuro: Normal muscle tone, Sensation intact.  Psych: Awake and oriented X 3, normal affect, Insight and Judgment appropriate.     Vicie Mutters, PA-C 9:58 AM [facility]

## 2020-02-23 ENCOUNTER — Other Ambulatory Visit: Payer: Self-pay

## 2020-02-23 ENCOUNTER — Ambulatory Visit: Payer: Managed Care, Other (non HMO) | Admitting: Physician Assistant

## 2020-02-23 ENCOUNTER — Encounter: Payer: Self-pay | Admitting: Physician Assistant

## 2020-02-23 VITALS — BP 138/82 | HR 63 | Temp 97.4°F | Wt 202.0 lb

## 2020-02-23 DIAGNOSIS — I10 Essential (primary) hypertension: Secondary | ICD-10-CM | POA: Diagnosis not present

## 2020-02-23 DIAGNOSIS — E785 Hyperlipidemia, unspecified: Secondary | ICD-10-CM

## 2020-02-23 DIAGNOSIS — E1122 Type 2 diabetes mellitus with diabetic chronic kidney disease: Secondary | ICD-10-CM | POA: Diagnosis not present

## 2020-02-23 DIAGNOSIS — D649 Anemia, unspecified: Secondary | ICD-10-CM

## 2020-02-23 DIAGNOSIS — Z79899 Other long term (current) drug therapy: Secondary | ICD-10-CM

## 2020-02-23 DIAGNOSIS — E559 Vitamin D deficiency, unspecified: Secondary | ICD-10-CM

## 2020-02-23 DIAGNOSIS — E1169 Type 2 diabetes mellitus with other specified complication: Secondary | ICD-10-CM

## 2020-02-23 DIAGNOSIS — N183 Chronic kidney disease, stage 3 unspecified: Secondary | ICD-10-CM

## 2020-02-23 MED ORDER — TRULICITY 1.5 MG/0.5ML ~~LOC~~ SOAJ
1.5000 mg | SUBCUTANEOUS | 1 refills | Status: DC
Start: 2020-02-23 — End: 2020-08-16

## 2020-02-23 NOTE — Patient Instructions (Addendum)
Stop the janumet Will switch Bcise to Trulicity  Only take the glipizide 1/2 pill if sugar is over 200 and with food.   Check to see if  Leflunomide could be the cause if changing meds does not help.   Ways to prevent diarrhea with magnesium:  1) Don't take all your magnesium at the same time, have 2-3 smaller doses through out the day 2) Try taking your magnesium with high fiber meals.  3) If this does not help, take the magnesium on an empty stomach. Fiber for some people can bind the magnesium too well and prevent absorption in your gut.  4) Lastly try different types of magnesium. Most people are taking magnesium citrate, you can also try dimalate capsules which are slow release. You can also find magnesium lotions/sprays for the skin that bypass the gut. Another one that has good absorption is ReMag (pico-iconic magnesium formula), this has great cellular absorption so less of a laxative effect. You can find these type at health food stores or online.

## 2020-02-24 LAB — CBC WITH DIFFERENTIAL/PLATELET
Absolute Monocytes: 518 cells/uL (ref 200–950)
Basophils Absolute: 69 cells/uL (ref 0–200)
Basophils Relative: 1 %
Eosinophils Absolute: 117 cells/uL (ref 15–500)
Eosinophils Relative: 1.7 %
HCT: 44.2 % (ref 38.5–50.0)
Hemoglobin: 14 g/dL (ref 13.2–17.1)
Lymphs Abs: 1939 cells/uL (ref 850–3900)
MCH: 25.5 pg — ABNORMAL LOW (ref 27.0–33.0)
MCHC: 31.7 g/dL — ABNORMAL LOW (ref 32.0–36.0)
MCV: 80.5 fL (ref 80.0–100.0)
MPV: 9.5 fL (ref 7.5–12.5)
Monocytes Relative: 7.5 %
Neutro Abs: 4257 cells/uL (ref 1500–7800)
Neutrophils Relative %: 61.7 %
Platelets: 216 10*3/uL (ref 140–400)
RBC: 5.49 10*6/uL (ref 4.20–5.80)
RDW: 15.1 % — ABNORMAL HIGH (ref 11.0–15.0)
Total Lymphocyte: 28.1 %
WBC: 6.9 10*3/uL (ref 3.8–10.8)

## 2020-02-24 LAB — LIPID PANEL
Cholesterol: 146 mg/dL (ref <200)
HDL: 35 mg/dL — ABNORMAL LOW (ref >=40)
LDL Cholesterol (Calc): 83 mg/dL (calc)
Non-HDL Cholesterol (Calc): 111 mg/dL (calc) (ref <130)
Total CHOL/HDL Ratio: 4.2 (calc) (ref <5.0)
Triglycerides: 189 mg/dL — ABNORMAL HIGH (ref <150)

## 2020-02-24 LAB — COMPLETE METABOLIC PANEL WITH GFR
AG Ratio: 1.4 (calc) (ref 1.0–2.5)
ALT: 12 U/L (ref 9–46)
AST: 12 U/L (ref 10–35)
Albumin: 3.6 g/dL (ref 3.6–5.1)
Alkaline phosphatase (APISO): 70 U/L (ref 35–144)
BUN: 14 mg/dL (ref 7–25)
CO2: 29 mmol/L (ref 20–32)
Calcium: 9.1 mg/dL (ref 8.6–10.3)
Chloride: 102 mmol/L (ref 98–110)
Creat: 0.93 mg/dL (ref 0.70–1.18)
GFR, Est African American: 95 mL/min/{1.73_m2} (ref >=60)
GFR, Est Non African American: 82 mL/min/{1.73_m2} (ref >=60)
Globulin: 2.6 g/dL (calc) (ref 1.9–3.7)
Glucose, Bld: 134 mg/dL — ABNORMAL HIGH (ref 65–99)
Potassium: 4.6 mmol/L (ref 3.5–5.3)
Sodium: 137 mmol/L (ref 135–146)
Total Bilirubin: 0.5 mg/dL (ref 0.2–1.2)
Total Protein: 6.2 g/dL (ref 6.1–8.1)

## 2020-02-24 LAB — HEMOGLOBIN A1C
Hgb A1c MFr Bld: 6.6 % of total Hgb — ABNORMAL HIGH (ref <5.7)
Mean Plasma Glucose: 143 (calc)
eAG (mmol/L): 7.9 (calc)

## 2020-02-24 LAB — TSH: TSH: 0.77 mIU/L (ref 0.40–4.50)

## 2020-02-24 LAB — VITAMIN B12: Vitamin B-12: 357 pg/mL (ref 200–1100)

## 2020-02-24 LAB — IRON, TOTAL/TOTAL IRON BINDING CAP
%SAT: 16 % (calc) — ABNORMAL LOW (ref 20–48)
Iron: 50 ug/dL (ref 50–180)
TIBC: 306 mcg/dL (calc) (ref 250–425)

## 2020-02-24 LAB — MAGNESIUM: Magnesium: 1.7 mg/dL (ref 1.5–2.5)

## 2020-02-24 LAB — VITAMIN D 25 HYDROXY (VIT D DEFICIENCY, FRACTURES): Vit D, 25-Hydroxy: 52 ng/mL (ref 30–100)

## 2020-02-24 LAB — FERRITIN: Ferritin: 612 ng/mL — ABNORMAL HIGH (ref 24–380)

## 2020-02-26 ENCOUNTER — Other Ambulatory Visit: Payer: Self-pay | Admitting: Internal Medicine

## 2020-02-26 DIAGNOSIS — I10 Essential (primary) hypertension: Secondary | ICD-10-CM

## 2020-02-26 MED ORDER — ATENOLOL 100 MG PO TABS: ORAL_TABLET | ORAL | 0 refills | Status: DC

## 2020-03-31 ENCOUNTER — Other Ambulatory Visit: Payer: Self-pay | Admitting: Internal Medicine

## 2020-04-29 ENCOUNTER — Encounter: Payer: Self-pay | Admitting: Internal Medicine

## 2020-05-03 LAB — HM DIABETES EYE EXAM

## 2020-05-10 ENCOUNTER — Encounter: Payer: Managed Care, Other (non HMO) | Admitting: Physician Assistant

## 2020-05-10 ENCOUNTER — Other Ambulatory Visit: Payer: Self-pay | Admitting: Internal Medicine

## 2020-05-11 ENCOUNTER — Other Ambulatory Visit: Payer: Self-pay | Admitting: Internal Medicine

## 2020-05-11 ENCOUNTER — Telehealth: Payer: Self-pay | Admitting: *Deleted

## 2020-05-11 MED ORDER — METFORMIN HCL ER 500 MG PO TB24: ORAL_TABLET | ORAL | 3 refills | Status: DC

## 2020-05-11 NOTE — Telephone Encounter (Signed)
Spouse informed Dr Melford Aase sent in an RX for Metformin RX to replace Janumet and advised patient to take Imodium with the MF.

## 2020-05-12 ENCOUNTER — Other Ambulatory Visit: Payer: Self-pay | Admitting: Internal Medicine

## 2020-05-12 DIAGNOSIS — K2100 Gastro-esophageal reflux disease with esophagitis, without bleeding: Secondary | ICD-10-CM

## 2020-05-12 MED ORDER — FAMOTIDINE 40 MG PO TABS: ORAL_TABLET | ORAL | 0 refills | Status: DC

## 2020-05-25 ENCOUNTER — Encounter: Payer: Self-pay | Admitting: Internal Medicine

## 2020-05-30 ENCOUNTER — Other Ambulatory Visit: Payer: Self-pay | Admitting: Internal Medicine

## 2020-06-02 ENCOUNTER — Other Ambulatory Visit: Payer: Self-pay | Admitting: Internal Medicine

## 2020-06-02 DIAGNOSIS — I10 Essential (primary) hypertension: Secondary | ICD-10-CM

## 2020-06-02 MED ORDER — ATENOLOL 100 MG PO TABS: ORAL_TABLET | ORAL | 0 refills | Status: DC

## 2020-06-28 ENCOUNTER — Encounter: Payer: Managed Care, Other (non HMO) | Admitting: Physician Assistant

## 2020-07-07 ENCOUNTER — Encounter: Payer: Self-pay | Admitting: Internal Medicine

## 2020-07-07 NOTE — Progress Notes (Signed)
Annual  Screening/Preventative Visit  & Comprehensive Evaluation & Examination      This very nice 74 y.o.  MWM presents for a Screening /Preventative Visit & comprehensive evaluation and management of multiple medical co-morbidities.  Patient has been followed for HTN, HLD, T2_NIDDM  and Vitamin D Deficiency.  Patient has hx/o Gout - stable /quiescent off meds. He also has hx/o OSA alleging 100% compliance on CPAP with improved sleep hygiene.  Patient also has GERD controlled with diet & hismeds. Patient has Aortic atherosclerosis by Abd CT scan on 09/19/2019.     HTN predates ciras 1985. Patient's BP has been controlled at home.  Today's BP is at goal -  136/74. Patient denies any cardiac symptoms as chest pain, palpitations, shortness of breath, dizziness or ankle swelling.     Patient's hyperlipidemia is controlled with diet and medications. Patient denies myalgias or other medication SE's. 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 Last A1c was not at goal:   Lab Results  Component Value Date   HGBA1C 6.6 (H) 02/23/2020    Wt Readings from Last 3 Encounters:     02/23/20 202 lb (91.6 kg)  11/19/19 216 lb (98 kg)  10/01/19 222 lb 0.1 oz   10/11/2018   238 lb 9.6 oz                Finally, patient has history of Vitamin D Deficiency ("37" /2008) and last vitamin D was not at goal:   Lab Results  Component Value Date   VD25OH 52 02/23/2020    Current Outpatient Medications on File Prior to Visit  Medication Sig  . aspirin EC 81 MG tablet Take daily.  Marland Kitchen atenolol   100 MG tablet Take       1 tablet       Daily       for BP   . VITAMIN D 2000 units  Take 8,000 Units  daily.  . TRULICITY   1.5 IH/0.3UU   Inject 0.5 mLs (1.5 mg total) into the skin once a week.  . famotidine  40 MG tablet Take     1 tablet      at Bedtime       for Indigestion & Acid Reflux  . gabapentin  100 MG capsule Take 1 to 2 capsules 2 to 3 x  /day as needed for Chronic Pain  . glyBURIDE  5 MG tablet TAKE 1/2 TO 1 TABLET  THREE TIMES DAILY BEFORE MEALS AS DIRECTED FOR DIABETES (Patient taking differently: Take 2.5-5 mg by mouth See admin instructions. )  . meclizine  25 MG tablet Take 25 mg by mouth 3 (three) times daily as needed for dizziness.  . metFORMIN-XR 500 MG  Take     1 to 2 tablets       2 x /day        with Meals      for Diabetes  . ondansetron  8 MG tablet Take 1/2 to 1 tablet 3 x day if needed for Nausea  . oxybutynin  5 MG  tablet Take 1 tablet (5 mg total) by mouth every 8 (eight) hours as needed for bladder spasms.  . tamsulosin  0.4 MG CAPS  Take 1 tablet Daily for Prostate     Allergies  Allergen Reactions  . Ace Inhibitors Other (See Comments)    Unknown  . Ibuprofen Other (See Comments)    GI upset  . Invokana [Canagliflozin] Other (See Comments)    Unknown  . Metformin And Related Diarrhea  . Quinidine Other (See Comments)    Unknown    Past Medical History:  Diagnosis Date  .  Covid-19 Positive (02/06/2019)  02/11/2019  . BPH (benign prostatic hypertrophy)   . Diabetes mellitus without complication ([place])   . GERD (gastroesophageal reflux disease)   . Headache    PMH  . Hepatic steatosis   . History of kidney stones   . Hydroureteronephrosis   . Hypertension   . Hypogonadism male   . Mass    left shoulder AC joint  . OSA (obstructive sleep apnea)    wears CPAP  . PONV (postoperative nausea and vomiting)   . Renal calculi   . Sarcoidosis   . Vitamin D deficiency    Health Maintenance  Topic Date Due  . FOOT EXAM  10/13/2019  . URINE MICROALBUMIN  10/14/2019  . INFLUENZA VACCINE  02/22/2020  .  COVID-19 Vaccine (3 - Booster for Pfizer series) 03/12/2020  . HEMOGLOBIN A1C  08/25/2020  . Fecal DNA (Cologuard)  10/08/2020  . OPHTHALMOLOGY EXAM  05/03/2021  . TETANUS/TDAP  08/12/2022  . Hepatitis C Screening  Completed  . PNA vac Low Risk Adult  Completed   Immunization History  Administered Date(s) Administered  . Fluad Quad(high Dose 65+) 04/28/2019  . Influenza Split 06/23/2013  . Influenza, High Dose Seasonal PF 06/29/2014, 05/24/2015, 04/24/2016, 06/11/2017  . Influenza,inj,quad, With Preservative 04/23/2017  . Influenza-Unspecified 04/28/2019  . PFIZER SARS-COV-2 Vaccination 08/23/2019, 09/13/2019  . Pneumococcal Conjugate-13 01/05/2014  . Pneumococcal-Unspecified 07/24/1994  . Td 06/23/2002  . Zoster 10/23/2010   Cologard - 10/08/2017    - (+) Positive, then  Colon      - 12/31/2017    - Colonoscopy - Dr Collene Mares - Negative - recc 10 yr f/u  Past Surgical History:  Procedure Laterality Date  . APPENDECTOMY    . COLONOSCOPY    . CYSTOSCOPY/URETEROSCOPY/HOLMIUM LASER/STENT PLACEMENT Right 10/01/2019   Procedure: CYSTOSCOPY/RETROGRADE/URETEROSCOPY/HOLMIUM LASER/STENT PLACEMENT;  Surgeon: Ceasar Mons, MD;  Location: [facility];  Service: Urology;  Laterality: Right;  ONLY NEEDS 45 MIN  . HAND SURGERY Right 2019   Dr. Amedeo Plenty, tendon repair of R thumb  . HERNIA REPAIR  [id number]   umbilical  . KNEE ARTHROSCOPY    . ORIF CLAVICULAR FRACTURE Left 03/29/2018   Procedure: LEFT SHOULDER OPEN DISTAL CLAVICLE RESECTION, CYST EXCISION;  Surgeon: Netta Cedars, MD;  Location: [facility];  Service: Orthopedics;  Laterality: Left;  . ROTATOR CUFF REPAIR Left 1996  . ROTATOR CUFF REPAIR Right 1997  . TONSILLECTOMY  1952   Family History  Problem Relation Age of Onset  . Thyroid disease Father   . Cancer Father        lung  . Cancer Mother        throat  . Diabetes Mother   . Thyroid disease Sister   . Hypertension Son   . Hyperlipidemia Son   . Cancer Paternal Grandmother         colon   Social History   Socioeconomic History  .  Marital status: Married    Spouse name: [name]  . Number of children: 1 son , 1 daughter & 3 Grandchildren  Occupational History  . 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 Smoking status: Never Smoker  . Smokeless tobacco: Never Used  Vaping Use  . Vaping Use: Never used  Substance and Sexual Activity  . Alcohol use: Yes    Comment: very rarely  . Drug use: Never  . Sexual activity: Not on file    ROS Constitutional: Denies fever, chills,  headaches, insomnia,  night sweats or change in appetite. Does c/o fatigue. Eyes: Denies redness, blurred vision, diplopia, discharge, itchy or watery eyes.  ENT: Denies discharge, congestion, post nasal drip, epistaxis, sore throat, earache, hearing loss, dental pain, Tinnitus, Vertigo, Sinus pain or snoring.  Cardio: Denies chest pain, palpitations, irregular heartbeat, syncope, dyspnea, diaphoresis, orthopnea, PND, claudication or edema Respiratory: denies cough, dyspnea, DOE, pleurisy, hoarseness, laryngitis or wheezing.  Gastrointestinal: Denies dysphagia, heartburn, reflux, water brash, pain, cramps, nausea, vomiting, bloating, diarrhea, constipation, hematemesis, melena, hematochezia, jaundice or hemorrhoids Genitourinary: Denies dysuria, frequency, urgency, nocturia, hesitancy, discharge, hematuria or flank pain Musculoskeletal: Denies arthralgia, myalgia, stiffness, Jt. Swelling, pain, limp or strain/sprain. Denies Falls. Skin: Denies puritis, rash, hives, warts, acne, eczema or change in skin lesion Neuro: No weakness, tremor, incoordination, spasms, paresthesia or pain Psychiatric: Denies confusion, memory loss or sensory loss. Denies Depression. Endocrine: Denies change in weight, skin, hair change, nocturia, and paresthesia, diabetic polys, visual blurring or hyper / hypo glycemic episodes.  Heme/Lymph: No excessive bleeding, bruising or enlarged lymph nodes.  Physical  Exam  BP 136/74   Pulse 71   Temp (!) 97 F (36.1 C)   Ht 5\' 8"  (1.727 m)   Wt 208 lb (94.3 kg)   SpO2 97%   BMI 31.63 kg/m   General Appearance: Over nourished and well groomed and in no apparent distress.  Eyes: PERRLA, EOMs, conjunctiva no swelling or erythema, normal fundi and vessels. Sinuses: No frontal/maxillary tenderness ENT/Mouth: EACs patent / TMs  nl. Nares clear without erythema, swelling, mucoid exudates. Oral hygiene is good. No erythema, swelling, or exudate. Tongue normal, non-obstructing. Tonsils not swollen or erythematous. Hearing normal.  Neck: Supple, thyroid not palpable. No bruits, nodes or JVD. Respiratory: Respiratory effort normal.  BS equal and clear bilateral without rales, rhonci, wheezing or stridor. Cardio: Heart sounds are normal with regular rate and rhythm and no murmurs, rubs or gallops. Peripheral pulses are normal and equal bilaterally without edema. No aortic or femoral bruits. Chest: symmetric with normal excursions and percussion.  Abdomen: Soft, with Nl bowel sounds. Nontender, no guarding, rebound, hernias, masses, or organomegaly.  Lymphatics: Non tender without lymphadenopathy.  Musculoskeletal: Full ROM all peripheral extremities, joint stability, 5/5 strength, and normal gait. Skin: Warm and dry without rashes, lesions, cyanosis, clubbing or  ecchymosis.  Neuro: Cranial nerves intact, reflexes equal bilaterally. Normal muscle tone, no cerebellar symptoms. Sensation intact.  Pysch: Alert and oriented X 3 with normal affect, insight and judgment appropriate.   Assessment and Plan  1. Annual Preventative/Screening Exam    2. Essential hypertension  - EKG 12-Lead - [place], RETROPERITNL ABD,  LTD - Urinalysis, Routine w reflex microscopic - Microalbumin / creatinine urine ratio - CBC with Differential/Platelet - COMPLETE METABOLIC PANEL WITH GFR - Magnesium - TSH  3. Hyperlipidemia associated with type 2 diabetes mellitus ([place])  -  EKG 12-Lead - [place], RETROPERITNL ABD,  LTD - Lipid panel - TSH  4.  Type 2 diabetes mellitus with stage 2 chronic kidney  disease, without long-term current use of insulin ([facility])  - EKG 12-Lead - [place], RETROPERITNL ABD,  LTD - HM DIABETES FOOT EXAM - LOW EXTREMITY NEUR EXAM DOCUM - CBC with Differential/Platelet - Hemoglobin A1c - Insulin, random  5. Vitamin D deficiency  - VITAMIN D 25 Hydroxyl  6. Gastroesophageal reflux disease   - CBC with Differential/Platelet  7. BPH with obstruction/lower urinary tract symptoms  - PSA  8. Prostate cancer screening  - PSA  9. Screening for colorectal cancer  - POC Hemoccult Bld/Stl   10. Screening for ischemic heart disease  - EKG 12-Lead  11. FH: hypertension  - EKG 12-Lead - [place], RETROPERITNL ABD,  LTD  12. Aortic atherosclerosis ([place]) by Abd CT scan ion 09/19/2019   13. Screening for AAA (aortic abdominal aneurysm)  - [place], RETROPERITNL ABD,  LTD  14. Fatigue  - CBC with Differential/Platelet - TSH  15. Needs flu shot  - Flu vaccine HIGH DOSE PF  16. Medication management  - Urinalysis, Routine w reflex microscopic - Microalbumin / creatinine urine ratio - CBC with Differential/Platelet - COMPLETE METABOLIC PANEL WITH GFR - Magnesium - Lipid panel - TSH - Hemoglobin A1c - Insulin, random - VITAMIN D 25 Hydroxy            Patient was counseled in prudent diet, weight control to achieve/maintain BMI less than 25, BP monitoring, regular exercise and medications as discussed.  Discussed med effects and SE's. Routine screening labs and tests as requested with regular follow-up as recommended. Over 40 minutes of exam, counseling, chart review and high complex critical decision making was performed   Kirtland Bouchard, MD

## 2020-07-07 NOTE — Patient Instructions (Signed)

## 2020-07-08 ENCOUNTER — Other Ambulatory Visit: Payer: Self-pay

## 2020-07-08 ENCOUNTER — Ambulatory Visit (INDEPENDENT_AMBULATORY_CARE_PROVIDER_SITE_OTHER): Payer: Managed Care, Other (non HMO) | Admitting: Internal Medicine

## 2020-07-08 VITALS — BP 136/74 | HR 71 | Temp 97.0°F | Ht 68.0 in | Wt 208.0 lb

## 2020-07-08 DIAGNOSIS — I700 Atherosclerosis of aorta: Secondary | ICD-10-CM

## 2020-07-08 DIAGNOSIS — Z0000 Encounter for general adult medical examination without abnormal findings: Secondary | ICD-10-CM

## 2020-07-08 DIAGNOSIS — I10 Essential (primary) hypertension: Secondary | ICD-10-CM

## 2020-07-08 DIAGNOSIS — N401 Enlarged prostate with lower urinary tract symptoms: Secondary | ICD-10-CM

## 2020-07-08 DIAGNOSIS — Z131 Encounter for screening for diabetes mellitus: Secondary | ICD-10-CM | POA: Diagnosis not present

## 2020-07-08 DIAGNOSIS — K2100 Gastro-esophageal reflux disease with esophagitis, without bleeding: Secondary | ICD-10-CM

## 2020-07-08 DIAGNOSIS — Z23 Encounter for immunization: Secondary | ICD-10-CM | POA: Diagnosis not present

## 2020-07-08 DIAGNOSIS — Z79899 Other long term (current) drug therapy: Secondary | ICD-10-CM | POA: Diagnosis not present

## 2020-07-08 DIAGNOSIS — Z136 Encounter for screening for cardiovascular disorders: Secondary | ICD-10-CM

## 2020-07-08 DIAGNOSIS — E559 Vitamin D deficiency, unspecified: Secondary | ICD-10-CM | POA: Diagnosis not present

## 2020-07-08 DIAGNOSIS — Z8249 Family history of ischemic heart disease and other diseases of the circulatory system: Secondary | ICD-10-CM | POA: Diagnosis not present

## 2020-07-08 DIAGNOSIS — Z1389 Encounter for screening for other disorder: Secondary | ICD-10-CM

## 2020-07-08 DIAGNOSIS — Z0001 Encounter for general adult medical examination with abnormal findings: Secondary | ICD-10-CM

## 2020-07-08 DIAGNOSIS — R5383 Other fatigue: Secondary | ICD-10-CM

## 2020-07-08 DIAGNOSIS — R350 Frequency of micturition: Secondary | ICD-10-CM | POA: Diagnosis not present

## 2020-07-08 DIAGNOSIS — E785 Hyperlipidemia, unspecified: Secondary | ICD-10-CM

## 2020-07-08 DIAGNOSIS — Z1211 Encounter for screening for malignant neoplasm of colon: Secondary | ICD-10-CM

## 2020-07-08 DIAGNOSIS — Z13220 Encounter for screening for lipoid disorders: Secondary | ICD-10-CM

## 2020-07-08 DIAGNOSIS — Z125 Encounter for screening for malignant neoplasm of prostate: Secondary | ICD-10-CM

## 2020-07-08 DIAGNOSIS — N182 Chronic kidney disease, stage 2 (mild): Secondary | ICD-10-CM

## 2020-07-08 MED ORDER — TAMSULOSIN HCL 0.4 MG PO CAPS: ORAL_CAPSULE | ORAL | 0 refills | Status: DC

## 2020-07-09 ENCOUNTER — Other Ambulatory Visit: Payer: Self-pay | Admitting: Internal Medicine

## 2020-07-09 LAB — COMPLETE METABOLIC PANEL WITH GFR
AG Ratio: 1.9 (calc) (ref 1.0–2.5)
ALT: 10 U/L (ref 9–46)
AST: 13 U/L (ref 10–35)
Albumin: 4.2 g/dL (ref 3.6–5.1)
Alkaline phosphatase (APISO): 74 U/L (ref 35–144)
BUN: 19 mg/dL (ref 7–25)
CO2: 26 mmol/L (ref 20–32)
Calcium: 9.9 mg/dL (ref 8.6–10.3)
Chloride: 101 mmol/L (ref 98–110)
Creat: 1.02 mg/dL (ref 0.70–1.18)
GFR, Est African American: 84 mL/min/{1.73_m2} (ref >=60)
GFR, Est Non African American: 73 mL/min/{1.73_m2} (ref >=60)
Globulin: 2.2 g/dL (calc) (ref 1.9–3.7)
Glucose, Bld: 117 mg/dL — ABNORMAL HIGH (ref 65–99)
Potassium: 4.2 mmol/L (ref 3.5–5.3)
Sodium: 140 mmol/L (ref 135–146)
Total Bilirubin: 0.4 mg/dL (ref 0.2–1.2)
Total Protein: 6.4 g/dL (ref 6.1–8.1)

## 2020-07-09 LAB — LIPID PANEL
Cholesterol: 175 mg/dL (ref <200)
HDL: 41 mg/dL (ref >=40)
LDL Cholesterol (Calc): 86 mg/dL (calc)
Non-HDL Cholesterol (Calc): 134 mg/dL (calc) — ABNORMAL HIGH (ref <130)
Total CHOL/HDL Ratio: 4.3 (calc) (ref <5.0)
Triglycerides: 361 mg/dL — ABNORMAL HIGH (ref <150)

## 2020-07-09 LAB — CBC WITH DIFFERENTIAL/PLATELET
Absolute Monocytes: 515 cells/uL (ref 200–950)
Basophils Absolute: 59 cells/uL (ref 0–200)
Basophils Relative: 0.9 %
Eosinophils Absolute: 172 cells/uL (ref 15–500)
Eosinophils Relative: 2.6 %
HCT: 46.5 % (ref 38.5–50.0)
Hemoglobin: 15.3 g/dL (ref 13.2–17.1)
Lymphs Abs: 2053 cells/uL (ref 850–3900)
MCH: 27.1 pg (ref 27.0–33.0)
MCHC: 32.9 g/dL (ref 32.0–36.0)
MCV: 82.3 fL (ref 80.0–100.0)
MPV: 9.5 fL (ref 7.5–12.5)
Monocytes Relative: 7.8 %
Neutro Abs: 3802 cells/uL (ref 1500–7800)
Neutrophils Relative %: 57.6 %
Platelets: 215 10*3/uL (ref 140–400)
RBC: 5.65 10*6/uL (ref 4.20–5.80)
RDW: 14.1 % (ref 11.0–15.0)
Total Lymphocyte: 31.1 %
WBC: 6.6 10*3/uL (ref 3.8–10.8)

## 2020-07-09 LAB — MICROALBUMIN / CREATININE URINE RATIO
Creatinine, Urine: 83 mg/dL (ref 20–320)
Microalb Creat Ratio: 95 mcg/mg creat — ABNORMAL HIGH (ref <30)
Microalb, Ur: 7.9 mg/dL

## 2020-07-09 LAB — HEMOGLOBIN A1C
Hgb A1c MFr Bld: 7.4 % of total Hgb — ABNORMAL HIGH (ref <5.7)
Mean Plasma Glucose: 166 mg/dL
eAG (mmol/L): 9.2 mmol/L

## 2020-07-09 LAB — URINALYSIS, ROUTINE W REFLEX MICROSCOPIC
Bacteria, UA: NONE SEEN /HPF
Bilirubin Urine: NEGATIVE
Glucose, UA: NEGATIVE
Hgb urine dipstick: NEGATIVE
Hyaline Cast: NONE SEEN /LPF
Ketones, ur: NEGATIVE
Nitrite: NEGATIVE
RBC / HPF: NONE SEEN /HPF (ref 0–2)
Specific Gravity, Urine: 1.015 (ref 1.001–1.03)
Squamous Epithelial / HPF: NONE SEEN /HPF (ref <=5)
pH: <=5 (ref 5.0–8.0)

## 2020-07-09 LAB — MAGNESIUM: Magnesium: 1.8 mg/dL (ref 1.5–2.5)

## 2020-07-09 LAB — INSULIN, RANDOM: Insulin: 12.7 u[IU]/mL

## 2020-07-09 LAB — TSH: TSH: 1.41 mIU/L (ref 0.40–4.50)

## 2020-07-09 LAB — VITAMIN D 25 HYDROXY (VIT D DEFICIENCY, FRACTURES): Vit D, 25-Hydroxy: 44 ng/mL (ref 30–100)

## 2020-07-09 LAB — PSA: PSA: 0.35 ng/mL (ref <=4.0)

## 2020-07-09 MED ORDER — CIPROFLOXACIN HCL 500 MG PO TABS: ORAL_TABLET | ORAL | 0 refills | Status: DC

## 2020-07-09 NOTE — Progress Notes (Signed)
========================================================== - Test results slightly outside the reference range are not unusual. If there is anything important, I will review this with you,  otherwise it is considered normal test values.  If you have further questions,  please do not hesitate to contact me at the office or via My Chart.  ==========================================================  -  U/A is loaded with WBC and shows a prostate Infection, So.............  - Sent in Rx for an Antibiotic to your Drug store  ==========================================================  -  PSA is Normal - Great  ==========================================================  -  Kidney functions beginning to look a little dehydrated again  So. . . . . . . Marland Kitchen   - it's very important to drink lots MORE fluids   - at least 6 bottles (16 oz) / day = 96 oz (or more)  ==========================================================  -   Magnesium  -   1.8  -  very  low- goal is betw 2.0 - 2.5,   - So..............Marland Kitchen  Recommend that you take  Magnesium 500 mg tablet - 1 or 2 tablets /daily   - also important to eat lots of  leafy green vegetables   - spinach - Kale - collards - greens - okra - asparagus  - broccoli - quinoa - squash - almonds   - black, red, white beans  -  peas - green beans ==========================================================  -  Total chol = 175 and LDL Chol = 86 - Both  Excellent   - Very low risk for Heart Attack  / Stroke ==========================================================  - But Triglycerides (   361   ) or fats in blood are too high  (goal is less than 150)    - Recommend avoid fried & greasy foods,  sweets / candy,   - Avoid white rice  (brown or wild rice or Quinoa is OK),   - Avoid white potatoes  (sweet potatoes are OK)   - Avoid anything made from white flour  - bagels, doughnuts, rolls, buns, biscuits, white and   wheat breads, pizza crust and  traditional  pasta made of white flour & egg white  - (vegetarian pasta or spinach or wheat pasta is OK).    - Multi-grain bread is OK - like multi-grain flat bread or  sandwich thins.   - Avoid alcohol in excess.   - Exercise is also important.  ==========================================================  -  A1c - Worse - gone up from 6.6% to now 7.4% - Too high    -  Being diabetic has a  300% increased risk for heart attack,  stroke, cancer, and alzheimer- type vascular dementia.   It is very important that you work harder with diet by  avoiding all foods that are white except chicken,   fish & calliflower.  - Avoid white rice  (brown & wild rice is OK),   - Avoid white potatoes  (sweet potatoes in moderation is OK),   - Avoid White bread or WHEAT  bread or anything made out of   WHITE FLOUR  like bagels, donuts, rolls, buns, biscuits, cakes,  - pastries, cookies, pizza crust, and pasta (made from  white flour & egg whites)   - vegetarian pasta or spinach or wheat pasta is OK.  - Multigrain breads like Arnold's, Pepperidge Farm or   multigrain sandwich thins or high fiber breads like   [place] bread or "Dave's Killer" breads that are  4 to 5 grams fiber per slice !  are best.  Diet, exercise and weight loss can reverse and cure  diabetes in the early stages.    - Diet, exercise and weight loss is very important in the   control and prevention of complications of diabetes which  affects every system in your body, ie.   -Brain - dementia/stroke,  - eyes - glaucoma/blindness,  - heart - heart attack/heart failure,  - kidneys - dialysis,  - stomach - gastric paralysis,  - intestines - malabsorption,  - nerves - severe painful neuritis,  - circulation - gangrene & loss of a leg(s)  - and finally  . . . . . . . . . . . . . . . . . .    - cancer and Alzheimers. ==========================================================  -  Vitamin D = 44 - Way too Low  !    - Vitamin D goal is between 70-100.   $$$$$$$$$$$$$$$$$$$$$$$$$$$$$$$$$$$$$$$$$$$$$$$$$$$$$$$$$$$$$  - Please make sure that you are taking your Vitamin D up to   5,000 unit capsules and take 3 capsules = 15,000 units  /day !  $$$$$$$$$$$$$$$$$$$$$$$$$$$$$$$$$$$$$$$$$$$$$$$$$$$$$$$$$$$$$  - It is very important as a natural anti-inflammatory and helping the  immune system protect against viral infections, like the Covid-19    helping hair, skin, and nails, as well as reducing stroke and  heart attack risk.   - It helps your bones and helps with mood.  - It also decreases numerous cancer risks so please  take it as directed.   - Low Vit D is associated with a 200-300% higher risk for  CANCER   and 200-300% higher risk for HEART   ATTACK  &  STROKE.    - It is also associated with higher death rate at younger ages,   autoimmune diseases like Rheumatoid arthritis, Lupus,  Multiple Sclerosis.     - Also many other serious conditions, like depression, Alzheimer's  Dementia, infertility, muscle aches, fatigue, fibromyalgia   - just to name a few. ==========================================================  - All Else - CBC - Kidneys - Electrolytes - Liver - Magnesium & Thyroid    - all  Normal / OK ========================================================== ==========================================================

## 2020-07-10 DIAGNOSIS — I700 Atherosclerosis of aorta: Secondary | ICD-10-CM | POA: Insufficient documentation

## 2020-07-22 ENCOUNTER — Other Ambulatory Visit: Payer: Self-pay | Admitting: Internal Medicine

## 2020-08-07 ENCOUNTER — Other Ambulatory Visit: Payer: Managed Care, Other (non HMO)

## 2020-08-11 ENCOUNTER — Ambulatory Visit: Payer: Managed Care, Other (non HMO) | Admitting: Internal Medicine

## 2020-08-16 ENCOUNTER — Other Ambulatory Visit: Payer: Self-pay

## 2020-08-16 MED ORDER — TRULICITY 1.5 MG/0.5ML ~~LOC~~ SOAJ: 1.5000 mg | SUBCUTANEOUS | 2 refills | Status: DC

## 2020-08-16 NOTE — Telephone Encounter (Signed)
REFILL ON trulicity 1.'5mg'$ /0.80m

## 2020-09-08 ENCOUNTER — Telehealth: Payer: Self-pay | Admitting: *Deleted

## 2020-09-08 DIAGNOSIS — N182 Chronic kidney disease, stage 2 (mild): Secondary | ICD-10-CM

## 2020-09-08 DIAGNOSIS — E1122 Type 2 diabetes mellitus with diabetic chronic kidney disease: Secondary | ICD-10-CM

## 2020-09-08 NOTE — Telephone Encounter (Signed)
Returned call to spouse in regarding to early A1c lab check. Patient having knee surgery in 10/2020 and A1c must be below 7.7 for the surgery. Patient wants early result in order to evaluate current result. [place] for lab only per Dr Melford Aase.

## 2020-09-20 ENCOUNTER — Other Ambulatory Visit: Payer: Self-pay

## 2020-09-20 ENCOUNTER — Other Ambulatory Visit (INDEPENDENT_AMBULATORY_CARE_PROVIDER_SITE_OTHER): Payer: Managed Care, Other (non HMO)

## 2020-09-20 DIAGNOSIS — E1122 Type 2 diabetes mellitus with diabetic chronic kidney disease: Secondary | ICD-10-CM

## 2020-09-20 DIAGNOSIS — R7309 Other abnormal glucose: Secondary | ICD-10-CM

## 2020-09-20 DIAGNOSIS — N182 Chronic kidney disease, stage 2 (mild): Secondary | ICD-10-CM

## 2020-09-21 LAB — HEMOGLOBIN A1C
Hgb A1c MFr Bld: 7 % of total Hgb — ABNORMAL HIGH (ref <5.7)
Mean Plasma Glucose: 154 mg/dL
eAG (mmol/L): 8.5 mmol/L

## 2020-09-21 NOTE — Progress Notes (Signed)
============================================================ ============================================================  -    A1c = 7.0 % - is a little better ============================================================ ============================================================

## 2020-10-14 ENCOUNTER — Telehealth: Payer: Self-pay | Admitting: *Deleted

## 2020-10-14 NOTE — Telephone Encounter (Signed)
Returned call to patient regarding BP. Spoke with spouse who states patient's BP is running 160/95 and up. Per Dr Melford Aase, continue Atenolol 100 mg daily and increase Losartan 50 mg to 100 mg daily, until OV 10/18/2020 with Garnet Sierras, NP.

## 2020-10-17 NOTE — Progress Notes (Signed)
FOLLOW UP 3 MONTH   Assessment and Plan:  Essential hypertension Change the way you take your medications: Rx Olmesartan 40m every morning and Atenolol 1061mevery night/bedtime.  Monitor blood pressure DECREASE SODIUM intake Monitor blood pressure at home; call if consistently over 130/80 Continue DASH diet.   Reminder to go to the ER if any CP, SOB, nausea, dizziness, severe HA, changes vision/speech, left arm numbness and tingling and jaw pain.   Type 2 diabetes mellitus with stage 2 chronic kidney disease, without long-term current use of insulin (HCC) Taking Trulicity 1.6.5HQeekly Metformin 50024mne tablet related to GI side effects Taking Glyburide 4mg52m CBG greater than 180, has not had to take MUST CHECK BLOOD SUGAR at least BID -     Hemoglobin A1c Discussed general issues about diabetes pathophysiology and management., Educational material distributed., Suggested low cholesterol diet., Encouraged aerobic exercise., Discussed foot care., Reminded to get yearly retinal exam.  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  Hyperlipidemia associated with type 2 diabetes mellitus (HCC)[place] style controlled, discussed statin Discussed dietary and exercise modifications Low fat diet -     Lipid panel  Aortic atherosclerosis (HCC) by Abd CT scan ion 09/19/2019 Control blood pressure, lipids and glucose Disscused lifestyle modifications, diet & exercise Continue to monitor  BPH with obstruction/lower urinary tract symptoms Doing well at this time Continue medications:  Will continue to monitor Defer PSA this check  Hepatic steatosis Discussed dietary and exercise  modifications  OSA (obstructive sleep apnea) Continue CPAP/BiPAP, using nightly for at least 8 hours  Helping with daytime fatigue Weight loss still advised Discussed mask & tubing hygeine  Obesity (BMI 30.0-34.9) Discussed dietary and exercise modifications  Gastroesophageal reflux disease, unspecified whether esophagitis present Doing well at this time Continue:  Diet discussed Monitor for triggers Avoid food with high acid content Avoid excessive cafeine Increase water intake  Vitamin D deficiency Continue supplementation to maintain goal of 70-100 Taking Vitamin D 2,000 IU daily Defer vitamin D level  Medication management Continued   Over 30 minutes of face to face exam, counseling, chart review, and complex, high/moderate level critical decision making was performed this visit.   Future Appointments  Date Time Provider DepaHayti4/2022  9:00 AM [facility] PAT 1 [facility] None  02/07/2021  9:30 AM McKeUnk Pinto [facility] None  07/26/2021 11:00 AM McKeUnk Pinto [facility] None     HPI 74 y76. obese male with history of DM2, CKD improved, gout, HLD, HTN, OSA, kidney stone s/p right ureteroscopy, laser lithotripsy and ureteral stent on 03/10 with Dr. WintLovena Neighboursactive OA with elevated CRP/ESR, following with Dr. AryaKathlene Novemberre for 3 month follow up.   His last OV was 06/2020 for CPE.  He is a trucAdministratorh CDL.  He has been following with Dr AlusMaureen Ralphs Osteoarthritis of bilateral knee joints.  He is planning to have Total knee replacement, left on 11/01/20.  His preadmission appointment is 10/25/2020.    He contacted out office on 10/14/20 with elevated blood pressure readings.  He reports he has been  taking atenolol 122m daily and increased his losartan to 104mfrom 5077m He reports that since then his blood pressure.  He is consuming large amounts of sodium in diet and by means of salted peanuts.  Reports he does not monitor his intake of this.   He is accompanied by his wife today.   Patient had migratory arthralgias with elevated CRP/ESR, following with Dr. AryMegan Salonurrenlty on Leflunomide 10 mg daily- will monitor liver function.     BMI is Body mass index is 32.39 kg/m., he is working on diet and exercise. Wt Readings from Last 3 Encounters:  10/18/20 213 lb (96.6 kg)  07/08/20 208 lb (94.3 kg)  02/23/20 202 lb (91.6 kg)    The patient is diabetic and has his CDL.   He has hyperlipidemia, diet controlled He has CKD which improved with kidney stone removal and adjustment of medications.  He is on Trulicity 1.56.0AVce a week he is back on  on Janumet 50/500 He is taking glyburide 5 mg a half pill at night occ if over 170, has had a few low sugars waking up with he take the gliburide 5mg45mr CBG grater than 180, has not had to take Patient does have hypoglycemia awareness.  He is checking his sugar maybe once a day  and his wife has been working very hard on with him.  She expresses frustration today.  He is on losartan 100mg62m atenolol 100mg 51mflomax.  Wearing CPAP at night for OSA.  Lab Results  Component Value Date   HGBA1C 7.0 (H) 09/20/2020   Lab Results  Component Value Date   GFRNONAA 73 07/08/2020    Patient is on Vitamin D3 supplement for defciency (37, 2008).  Last result was not to goal.  Lab Results  Component Value Date   VD25OH 44 07/08/2020       Current Outpatient Medications (Endocrine & Metabolic):  .  DulMarland Kitchenglutide (TRULICITY) 1.5 MG/0.5WU/9.8JX Inject 1.5 mg into the skin once a week. .  glyBURIDE (DIABETA) 5 MG tablet, Take 1/2 to 1 tablet     3 x /day       with Meals for Diabetes (Patient taking differently: Take 5 mg by mouth daily as needed (blood sugar above 200).) .  metFORMIN (GLUCOPHAGE XR) 500 MG 24 hr tablet, Take     1 to 2 tablets       2 x /day        with Meals      for Diabetes (Patient taking differently: Take 500 mg by mouth daily with breakfast.)  Current Outpatient  Medications (Cardiovascular):  .  atenolol (TENORMIN) 100 MG tablet, Take       1 tablet       Daily       for BP (Patient taking differently: Take 100 mg by mouth daily.) .  olmesartan (BENICAR) 40 MG tablet, Take 1 tablet (40 mg total) by mouth daily.   Current Outpatient Medications (Analgesics):  .  acetaminophen (TYLENOL) 500 MG tablet, Take 1,000 mg by mouth every 6 (six) hours as needed for moderate pain. .  aspMarland Kitchenrin EC 81 MG tablet, Take 81 mg by mouth daily. .  lefMarland Kitchenunomide (ARAVA) 10 MG tablet, Take 10 mg by mouth daily.  Current Outpatient Medications (Hematological):  .  vitamin B-12 (CYANOCOBALAMIN) 1000 MCG tablet, Take 1,000 mcg by mouth daily.  Current Outpatient Medications (Other):  .  B-DMarland Kitchen3CC LUER-LOK SYR 21GX1" 21G X 1" 3 ML  MISC, use as directed .  Blood Glucose Monitoring Suppl (ONETOUCH VERIO) w/Device KIT, Test blood sugar once daily. .  Blood Glucose Monitoring Suppl DEVI, Use meter three times a day to check blood sugar .  Cholecalciferol (VITAMIN D3) 2000 units TABS, Take 2,000 Units by mouth daily. .  Cinnamon 500 MG TABS, Take 500 mg by mouth daily. .  famotidine (PEPCID) 40 MG tablet, Take     1 tablet      at Bedtime       for Indigestion & Acid Reflux (Patient taking differently: Take 40 mg by mouth daily.) .  gabapentin (NEURONTIN) 100 MG capsule, Take 1 to 2 capsules 2 to 3 x  /day as needed for Chronic Pain (Patient taking differently: Take 1 to 2 capsules 2 to 3 x  /day as needed for Chronic Pain) .  glucose blood (ONETOUCH VERIO) test strip, Test sugars 3 x a day for hypoglycemia/hyperglycemia and fluctuating sugars .  glucose blood test strip, Test blood sugar three times a day .  Lancets (ONETOUCH ULTRASOFT) lancets, Test blood sugar once daily .  magnesium gluconate (MAGONATE) 500 MG tablet, Take 500 mg by mouth daily. .  meclizine (ANTIVERT) 25 MG tablet, Take 25 mg by mouth 3 (three) times daily as needed for dizziness. .  ondansetron (ZOFRAN) 8 MG  tablet, Take 1/2 to 1 tablet 3 x day if needed for Nausea .  oxybutynin (DITROPAN) 5 MG tablet, Take 1 tablet (5 mg total) by mouth every 8 (eight) hours as needed for bladder spasms. .  tamsulosin (FLOMAX) 0.4 MG CAPS capsule, Take      1 tablet       at Bedtime        for Prostate (Patient taking differently: Take 0.4 mg by mouth at bedtime.) .  zinc gluconate 50 MG tablet, Take 50 mg by mouth daily.  Past Medical History:  Diagnosis Date  .  Covid-19 Positive (02/06/2019)  02/11/2019  . BPH (benign prostatic hypertrophy)   . Diabetes mellitus without complication ([place])   . GERD (gastroesophageal reflux disease)   . Headache    PMH  . Hepatic steatosis   . History of kidney stones   . Hydroureteronephrosis   . Hypertension   . Hypogonadism male   . Mass    left shoulder AC joint  . OSA (obstructive sleep apnea)    wears CPAP  . PONV (postoperative nausea and vomiting)   . Renal calculi   . Sarcoidosis   . Vitamin D deficiency      Allergies  Allergen Reactions  . Ace Inhibitors Other (See Comments)    Unknown  . Ibuprofen Other (See Comments)    GI upset  . Invokana [Canagliflozin] Other (See Comments)    Unknown  . Metformin And Related Diarrhea  . Quinidine Other (See Comments)    Unknown    ROS: all negative except above.   Physical Exam: Filed Weights   10/18/20 0835  Weight: 213 lb (96.6 kg)   BP (!) 142/90   Pulse 79   Temp (!) 97.3 F (36.3 C)   Wt 213 lb (96.6 kg)   SpO2 96%   BMI 32.39 kg/m    General Appearance: Well nourished, obese male in no apparent distress, appears uncomfortable. Eyes: PERRLA, conjunctiva no swelling or erythema ENT/Mouth: Ext aud canals clear, TMs without erythema, bulging. Mask in place; oral exam. Hearing normal.   Neck: Supple, thyroid normal.  Respiratory: Respiratory effort normal, BS equal  bilaterally, decreased breath sounds without rales, rhonchi, wheezing or stridor.  Cardio: RRR with no MRGs. Brisk peripheral  pulses with 1+ edema bilateral legs with stasis dermatitis.  Abdomen: Soft, obese + BS.  Non tender, no guarding, rebound, hernias, masses. Lymphatics: Non tender without lymphadenopathy.  Musculoskeletal:  Gait is antalgic with a cane. Straight leg raising with dorsiflexion negative bilaterally for radicular symptoms. Strength is normal and symmetric in arms and legs. There is not SI tenderness to palpation.  Skin: Warm, dry without rashes, lesions, ecchymosis.  Neuro: Normal muscle tone, Sensation intact.  Psych: Awake and oriented X 3, normal affect, Insight and Judgment appropriate.    EKG: defer 07/18/20 NSR  Bayard Males, DNP [facility] 10/18/2020  1:39 PM

## 2020-10-18 ENCOUNTER — Ambulatory Visit (INDEPENDENT_AMBULATORY_CARE_PROVIDER_SITE_OTHER): Payer: Managed Care, Other (non HMO) | Admitting: Adult Health Nurse Practitioner

## 2020-10-18 ENCOUNTER — Other Ambulatory Visit: Payer: Self-pay

## 2020-10-18 ENCOUNTER — Encounter: Payer: Self-pay | Admitting: Adult Health Nurse Practitioner

## 2020-10-18 VITALS — BP 142/90 | HR 79 | Temp 97.3°F | Wt 213.0 lb

## 2020-10-18 DIAGNOSIS — E1122 Type 2 diabetes mellitus with diabetic chronic kidney disease: Secondary | ICD-10-CM | POA: Diagnosis not present

## 2020-10-18 DIAGNOSIS — N400 Enlarged prostate without lower urinary tract symptoms: Secondary | ICD-10-CM

## 2020-10-18 DIAGNOSIS — I10 Essential (primary) hypertension: Secondary | ICD-10-CM | POA: Diagnosis not present

## 2020-10-18 DIAGNOSIS — K219 Gastro-esophageal reflux disease without esophagitis: Secondary | ICD-10-CM

## 2020-10-18 DIAGNOSIS — N138 Other obstructive and reflux uropathy: Secondary | ICD-10-CM

## 2020-10-18 DIAGNOSIS — E785 Hyperlipidemia, unspecified: Secondary | ICD-10-CM

## 2020-10-18 DIAGNOSIS — N401 Enlarged prostate with lower urinary tract symptoms: Secondary | ICD-10-CM

## 2020-10-18 DIAGNOSIS — G4733 Obstructive sleep apnea (adult) (pediatric): Secondary | ICD-10-CM

## 2020-10-18 DIAGNOSIS — N183 Chronic kidney disease, stage 3 unspecified: Secondary | ICD-10-CM

## 2020-10-18 DIAGNOSIS — K760 Fatty (change of) liver, not elsewhere classified: Secondary | ICD-10-CM

## 2020-10-18 DIAGNOSIS — I700 Atherosclerosis of aorta: Secondary | ICD-10-CM

## 2020-10-18 DIAGNOSIS — E559 Vitamin D deficiency, unspecified: Secondary | ICD-10-CM

## 2020-10-18 DIAGNOSIS — E1169 Type 2 diabetes mellitus with other specified complication: Secondary | ICD-10-CM

## 2020-10-18 DIAGNOSIS — E66811 Obesity, class 1: Secondary | ICD-10-CM

## 2020-10-18 DIAGNOSIS — Z79899 Other long term (current) drug therapy: Secondary | ICD-10-CM

## 2020-10-18 DIAGNOSIS — E669 Obesity, unspecified: Secondary | ICD-10-CM

## 2020-10-18 DIAGNOSIS — N182 Chronic kidney disease, stage 2 (mild): Secondary | ICD-10-CM

## 2020-10-18 MED ORDER — OLMESARTAN MEDOXOMIL 40 MG PO TABS: 40.0000 mg | ORAL_TABLET | Freq: Every day | ORAL | 1 refills | Status: DC

## 2020-10-18 NOTE — Patient Instructions (Signed)
  STOP eating salty foods, it is making your blood pressure higher.  Monitor the foods you eat.  Eat foods low in sodium content and do not add salt.  Increase the amount of water you drink during the day, 64-80 ounces a day.  This is very important for your kidney function and to prevent dehydration.  Check your blood pressure twice a day and record on sheet provided.  We are changing your losartan 100 to Olmesartan '40mg'$ .  Take this every morning before you start work.  Take the Atenolol before you go to sleep.  We will contact you via MyChart with your lab results.   GENERAL HEALTH GOALS  Know what a healthy weight is for you (roughly BMI <25) and aim to maintain this  Aim for 7+ servings of fruits and vegetables daily  70-80+ fluid ounces of water or unsweet tea for healthy kidneys  Limit to max 1 drink of alcohol per day; avoid smoking/tobacco  Limit animal fats in diet for cholesterol and heart health - choose grass fed whenever available  Avoid highly processed foods, and foods high in saturated/trans fats  Aim for low stress - take time to unwind and care for your mental health  Aim for 150 min of moderate intensity exercise weekly for heart health, and weights twice weekly for bone health  Aim for 7-9 hours of sleep daily

## 2020-10-19 LAB — CBC WITH DIFFERENTIAL/PLATELET
Absolute Monocytes: 574 cells/uL (ref 200–950)
Basophils Absolute: 92 cells/uL (ref 0–200)
Basophils Relative: 1.4 %
Eosinophils Absolute: 191 cells/uL (ref 15–500)
Eosinophils Relative: 2.9 %
HCT: 45.5 % (ref 38.5–50.0)
Hemoglobin: 14.7 g/dL (ref 13.2–17.1)
Lymphs Abs: 1927 cells/uL (ref 850–3900)
MCH: 26.9 pg — ABNORMAL LOW (ref 27.0–33.0)
MCHC: 32.3 g/dL (ref 32.0–36.0)
MCV: 83.2 fL (ref 80.0–100.0)
MPV: 9.9 fL (ref 7.5–12.5)
Monocytes Relative: 8.7 %
Neutro Abs: 3815 cells/uL (ref 1500–7800)
Neutrophils Relative %: 57.8 %
Platelets: 193 10*3/uL (ref 140–400)
RBC: 5.47 10*6/uL (ref 4.20–5.80)
RDW: 14 % (ref 11.0–15.0)
Total Lymphocyte: 29.2 %
WBC: 6.6 10*3/uL (ref 3.8–10.8)

## 2020-10-19 LAB — COMPLETE METABOLIC PANEL WITH GFR
AG Ratio: 1.5 (calc) (ref 1.0–2.5)
ALT: 10 U/L (ref 9–46)
AST: 10 U/L (ref 10–35)
Albumin: 3.8 g/dL (ref 3.6–5.1)
Alkaline phosphatase (APISO): 70 U/L (ref 35–144)
BUN: 17 mg/dL (ref 7–25)
CO2: 30 mmol/L (ref 20–32)
Calcium: 9.3 mg/dL (ref 8.6–10.3)
Chloride: 100 mmol/L (ref 98–110)
Creat: 0.99 mg/dL (ref 0.70–1.18)
GFR, Est African American: 87 mL/min/{1.73_m2} (ref >=60)
GFR, Est Non African American: 75 mL/min/{1.73_m2} (ref >=60)
Globulin: 2.5 g/dL (calc) (ref 1.9–3.7)
Glucose, Bld: 158 mg/dL — ABNORMAL HIGH (ref 65–99)
Potassium: 4.6 mmol/L (ref 3.5–5.3)
Sodium: 140 mmol/L (ref 135–146)
Total Bilirubin: 0.6 mg/dL (ref 0.2–1.2)
Total Protein: 6.3 g/dL (ref 6.1–8.1)

## 2020-10-19 LAB — LIPID PANEL
Cholesterol: 157 mg/dL (ref <200)
HDL: 42 mg/dL (ref >=40)
LDL Cholesterol (Calc): 84 mg/dL (calc)
Non-HDL Cholesterol (Calc): 115 mg/dL (calc) (ref <130)
Total CHOL/HDL Ratio: 3.7 (calc) (ref <5.0)
Triglycerides: 217 mg/dL — ABNORMAL HIGH (ref <150)

## 2020-10-19 LAB — HEMOGLOBIN A1C
Hgb A1c MFr Bld: 7.2 % of total Hgb — ABNORMAL HIGH (ref <5.7)
Mean Plasma Glucose: 160 mg/dL
eAG (mmol/L): 8.9 mmol/L

## 2020-10-21 NOTE — Progress Notes (Signed)
DUE TO COVID-19 ONLY ONE VISITOR IS ALLOWED TO COME WITH YOU AND STAY IN THE WAITING ROOM ONLY DURING PRE OP AND PROCEDURE DAY OF SURGERY. THE 1 VISITOR  MAY VISIT WITH YOU AFTER SURGERY IN YOUR PRIVATE ROOM DURING VISITING HOURS ONLY!  YOU NEED TO HAVE A COVID 19 TEST ON_4/7/22______ '@_______'$ , THIS TEST MUST BE DONE BEFORE SURGERY,  COVID TESTING SITE [address] [place] [address], IT IS ON THE RIGHT GOING OUT [address] APPROXIMATELY  2 MINUTES PAST [employer] ON THE RIGHT. ONCE YOUR COVID TEST IS COMPLETED,  PLEASE BEGIN THE QUARANTINE INSTRUCTIONS AS OUTLINED IN YOUR HANDOUT.                Phillip Powell  10/21/2020   Your procedure is scheduled on:  11/01/20  Report to [facility] Main  Entrance   Report to admitting at    1000 AM     Call this number if you have problems the morning of surgery [phone]    REMEMBER: NO  SOLID FOOD CANDY OR GUM AFTER MIDNIGHT. CLEAR LIQUIDS UNTIL 0930am         . NOTHING BY MOUTH EXCEPT CLEAR LIQUIDS UNTIL     0930am   . PLEASE FINISH ENSURE DRINK PER SURGEON ORDER  WHICH NEEDS TO BE COMPLETED AT      .  0930am    CLEAR LIQUID DIET   Foods Allowed                                                                    Coffee and tea, regular and decaf                            Fruit ices (not with fruit pulp)                                      Iced Popsicles                                    Carbonated beverages, regular and diet                                    Cranberry, grape and apple juices Sports drinks like Gatorade Lightly seasoned clear broth or consume(fat free) Sugar, honey syrup ___________________________________________________________________      BRUSH YOUR TEETH MORNING OF SURGERY AND RINSE YOUR MOUTH OUT, NO CHEWING GUM CANDY OR MINTS.     Take these medicines the morning of surgery with A SIP OF WATER:                Atenolol DO NOT TAKE ANY DIABETIC MEDICATIONS DAY OF YOUR  SURGERY                               You may not have any metal on your body including hair pins and  piercings  Do not wear jewelry, make-up, lotions, powders or perfumes, deodorant             Do not wear nail polish on your fingernails.  Do not shave  48 hours prior to surgery.              Men may shave face and neck.   Do not bring valuables to the hospital. [place].  Contacts, dentures or bridgework may not be worn into surgery.  Leave suitcase in the car. After surgery it may be brought to your room.     Patients discharged the day of surgery will not be allowed to drive home. IF YOU ARE HAVING SURGERY AND GOING HOME THE SAME DAY, YOU MUST HAVE AN ADULT TO DRIVE YOU HOME AND BE WITH YOU FOR 24 HOURS. YOU MAY GO HOME BY TAXI OR UBER OR ORTHERWISE, BUT AN ADULT MUST ACCOMPANY YOU HOME AND STAY WITH YOU FOR 24 HOURS.  Name and phone number of your driver:  Special Instructions: N/A              Please read over the following fact sheets you were given: _____________________________________________________________________  [facility] - Preparing for Surgery Before surgery, you can play an important role.  Because skin is not sterile, your skin needs to be as free of germs as possible.  You can reduce the number of germs on your skin by washing with CHG (chlorahexidine gluconate) soap before surgery.  CHG is an antiseptic cleaner which kills germs and bonds with the skin to continue killing germs even after washing. Please DO NOT use if you have an allergy to CHG or antibacterial soaps.  If your skin becomes reddened/irritated stop using the CHG and inform your nurse when you arrive at Short Stay. Do not shave (including legs and underarms) for at least 48 hours prior to the first CHG shower.  You may shave your face/neck. Please follow these instructions carefully:  1.  Shower with CHG Soap the night before surgery and the   morning of Surgery.  2.  If you choose to wash your hair, wash your hair first as usual with your  normal  shampoo.  3.  After you shampoo, rinse your hair and body thoroughly to remove the  shampoo.                           4.  Use CHG as you would any other liquid soap.  You can apply chg directly  to the skin and wash                       Gently with a scrungie or clean washcloth.  5.  Apply the CHG Soap to your body ONLY FROM THE NECK DOWN.   Do not use on face/ open                           Wound or open sores. Avoid contact with eyes, ears mouth and genitals (private parts).                       Wash face,  Genitals (private parts) with your normal soap.             6.  Wash thoroughly, paying special attention to the area where your surgery  will be performed.  7.  Thoroughly rinse your body with warm water from the neck down.  8.  DO NOT shower/wash with your normal soap after using and rinsing off  the CHG Soap.                9.  Pat yourself dry with a clean towel.            10.  Wear clean pajamas.            11.  Place clean sheets on your bed the night of your first shower and do not  sleep with pets. Day of Surgery : Do not apply any lotions/deodorants the morning of surgery.  Please wear clean clothes to the hospital/surgery center.  FAILURE TO FOLLOW THESE INSTRUCTIONS MAY RESULT IN THE CANCELLATION OF YOUR SURGERY PATIENT SIGNATURE_________________________________  NURSE SIGNATURE__________________________________  ________________________________________________________________________

## 2020-10-25 ENCOUNTER — Other Ambulatory Visit: Payer: Self-pay

## 2020-10-25 ENCOUNTER — Encounter (HOSPITAL_COMMUNITY)
Admission: RE | Admit: 2020-10-25 | Discharge: 2020-10-25 | Disposition: A | Discharge status: Home / Self Care | Payer: Managed Care, Other (non HMO) | Source: Ambulatory Visit | Attending: Orthopedic Surgery | Admitting: Orthopedic Surgery

## 2020-10-25 ENCOUNTER — Encounter (HOSPITAL_COMMUNITY): Payer: Self-pay

## 2020-10-25 DIAGNOSIS — Z01812 Encounter for preprocedural laboratory examination: Secondary | ICD-10-CM | POA: Insufficient documentation

## 2020-10-25 HISTORY — DX: Unspecified osteoarthritis, unspecified site: M19.90

## 2020-10-25 LAB — COMPREHENSIVE METABOLIC PANEL
ALT: 15 U/L (ref 0–44)
AST: 15 U/L (ref 15–41)
Albumin: 3.9 g/dL (ref 3.5–5.0)
Alkaline Phosphatase: 68 U/L (ref 38–126)
Anion gap: 10 (ref 5–15)
BUN: 16 mg/dL (ref 8–23)
CO2: 25 mmol/L (ref 22–32)
Calcium: 9.2 mg/dL (ref 8.9–10.3)
Chloride: 103 mmol/L (ref 98–111)
Creatinine, Ser: 1.06 mg/dL (ref 0.61–1.24)
GFR, Estimated: >60 mL/min (ref >60)
Glucose, Bld: 142 mg/dL — ABNORMAL HIGH (ref 70–99)
Potassium: 3.9 mmol/L (ref 3.5–5.1)
Sodium: 138 mmol/L (ref 135–145)
Total Bilirubin: 0.7 mg/dL (ref 0.3–1.2)
Total Protein: 6.9 g/dL (ref 6.5–8.1)

## 2020-10-25 LAB — CBC
HCT: 44.9 % (ref 39.0–52.0)
Hemoglobin: 14.8 g/dL (ref 13.0–17.0)
MCH: 27 pg (ref 26.0–34.0)
MCHC: 33 g/dL (ref 30.0–36.0)
MCV: 81.9 fL (ref 80.0–100.0)
Platelets: 183 10*3/uL (ref 150–400)
RBC: 5.48 MIL/uL (ref 4.22–5.81)
RDW: 13.9 % (ref 11.5–15.5)
WBC: 7.3 10*3/uL (ref 4.0–10.5)
nRBC: 0 % (ref 0.0–0.2)

## 2020-10-25 LAB — SURGICAL PCR SCREEN
MRSA, PCR: NEGATIVE
Staphylococcus aureus: NEGATIVE

## 2020-10-25 LAB — APTT: aPTT: 28 seconds (ref 24–36)

## 2020-10-25 LAB — PROTIME-INR
INR: 1 (ref 0.8–1.2)
Prothrombin Time: 13 seconds (ref 11.4–15.2)

## 2020-10-25 LAB — GLUCOSE, CAPILLARY: Glucose-Capillary: 149 mg/dL — ABNORMAL HIGH (ref 70–99)

## 2020-10-25 NOTE — Progress Notes (Addendum)
Anesthesia Review:  PCP: DR Melford Aase - Clearance on chart dated 08/31/20 with LOV 07/08/20 on chart  DR Posey Pronto- Nephrology- clearance on chart dated 08/09/20 with instructions  Cardiologist : Chest x-ray : EKG : 07/22/20 on chart ane in epic   Echo : Stress test: Cardiac Cath :  Activity level: can do a flight of stairs without difficulty  Sleep Study/ CPAP :only uses 4 hours per nite  Fasting Blood Sugar :      / Checks Blood Sugar -- times a day:   Blood Thinner/ Instructions /Last Dose: ASA / Instructions/ Last Dose :  DM- checks glucose twice daily  hgba1c-10/18/20-7.2 in epic  PT came in at preop and stated he was supposed to hold Losartan and metformin for 2 days prior to surgery.  Instructed pt to follow whatever preop instructions he had been given by MD.  After preop appt found clearance by Dr Elmarie Shiley- Nephrology stating to hold Losartan and Metformin prior to surgery and NO NSAIDS.  Called pt and LVMM and asked him to call me.  Called again and spoke with wife Wilma Dalia and informed wife that I had noted clearance stating to hold Losartan and Metformin 48 hours prior to surgery .  Pt is now on Benicar.  Informed wife to call Dr Posey Pronto and ask him did he want pt to hold Benicar( Olmesartan) 48 hours prior to surgery since medications now changed.  Wife voiced understanding and stated she would call Dr Posey Pronto to verify.

## 2020-10-26 NOTE — Progress Notes (Addendum)
Wife, Roman Ruether called and LVMM asking in regards to Trulicity prior to surgery and NSAIDS prior to surgery.  Called and spoke wifh wife briefly.  Phone cut off.  Wife did state over phone that husband had stated he would do usual dose of Trulicity on Tuesday of this week.  LVMM for wife to call back.  Wife called back and again informed wife that pt could do usual dose of Trulicity on 0000000  Since pt takes on tuesdays.  Wife voiced  understanding.  Wife asked in regards to NSAIDS.  Informed wife that NSAID instructions  would come from either surgeon office or DR Posey Pronto office.  Informed that nurses in preop only instruct as to what meds to take am of surgery.  Any starting or stopping of meds would come from Patients medical doctors.  Wife voiced understanding.  Asked wife if she had received clarificatoin regardinig Benicar.  Wife stated she had call in to MD but had not heard back yet.  Instgructed wife to call office again today.  Wife voiced understanding.

## 2020-10-26 NOTE — Progress Notes (Signed)
Spoke with pt's wife , Sriharsha Mcmaken again this pm.  Wife stated she spoke with Dr Serita Grit office and pt is to  Stop Aspirin on 10/24/20  Olmesartan- to stop on 10/30/20 Metformin- to stop on 10/30/20 Arava- To stop on 10/26/20 Per instructions of DR Posey Pronto.   Pt to bring in on day of surgery sheet of paper with dates he stopped medications .  His wife will be sending in paper with him since she states she keeps track of all medications.

## 2020-10-27 NOTE — H&P (Signed)
TOTAL KNEE ADMISSION H&P  Patient is being admitted for left total knee arthroplasty.  Subjective:  Chief Complaint: Left knee pain.  HPI: Phillip Powell, 75 y.o. male has a history of pain and functional disability in the left knee due to arthritis and has failed non-surgical conservative treatments for greater than 12 weeks to include corticosteriod injections, viscosupplementation injections and activity modification. Onset of symptoms was gradual, starting several years ago with gradually worsening course since that time. The patient noted no past surgery on the left knee.  Patient currently rates pain in the left knee at 8 out of 10 with activity. Patient has night pain, worsening of pain with activity and weight bearing, pain that interferes with activities of daily living and crepitus. Patient has evidence of periarticular osteophytes and joint space narrowing by imaging studies. There is no active infection.  Patient Active Problem List   Diagnosis Date Noted  . Aortic atherosclerosis ([place]) by Abd CT scan on 09/19/2019 07/10/2020  . Renal calculi   . Hepatic steatosis   . Hydroureteronephrosis   . AKI (acute kidney injury) ([place]) 02/12/2019  . CKD stage 3 due to type 2 diabetes mellitus ([place]) 06/10/2017  . Gluttony 11/26/2016  . Poor compliance with Diet  10/05/2014  . Obesity (BMI 30.0-34.9) 03/02/2014  . Hyperlipidemia associated with type 2 diabetes mellitus ([place]) 08/18/2013  . Medication management 08/18/2013  . Hypertension   . Type 2 diabetes mellitus ([place])   . GERD (gastroesophageal reflux disease)   . Testosterone Deficiency   . OSA (obstructive sleep apnea)   . Vitamin D deficiency   . BPH (benign prostatic hyperplasia)     Past Medical History:  Diagnosis Date  .  Covid-19 Positive (02/06/2019)  02/11/2019  . Arthritis   . BPH (benign prostatic hypertrophy)   . Diabetes mellitus without complication ([place])    type w   . GERD (gastroesophageal reflux disease)    . Hepatic steatosis   . History of kidney stones   . Hydroureteronephrosis   . Hypertension   . Hypogonadism male   . Mass    left shoulder AC joint  . OSA (obstructive sleep apnea)    wears CPAP wears 4 hours per nite   . PONV (postoperative nausea and vomiting)   . Renal calculi   . Sarcoidosis   . Vitamin D deficiency     Past Surgical History:  Procedure Laterality Date  . APPENDECTOMY    . COLONOSCOPY    . CYSTOSCOPY/URETEROSCOPY/HOLMIUM LASER/STENT PLACEMENT Right 10/01/2019   Procedure: CYSTOSCOPY/RETROGRADE/URETEROSCOPY/HOLMIUM LASER/STENT PLACEMENT;  Surgeon: Ceasar Mons, MD;  Location: [facility];  Service: Urology;  Laterality: Right;  ONLY NEEDS 45 MIN  . HAND SURGERY Right 2019   Dr. Amedeo Plenty, tendon repair of R thumb  . HERNIA REPAIR  1610   umbilical  . KNEE ARTHROSCOPY    . ORIF CLAVICULAR FRACTURE Left 03/29/2018   Procedure: LEFT SHOULDER OPEN DISTAL CLAVICLE RESECTION, CYST EXCISION;  Surgeon: Netta Cedars, MD;  Location: [facility];  Service: Orthopedics;  Laterality: Left;  . ROTATOR CUFF REPAIR Left 1996  . ROTATOR CUFF REPAIR Right 1997  . TONSILLECTOMY  1952    Prior to Admission medications   Medication Sig Start Date End Date Taking? Authorizing Provider  acetaminophen (TYLENOL) 500 MG tablet Take 1,000 mg by mouth every 6 (six) hours as needed for moderate pain.   Yes [provider]  aspirin EC 81 MG tablet Take 81 mg by mouth daily.  Yes [provider]  atenolol (TENORMIN) 100 MG tablet Take       1 tablet       Daily       for BP Patient taking differently: Take 100 mg by mouth daily. 06/02/20  Yes Unk Pinto, MD  Cholecalciferol (VITAMIN D3) 2000 units TABS Take 2,000 Units by mouth daily.   Yes [provider]  Cinnamon 500 MG TABS Take 500 mg by mouth daily.   Yes [provider]  Dulaglutide (TRULICITY) 1.5 FE/0.7HQ SOPN Inject 1.5 mg into the skin once a week. Patient taking differently: Inject  1.5 mg into the skin once a week. Takes on Tuesdays 08/16/20  Yes Unk Pinto, MD  famotidine (PEPCID) 40 MG tablet Take     1 tablet      at Bedtime       for Indigestion & Acid Reflux Patient taking differently: Take 40 mg by mouth daily. 05/12/20  Yes Unk Pinto, MD  glyBURIDE (DIABETA) 5 MG tablet Take 1/2 to 1 tablet     3 x /day       with Meals for Diabetes Patient taking differently: Take 5 mg by mouth daily as needed (blood sugar above 200). 07/22/20  Yes Unk Pinto, MD  leflunomide (ARAVA) 10 MG tablet Take 10 mg by mouth daily.   Yes [provider]  magnesium gluconate (MAGONATE) 500 MG tablet Take 500 mg by mouth daily.   Yes [provider]  meclizine (ANTIVERT) 25 MG tablet Take 25 mg by mouth 3 (three) times daily as needed for dizziness.   Yes [provider]  metFORMIN (GLUCOPHAGE XR) 500 MG 24 hr tablet Take     1 to 2 tablets       2 x /day        with Meals      for Diabetes Patient taking differently: Take 500 mg by mouth daily with breakfast. 05/11/20  Yes Unk Pinto, MD  tamsulosin (FLOMAX) 0.4 MG CAPS capsule Take      1 tablet       at Bedtime        for Prostate Patient taking differently: Take 0.4 mg by mouth at bedtime. 07/08/20  Yes Unk Pinto, MD  vitamin B-12 (CYANOCOBALAMIN) 1000 MCG tablet Take 1,000 mcg by mouth daily.   Yes [provider]  zinc gluconate 50 MG tablet Take 50 mg by mouth daily.   Yes [provider]  B-D 3CC LUER-LOK SYR 21GX1" 21G X 1" 3 ML MISC use as directed 10/17/16   Unk Pinto, MD  Blood Glucose Monitoring Suppl (ONETOUCH VERIO) w/Device KIT Test blood sugar once daily. 03/03/19   Liane Comber, NP  Blood Glucose Monitoring Suppl DEVI Use meter three times a day to check blood sugar 05/20/18   Liane Comber, NP  gabapentin (NEURONTIN) 100 MG capsule Take 1 to 2 capsules 2 to 3 x  /day as needed for Chronic Pain Patient taking differently: Take 1 to 2 capsules  2 to 3 x  /day as needed for Chronic Pain 10/14/19   Unk Pinto, MD  glucose blood [facility] VERIO) test strip Test sugars 3 x a day for hypoglycemia/hyperglycemia and fluctuating sugars 11/10/19   Vicie Mutters R, PA-C  glucose blood test strip Test blood sugar three times a day 05/20/18   Liane Comber, NP  Lancets [facility] ULTRASOFT) lancets Test blood sugar once daily 03/03/19   Liane Comber, NP  olmesartan (BENICAR) 40 MG tablet  Take 1 tablet (40 mg total) by mouth daily. 10/18/20   Garnet Sierras, NP  ondansetron (ZOFRAN) 8 MG tablet Take 1/2 to 1 tablet 3 x day if needed for Nausea 02/16/20   Vladimir Crofts, PA-C  oxybutynin (DITROPAN) 5 MG tablet Take 1 tablet (5 mg total) by mouth every 8 (eight) hours as needed for bladder spasms. 10/01/19   Ceasar Mons, MD    Allergies  Allergen Reactions  . Ace Inhibitors Other (See Comments)    Unknown  . Ibuprofen Other (See Comments)    GI upset  . Invokana [Canagliflozin] Other (See Comments)    Unknown  . Metformin And Related Diarrhea  . Quinidine Other (See Comments)    Unknown    Social History   Socioeconomic History  . Marital status: Married    Spouse name: Not on file  . Number of children: Not on file  . Years of education: Not on file  . Highest education level: Not on file  Occupational History  . Not on file  Tobacco Use  . Smoking status: Never Smoker  . Smokeless tobacco: Never Used  Vaping Use  . Vaping Use: Never used  Substance and Sexual Activity  . Alcohol use: Yes    Comment: very rarely  . Drug use: Never  . Sexual activity: Not on file  Other Topics Concern  . Not on file  Social History Narrative  . Not on file   Social Determinants of Health   Financial Resource Strain: Not on file  Food Insecurity: Not on file  Transportation Needs: Not on file  Physical Activity: Not on file  Stress: Not on file  Social Connections: Not on file  Intimate Partner Violence: Not  on file    Tobacco Use: Low Risk   . Smoking Tobacco Use: Never Smoker  . Smokeless Tobacco Use: Never Used   Social History   Substance and Sexual Activity  Alcohol Use Yes   Comment: very rarely    Family History  Problem Relation Age of Onset  . Thyroid disease Father   . Cancer Father        lung  . Cancer Mother        throat  . Diabetes Mother   . Thyroid disease Sister   . Hypertension Son   . Hyperlipidemia Son   . Cancer Paternal Grandmother        colon    Review of Systems  Constitutional: Negative for chills and fever.  HENT: Negative for congestion, sore throat and tinnitus.   Eyes: Negative for double vision, photophobia and pain.  Respiratory: Negative for cough, shortness of breath and wheezing.   Cardiovascular: Negative for chest pain, palpitations and orthopnea.  Gastrointestinal: Negative for heartburn, nausea and vomiting.  Genitourinary: Negative for dysuria, frequency and urgency.  Musculoskeletal: Positive for joint pain.  Neurological: Negative for dizziness, weakness and headaches.    Objective:  Physical Exam: Well nourished and well developed. General: Alert and oriented x3, cooperative and pleasant, no acute distress. Head: normocephalic, atraumatic, neck supple. Eyes: EOMI.  Musculoskeletal: Left Knee Exam:  Significant varus deformity.  No effusion present. No swelling present.  Range of motion: 10 to 100 degrees.  Moderate crepitus on range of motion of the knee.  Medial joint line tenderness.  No lateral joint line tenderness.  The knee is stable.    Calves soft and nontender. Motor function intact in LE. Strength 5/5 LE bilaterally. Neuro: Distal pulses 2+. Sensation  to light touch intact in LE.  Imaging Review Plain radiographs demonstrate severe degenerative joint disease of the left knee. The overall alignment is significant varus. The bone quality appears to be adequate for age and reported activity  level.  Assessment/Plan:  End stage arthritis, left knee   The patient history, physical examination, clinical judgment of the provider and imaging studies are consistent with end stage degenerative joint disease of the left knee and total knee arthroplasty is deemed medically necessary. The treatment options including medical management, injection therapy arthroscopy and arthroplasty were discussed at length. The risks and benefits of total knee arthroplasty were presented and reviewed. The risks due to aseptic loosening, infection, stiffness, patella tracking problems, thromboembolic complications and other imponderables were discussed. The patient acknowledged the explanation, agreed to proceed with the plan and consent was signed. Patient is being admitted for inpatient treatment for surgery, pain control, PT, OT, prophylactic antibiotics, VTE prophylaxis, progressive ambulation and ADLs and discharge planning. The patient is planning to be discharged home.   Patient's anticipated LOS is less than 2 midnights, meeting these requirements: - Younger than 43 - Lives within 1 hour of care - Has a competent adult at home to recover with post-op recover - NO history of  - Chronic pain requiring opiods  - Diabetes  - Coronary Artery Disease  - Heart failure  - Heart attack  - Stroke  - DVT/VTE  - Cardiac arrhythmia  - Respiratory Failure/COPD  - Renal failure  - Anemia  - Advanced Liver disease  Therapy Plans: outpatient therapy at [facility] Disposition: Home with wife Planned DVT Prophylaxis: Xarelto 52m daily DME needed: walker PCP: Dr. WUnk Pinto clearance received Nephrologist: Dr. PPosey Pronto clearance received (hx of kidney stones, CKD) Rheumatologist: Dr. AMike Craze(possible reactive arthritis after COVID) TXA: IV Allergies: canagliflozin - unsure, ibuprofen - CKD, Quinidine - unsure of reaction Anesthesia Concerns: nausea BMI: 33.1 Last HgbA1c: 7.0%  Other: - Per  Nephrologist, hold metformin & losartan for 48 hours pre & post operatively - NO NSAIDs per nephrology - Reports his sister had a DVT the day after TKA, is okay with Aspirin or Xarelto  - Patient was instructed on what medications to stop prior to surgery. - Follow-up visit in 2 weeks with Dr. AWynelle Link- Begin physical therapy following surgery - Pre-operative lab work as pre-surgical testing - Prescriptions will be provided in hospital at time of discharge  KTheresa Duty PA-C Orthopedic Surgery [facility] [place]

## 2020-10-28 ENCOUNTER — Other Ambulatory Visit (HOSPITAL_COMMUNITY)
Admission: RE | Admit: 2020-10-28 | Discharge: 2020-10-28 | Disposition: A | Discharge status: Home / Self Care | Payer: Managed Care, Other (non HMO) | Source: Ambulatory Visit | Attending: Orthopedic Surgery | Admitting: Orthopedic Surgery

## 2020-10-28 DIAGNOSIS — Z01812 Encounter for preprocedural laboratory examination: Secondary | ICD-10-CM | POA: Diagnosis present

## 2020-10-28 DIAGNOSIS — U071 COVID-19: Secondary | ICD-10-CM | POA: Diagnosis not present

## 2020-10-28 LAB — SARS CORONAVIRUS 2 (TAT 6-24 HRS): SARS Coronavirus 2: POSITIVE — AB

## 2020-10-29 ENCOUNTER — Telehealth (HOSPITAL_COMMUNITY): Payer: Self-pay

## 2020-10-29 ENCOUNTER — Telehealth: Payer: Self-pay

## 2020-10-29 NOTE — Telephone Encounter (Signed)
Called to discuss with patient about COVID-19 symptoms and the use of one of the available treatments for those with mild to moderate Covid symptoms and at a high risk of hospitalization.  Pt appears to qualify for outpatient treatment due to co-morbid conditions and/or a member of an at-risk group in accordance with the FDA Emergency Use Authorization.    Symptom onset: None Vaccinated: Yes Booster? Yes Immunocompromised? No Qualifiers: HTN,DM,CKD  Pt.  Has no symptoms.  Phillip Powell

## 2020-10-29 NOTE — Telephone Encounter (Signed)
10/29/20'@9'$ :21am - Contacted Dr. Pilar Plate Aluisio's office/spoke to [place] who transferred me to [place] Chard, surgical tech(?) for surgeon - left a detailed message on her voicemail advising of patient's Positive Covid19 lab results. MBM

## 2020-11-01 LAB — TYPE AND SCREEN
ABO/RH(D): A POS
Antibody Screen: NEGATIVE

## 2020-11-02 ENCOUNTER — Other Ambulatory Visit: Payer: Self-pay | Admitting: Internal Medicine

## 2020-11-02 DIAGNOSIS — I10 Essential (primary) hypertension: Secondary | ICD-10-CM

## 2020-11-03 ENCOUNTER — Other Ambulatory Visit: Payer: Self-pay | Admitting: Internal Medicine

## 2020-11-03 MED ORDER — TRULICITY 1.5 MG/0.5ML ~~LOC~~ SOAJ: 1.5000 mg | SUBCUTANEOUS | 3 refills | Status: DC

## 2020-11-03 NOTE — Progress Notes (Signed)
Spoke with Mrs. Barris via telephone, no changes to his medical history at this time. Made aware to arrive at 11:15 AM 11/08/20 drink pre surgery drink at 10:45 AM take Atenolol the morning of surgery, verbalized understanding.

## 2020-11-08 ENCOUNTER — Telehealth: Payer: Self-pay

## 2020-11-08 ENCOUNTER — Other Ambulatory Visit: Payer: Self-pay | Admitting: Internal Medicine

## 2020-11-08 MED ORDER — AMLODIPINE BESYLATE 10 MG PO TABS: ORAL_TABLET | ORAL | 1 refills | Status: DC

## 2020-11-08 NOTE — Telephone Encounter (Signed)
Called patient back.  Spoke with his wife, [name].  She states his blood pressure is out of control since his medications were changed.  Per Dr. Wayland Denis, he sent in amlodipine (Norvasc) 10 mg to start with 1/2 tablet once a day x 5 days.  Can go up to one tablet if blood pressure is still not under control.  Patient is to continue olmesartan, and atenolol.

## 2020-11-17 NOTE — Progress Notes (Addendum)
Phillip Powell made aware to arrive at 12:15PM 11/22/2020, instruction reviewed.  Phillip Powell stated she was told  different instructions about stopping Metformin and Olmesartan, she was instructed to stop those 2 days prior. These instructions are also noted on the surgical clearance form from Dr. Serita Grit office.

## 2020-11-21 MED ORDER — BUPIVACAINE LIPOSOME 1.3 % IJ SUSP
20.0000 mL | Freq: Once | INTRAMUSCULAR | Status: DC
  Filled 2020-11-21: qty 20

## 2020-11-22 ENCOUNTER — Ambulatory Visit (HOSPITAL_COMMUNITY): Payer: Managed Care, Other (non HMO) | Admitting: Physician Assistant

## 2020-11-22 ENCOUNTER — Ambulatory Visit (HOSPITAL_COMMUNITY): Payer: Managed Care, Other (non HMO) | Admitting: Certified Registered"

## 2020-11-22 ENCOUNTER — Other Ambulatory Visit: Payer: Self-pay

## 2020-11-22 ENCOUNTER — Observation Stay (HOSPITAL_COMMUNITY)
Admission: RE | Admit: 2020-11-22 | Discharge: 2020-11-23 | Disposition: A | Discharge status: Home / Self Care | Payer: Managed Care, Other (non HMO) | Source: Ambulatory Visit | Attending: Orthopedic Surgery | Admitting: Orthopedic Surgery

## 2020-11-22 ENCOUNTER — Encounter (HOSPITAL_COMMUNITY): Payer: Self-pay | Admitting: Orthopedic Surgery

## 2020-11-22 ENCOUNTER — Encounter (HOSPITAL_COMMUNITY)
Admission: RE | Disposition: A | Discharge status: Home / Self Care | Payer: Self-pay | Source: Ambulatory Visit | Attending: Orthopedic Surgery

## 2020-11-22 DIAGNOSIS — N183 Chronic kidney disease, stage 3 unspecified: Secondary | ICD-10-CM | POA: Diagnosis not present

## 2020-11-22 DIAGNOSIS — M1712 Unilateral primary osteoarthritis, left knee: Principal | ICD-10-CM | POA: Insufficient documentation

## 2020-11-22 DIAGNOSIS — I129 Hypertensive chronic kidney disease with stage 1 through stage 4 chronic kidney disease, or unspecified chronic kidney disease: Secondary | ICD-10-CM | POA: Insufficient documentation

## 2020-11-22 DIAGNOSIS — Z7982 Long term (current) use of aspirin: Secondary | ICD-10-CM | POA: Insufficient documentation

## 2020-11-22 DIAGNOSIS — Z79899 Other long term (current) drug therapy: Secondary | ICD-10-CM | POA: Diagnosis not present

## 2020-11-22 DIAGNOSIS — Z7984 Long term (current) use of oral hypoglycemic drugs: Secondary | ICD-10-CM | POA: Diagnosis not present

## 2020-11-22 DIAGNOSIS — Z8616 Personal history of COVID-19: Secondary | ICD-10-CM | POA: Diagnosis not present

## 2020-11-22 DIAGNOSIS — M179 Osteoarthritis of knee, unspecified: Secondary | ICD-10-CM | POA: Diagnosis present

## 2020-11-22 DIAGNOSIS — M1710 Unilateral primary osteoarthritis, unspecified knee: Secondary | ICD-10-CM | POA: Diagnosis present

## 2020-11-22 DIAGNOSIS — E1122 Type 2 diabetes mellitus with diabetic chronic kidney disease: Secondary | ICD-10-CM | POA: Diagnosis not present

## 2020-11-22 HISTORY — PX: TOTAL KNEE ARTHROPLASTY: SHX125

## 2020-11-22 LAB — GLUCOSE, CAPILLARY
Glucose-Capillary: 168 mg/dL — ABNORMAL HIGH (ref 70–99)
Glucose-Capillary: 147 mg/dL — ABNORMAL HIGH (ref 70–99)
Glucose-Capillary: 258 mg/dL — ABNORMAL HIGH (ref 70–99)
Glucose-Capillary: 235 mg/dL — ABNORMAL HIGH (ref 70–99)
Glucose-Capillary: 241 mg/dL — ABNORMAL HIGH (ref 70–99)

## 2020-11-22 LAB — TYPE AND SCREEN
ABO/RH(D): A POS
Antibody Screen: NEGATIVE

## 2020-11-22 LAB — CBC
HCT: 39.1 % (ref 39.0–52.0)
Hemoglobin: 13.1 g/dL (ref 13.0–17.0)
MCH: 27.7 pg (ref 26.0–34.0)
MCHC: 33.5 g/dL (ref 30.0–36.0)
MCV: 82.7 fL (ref 80.0–100.0)
Platelets: 170 10*3/uL (ref 150–400)
RBC: 4.73 MIL/uL (ref 4.22–5.81)
RDW: 13.8 % (ref 11.5–15.5)
WBC: 6.7 10*3/uL (ref 4.0–10.5)
nRBC: 0 % (ref 0.0–0.2)

## 2020-11-22 LAB — COMPREHENSIVE METABOLIC PANEL
ALT: 14 U/L (ref 0–44)
AST: 14 U/L — ABNORMAL LOW (ref 15–41)
Albumin: 3.3 g/dL — ABNORMAL LOW (ref 3.5–5.0)
Alkaline Phosphatase: 58 U/L (ref 38–126)
Anion gap: 9 (ref 5–15)
BUN: 19 mg/dL (ref 8–23)
CO2: 25 mmol/L (ref 22–32)
Calcium: 8.5 mg/dL — ABNORMAL LOW (ref 8.9–10.3)
Chloride: 101 mmol/L (ref 98–111)
Creatinine, Ser: 0.88 mg/dL (ref 0.61–1.24)
GFR, Estimated: >60 mL/min (ref >60)
Glucose, Bld: 163 mg/dL — ABNORMAL HIGH (ref 70–99)
Potassium: 3.9 mmol/L (ref 3.5–5.1)
Sodium: 135 mmol/L (ref 135–145)
Total Bilirubin: 0.5 mg/dL (ref 0.3–1.2)
Total Protein: 5.8 g/dL — ABNORMAL LOW (ref 6.5–8.1)

## 2020-11-22 LAB — HEMOGLOBIN A1C
Hgb A1c MFr Bld: 7.2 % — ABNORMAL HIGH (ref 4.8–5.6)
Mean Plasma Glucose: 159.94 mg/dL

## 2020-11-22 LAB — PROTIME-INR
INR: 1.1 (ref 0.8–1.2)
Prothrombin Time: 13.7 seconds (ref 11.4–15.2)

## 2020-11-22 LAB — APTT: aPTT: 26 seconds (ref 24–36)

## 2020-11-22 SURGERY — ARTHROPLASTY, KNEE, TOTAL
Anesthesia: Spinal | Site: Knee | Laterality: Left

## 2020-11-22 MED ORDER — MORPHINE SULFATE (PF) 2 MG/ML IV SOLN
0.5000 mg | INTRAVENOUS | Status: DC | PRN
  Administered 2020-11-22: 1 mg via INTRAVENOUS
  Filled 2020-11-22: qty 1

## 2020-11-22 MED ORDER — ACETAMINOPHEN 500 MG PO TABS
1000.0000 mg | ORAL_TABLET | Freq: Four times a day (QID) | ORAL | Status: AC
  Administered 2020-11-22 – 2020-11-23 (×3): 1000 mg via ORAL
  Filled 2020-11-22 (×4): qty 2

## 2020-11-22 MED ORDER — SODIUM CHLORIDE 0.9 % IV SOLN: INTRAVENOUS | Status: DC

## 2020-11-22 MED ORDER — PROPOFOL 500 MG/50ML IV EMUL
INTRAVENOUS | Status: DC | PRN
  Administered 2020-11-22: 40 ug/kg/min via INTRAVENOUS

## 2020-11-22 MED ORDER — DOCUSATE SODIUM 100 MG PO CAPS
100.0000 mg | ORAL_CAPSULE | Freq: Two times a day (BID) | ORAL | Status: DC
  Administered 2020-11-22 – 2020-11-23 (×2): 100 mg via ORAL
  Filled 2020-11-22 (×2): qty 1

## 2020-11-22 MED ORDER — CHLORHEXIDINE GLUCONATE 0.12 % MT SOLN: 15.0000 mL | Freq: Once | OROMUCOSAL | Status: DC

## 2020-11-22 MED ORDER — SODIUM CHLORIDE (PF) 0.9 % IJ SOLN
INTRAMUSCULAR | Status: DC | PRN
  Administered 2020-11-22: 60 mL

## 2020-11-22 MED ORDER — METOCLOPRAMIDE HCL 5 MG PO TABS
5.0000 mg | ORAL_TABLET | Freq: Three times a day (TID) | ORAL | Status: DC | PRN
Start: 2020-11-22 — End: 2020-11-23

## 2020-11-22 MED ORDER — INSULIN ASPART 100 UNIT/ML IJ SOLN
0.0000 [IU] | Freq: Every day | INTRAMUSCULAR | Status: DC
  Administered 2020-11-22: 2 [IU] via SUBCUTANEOUS

## 2020-11-22 MED ORDER — AMLODIPINE BESYLATE 5 MG PO TABS
2.5000 mg | ORAL_TABLET | Freq: Every day | ORAL | Status: DC
  Administered 2020-11-23: 2.5 mg via ORAL
  Filled 2020-11-22: qty 1

## 2020-11-22 MED ORDER — POLYETHYLENE GLYCOL 3350 17 G PO PACK: 17.0000 g | PACK | Freq: Every day | ORAL | Status: DC | PRN

## 2020-11-22 MED ORDER — FENTANYL CITRATE (PF) 100 MCG/2ML IJ SOLN
INTRAMUSCULAR | Status: AC
  Filled 2020-11-22: qty 2

## 2020-11-22 MED ORDER — RIVAROXABAN 10 MG PO TABS
10.0000 mg | ORAL_TABLET | Freq: Every day | ORAL | Status: DC
  Administered 2020-11-23: 10 mg via ORAL
  Filled 2020-11-22: qty 1

## 2020-11-22 MED ORDER — HYDROMORPHONE HCL 1 MG/ML IJ SOLN: 0.2500 mg | INTRAMUSCULAR | Status: DC | PRN

## 2020-11-22 MED ORDER — ONDANSETRON HCL 4 MG/2ML IJ SOLN
INTRAMUSCULAR | Status: AC
  Filled 2020-11-22: qty 2

## 2020-11-22 MED ORDER — ONDANSETRON HCL 4 MG PO TABS: 4.0000 mg | ORAL_TABLET | Freq: Four times a day (QID) | ORAL | Status: DC | PRN

## 2020-11-22 MED ORDER — HYDROMORPHONE HCL 2 MG PO TABS
2.0000 mg | ORAL_TABLET | ORAL | Status: DC | PRN
  Administered 2020-11-22 – 2020-11-23 (×3): 2 mg via ORAL
  Filled 2020-11-22 (×4): qty 1

## 2020-11-22 MED ORDER — ATENOLOL 50 MG PO TABS
100.0000 mg | ORAL_TABLET | Freq: Every day | ORAL | Status: DC
  Administered 2020-11-23: 100 mg via ORAL
  Filled 2020-11-22: qty 2

## 2020-11-22 MED ORDER — DIPHENHYDRAMINE HCL 12.5 MG/5ML PO ELIX: 12.5000 mg | ORAL_SOLUTION | ORAL | Status: DC | PRN

## 2020-11-22 MED ORDER — ONDANSETRON HCL 4 MG/2ML IJ SOLN
INTRAMUSCULAR | Status: DC | PRN
  Administered 2020-11-22: 4 mg via INTRAVENOUS

## 2020-11-22 MED ORDER — MIDAZOLAM HCL 2 MG/2ML IJ SOLN
1.0000 mg | INTRAMUSCULAR | Status: DC
  Administered 2020-11-22: 1 mg via INTRAVENOUS
  Filled 2020-11-22: qty 2

## 2020-11-22 MED ORDER — FENTANYL CITRATE (PF) 100 MCG/2ML IJ SOLN
50.0000 ug | INTRAMUSCULAR | Status: DC
  Administered 2020-11-22: 50 ug via INTRAVENOUS
  Filled 2020-11-22: qty 2

## 2020-11-22 MED ORDER — METHOCARBAMOL 500 MG IVPB - SIMPLE MED
500.0000 mg | Freq: Four times a day (QID) | INTRAVENOUS | Status: DC | PRN
  Filled 2020-11-22: qty 50

## 2020-11-22 MED ORDER — STERILE WATER FOR IRRIGATION IR SOLN
Status: DC | PRN
  Administered 2020-11-22: 2000 mL

## 2020-11-22 MED ORDER — OXYCODONE HCL 5 MG PO TABS
5.0000 mg | ORAL_TABLET | Freq: Once | ORAL | Status: AC | PRN
  Administered 2020-11-22: 5 mg via ORAL

## 2020-11-22 MED ORDER — LACTATED RINGERS IV SOLN: INTRAVENOUS | Status: DC

## 2020-11-22 MED ORDER — MENTHOL 3 MG MT LOZG: 1.0000 | LOZENGE | OROMUCOSAL | Status: DC | PRN

## 2020-11-22 MED ORDER — METOCLOPRAMIDE HCL 5 MG/ML IJ SOLN
5.0000 mg | Freq: Three times a day (TID) | INTRAMUSCULAR | Status: DC | PRN
Start: 2020-11-22 — End: 2020-11-23

## 2020-11-22 MED ORDER — INSULIN ASPART 100 UNIT/ML IJ SOLN
0.0000 [IU] | Freq: Three times a day (TID) | INTRAMUSCULAR | Status: DC
  Administered 2020-11-22: 5 [IU] via SUBCUTANEOUS
  Administered 2020-11-23: 8 [IU] via SUBCUTANEOUS
  Administered 2020-11-23: 5 [IU] via SUBCUTANEOUS

## 2020-11-22 MED ORDER — PHENOL 1.4 % MT LIQD
1.0000 | OROMUCOSAL | Status: DC | PRN
Start: 2020-11-22 — End: 2020-11-23

## 2020-11-22 MED ORDER — ONDANSETRON HCL 4 MG/2ML IJ SOLN: 4.0000 mg | Freq: Once | INTRAMUSCULAR | Status: DC | PRN

## 2020-11-22 MED ORDER — SODIUM CHLORIDE (PF) 0.9 % IJ SOLN
INTRAMUSCULAR | Status: AC
  Filled 2020-11-22: qty 10

## 2020-11-22 MED ORDER — ACETAMINOPHEN 10 MG/ML IV SOLN
1000.0000 mg | Freq: Four times a day (QID) | INTRAVENOUS | Status: DC
  Administered 2020-11-22: 1000 mg via INTRAVENOUS
  Filled 2020-11-22: qty 100

## 2020-11-22 MED ORDER — TRAMADOL HCL 50 MG PO TABS
50.0000 mg | ORAL_TABLET | Freq: Four times a day (QID) | ORAL | Status: DC | PRN
  Administered 2020-11-23: 100 mg via ORAL
  Filled 2020-11-22: qty 2

## 2020-11-22 MED ORDER — CEFAZOLIN SODIUM-DEXTROSE 2-4 GM/100ML-% IV SOLN
2.0000 g | Freq: Four times a day (QID) | INTRAVENOUS | Status: AC
  Administered 2020-11-22 (×2): 2 g via INTRAVENOUS
  Filled 2020-11-22 (×2): qty 100

## 2020-11-22 MED ORDER — FAMOTIDINE 20 MG PO TABS
40.0000 mg | ORAL_TABLET | Freq: Every day | ORAL | Status: DC
  Administered 2020-11-23: 40 mg via ORAL
  Filled 2020-11-22: qty 2

## 2020-11-22 MED ORDER — FENTANYL CITRATE (PF) 100 MCG/2ML IJ SOLN
INTRAMUSCULAR | Status: DC | PRN
  Administered 2020-11-22: 50 ug via INTRAVENOUS
  Administered 2020-11-22 (×2): 25 ug via INTRAVENOUS

## 2020-11-22 MED ORDER — PROPOFOL 10 MG/ML IV BOLUS
INTRAVENOUS | Status: DC | PRN
  Administered 2020-11-22: 40 mg via INTRAVENOUS
  Administered 2020-11-22: 20 mg via INTRAVENOUS

## 2020-11-22 MED ORDER — DEXAMETHASONE SODIUM PHOSPHATE 10 MG/ML IJ SOLN
INTRAMUSCULAR | Status: DC | PRN
  Administered 2020-11-22: 4 mg via INTRAVENOUS

## 2020-11-22 MED ORDER — BISACODYL 10 MG RE SUPP: 10.0000 mg | Freq: Every day | RECTAL | Status: DC | PRN

## 2020-11-22 MED ORDER — PHENYLEPHRINE HCL-NACL 10-0.9 MG/250ML-% IV SOLN
INTRAVENOUS | Status: DC | PRN
  Administered 2020-11-22: 20 ug/min via INTRAVENOUS

## 2020-11-22 MED ORDER — DEXAMETHASONE SODIUM PHOSPHATE 10 MG/ML IJ SOLN: 8.0000 mg | Freq: Once | INTRAMUSCULAR | Status: DC

## 2020-11-22 MED ORDER — MIDAZOLAM HCL 2 MG/2ML IJ SOLN
INTRAMUSCULAR | Status: AC
  Filled 2020-11-22: qty 2

## 2020-11-22 MED ORDER — CEFAZOLIN SODIUM-DEXTROSE 2-4 GM/100ML-% IV SOLN
2.0000 g | INTRAVENOUS | Status: AC
  Administered 2020-11-22: 2 g via INTRAVENOUS
  Filled 2020-11-22: qty 100

## 2020-11-22 MED ORDER — METHOCARBAMOL 500 MG PO TABS
500.0000 mg | ORAL_TABLET | Freq: Four times a day (QID) | ORAL | Status: DC | PRN
  Administered 2020-11-22: 500 mg via ORAL
  Filled 2020-11-22: qty 1

## 2020-11-22 MED ORDER — BUPIVACAINE HCL (PF) 0.5 % IJ SOLN
INTRAMUSCULAR | Status: DC | PRN
  Administered 2020-11-22: 20 mL via PERINEURAL

## 2020-11-22 MED ORDER — BUPIVACAINE IN DEXTROSE 0.75-8.25 % IT SOLN
INTRATHECAL | Status: DC | PRN
  Administered 2020-11-22: 1.6 mL via INTRATHECAL

## 2020-11-22 MED ORDER — ONDANSETRON HCL 4 MG/2ML IJ SOLN
4.0000 mg | Freq: Four times a day (QID) | INTRAMUSCULAR | Status: DC | PRN
  Administered 2020-11-22: 4 mg via INTRAVENOUS
  Filled 2020-11-22: qty 2

## 2020-11-22 MED ORDER — OXYCODONE HCL 5 MG/5ML PO SOLN: 5.0000 mg | Freq: Once | ORAL | Status: AC | PRN

## 2020-11-22 MED ORDER — GLYCOPYRROLATE 0.2 MG/ML IJ SOLN
INTRAMUSCULAR | Status: DC | PRN
  Administered 2020-11-22 (×2): .1 mg via INTRAVENOUS

## 2020-11-22 MED ORDER — POVIDONE-IODINE 10 % EX SWAB: 2.0000 "application " | Freq: Once | CUTANEOUS | Status: DC

## 2020-11-22 MED ORDER — 0.9 % SODIUM CHLORIDE (POUR BTL) OPTIME
TOPICAL | Status: DC | PRN
  Administered 2020-11-22: 1000 mL

## 2020-11-22 MED ORDER — ORAL CARE MOUTH RINSE: 15.0000 mL | Freq: Once | OROMUCOSAL | Status: DC

## 2020-11-22 MED ORDER — FLEET ENEMA 7-19 GM/118ML RE ENEM: 1.0000 | ENEMA | Freq: Once | RECTAL | Status: DC | PRN

## 2020-11-22 MED ORDER — GLYCOPYRROLATE PF 0.2 MG/ML IJ SOSY
PREFILLED_SYRINGE | INTRAMUSCULAR | Status: AC
  Filled 2020-11-22: qty 1

## 2020-11-22 MED ORDER — SODIUM CHLORIDE 0.9 % IR SOLN
Status: DC | PRN
  Administered 2020-11-22: 1000 mL

## 2020-11-22 MED ORDER — OXYCODONE HCL 5 MG PO TABS
ORAL_TABLET | ORAL | Status: AC
  Filled 2020-11-22: qty 1

## 2020-11-22 MED ORDER — BUPIVACAINE LIPOSOME 1.3 % IJ SUSP
INTRAMUSCULAR | Status: DC | PRN
Start: 2020-11-22 — End: 2020-11-22
  Administered 2020-11-22: 20 mL

## 2020-11-22 MED ORDER — TAMSULOSIN HCL 0.4 MG PO CAPS
0.4000 mg | ORAL_CAPSULE | Freq: Every day | ORAL | Status: DC
  Administered 2020-11-22: 0.4 mg via ORAL
  Filled 2020-11-22: qty 1

## 2020-11-22 MED ORDER — TRANEXAMIC ACID-NACL 1000-0.7 MG/100ML-% IV SOLN
1000.0000 mg | INTRAVENOUS | Status: AC
  Administered 2020-11-22: 1000 mg via INTRAVENOUS
  Filled 2020-11-22: qty 100

## 2020-11-22 SURGICAL SUPPLY — 55 items
ATTUNE MED DOME PAT 38 KNEE (Knees) ×1 IMPLANT
ATTUNE PS FEM LT SZ 7 CEM KNEE (Femur) ×1 IMPLANT
ATTUNE PSRP INSR SZ7 6 KNEE (Insert) ×1 IMPLANT
BAG SPEC THK2 15X12 ZIP CLS (MISCELLANEOUS) ×1
BAG ZIPLOCK 12X15 (MISCELLANEOUS) ×2 IMPLANT
BASE TIBIAL ROT PLAT SZ 7 KNEE (Knees) IMPLANT
BLADE SAG 18X100X1.27 (BLADE) ×2 IMPLANT
BLADE SAW SGTL 11.0X1.19X90.0M (BLADE) ×2 IMPLANT
BNDG ELASTIC 6X5.8 VLCR STR LF (GAUZE/BANDAGES/DRESSINGS) ×2 IMPLANT
BOWL SMART MIX CTS (DISPOSABLE) ×2 IMPLANT
BSPLAT TIB 7 CMNT ROT PLAT STR (Knees) ×1 IMPLANT
CEMENT HV SMART SET (Cement) ×4 IMPLANT
CLSR STERI-STRIP ANTIMIC 1/2X4 (GAUZE/BANDAGES/DRESSINGS) ×1 IMPLANT
COVER SURGICAL LIGHT HANDLE (MISCELLANEOUS) ×2 IMPLANT
COVER WAND RF STERILE (DRAPES) IMPLANT
CUFF TOURN SGL QUICK 34 (TOURNIQUET CUFF) ×2
CUFF TRNQT CYL 34X4.125X (TOURNIQUET CUFF) ×1 IMPLANT
DECANTER SPIKE VIAL GLASS SM (MISCELLANEOUS) ×2 IMPLANT
DRAPE U-SHAPE 47X51 STRL (DRAPES) ×2 IMPLANT
DRSG AQUACEL AG ADV 3.5X10 (GAUZE/BANDAGES/DRESSINGS) ×2 IMPLANT
DURAPREP 26ML APPLICATOR (WOUND CARE) ×2 IMPLANT
ELECT REM PT RETURN 15FT ADLT (MISCELLANEOUS) ×2 IMPLANT
GLOVE SRG 8 PF TXTR STRL LF DI (GLOVE) ×1 IMPLANT
GLOVE SURG ENC MOIS LTX SZ6.5 (GLOVE) ×2 IMPLANT
GLOVE SURG ENC MOIS LTX SZ8 (GLOVE) ×4 IMPLANT
GLOVE SURG UNDER POLY LF SZ7 (GLOVE) ×2 IMPLANT
GLOVE SURG UNDER POLY LF SZ8 (GLOVE) ×2
GLOVE SURG UNDER POLY LF SZ8.5 (GLOVE) ×2 IMPLANT
GOWN STRL REUS W/TWL LRG LVL3 (GOWN DISPOSABLE) ×4 IMPLANT
GOWN STRL REUS W/TWL XL LVL3 (GOWN DISPOSABLE) ×2 IMPLANT
HANDPIECE INTERPULSE COAX TIP (DISPOSABLE) ×2
HOLDER FOLEY CATH W/STRAP (MISCELLANEOUS) IMPLANT
IMMOBILIZER KNEE 20 (SOFTGOODS) ×2
IMMOBILIZER KNEE 20 THIGH 36 (SOFTGOODS) ×1 IMPLANT
KIT TURNOVER KIT A (KITS) ×2 IMPLANT
MANIFOLD NEPTUNE II (INSTRUMENTS) ×2 IMPLANT
NS IRRIG 1000ML POUR BTL (IV SOLUTION) ×2 IMPLANT
PACK TOTAL KNEE CUSTOM (KITS) ×2 IMPLANT
PADDING CAST COTTON 6X4 STRL (CAST SUPPLIES) ×3 IMPLANT
PENCIL SMOKE EVACUATOR (MISCELLANEOUS) ×2 IMPLANT
PIN DRILL FIX HALF THREAD (BIT) ×1 IMPLANT
PIN STEINMAN FIXATION KNEE (PIN) ×1 IMPLANT
PROTECTOR NERVE ULNAR (MISCELLANEOUS) ×2 IMPLANT
SET HNDPC FAN SPRY TIP SCT (DISPOSABLE) ×1 IMPLANT
STRIP CLOSURE SKIN 1/2X4 (GAUZE/BANDAGES/DRESSINGS) ×4 IMPLANT
SUT MNCRL AB 4-0 PS2 18 (SUTURE) ×2 IMPLANT
SUT STRATAFIX 0 PDS 27 VIOLET (SUTURE) ×2
SUT VIC AB 2-0 CT1 27 (SUTURE) ×6
SUT VIC AB 2-0 CT1 TAPERPNT 27 (SUTURE) ×3 IMPLANT
SUTURE STRATFX 0 PDS 27 VIOLET (SUTURE) ×1 IMPLANT
TIBIAL BASE ROT PLAT SZ 7 KNEE (Knees) ×2 IMPLANT
TRAY FOLEY MTR SLVR 16FR STAT (SET/KITS/TRAYS/PACK) ×2 IMPLANT
TUBE SUCTION HIGH CAP CLEAR NV (SUCTIONS) ×2 IMPLANT
WATER STERILE IRR 1000ML POUR (IV SOLUTION) ×4 IMPLANT
WRAP KNEE MAXI GEL POST OP (GAUZE/BANDAGES/DRESSINGS) ×2 IMPLANT

## 2020-11-22 NOTE — Anesthesia Procedure Notes (Signed)
Anesthesia Procedure Image    

## 2020-11-22 NOTE — Anesthesia Preprocedure Evaluation (Signed)
Anesthesia Evaluation  Patient identified by MRN, date of birth, ID band Patient awake    Reviewed: Allergy & Precautions, NPO status , Patient's Chart, lab work & pertinent test results  Airway Mallampati: II  TM Distance: >3 FB Neck ROM: Full    Dental no notable dental hx.    Pulmonary sleep apnea and Continuous Positive Airway Pressure Ventilation ,    Pulmonary exam normal breath sounds clear to auscultation       Cardiovascular hypertension, Normal cardiovascular exam Rhythm:Regular Rate:Normal     Neuro/Psych negative neurological ROS  negative psych ROS   GI/Hepatic Neg liver ROS, GERD  ,  Endo/Other  diabetes  Renal/GU negative Renal ROS  negative genitourinary   Musculoskeletal negative musculoskeletal ROS (+)   Abdominal   Peds negative pediatric ROS (+)  Hematology negative hematology ROS (+)   Anesthesia Other Findings   Reproductive/Obstetrics negative OB ROS                             Anesthesia Physical Anesthesia Plan  ASA: II  Anesthesia Plan: Spinal   Post-op Pain Management:  [facility] for Post-op pain   Induction: Intravenous  PONV Risk Score and Plan: 2 and Ondansetron, Dexamethasone, Propofol infusion and Treatment may vary due to age or medical condition  Airway Management Planned: Simple Face Mask  Additional Equipment:   Intra-op Plan:   Post-operative Plan:   Informed Consent: I have reviewed the patients History and Physical, chart, labs and discussed the procedure including the risks, benefits and alternatives for the proposed anesthesia with the patient or authorized representative who has indicated his/her understanding and acceptance.     Dental advisory given  Plan Discussed with: CRNA and Surgeon  Anesthesia Plan Comments:         Anesthesia Quick Evaluation

## 2020-11-22 NOTE — Anesthesia Postprocedure Evaluation (Signed)
Anesthesia Post Note  Patient: Phillip Powell  Procedure(s) Performed: TOTAL KNEE ARTHROPLASTY (Left Knee)     Patient location during evaluation: PACU Anesthesia Type: Spinal Level of consciousness: oriented and awake and alert Pain management: pain level controlled Vital Signs Assessment: post-procedure vital signs reviewed and stable Respiratory status: spontaneous breathing, respiratory function stable and patient connected to nasal cannula oxygen Cardiovascular status: blood pressure returned to baseline and stable Postop Assessment: no headache, no backache and no apparent nausea or vomiting Anesthetic complications: no   No complications documented.  Last Vitals:  Vitals:   11/22/20 1200 11/22/20 1217  BP: (!) 163/91 (!) 163/82  Pulse:  73  Resp: 12 16  Temp: 36.6 C 36.6 C  SpO2: 100% 99%    Last Pain:  Vitals:   11/22/20 1217  TempSrc: Oral  PainSc: 8                  Annalyse Langlais S

## 2020-11-22 NOTE — Evaluation (Signed)
Physical Therapy Evaluation Patient Details Name: Phillip Powell MRN: [id number] DOB: 02-17-1946 Today's Date: 11/22/2020   History of Present Illness  L TKA 11/22/20, H/O DM, GERD,HTN,OSA, BPH sarcoidosis  Clinical Impression  The patient resting in bed, in CPM, LLE rotated out. Removed CPM. Patient mobilized to bed edge. Stood with noted decreased stability on L leg, KI in place. Attempted  A few step in place with decreased stability. Patient took small steps to recliner.  Patient should progress to Dc home soon.  Instructed patient and  Spouse to prop lateral left leg to prevent ER. Reports LLE was in some ER prior to surgery.  Pt admitted with above diagnosis.  Pt currently with functional limitations due to the deficits listed below (see PT Problem List). Pt will benefit from skilled PT to increase their independence and safety with mobility to allow discharge to the venue listed below.       Follow Up Recommendations Follow surgeon's recommendation for DC plan and follow-up therapies    Equipment Recommendations  Rolling walker with 5" wheels (states he does not have one now)    Recommendations for Other Services       Precautions / Restrictions Precautions Precautions: Fall;Knee Required Braces or Orthoses: Knee Immobilizer - Left Knee Immobilizer - Left: Discontinue once straight leg raise with < 10 degree lag Restrictions Weight Bearing Restrictions: No      Mobility  Bed Mobility Overal bed mobility: Needs Assistance Bed Mobility: Supine to Sit     Supine to sit: Min assist     General bed mobility comments: support left leg to move to floor    Transfers Overall transfer level: Needs assistance Equipment used: Rolling walker (2 wheeled) Transfers: Sit to/from [facility] Sit to Stand: Mod assist Stand pivot transfers: Mod assist       General transfer comment: attempted to take a step, Left  leg somewaht buckling, also decreased   placement when stepped  Ambulation/Gait Ambulation/Gait assistance: Mod assist Gait Distance (Feet): 4 Feet Assistive device: Rolling walker (2 wheeled) Gait Pattern/deviations: Step-to pattern     General Gait Details: leg buckling  Stairs            Wheelchair Mobility    Modified Rankin (Stroke Patients Only)       Balance Overall balance assessment: Needs assistance Sitting-balance support: Bilateral upper extremity supported;Feet supported Sitting balance-Leahy Scale: Good     Standing balance support: During functional activity;Bilateral upper extremity supported Standing balance-Leahy Scale: Poor Standing balance comment: reliant on UEs                             Pertinent Vitals/Pain Pain Assessment: Faces Pain Score: 5  Pain Location: left knee Pain Descriptors / Indicators: Discomfort Pain Intervention(s): Premedicated before session;Monitored during session;Ice applied    Home Living Family/patient expects to be discharged to:: Private residence Living Arrangements: Spouse/significant other Available Help at Discharge: Family;Available 24 hours/day Type of Home: House Home Access: Ramped entrance     Home Layout: One level Home Equipment: Bedside commode;Walker - 4 wheels      Prior Function Level of Independence: Independent               Hand Dominance   Dominant Hand: Right    Extremity/Trunk Assessment   Upper Extremity Assessment Upper Extremity Assessment: Overall WFL for tasks assessed    Lower Extremity Assessment Lower Extremity Assessment: LLE deficits/detail LLE Deficits /  Details: + SLR,       Communication   Communication: No difficulties  Cognition Arousal/Alertness: Awake/alert Behavior During Therapy: WFL for tasks assessed/performed Overall Cognitive Status: Within Functional Limits for tasks assessed                                        General Comments       Exercises     Assessment/Plan    PT Assessment Patient needs continued PT services  PT Problem List Decreased strength;Decreased mobility;Decreased safety awareness;Decreased activity tolerance;Decreased knowledge of precautions;Decreased balance;Decreased knowledge of use of DME;Pain;Impaired sensation       PT Treatment Interventions DME instruction;Therapeutic activities;Gait training;Therapeutic exercise;Patient/family education;Functional mobility training    PT Goals (Current goals can be found in the Care Plan section)  Acute Rehab PT Goals Patient Stated Goal: To go home PT Goal Formulation: With patient/family Time For Goal Achievement: 11/29/20 Potential to Achieve Goals: Good    Frequency 7X/week   Barriers to discharge        Co-evaluation               AM-PAC PT "6 Clicks" Mobility  Outcome Measure Help needed turning from your back to your side while in a flat bed without using bedrails?: A Little Help needed moving from lying on your back to sitting on the side of a flat bed without using bedrails?: A Little Help needed moving to and from a bed to a chair (including a wheelchair)?: A Lot Help needed standing up from a chair using your arms (e.g., wheelchair or bedside chair)?: A Lot Help needed to walk in hospital room?: A Lot Help needed climbing 3-5 steps with a railing? : A Lot 6 Click Score: 14    End of Session Equipment Utilized During Treatment: Gait belt Activity Tolerance: Patient tolerated treatment well Patient left: in chair;with call bell/phone within reach;with family/visitor present;with chair alarm set Nurse Communication: Mobility status PT Visit Diagnosis: Unsteadiness on feet (R26.81);Difficulty in walking, not elsewhere classified (R26.2);Pain Pain - Right/Left: Left Pain - part of body: Knee    Time: 1435-1510 PT Time Calculation (min) (ACUTE ONLY): 35 min   Charges:   PT Evaluation $PT Eval Low Complexity: 1 Low PT  Treatments $Therapeutic Activity: 8-22 mins        Tresa Endo PT Acute Rehabilitation Services Pager [phone] Office [phone]   Claretha Cooper 11/22/2020, 3:23 PM

## 2020-11-22 NOTE — Progress Notes (Signed)
Orthopedic Tech Progress Note Patient Details:  Phillip Powell 1945/12/30 [id number] Applied CPM to LLE 10 to 40 degrees. CPM Left Knee Left Knee Flexion (Degrees): 40 Left Knee Extension (Degrees): 10  Post Interventions Patient Tolerated: Well Instructions Provided: Adjustment of device  Petra Kuba 11/22/2020, 10:49 AM

## 2020-11-22 NOTE — Anesthesia Procedure Notes (Signed)
Spinal  Patient location during procedure: OR Start time: 11/22/2020 8:14 AM End time: 11/22/2020 8:18 AM Reason for block: surgical anesthesia Staffing Performed: anesthesiologist  Anesthesiologist: Myrtie Soman, MD Preanesthetic Checklist Completed: patient identified, IV checked, site marked, risks and benefits discussed, surgical consent, monitors and equipment checked, pre-op evaluation and timeout performed Spinal Block Patient position: sitting Prep: Betadine Patient monitoring: heart rate, continuous pulse ox and blood pressure Approach: midline Location: L3-4 Injection technique: single-shot Needle Needle type: Sprotte  Needle gauge: 24 G Needle length: 9 cm Assessment Sensory level: T6 Events: CSF return Additional Notes Expiration date of kit checked and confirmed. Patient tolerated procedure well, without complications.

## 2020-11-22 NOTE — Progress Notes (Signed)
Orthopedic Tech Progress Note Patient Details:  Phillip Powell [birth year] [id number] RN called for bone foam. Pt was in chair. Left at bedside.      Post Interventions Patient Tolerated: Well Instructions Provided: Adjustment of device   Petra Kuba 11/22/2020, 8:51 PM

## 2020-11-22 NOTE — Discharge Instructions (Addendum)
Gaynelle Arabian, MD Total Joint Specialist [facility] [address] [place], [place] [address] [phone]  TOTAL KNEE REPLACEMENT POSTOPERATIVE DIRECTIONS    Knee Rehabilitation, Guidelines Following Surgery  Results after knee surgery are often greatly improved when you follow the exercise, range of motion and muscle strengthening exercises prescribed by your doctor. Safety measures are also important to protect the knee from further injury. If any of these exercises cause you to have increased pain or swelling in your knee joint, decrease the amount until you are comfortable again and slowly increase them. If you have problems or questions, call your caregiver or physical therapist for advice.   MEDICATION INSTRUCTIONS Per your nephrologist, hold both your olmesartan and metformin for 48 hours following surgery.  BLOOD CLOT PREVENTION . Take a 10 mg Xarelto once a day for three weeks following surgery. Then resume one 81 mg aspirin once a day. . You may resume your vitamins/supplements once you have discontinued the Xarelto. . Do not take any NSAIDs (Advil, Aleve, Ibuprofen, Meloxicam, etc.) until you have discontinued the Xarelto.   HOME CARE INSTRUCTIONS  . Remove items at home which could result in a fall. This includes throw rugs or furniture in walking pathways.  . ICE to the affected knee as much as tolerated. Icing helps control swelling. If the swelling is well controlled you will be more comfortable and rehab easier. Continue to use ice on the knee for pain and swelling from surgery. You may notice swelling that will progress down to the foot and ankle. This is normal after surgery. Elevate the leg when you are not up walking on it.    . Continue to use the breathing machine which will help keep your temperature down. It is common for your temperature to cycle up and down following surgery, especially at night when you are not up moving around and  exerting yourself. The breathing machine keeps your lungs expanded and your temperature down. . Do not place pillow under the operative knee, focus on keeping the knee straight while resting  DIET You may resume your previous home diet once you are discharged from the hospital.  DRESSING / [place] / SHOWERING . Keep your bulky bandage on for 2 days. On the third post-operative day you may remove the Ace bandage and gauze. There is a waterproof adhesive bandage on your skin which will stay in place until your first follow-up appointment. Once you remove this you will not need to place another bandage . You may begin showering 3 days following surgery, but do not submerge the incision under water.  ACTIVITY For the first 5 days, the key is rest and control of pain and swelling . Do your home exercises twice a day starting on post-operative day 3. On the days you go to physical therapy, just do the home exercises once that day. . You should rest, ice and elevate the leg for 50 minutes out of every hour. Get up and walk/stretch for 10 minutes per hour. After 5 days you can increase your activity slowly as tolerated. . Walk with your walker as instructed. Use the walker until you are comfortable transitioning to a cane. Walk with the cane in the opposite hand of the operative leg. You may discontinue the cane once you are comfortable and walking steadily. . Avoid periods of inactivity such as sitting longer than an hour when not asleep. This helps prevent blood clots.  . You may discontinue the  knee immobilizer once you are able to perform a straight leg raise while lying down. . You may resume a sexual relationship in one month or when given the OK by your doctor.  . You may return to work once you are cleared by your doctor.  . Do not drive a car for 6 weeks or until released by your surgeon.  . Do not drive while taking narcotics.  TED HOSE STOCKINGS Wear the elastic stockings on both legs  for three weeks following surgery during the day. You may remove them at night for sleeping.  WEIGHT BEARING Weight bearing as tolerated with assist device (walker, cane, etc) as directed, use it as long as suggested by your surgeon or therapist, typically at least 4-6 weeks.  POSTOPERATIVE CONSTIPATION PROTOCOL Constipation - defined medically as fewer than three stools per week and severe constipation as less than one stool per week.  One of the most common issues patients have following surgery is constipation.  Even if you have a regular bowel pattern at home, your normal regimen is likely to be disrupted due to multiple reasons following surgery.  Combination of anesthesia, postoperative narcotics, change in appetite and fluid intake all can affect your bowels.  In order to avoid complications following surgery, here are some recommendations in order to help you during your recovery period.  . Colace (docusate) - Pick up an over-the-counter form of Colace or another stool softener and take twice a day as long as you are requiring postoperative pain medications.  Take with a full glass of water daily.  If you experience loose stools or diarrhea, hold the colace until you stool forms back up. If your symptoms do not get better within 1 week or if they get worse, check with your doctor. . Dulcolax (bisacodyl) - Pick up over-the-counter and take as directed by the product packaging as needed to assist with the movement of your bowels.  Take with a full glass of water.  Use this product as needed if not relieved by Colace only.  . MiraLax (polyethylene glycol) - Pick up over-the-counter to have on hand. MiraLax is a solution that will increase the amount of water in your bowels to assist with bowel movements.  Take as directed and can mix with a glass of water, juice, soda, coffee, or tea. Take if you go more than two days without a movement. Do not use MiraLax more than once per day. Call your doctor if  you are still constipated or irregular after using this medication for 7 days in a row.  If you continue to have problems with postoperative constipation, please contact the office for further assistance and recommendations.  If you experience "the worst abdominal pain ever" or develop nausea or vomiting, please contact the office immediatly for further recommendations for treatment.  ITCHING If you experience itching with your medications, try taking only a single pain pill, or even half a pain pill at a time.  You can also use Benadryl over the counter for itching or also to help with sleep.   MEDICATIONS See your medication summary on the "After Visit Summary" that the nursing staff will review with you prior to discharge.  You may have some home medications which will be placed on hold until you complete the course of blood thinner medication.  It is important for you to complete the blood thinner medication as prescribed by your surgeon.  Continue your approved medications as instructed at time of discharge.  PRECAUTIONS . If you experience chest pain or shortness of breath - call 911 immediately for transfer to the hospital emergency department.  . If you develop a fever greater that 101 F, purulent drainage from wound, increased redness or drainage from wound, foul odor from the wound/dressing, or calf pain - CONTACT YOUR SURGEON.                                                   FOLLOW-UP APPOINTMENTS Make sure you keep all of your appointments after your operation with your surgeon and caregivers. You should call the office at the above phone number and make an appointment for approximately two weeks after the date of your surgery or on the date instructed by your surgeon outlined in the "After Visit Summary".  RANGE OF MOTION AND STRENGTHENING EXERCISES  Rehabilitation of the knee is important following a knee injury or an operation. After just a few days of immobilization, the muscles of  the thigh which control the knee become weakened and shrink (atrophy). Knee exercises are designed to build up the tone and strength of the thigh muscles and to improve knee motion. Often times heat used for twenty to thirty minutes before working out will loosen up your tissues and help with improving the range of motion but do not use heat for the first two weeks following surgery. These exercises can be done on a training (exercise) mat, on the floor, on a table or on a bed. Use what ever works the best and is most comfortable for you Knee exercises include:  . Leg Lifts - While your knee is still immobilized in a splint or cast, you can do straight leg raises. Lift the leg to 60 degrees, hold for 3 sec, and slowly lower the leg. Repeat 10-20 times 2-3 times daily. Perform this exercise against resistance later as your knee gets better.  Javier Docker and Hamstring Sets - Tighten up the muscle on the front of the thigh (Quad) and hold for 5-10 sec. Repeat this 10-20 times hourly. Hamstring sets are done by pushing the foot backward against an object and holding for 5-10 sec. Repeat as with quad sets.   Leg Slides: Lying on your back, slowly slide your foot toward your buttocks, bending your knee up off the floor (only go as far as is comfortable). Then slowly slide your foot back down until your leg is flat on the floor again.  Angel Wings: Lying on your back spread your legs to the side as far apart as you can without causing discomfort.  A rehabilitation program following serious knee injuries can speed recovery and prevent re-injury in the future due to weakened muscles. Contact your doctor or a physical therapist for more information on knee rehabilitation.   POST-OPERATIVE OPIOID TAPER INSTRUCTIONS: . It is important to wean off of your opioid medication as soon as possible. If you do not need pain medication after your surgery it is ok to stop day one. Marland Kitchen Opioids include: o Codeine, Hydrocodone(Norco,  Vicodin), Oxycodone(Percocet, oxycontin) and hydromorphone amongst others.  . Long term and even short term use of opiods can cause: o Increased pain response o Dependence o Constipation o Depression o Respiratory depression o And more.  . Withdrawal symptoms can include o Flu like symptoms o Nausea, vomiting o And more . Techniques  to manage these symptoms o Hydrate well o Eat regular healthy meals o Stay active o Use relaxation techniques(deep breathing, meditating, yoga) . Do Not substitute Alcohol to help with tapering . If you have been on opioids for less than two weeks and do not have pain than it is ok to stop all together.  . Plan to wean off of opioids o This plan should start within one week post op of your joint replacement. o Maintain the same interval or time between taking each dose and first decrease the dose.  o Cut the total daily intake of opioids by one tablet each day o Next start to increase the time between doses. o The last dose that should be eliminated is the evening dose.     IF YOU ARE TRANSFERRED TO A SKILLED REHAB FACILITY If the patient is transferred to a skilled rehab facility following release from the hospital, a list of the current medications will be sent to the facility for the patient to continue.  When discharged from the skilled rehab facility, please have the facility set up the patient's [place] prior to being released. Also, the skilled facility will be responsible for providing the patient with their medications at time of release from the facility to include their pain medication, the muscle relaxants, and their blood thinner medication. If the patient is still at the rehab facility at time of the two week follow up appointment, the skilled rehab facility will also need to assist the patient in arranging follow up appointment in our office and any transportation needs.  MAKE SURE YOU:  . Understand these instructions.   . Get help right away if you are not doing well or get worse.   DENTAL ANTIBIOTICS:  In most cases prophylactic antibiotics for Dental procdeures after total joint surgery are not necessary.  Exceptions are as follows:  1. History of prior total joint infection  2. Severely immunocompromised (Organ Transplant, cancer chemotherapy, Rheumatoid biologic meds such as [place])  3. Poorly controlled diabetes (A1C &gt; 8.0, blood glucose over 200)  If you have one of these conditions, contact your surgeon for an antibiotic prescription, prior to your dental procedure.    Pick up stool softner and laxative for home use following surgery while on pain medications. Do not submerge incision under water. Please use good hand washing techniques while changing dressing each day. May shower starting three days after surgery. Please use a clean towel to pat the incision dry following showers. Continue to use ice for pain and swelling after surgery. Do not use any lotions or creams on the incision until instructed by your surgeon.  Information on my medicine - XARELTO (Rivaroxaban)  Why was Xarelto prescribed for you? Xarelto was prescribed for you to reduce the risk of blood clots forming after orthopedic surgery. The medical term for these abnormal blood clots is venous thromboembolism (VTE).  What do you need to know about xarelto ? Take your Xarelto ONCE DAILY at the same time every day. You may take it either with or without food.  If you have difficulty swallowing the tablet whole, you may crush it and mix in applesauce just prior to taking your dose.  Take Xarelto exactly as prescribed by your doctor and DO NOT stop taking Xarelto without talking to the doctor who prescribed the medication.  Stopping without other VTE prevention medication to take the place of Xarelto may increase your risk of developing a clot.  After discharge,  you should have regular check-up appointments  with your healthcare provider that is prescribing your Xarelto.    What do you do if you miss a dose? If you miss a dose, take it as soon as you remember on the same day then continue your regularly scheduled once daily regimen the next day. Do not take two doses of Xarelto on the same day.   Important Safety Information A possible side effect of Xarelto is bleeding. You should call your healthcare provider right away if you experience any of the following: ? Bleeding from an injury or your nose that does not stop. ? Unusual colored urine (red or dark brown) or unusual colored stools (red or black). ? Unusual bruising for unknown reasons. ? A serious fall or if you hit your head (even if there is no bleeding).  Some medicines may interact with Xarelto and might increase your risk of bleeding while on Xarelto. To help avoid this, consult your healthcare provider or pharmacist prior to using any new prescription or non-prescription medications, including herbals, vitamins, non-steroidal anti-inflammatory drugs (NSAIDs) and supplements.  This website has more information on Xarelto: [email or web address]

## 2020-11-22 NOTE — Transfer of Care (Signed)
Immediate Anesthesia Transfer of Care Note  Patient: Phillip Powell  Procedure(s) Performed: TOTAL KNEE ARTHROPLASTY (Left Knee)  Patient Location: PACU  Anesthesia Type:MAC combined with regional for post-op pain  Level of Consciousness: awake, alert , oriented and patient cooperative  Airway & Oxygen Therapy: Patient Spontanous Breathing and Patient connected to face mask oxygen  Post-op Assessment: Report given to RN and Post -op Vital signs reviewed and stable  Post vital signs: Reviewed and stable  Last Vitals:  Vitals Value Taken Time  BP 120/71 11/22/20 1006  Temp    Pulse 79 11/22/20 1008  Resp 19 11/22/20 1008  SpO2 97 % 11/22/20 1008  Vitals shown include unvalidated device data.  Last Pain:  Vitals:   11/22/20 0619  TempSrc:   PainSc: 0-No pain      Patients Stated Pain Goal: 4 (50/75/73 2256)  Complications: No complications documented.

## 2020-11-22 NOTE — Op Note (Signed)
OPERATIVE REPORT-TOTAL KNEE ARTHROPLASTY   Pre-operative diagnosis- Osteoarthritis  Left knee(s)  Post-operative diagnosis- Osteoarthritis Left knee(s)  Procedure-  Left  Total Knee Arthroplasty  Surgeon- Dione Plover. Nick Stults, MD  Assistant- Theresa Duty, PA-C   Anesthesia-  Adductor canal block and spinal  EBL-20 mL   Drains None  Tourniquet time-  Total Tourniquet Time Documented: Thigh (Left) - 47 minutes Total: Thigh (Left) - 47 minutes     Complications- None  Condition-PACU - hemodynamically stable.   Brief Clinical Note   Phillip Powell is a 75 y.o. year old male with end stage OA of his left knee with progressively worsening pain and dysfunction. He has constant pain, with activity and at rest and significant functional deficits with difficulties even with ADLs. He has had extensive non-op management including analgesics, injections of cortisone and viscosupplements, and home exercise program, but remains in significant pain with significant dysfunction. Radiographs show bone on bone arthritis all 3 compartments. He presents now for left Total Knee Arthroplasty.    Procedure in detail---   The patient is brought into the operating room and positioned supine on the operating table. After successful administration of  Adductor canal block and spinal,   a tourniquet is placed high on the  Left thigh(s) and the lower extremity is prepped and draped in the usual sterile fashion. Time out is performed by the operating team and then the  Left lower extremity is wrapped in Esmarch, knee flexed and the tourniquet inflated to 300 mmHg.       A midline incision is made with a ten blade through the subcutaneous tissue to the level of the extensor mechanism. A fresh blade is used to make a medial parapatellar arthrotomy. Soft tissue over the proximal medial tibia is subperiosteally elevated to the joint line with a knife and into the semimembranosus bursa with a Cobb elevator. Soft  tissue over the proximal lateral tibia is elevated with attention being paid to avoiding the patellar tendon on the tibial tubercle. The patella is everted, knee flexed 90 degrees and the ACL and PCL are removed. Findings are bone on bone all 3 compartments with massive global osteophytes.        The drill is used to create a starting hole in the distal femur and the canal is thoroughly irrigated with sterile saline to remove the fatty contents. The 5 degree Left  valgus alignment guide is placed into the femoral canal and the distal femoral cutting block is pinned to remove 9 mm off the distal femur. Resection is made with an oscillating saw.      The tibia is subluxed forward and the menisci are removed. The extramedullary alignment guide is placed referencing proximally at the medial aspect of the tibial tubercle and distally along the second metatarsal axis and tibial crest. The block is pinned to remove 36m off the more deficient medial  side. Resection is made with an oscillating saw. Size 7is the most appropriate size for the tibia and the proximal tibia is prepared with the modular drill and keel punch for that size.      The femoral sizing guide is placed and size 7 is most appropriate. Rotation is marked off the epicondylar axis and confirmed by creating a rectangular flexion gap at 90 degrees. The size 7 cutting block is pinned in this rotation and the anterior, posterior and chamfer cuts are made with the oscillating saw. The intercondylar block is then placed and that cut is made.  Trial size 7 tibial component, trial size 7 posterior stabilized femur and a 6  mm posterior stabilized rotating platform insert trial is placed. Full extension is achieved with excellent varus/valgus and anterior/posterior balance throughout full range of motion. The patella is everted and thickness measured to be 25  mm. Free hand resection is taken to 15 mm, a 38 template is placed, lug holes are drilled, trial  patella is placed, and it tracks normally. Osteophytes are removed off the posterior femur with the trial in place. All trials are removed and the cut bone surfaces prepared with pulsatile lavage. Cement is mixed and once ready for implantation, the size 7 tibial implant, size  7 posterior stabilized femoral component, and the size 38 patella are cemented in place and the patella is held with the clamp. The trial insert is placed and the knee held in full extension. The Exparel (20 ml mixed with 60 ml saline) is injected into the extensor mechanism, posterior capsule, medial and lateral gutters and subcutaneous tissues.  All extruded cement is removed and once the cement is hard the permanent 6 mm posterior stabilized rotating platform insert is placed into the tibial tray.      The wound is copiously irrigated with saline solution and the extensor mechanism closed with # 0 Stratofix suture. The tourniquet is released for a total tourniquet time of 47  minutes. Flexion against gravity is 140 degrees and the patella tracks normally. Subcutaneous tissue is closed with 2.0 vicryl and subcuticular with running 4.0 Monocryl. The incision is cleaned and dried and steri-strips and a bulky sterile dressing are applied. The limb is placed into a knee immobilizer and the patient is awakened and transported to recovery in stable condition.      Please note that a surgical assistant was a medical necessity for this procedure in order to perform it in a safe and expeditious manner. Surgical assistant was necessary to retract the ligaments and vital neurovascular structures to prevent injury to them and also necessary for proper positioning of the limb to allow for anatomic placement of the prosthesis.   Dione Plover Deoni Cosey, MD    11/22/2020, 9:37 AM

## 2020-11-22 NOTE — Progress Notes (Signed)
Assisted Dr. Myrtie Soman with Left Knee Adductor Canal  block. Side rails up, monitors on throughout procedure. See vital signs in flow sheet. Tolerated Procedure well.

## 2020-11-22 NOTE — Anesthesia Procedure Notes (Signed)
Anesthesia Regional Block: Adductor canal block   Pre-Anesthetic Checklist: ,, timeout performed, Correct Patient, Correct Site, Correct Laterality, Correct Procedure, Correct Position, site marked, Risks and benefits discussed,  Surgical consent,  Pre-op evaluation,  At surgeon's request and post-op pain management  Laterality: Left  Prep: chloraprep       Needles:  Injection technique: Single-shot  Needle Type: Echogenic Needle     Needle Length: 9cm      Additional Needles:   Procedures:,,,, ultrasound used (permanent image in chart),,,,  Narrative:  Start time: 11/22/2020 7:37 AM End time: 11/22/2020 7:42 AM Injection made incrementally with aspirations every 5 mL.  Performed by: Personally  Anesthesiologist: Myrtie Soman, MD  Additional Notes: Patient tolerated the procedure well without complications

## 2020-11-22 NOTE — Interval H&P Note (Signed)
History and Physical Interval Note:  11/22/2020 6:29 AM  Phillip Powell  has presented today for surgery, with the diagnosis of left knee osteoarthritis.  The various methods of treatment have been discussed with the patient and family. After consideration of risks, benefits and other options for treatment, the patient has consented to  Procedure(s): TOTAL KNEE ARTHROPLASTY (Left) as a surgical intervention.  The patient's history has been reviewed, patient examined, no change in status, stable for surgery.  I have reviewed the patient's chart and labs.  Questions were answered to the patient's satisfaction.     [name]

## 2020-11-23 ENCOUNTER — Encounter (HOSPITAL_COMMUNITY): Payer: Self-pay | Admitting: Orthopedic Surgery

## 2020-11-23 DIAGNOSIS — M1712 Unilateral primary osteoarthritis, left knee: Secondary | ICD-10-CM | POA: Diagnosis not present

## 2020-11-23 LAB — CBC
HCT: 39.9 % (ref 39.0–52.0)
Hemoglobin: 13.3 g/dL (ref 13.0–17.0)
MCH: 26.7 pg (ref 26.0–34.0)
MCHC: 33.3 g/dL (ref 30.0–36.0)
MCV: 80 fL (ref 80.0–100.0)
Platelets: 198 10*3/uL (ref 150–400)
RBC: 4.99 MIL/uL (ref 4.22–5.81)
RDW: 13.6 % (ref 11.5–15.5)
WBC: 10.6 10*3/uL — ABNORMAL HIGH (ref 4.0–10.5)
nRBC: 0 % (ref 0.0–0.2)

## 2020-11-23 LAB — BASIC METABOLIC PANEL
Anion gap: 11 (ref 5–15)
BUN: 16 mg/dL (ref 8–23)
CO2: 24 mmol/L (ref 22–32)
Calcium: 8.7 mg/dL — ABNORMAL LOW (ref 8.9–10.3)
Chloride: 99 mmol/L (ref 98–111)
Creatinine, Ser: 0.94 mg/dL (ref 0.61–1.24)
GFR, Estimated: >60 mL/min (ref >60)
Glucose, Bld: 200 mg/dL — ABNORMAL HIGH (ref 70–99)
Potassium: 3.7 mmol/L (ref 3.5–5.1)
Sodium: 134 mmol/L — ABNORMAL LOW (ref 135–145)

## 2020-11-23 LAB — GLUCOSE, CAPILLARY
Glucose-Capillary: 220 mg/dL — ABNORMAL HIGH (ref 70–99)
Glucose-Capillary: 291 mg/dL — ABNORMAL HIGH (ref 70–99)

## 2020-11-23 MED ORDER — TRAMADOL HCL 50 MG PO TABS: 50.0000 mg | ORAL_TABLET | Freq: Four times a day (QID) | ORAL | 0 refills | Status: DC | PRN

## 2020-11-23 MED ORDER — RIVAROXABAN 10 MG PO TABS: 10.0000 mg | ORAL_TABLET | Freq: Every day | ORAL | 0 refills | Status: DC

## 2020-11-23 MED ORDER — HYDROMORPHONE HCL 2 MG PO TABS: 2.0000 mg | ORAL_TABLET | Freq: Four times a day (QID) | ORAL | 0 refills | Status: DC | PRN

## 2020-11-23 MED ORDER — METHOCARBAMOL 500 MG PO TABS: 500.0000 mg | ORAL_TABLET | Freq: Four times a day (QID) | ORAL | 0 refills | Status: DC | PRN

## 2020-11-23 NOTE — Progress Notes (Signed)
Subjective: 1 Day Post-Op Procedure(s) (LRB): TOTAL KNEE ARTHROPLASTY (Left) Patient reports pain as mild.   Patient seen in rounds by Dr. Wynelle Link. Patient is well, and has had no acute complaints or problems. No issues overnight, denies chest pain, SOB, or calf pain. Foley catheter to be removed this AM. States he is ready to go home.  We will continue therapy today.   Objective: Vital signs in last 24 hours: Temp:  [97.7 F (36.5 C)-98.4 F (36.9 C)] 98.3 F (36.8 C) (05/03 0502) Pulse Rate:  [70-85] 85 (05/03 0502) Resp:  [12-17] 16 (05/03 0502) BP: (110-163)/(67-91) 134/79 (05/03 0502) SpO2:  [95 %-100 %] 95 % (05/03 0502)  Intake/Output from previous day:  Intake/Output Summary (Last 24 hours) at 11/23/2020 0806 Last data filed at 11/23/2020 0503 Gross per 24 hour  Intake 4627.5 ml  Output 3770 ml  Net 857.5 ml     Intake/Output this shift: No intake/output data recorded.  Labs: Recent Labs    11/22/20 1020 11/23/20 0307  HGB 13.1 13.3   Recent Labs    11/22/20 1020 11/23/20 0307  WBC 6.7 10.6*  RBC 4.73 4.99  HCT 39.1 39.9  PLT 170 198   Recent Labs    11/22/20 1020 11/23/20 0307  NA 135 134*  K 3.9 3.7  CL 101 99  CO2 25 24  BUN 19 16  CREATININE 0.88 0.94  GLUCOSE 163* 200*  CALCIUM 8.5* 8.7*   Recent Labs    11/22/20 1020  INR 1.1    Exam: General - Patient is Alert and Oriented Extremity - Neurologically intact Neurovascular intact Sensation intact distally Dorsiflexion/Plantar flexion intact Dressing - dressing C/D/I Motor Function - intact, moving foot and toes well on exam.   Past Medical History:  Diagnosis Date  .  Covid-19 Positive (02/06/2019)  02/11/2019  . Arthritis   . BPH (benign prostatic hypertrophy)   . Diabetes mellitus without complication ([place])    type w   . GERD (gastroesophageal reflux disease)   . Hepatic steatosis   . History of kidney stones   . Hydroureteronephrosis   . Hypertension   .  Hypogonadism male   . Mass    left shoulder AC joint  . OSA (obstructive sleep apnea)    wears CPAP wears 4 hours per nite   . PONV (postoperative nausea and vomiting)   . Renal calculi   . Sarcoidosis   . Vitamin D deficiency     Assessment/Plan: 1 Day Post-Op Procedure(s) (LRB): TOTAL KNEE ARTHROPLASTY (Left) Principal Problem:   OA (osteoarthritis) of knee Active Problems:   Primary osteoarthritis of left knee  Estimated body mass index is 31.63 kg/m as calculated from the following:   Height as of this encounter: '5\' 8"'$  (1.727 m).   Weight as of this encounter: 94.3 kg. Advance diet Up with therapy D/C IV fluids   Patient's anticipated LOS is less than 2 midnights, meeting these requirements: - Younger than 78 - Lives within 1 hour of care - Has a competent adult at home to recover with post-op recover - NO history of  - Chronic pain requiring opiods  - Diabetes  - Coronary Artery Disease  - Heart failure  - Heart attack  - Stroke  - DVT/VTE  - Cardiac arrhythmia  - Respiratory Failure/COPD  - Renal failure  - Anemia  - Advanced Liver disease  DVT Prophylaxis - Xarelto Weight bearing as tolerated. Continue therapy.  Plan is to go Home after hospital  stay. Plan for discharge later today if progresses with therapy and meeting his goals. Scheduled for OPPT at [facility] Follow-up in the office in 2 weeks  The PDMP database was reviewed today prior to any opioid medications being prescribed to this patient.  Theresa Duty, PA-C Orthopedic Surgery [phone] 11/23/2020, 8:06 AM

## 2020-11-23 NOTE — Progress Notes (Signed)
Physical Therapy Treatment Patient Details Name: Phillip Powell MRN: [id number] DOB: 20-Aug-1945 Today's Date: 11/23/2020    History of Present Illness L TKA 11/22/20, H/O DM, GERD,HTN,OSA, BPH sarcoidosis    PT Comments    Pt is progressing well with mobility and is ready to DC home from PT standpoint. He ambulated 30' with RW and demonstrates good understanding of HEP.   Follow Up Recommendations  Follow surgeon's recommendation for DC plan and follow-up therapies     Equipment Recommendations  Rolling walker with 5" wheels (states he does not have one now)    Recommendations for Other Services       Precautions / Restrictions Precautions Precautions: Fall;Knee Restrictions Weight Bearing Restrictions: No Other Position/Activity Restrictions: WBAT    Mobility  Bed Mobility Overal bed mobility: Modified Independent Bed Mobility: Supine to Sit     Supine to sit: Modified independent (Device/Increase time);HOB elevated     General bed mobility comments: used rail    Transfers Overall transfer level: Needs assistance Equipment used: Rolling walker (2 wheeled) Transfers: Sit to/from Stand Sit to Stand: Supervision         General transfer comment: VCs hand placement  Ambulation/Gait Ambulation/Gait assistance: Supervision Gait Distance (Feet): 90 Feet Assistive device: Rolling walker (2 wheeled) Gait Pattern/deviations: Step-to pattern Gait velocity: decr   General Gait Details: no buckling of LLE, steady, distance limited by pain, VCs sequencing initially   Stairs             Wheelchair Mobility    Modified Rankin (Stroke Patients Only)       Balance Overall balance assessment: Needs assistance Sitting-balance support: Bilateral upper extremity supported;Feet supported Sitting balance-Leahy Scale: Good     Standing balance support: During functional activity;Bilateral upper extremity supported Standing balance-Leahy Scale: Poor Standing  balance comment: reliant on UEs                            Cognition Arousal/Alertness: Awake/alert Behavior During Therapy: WFL for tasks assessed/performed Overall Cognitive Status: Within Functional Limits for tasks assessed                                        Exercises Total Joint Exercises Ankle Circles/Pumps: AROM;Both;10 reps;Supine Quad Sets: AROM;Left;5 reps;Supine Short Arc Quad: AROM;Left;5 reps;Supine Heel Slides: AAROM;Left;5 reps;Supine Hip ABduction/ADduction: AROM;Left;5 reps;Supine Straight Leg Raises: AROM;Left;5 reps;Supine Long Arc Quad: AROM;Left;5 reps;Seated Knee Flexion: AAROM;Left;10 reps;Seated Goniometric ROM: 5-60* AAROM L knee in sitting    General Comments        Pertinent Vitals/Pain Pain Score: 8  Pain Location: left knee with walking Pain Descriptors / Indicators: Sore Pain Intervention(s): Limited activity within patient's tolerance;Monitored during session;Premedicated before session;Ice applied    Home Living                      Prior Function            PT Goals (current goals can now be found in the care plan section) Acute Rehab PT Goals Patient Stated Goal: To go home, 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 PT Goal Formulation: With patient/family Time For Goal Achievement: 11/29/20 Potential to Achieve Goals: Good Progress towards PT goals: Progressing toward goals    Frequency    7X/week      PT Plan      Co-evaluation  AM-PAC PT "6 Clicks" Mobility   Outcome Measure  Help needed turning from your back to your side while in a flat bed without using bedrails?: A Little Help needed moving from lying on your back to sitting on the side of a flat bed without using bedrails?: A Little Help needed moving to and from a bed to a chair (including a wheelchair)?: A Little Help needed standing up from a chair using your arms (e.g., wheelchair or bedside chair)?: A  Little Help needed to walk in hospital room?: A Little Help needed climbing 3-5 steps with a railing? : A Little 6 Click Score: 18    End of Session Equipment Utilized During Treatment: Gait belt Activity Tolerance: Patient tolerated treatment well Patient left: in chair;with call bell/phone within reach;with chair alarm set Nurse Communication: Mobility status PT Visit Diagnosis: Unsteadiness on feet (R26.81);Difficulty in walking, not elsewhere classified (R26.2);Pain Pain - Right/Left: Left Pain - part of body: Knee     Time: [id number] PT Time Calculation (min) (ACUTE ONLY): 29 min  Charges:  $Gait Training: 8-22 mins $Therapeutic Exercise: 8-22 mins                    Blondell Reveal Kistler PT 11/23/2020  Acute Rehabilitation Services Pager [phone] Office [phone]

## 2020-11-23 NOTE — TOC Transition Note (Signed)
Transition of Care [facility]) - CM/SW Discharge Note  Patient Details  Name: Phillip Powell MRN: [id number] Date of Birth: [birth year]  [facility] of [facility] CM/SW Contact:  Sherie Don, LCSW Phone Number: 11/23/2020, 11:21 AM  Clinical Narrative: Patient expected to discharge today after working with PT. CSW met with patient to confirm discharge plan. Patient will go to OPPT through [facility]. Patient will need a rolling walker, which [employer] will deliver to patient's room. No additional needs at this time. TOC signing off.  Final next level of care: OP Rehab Barriers to Discharge: No Barriers Identified  Patient Goals and CMS Choice Patient states their goals for this hospitalization and ongoing recovery are:: Discharge home with OPPT through [facility] Medicare.gov Compare Post Acute Care list provided to:: Patient Choice offered to / list presented to : Patient  Discharge Plan and Services        DME Arranged: Walker rolling DME Agency: [employer] Representative spoke with at DME Agency: Pre-arranged in orthopedist's office  Readmission Risk Interventions No flowsheet data found.

## 2020-11-23 NOTE — Plan of Care (Signed)

## 2020-11-29 NOTE — Discharge Summary (Signed)
Physician Discharge Summary   Patient ID: Phillip Powell MRN: [id number] DOB/AGE: 04-21-1946 75 y.o.  Admit date: 11/22/2020 Discharge date: 11/23/2020  Primary Diagnosis: Osteoarthritis, left knee   Admission Diagnoses:  Past Medical History:  Diagnosis Date  .  Covid-19 Positive (02/06/2019)  02/11/2019  . Arthritis   . BPH (benign prostatic hypertrophy)   . 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 GERD (gastroesophageal reflux disease)   . Hepatic steatosis   . History of kidney stones   . Hydroureteronephrosis   . Hypertension   . Hypogonadism male   . Mass    left shoulder AC joint  . OSA (obstructive sleep apnea)    wears CPAP wears 4 hours per nite   . PONV (postoperative nausea and vomiting)   . Renal calculi   . Sarcoidosis   . Vitamin D deficiency    Discharge Diagnoses:   Principal Problem:   OA (osteoarthritis) of knee Active Problems:   Primary osteoarthritis of left knee  Estimated body mass index is 31.63 kg/m as calculated from the following:   Height as of this encounter: '5\' 8"'  (1.727 m).   Weight as of this encounter: 94.3 kg.  Procedure:  Procedure(s) (LRB): TOTAL KNEE ARTHROPLASTY (Left)   Consults: None  HPI: Phillip Powell is a 75 y.o. year old male with end stage OA of his left knee with progressively worsening pain and dysfunction. He has constant pain, with activity and at rest and significant functional deficits with difficulties even with ADLs. He has had extensive non-op management including analgesics, injections of cortisone and viscosupplements, and home exercise program, but remains in significant pain with significant dysfunction. Radiographs show bone on bone arthritis all 3 compartments. He presents now for left Total Knee Arthroplasty.    Laboratory Data: Admission on 11/22/2020, Discharged on 11/23/2020  Component Date Value Ref Range Status  . WBC 11/22/2020 6.7  4.0 - 10.5 K/uL Final  . RBC 11/22/2020 4.73   4.22 - 5.81 MIL/uL Final  . Hemoglobin 11/22/2020 13.1  13.0 - 17.0 g/dL Final  . HCT 11/22/2020 39.1  39.0 - 52.0 % Final  . MCV 11/22/2020 82.7  80.0 - 100.0 fL Final  . MCH 11/22/2020 27.7  26.0 - 34.0 pg Final  . MCHC 11/22/2020 33.5  30.0 - 36.0 g/dL Final  . RDW 11/22/2020 13.8  11.5 - 15.5 % Final  . Platelets 11/22/2020 170  150 - 400 K/uL Final  . nRBC 11/22/2020 0.0  0.0 - 0.2 % Final   Performed at [facility], [place] [address], [place], [place] [address]  . Sodium 11/22/2020 135  135 - 145 mmol/L Final  . Potassium 11/22/2020 3.9  3.5 - 5.1 mmol/L Final  . Chloride 11/22/2020 101  98 - 111 mmol/L Final  . CO2 11/22/2020 25  22 - 32 mmol/L Final  . Glucose, Bld 11/22/2020 163* 70 - 99 mg/dL Final   Glucose reference range applies only to samples taken after fasting for at least 8 hours.  . BUN 11/22/2020 19  8 - 23 mg/dL Final  . Creatinine, Ser 11/22/2020 0.88  0.61 - 1.24 mg/dL Final  . Calcium 11/22/2020 8.5* 8.9 - 10.3 mg/dL Final  . Total Protein 11/22/2020 5.8* 6.5 - 8.1 g/dL Final  . Albumin 11/22/2020 3.3* 3.5 - 5.0 g/dL Final  . AST 11/22/2020 14* 15 - 41 U/L Final  . ALT 11/22/2020 14  0 - 44 U/L Final  .  Alkaline Phosphatase 11/22/2020 58  38 - 126 U/L Final  . Total Bilirubin 11/22/2020 0.5  0.3 - 1.2 mg/dL Final  . GFR, Estimated 11/22/2020 >60  >60 mL/min Final   Comment: (NOTE) Calculated using the CKD-EPI Creatinine Equation (2021)   . Anion gap 11/22/2020 9  5 - 15 Final   Performed at [facility], [place] [address] [place], [place] [address]  . Prothrombin Time 11/22/2020 13.7  11.4 - 15.2 seconds Final  . INR 11/22/2020 1.1  0.8 - 1.2 Final   Comment: (NOTE) INR goal varies based on device and disease states. Performed at [facility], [place] [address], [place], [place] [address]   . aPTT 11/22/2020 26  24 - 36 seconds Final   Performed at [facility], [place] [address],  [place], [place] [address]  . ABO/RH(D) 11/22/2020 A POS   Final  . Antibody Screen 11/22/2020 NEG   Final  . Sample Expiration 11/22/2020    Final                   Value:11/25/2020,2359 Performed at [facility], [place] [address] [place], [place] [address]   . Hgb A1c MFr Bld 11/22/2020 7.2* 4.8 - 5.6 % Final   Comment: (NOTE) Pre diabetes:          5.7%-6.4%  Diabetes:              >6.4%  Glycemic control for   <7.0% adults with diabetes   . Mean Plasma Glucose 11/22/2020 159.94  mg/dL Final   Performed at [facility], [place] [address], [place], [place] [address]  . Glucose-Capillary 11/22/2020 168* 70 - 99 mg/dL Final   Glucose reference range applies only to samples taken after fasting for at least 8 hours.  . Comment 1 11/22/2020 Notify RN   Final  . Comment 2 11/22/2020 Document in Chart   Final  . Glucose-Capillary 11/22/2020 147* 70 - 99 mg/dL Final   Glucose reference range applies only to samples taken after fasting for at least 8 hours.  . Glucose-Capillary 11/22/2020 258* 70 - 99 mg/dL Final   Glucose reference range applies only to samples taken after fasting for at least 8 hours.  . WBC 11/23/2020 10.6* 4.0 - 10.5 K/uL Final  . RBC 11/23/2020 4.99  4.22 - 5.81 MIL/uL Final  . Hemoglobin 11/23/2020 13.3  13.0 - 17.0 g/dL Final  . HCT 11/23/2020 39.9  39.0 - 52.0 % Final  . MCV 11/23/2020 80.0  80.0 - 100.0 fL Final  . MCH 11/23/2020 26.7  26.0 - 34.0 pg Final  . MCHC 11/23/2020 33.3  30.0 - 36.0 g/dL Final  . RDW 11/23/2020 13.6  11.5 - 15.5 % Final  . Platelets 11/23/2020 198  150 - 400 K/uL Final  . nRBC 11/23/2020 0.0  0.0 - 0.2 % Final   Performed at [facility], [place] [address] [place], [place] [address]  . Sodium 11/23/2020 134* 135 - 145 mmol/L Final  . Potassium 11/23/2020 3.7  3.5 - 5.1 mmol/L Final  . Chloride 11/23/2020 99  98 - 111 mmol/L Final  . CO2 11/23/2020 24  22 - 32 mmol/L Final  . Glucose, Bld 11/23/2020  200* 70 - 99 mg/dL Final   Glucose reference range applies only to samples taken after fasting for at least 8 hours.  . BUN 11/23/2020 16  8 - 23 mg/dL Final  . Creatinine, Ser  11/23/2020 0.94  0.61 - 1.24 mg/dL Final  . Calcium 11/23/2020 8.7* 8.9 - 10.3 mg/dL Final  . GFR, Estimated 11/23/2020 >60  >60 mL/min Final   Comment: (NOTE) Calculated using the CKD-EPI Creatinine Equation (2021)   . Anion gap 11/23/2020 11  5 - 15 Final   Performed at [facility], [place] [address] [place], [place] [address]  . Glucose-Capillary 11/22/2020 235* 70 - 99 mg/dL Final   Glucose reference range applies only to samples taken after fasting for at least 8 hours.  . Glucose-Capillary 11/22/2020 241* 70 - 99 mg/dL Final   Glucose reference range applies only to samples taken after fasting for at least 8 hours.  . Glucose-Capillary 11/23/2020 220* 70 - 99 mg/dL Final   Glucose reference range applies only to samples taken after fasting for at least 8 hours.  . Glucose-Capillary 11/23/2020 291* 70 - 99 mg/dL Final   Glucose reference range applies only to samples taken after fasting for at least 8 hours.  Hospital Outpatient Visit on 10/28/2020  Component Date Value Ref Range Status  . SARS Coronavirus 2 10/28/2020 POSITIVE* NEGATIVE Final   Comment: (NOTE) SARS-CoV-2 target nucleic acids are DETECTED.  The SARS-CoV-2 RNA is generally detectable in upper and lower respiratory specimens during the acute phase of infection. Positive results are indicative of the presence of SARS-CoV-2 RNA. Clinical correlation with patient history and other diagnostic information is  necessary to determine patient infection status. Positive results do not rule out bacterial infection or co-infection with other viruses.  The expected result is Negative.  Fact Sheet for Patients: SugarRoll.be  Fact Sheet for Healthcare  Providers: https://www.woods-mathews.com/  This test is not yet approved or cleared by the [place] FDA and  has been authorized for detection and/or diagnosis of SARS-CoV-2 by FDA under an Emergency Use Authorization (EUA). This EUA will remain  in effect (meaning this test can be used) for the duration of the COVID-19 declaration under Section 564(b)(1) of the Act, 21 U.                          S.C. section 360bbb-3(b)(1), unless the authorization is terminated or revoked sooner.   Performed at [facility], [place] [address], [place], [place] [address]   Hospital Outpatient Visit on 10/25/2020  Component Date Value Ref Range Status  . MRSA, PCR 10/25/2020 NEGATIVE  NEGATIVE Final  . Staphylococcus aureus 10/25/2020 NEGATIVE  NEGATIVE Final   Comment: (NOTE) The Xpert SA Assay (FDA approved for NASAL specimens in patients 71 years of age and older), is one component of a comprehensive surveillance program. It is not intended to diagnose infection nor to guide or monitor treatment. Performed at [facility], [place] [address], [place], [place] [address]   . WBC 10/25/2020 7.3  4.0 - 10.5 K/uL Final  . RBC 10/25/2020 5.48  4.22 - 5.81 MIL/uL Final  . Hemoglobin 10/25/2020 14.8  13.0 - 17.0 g/dL Final  . HCT 10/25/2020 44.9  39.0 - 52.0 % Final  . MCV 10/25/2020 81.9  80.0 - 100.0 fL Final  . MCH 10/25/2020 27.0  26.0 - 34.0 pg Final  . MCHC 10/25/2020 33.0  30.0 - 36.0 g/dL Final  . RDW 10/25/2020 13.9  11.5 - 15.5 % Final  . Platelets 10/25/2020 183  150 - 400 K/uL Final  . nRBC 10/25/2020 0.0  0.0 - 0.2 % Final   Performed at [facility]  [place] [address], [place], [place] [address]  . Sodium 10/25/2020 138  135 - 145 mmol/L Final  . Potassium 10/25/2020 3.9  3.5 - 5.1 mmol/L Final  . Chloride 10/25/2020 103  98 - 111 mmol/L Final  . CO2 10/25/2020 25  22 - 32 mmol/L Final  . Glucose, Bld 10/25/2020 142* 70 - 99 mg/dL  Final   Glucose reference range applies only to samples taken after fasting for at least 8 hours.  . BUN 10/25/2020 16  8 - 23 mg/dL Final  . Creatinine, Ser 10/25/2020 1.06  0.61 - 1.24 mg/dL Final  . Calcium 10/25/2020 9.2  8.9 - 10.3 mg/dL Final  . Total Protein 10/25/2020 6.9  6.5 - 8.1 g/dL Final  . Albumin 10/25/2020 3.9  3.5 - 5.0 g/dL Final  . AST 10/25/2020 15  15 - 41 U/L Final  . ALT 10/25/2020 15  0 - 44 U/L Final  . Alkaline Phosphatase 10/25/2020 68  38 - 126 U/L Final  . Total Bilirubin 10/25/2020 0.7  0.3 - 1.2 mg/dL Final  . GFR, Estimated 10/25/2020 >60  >60 mL/min Final   Comment: (NOTE) Calculated using the CKD-EPI Creatinine Equation (2021)   . Anion gap 10/25/2020 10  5 - 15 Final   Performed at [facility], [place] [address] [place], [place] [address]  . Prothrombin Time 10/25/2020 13.0  11.4 - 15.2 seconds Final  . INR 10/25/2020 1.0  0.8 - 1.2 Final   Comment: (NOTE) INR goal varies based on device and disease states. Performed at [facility], [place] [address], [place], [place] [address]   . aPTT 10/25/2020 28  24 - 36 seconds Final   Performed at [facility], [place] [address] [place],  [address]  . ABO/RH(D) 10/25/2020 A POS   Final  . Antibody Screen 10/25/2020 NEG   Final  . Sample Expiration 10/25/2020 11/04/2020,2359   Final  . Extend sample reason 10/25/2020    Final                   Value:NO TRANSFUSIONS OR PREGNANCY IN THE PAST 3 MONTHS Performed at [facility], [place] [address] [place],  [address]   . Glucose-Capillary 10/25/2020 149* 70 - 99 mg/dL Final   Glucose reference range applies only to samples taken after fasting for at least 8 hours.  Office Visit on 10/18/2020  Component Date Value Ref Range Status  . WBC 10/18/2020 6.6  3.8 - 10.8 Thousand/uL Final  . RBC 10/18/2020 5.47  4.20 - 5.80 Million/uL Final  . Hemoglobin 10/18/2020 14.7  13.2 - 17.1  g/dL Final  . HCT 10/18/2020 45.5  38.5 - 50.0 % Final  . MCV 10/18/2020 83.2  80.0 - 100.0 fL Final  . MCH 10/18/2020 26.9* 27.0 - 33.0 pg Final  . MCHC 10/18/2020 32.3  32.0 - 36.0 g/dL Final  . RDW 10/18/2020 14.0  11.0 - 15.0 % Final  . Platelets 10/18/2020 193  140 - 400 Thousand/uL Final  . MPV 10/18/2020 9.9  7.5 - 12.5 fL Final  . Neutro Abs 10/18/2020 3,815  1,500 - 7,800 cells/uL Final  . Lymphs Abs 10/18/2020 1,927  850 - 3,900 cells/uL Final  . Absolute Monocytes 10/18/2020 574  200 - 950 cells/uL Final  . Eosinophils Absolute 10/18/2020 191  15 - 500 cells/uL Final  . Basophils Absolute 10/18/2020 92  0 - 200 cells/uL Final  . Neutrophils Relative %  10/18/2020 57.8  % Final  . Total Lymphocyte 10/18/2020 29.2  % Final  . Monocytes Relative 10/18/2020 8.7  % Final  . Eosinophils Relative 10/18/2020 2.9  % Final  . Basophils Relative 10/18/2020 1.4  % Final  . Glucose, Bld 10/18/2020 158* 65 - 99 mg/dL Final   Comment: .            Fasting reference interval . For someone without known diabetes, a glucose value >125 mg/dL indicates that they may have diabetes and this should be confirmed with a follow-up test. .   . BUN 10/18/2020 17  7 - 25 mg/dL Final  . Creat 10/18/2020 0.99  0.70 - 1.18 mg/dL Final   Comment: For patients >4 years of age, the reference limit for Creatinine is approximately 13% higher for people identified as African-American. .   . GFR, Est Non African American 10/18/2020 75  > OR = 60 mL/min/1.41m Final  . GFR, Est African American 10/18/2020 87  > OR = 60 mL/min/1.779mFinal  . BUN/Creatinine Ratio 0355/97/4163OT APPLICABLE  6 - 22 (calc) Final  . Sodium 10/18/2020 140  135 - 146 mmol/L Final  . Potassium 10/18/2020 4.6  3.5 - 5.3 mmol/L Final  . Chloride 10/18/2020 100  98 - 110 mmol/L Final  . CO2 10/18/2020 30  20 - 32 mmol/L Final  . Calcium 10/18/2020 9.3  8.6 - 10.3 mg/dL Final  . Total Protein 10/18/2020 6.3  6.1 - 8.1 g/dL Final   . Albumin 10/18/2020 3.8  3.6 - 5.1 g/dL Final  . Globulin 10/18/2020 2.5  1.9 - 3.7 g/dL (calc) Final  . AG Ratio 10/18/2020 1.5  1.0 - 2.5 (calc) Final  . Total Bilirubin 10/18/2020 0.6  0.2 - 1.2 mg/dL Final  . Alkaline phosphatase (APISO) 10/18/2020 70  35 - 144 U/L Final  . AST 10/18/2020 10  10 - 35 U/L Final  . ALT 10/18/2020 10  9 - 46 U/L Final  . Cholesterol 10/18/2020 157  <200 mg/dL Final  . HDL 10/18/2020 42  > OR = 40 mg/dL Final  . Triglycerides 10/18/2020 217* <150 mg/dL Final   Comment: . If a non-fasting specimen was collected, consider repeat triglyceride testing on a fasting specimen if clinically indicated.  JaYates Decampt al. J. of Clin. Lipidol. 208453;6:468-032.   . LDL Cholesterol (Calc) 10/18/2020 84  mg/dL (calc) Final   Comment: Reference range: <100 . Desirable range <100 mg/dL for primary prevention;   <70 mg/dL for patients with CHD or diabetic patients  with > or = 2 CHD risk factors. . Mar[place] KitchenDL-C is now calculated using the Martin-Hopkins  calculation, which is a validated novel method providing  better accuracy than the Friedewald equation in the  estimation of LDL-C.  MaCresenciano Genret al. JAAnnamaria Helling201224;825(00 [id number]  (http://education.Quest[employer].com/faq/FAQ164)   . Total CHOL/HDL Ratio 10/18/2020 3.7  <5.0 (calc) Final  . Non-HDL Cholesterol (Calc) 10/18/2020 115  <130 mg/dL (calc) Final   Comment: For patients with diabetes plus 1 major ASCVD risk  factor, treating to a non-HDL-C goal of <100 mg/dL  (LDL-C of <70 mg/dL) is considered a therapeutic  option.   . Hgb A1c MFr Bld 10/18/2020 7.2* <5.7 % of total Hgb Final   Comment: For someone without known diabetes, a hemoglobin A1c value of 6.5% or greater indicates that they may have  diabetes and this should be confirmed with a follow-up  test. . For someone with known diabetes, a value <7% indicates  that their diabetes is well controlled and a value  greater than or equal to 7%  indicates suboptimal  control. A1c targets should be individualized based on  duration of diabetes, age, comorbid conditions, and  other considerations. . Currently, no consensus exists regarding use of hemoglobin A1c for diagnosis of diabetes for children. .   . Mean Plasma Glucose 10/18/2020 160  mg/dL Final  . eAG (mmol/L) 10/18/2020 8.9  mmol/L Final     X-Rays:No results found.  EKG: Orders placed or performed in visit on 07/08/20  . EKG 12-Lead     Hospital Course: Phillip Powell is a 75 y.o. who was admitted to [facility]. They were brought to the operating room on 11/22/2020 and underwent Procedure(s): TOTAL KNEE ARTHROPLASTY.  Patient tolerated the procedure well and was later transferred to the recovery room and then to the orthopaedic floor for postoperative care. They were given PO and IV analgesics for pain control following their surgery. They were given 24 hours of postoperative antibiotics of  Anti-infectives (From admission, onward)   Start     Dose/Rate Route Frequency Ordered Stop   11/22/20 1400  ceFAZolin (ANCEF) IVPB 2g/100 mL premix        2 g 200 mL/hr over 30 Minutes Intravenous Every 6 hours 11/22/20 1226 11/22/20 2239   11/22/20 0600  ceFAZolin (ANCEF) IVPB 2g/100 mL premix        2 g 200 mL/hr over 30 Minutes Intravenous On call to O.R. 11/22/20 0543 11/22/20 0810     and started on DVT prophylaxis in the form of Xarelto.   PT and OT were ordered for total joint protocol. Discharge planning consulted to help with postop disposition and equipment needs.  Patient had a good night on the evening of surgery. They started to get up OOB with therapy on POD #0. Pt was seen during rounds and was ready to go home pending progress with therapy. He worked with therapy on POD #1 and was meeting his goals. Pt was discharged to home later that day in stable condition.  Diet: Diabetic diet Activity: WBAT Follow-up: in 2 weeks Disposition: Home with  OPPT Discharged Condition: stable   Discharge Instructions    Call MD / Call 911   Complete by: As directed    If you experience chest pain or shortness of breath, CALL 911 and be transported to the hospital emergency room.  If you develope a fever above 101 F, pus (white drainage) or increased drainage or redness at the wound, or calf pain, call your surgeon's office.   Change dressing   Complete by: As directed    You may remove the bulky bandage (ACE wrap and gauze) two days after surgery. You will have an adhesive waterproof bandage underneath. Leave this in place until your first follow-up appointment.   Constipation Prevention   Complete by: As directed    Drink plenty of fluids.  Prune juice may be helpful.  You may use a stool softener, such as Colace (over the counter) 100 mg twice a day.  Use MiraLax (over the counter) for constipation as needed.   Diet - low sodium heart healthy   Complete by: As directed    Do not put a pillow under the knee. Place it under the heel.   Complete by: As directed    Driving restrictions   Complete by: As directed    No driving for two weeks   Post-operative opioid taper instructions:  Complete by: As directed    POST-OPERATIVE OPIOID TAPER INSTRUCTIONS: It is important to wean off of your opioid medication as soon as possible. If you do not need pain medication after your surgery it is ok to stop day one. Opioids include: Codeine, Hydrocodone(Norco, Vicodin), Oxycodone(Percocet, oxycontin) and hydromorphone amongst others.  Long term and even short term use of opiods can cause: Increased pain response Dependence Constipation Depression Respiratory depression And more.  Withdrawal symptoms can include Flu like symptoms Nausea, vomiting And more Techniques to manage these symptoms Hydrate well Eat regular healthy meals Stay active Use relaxation techniques(deep breathing, meditating, yoga) Do Not substitute Alcohol to help with  tapering If you have been on opioids for less than two weeks and do not have pain than it is ok to stop all together.  Plan to wean off of opioids This plan should start within one week post op of your joint replacement. Maintain the same interval or time between taking each dose and first decrease the dose.  Cut the total daily intake of opioids by one tablet each day Next start to increase the time between doses. The last dose that should be eliminated is the evening dose.      TED hose   Complete by: As directed    Use stockings (TED hose) for three weeks on both leg(s).  You may remove them at night for sleeping.   Weight bearing as tolerated   Complete by: As directed      Allergies as of 11/23/2020      Reactions   Ace Inhibitors Other (See Comments)   Unknown   Ibuprofen Other (See Comments)   GI upset   Invokana [canagliflozin] Other (See Comments)   Unknown   Quinidine Other (See Comments)   Unknown      Medication List    STOP taking these medications   aspirin EC 81 MG tablet   Cinnamon 500 MG Tabs   magnesium gluconate 500 MG tablet Commonly known as: MAGONATE   vitamin B-12 1000 MCG tablet Commonly known as: CYANOCOBALAMIN   Vitamin D3 50 MCG (2000 UT) Tabs   zinc gluconate 50 MG tablet     TAKE these medications   acetaminophen 500 MG tablet Commonly known as: TYLENOL Take 1,000 mg by mouth every 6 (six) hours as needed for moderate pain.   amLODipine 10 MG tablet Commonly known as: NORVASC Take  1/2 to 1 tablet  Daily  for BP What changed: additional instructions   atenolol 100 MG tablet Commonly known as: TENORMIN Take  1 tablet  Daily  for BP   B-D 3CC LUER-LOK SYR 21GX1" 21G X 1" 3 ML Misc Generic drug: SYRINGE-NEEDLE (DISP) 3 ML use as directed   Blood Glucose Monitoring Suppl Devi Use meter three times a day to check blood sugar   OneTouch Verio w/Device Kit Test blood sugar once daily.   famotidine 40 MG tablet Commonly known  as: PEPCID Take     1 tablet      at Bedtime       for Indigestion & Acid Reflux What changed:   how much to take  how to take this  when to take this  additional instructions   gabapentin 100 MG capsule Commonly known as: Neurontin Take 1 to 2 capsules 2 to 3 x  /day as needed for Chronic Pain   glucose blood test strip Test blood sugar three times a day   OneTouch Verio test strip Generic  drug: glucose blood Test sugars 3 x a day for hypoglycemia/hyperglycemia and fluctuating sugars   glyBURIDE 5 MG tablet Commonly known as: DIABETA Take 1/2 to 1 tablet     3 x /day       with Meals for Diabetes What changed:   how much to take  how to take this  when to take this  reasons to take this  additional instructions   HYDROmorphone 2 MG tablet Commonly known as: DILAUDID Take 1-2 tablets (2-4 mg total) by mouth every 6 (six) hours as needed for severe pain.   leflunomide 10 MG tablet Commonly known as: ARAVA Take 10 mg by mouth daily.   meclizine 25 MG tablet Commonly known as: ANTIVERT Take 25 mg by mouth 3 (three) times daily as needed for dizziness.   metFORMIN 500 MG 24 hr tablet Commonly known as: Glucophage XR Take     1 to 2 tablets       2 x /day        with Meals      for Diabetes What changed:   how much to take  how to take this  when to take this  additional instructions   methocarbamol 500 MG tablet Commonly known as: ROBAXIN Take 1 tablet (500 mg total) by mouth every 6 (six) hours as needed for muscle spasms.   olmesartan 40 MG tablet Commonly known as: BENICAR Take 1 tablet (40 mg total) by mouth daily.   ondansetron 8 MG tablet Commonly known as: Zofran Take 1/2 to 1 tablet 3 x day if needed for Nausea   onetouch ultrasoft lancets Test blood sugar once daily   oxybutynin 5 MG tablet Commonly known as: DITROPAN Take 1 tablet (5 mg total) by mouth every 8 (eight) hours as needed for bladder spasms.   rivaroxaban 10 MG Tabs  tablet Commonly known as: XARELTO Take 1 tablet (10 mg total) by mouth daily with breakfast for 20 days. Then resume one 81 mg aspirin once a day.   tamsulosin 0.4 MG Caps capsule Commonly known as: FLOMAX Take      1 tablet       at Bedtime        for Prostate What changed:   how much to take  how to take this  when to take this  additional instructions   traMADol 50 MG tablet Commonly known as: ULTRAM Take 1-2 tablets (50-100 mg total) by mouth every 6 (six) hours as needed for moderate pain.   Trulicity 1.5 GL/8.7FI Sopn Generic drug: Dulaglutide Inject 1.5 mg into the skin once a week.            Discharge Care Instructions  (From admission, onward)         Start     Ordered   11/23/20 0000  Weight bearing as tolerated        11/23/20 0810   11/23/20 0000  Change dressing       Comments: You may remove the bulky bandage (ACE wrap and gauze) two days after surgery. You will have an adhesive waterproof bandage underneath. Leave this in place until your first follow-up appointment.   11/23/20 0810          Follow-up Information    Gaynelle Arabian, MD. Schedule an appointment as soon as possible for a visit on 12/07/2020.   Specialty: Orthopedic Surgery Contact information: [address] [place] [address] [phone]  Signed: Theresa Duty, PA-C Orthopedic Surgery 11/29/2020, 7:33 AM

## 2020-12-09 ENCOUNTER — Encounter: Payer: Managed Care, Other (non HMO) | Admitting: Internal Medicine

## 2020-12-30 ENCOUNTER — Other Ambulatory Visit: Payer: Self-pay

## 2020-12-30 DIAGNOSIS — E1122 Type 2 diabetes mellitus with diabetic chronic kidney disease: Secondary | ICD-10-CM

## 2020-12-30 MED ORDER — ONETOUCH VERIO VI STRP: ORAL_STRIP | 12 refills | Status: DC

## 2021-02-03 ENCOUNTER — Other Ambulatory Visit: Payer: Self-pay | Admitting: Internal Medicine

## 2021-02-03 DIAGNOSIS — I10 Essential (primary) hypertension: Secondary | ICD-10-CM

## 2021-02-06 NOTE — Progress Notes (Signed)
Future Appointments  Date Time Provider [place]  02/07/2021  9:30 AM Unk Pinto, MD [facility] None  07/26/2021 11:00 AM Unk Pinto, MD [facility] None    History of Present Illness:       This very nice 75 y.o. MWM presents for 6 month follow up with HTN, HLD, T2_NIDDM and Vitamin D Deficiency.  In May 2022, patient underwent a total Lt. TKA.  Patient has hx/o Gout (off meds) . He also has hx/o OSA on CPAP with improved restorative sleep.  On 09/19/2019, Abd CT scan showed Aortic atherosclerosis. Patient has GERD controlled on hid meds.       Patient is treated for HTN  (1985) & BP has been controlled at home. Today's BP is at goal - 128/74. Patient has had no complaints of any cardiac type chest pain, palpitations, dyspnea / orthopnea / PND, dizziness, claudication, or dependent edema.       Hyperlipidemia is controlled with diet & meds. Patient denies myalgias or other med SE's. 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  Last A1c was not at goal:  Lab Results  Component Value Date   HGBA1C 7.2 (H) 11/22/2020                                                       Further, the patient also has history of Vitamin D Deficiency ("37" /2008) and supplements vitamin D without any suspected side-effects. Last vitamin D was low :  Lab Results  Component Value Date   VD25OH 48 07/08/2020     Current Outpatient Medications on File Prior to Visit  Medication Sig   acetaminophen (TYLENOL) 500 MG tablet Take 1,000 mg by mouth every 6 (six) hours as needed for moderate pain.   amLODipine (NORVASC) 10 MG tablet Take  1/2 to 1 tablet   Daily  for BP (Patient taking differently: No sig reported)   atenolol (TENORMIN) 100 MG tablet TAKE 1 TABLET BY MOUTH DAILY FOR BLOOD PRESSURE   Dulaglutide (TRULICITY) 1.5 0000000 SOPN Inject 1.5 mg into the skin once a week.   famotidine (PEPCID) 40 MG tablet Take     1 tablet      at Bedtime       for Indigestion & Acid Reflux (Patient taking differently: Take 40 mg by mouth daily.)   gabapentin (NEURONTIN) 100 MG capsule Take 1 to 2 capsules 2 to 3 x  /day as needed for Chronic Pain (Patient taking differently: Take 1 to 2 capsules 2 to 3 x  /day as needed for Chronic Pain)   glyBURIDE (DIABETA) 5 MG tablet Take 1/2 to 1 tablet     3 x /day       with Meals for Diabetes (Patient taking differently: Take 5 mg by mouth daily as needed (blood sugar above 200).)   leflunomide (ARAVA) 10 MG tablet Take 10 mg by mouth daily.   meclizine (ANTIVERT) 25  MG tablet Take 25 mg by mouth 3 (three) times daily as needed for dizziness.   metFORMIN XR 500 MG 24 hr tablet Take     1 to 2 tablets       2 x /day        with Meals      for Diabetes (Patient taking differently: Take 500 mg by mouth daily with breakfast.)   methocarbamol (ROBAXIN) 500 MG tablet Take 1 tablet (500 mg total) by mouth every 6 (six) hours as needed for muscle spasms.   olmesartan (BENICAR) 40 MG tablet Take 1 tablet (40 mg total) by mouth daily.   ondansetron (ZOFRAN) 8 MG tablet Take 1/2 to 1 tablet 3 x day if needed for Nausea   oxybutynin (DITROPAN) 5 MG tablet Take 1 tablet (5 mg total) by mouth every 8 (eight) hours as needed for bladder spasms.   tamsulosin (FLOMAX) 0.4 MG CAPS capsule Take      1 tablet       at Bedtime        for Prostate (Patient taking differently: Take 0.4 mg by mouth at bedtime.)   traMADol (ULTRAM) 50 MG tablet Take 1-2 tablets (50-100 mg total) by mouth every 6 (six) hours as needed for moderate pain.    Allergies  Allergen Reactions   Ace Inhibitors Other (See Comments)    Unknown   Ibuprofen Other  (See Comments)    GI upset   Invokana [Canagliflozin] Other (See Comments)    Unknown   Quinidine Other (See Comments)    Unknown    PMHx:   Past Medical History:  Diagnosis Date    Covid-19 Positive (02/06/2019)  02/11/2019   Arthritis    BPH (benign prostatic hypertrophy)    Diabetes mellitus without complication ([place])    type w    GERD (gastroesophageal reflux disease)    Hepatic steatosis    History of kidney stones    Hydroureteronephrosis    Hypertension    Hypogonadism male    Mass    left shoulder AC joint   OSA (obstructive sleep apnea)    wears CPAP wears 4 hours per nite    PONV (postoperative nausea and vomiting)    Renal calculi    Sarcoidosis    Vitamin D deficiency      Immunization History  Administered Date(s) Administered   Fluad Quad(high Dose 65+) 04/28/2019   Influenza Split 06/23/2013   Influenza, High Dose Seasonal PF 06/29/2014, 05/24/2015, 04/24/2016, 06/11/2017, 07/08/2020   Influenza,inj,quad, With Preservative 04/23/2017   Influenza-Unspecified 04/28/2019   PFIZER(Purple Top)SARS-COV-2 Vaccination 08/23/2019, 09/09/2019, 09/13/2019   Pneumococcal Conjugate-13 01/05/2014   Pneumococcal-Unspecified 07/24/1994   Td 06/23/2002   Zoster, Live 10/23/2010     Past Surgical History:  Procedure Laterality Date   APPENDECTOMY     COLONOSCOPY     CYSTOSCOPY/URETEROSCOPY/HOLMIUM LASER/STENT PLACEMENT Right 10/01/2019   Procedure: CYSTOSCOPY/RETROGRADE/URETEROSCOPY/HOLMIUM LASER/STENT PLACEMENT;  Surgeon: Ceasar Mons, MD;  Location: [facility];  Service: Urology;  Laterality: Right;  ONLY NEEDS 45 MIN   HAND SURGERY Right 2019   Dr. Amedeo Plenty, tendon repair of R thumb   HERNIA REPAIR  [id number]   umbilical   KNEE ARTHROSCOPY     ORIF CLAVICULAR FRACTURE Left 03/29/2018   Procedure: LEFT SHOULDER OPEN DISTAL CLAVICLE RESECTION, CYST EXCISION;  Surgeon: Netta Cedars, MD;  Location: [facility];  Service: Orthopedics;  Laterality: Left;   ROTATOR CUFF  REPAIR Left 1996   ROTATOR CUFF REPAIR Right 1997  TONSILLECTOMY  1952   TOTAL KNEE ARTHROPLASTY Left 11/22/2020   Procedure: TOTAL KNEE ARTHROPLASTY;  Surgeon: Gaynelle Arabian, MD;  Location: [facility];  Service: Orthopedics;  Laterality: Left;    FHx:    Reviewed / unchanged  SHx:    Reviewed / unchanged   Systems Review:  Constitutional: Denies fever, chills, wt changes, headaches, insomnia, fatigue, night sweats, change in appetite. Eyes: Denies redness, blurred vision, diplopia, discharge, itchy, watery eyes.  ENT: Denies discharge, congestion, post nasal drip, epistaxis, sore throat, earache, hearing loss, dental pain, tinnitus, vertigo, sinus pain, snoring.  CV: Denies chest pain, palpitations, irregular heartbeat, syncope, dyspnea, diaphoresis, orthopnea, PND, claudication or edema. Respiratory: denies cough, dyspnea, DOE, pleurisy, hoarseness, laryngitis, wheezing.  Gastrointestinal: Denies dysphagia, odynophagia, heartburn, reflux, water brash, abdominal pain or cramps, nausea, vomiting, bloating, diarrhea, constipation, hematemesis, melena, hematochezia  or hemorrhoids. Genitourinary: Denies dysuria, frequency, urgency, nocturia, hesitancy, discharge, hematuria or flank pain. Musculoskeletal: Denies arthralgias, myalgias, stiffness, jt. swelling, pain, limping or strain/sprain.  Skin: Denies pruritus, rash, hives, warts, acne, eczema or change in skin lesion(s). Neuro: No weakness, tremor, incoordination, spasms, paresthesia or pain. Psychiatric: Denies confusion, memory loss or sensory loss. Endo: Denies change in weight, skin or hair change.  Heme/Lymph: No excessive bleeding, bruising or enlarged lymph nodes.  Physical Exam  BP 128/74   Pulse 82   Temp (!) 97 F (36.1 C)   Resp 16   Ht 5' 7.5" (1.715 m)   Wt 211 lb 6.4 oz (95.9 kg)   SpO2 96%   BMI 32.62 kg/m   Appears  well nourished, well groomed  and in no distress.  Eyes: PERRLA, EOMs, conjunctiva no swelling or  erythema. Sinuses: No frontal/maxillary tenderness ENT/Mouth: EAC's clear, TM's nl w/o erythema, bulging. Nares clear w/o erythema, swelling, exudates. Oropharynx clear without erythema or exudates. Oral hygiene is good. Tongue normal, non obstructing. Hearing intact.  Neck: Supple. Thyroid not palpable. Car 2+/2+ without bruits, nodes or JVD. Chest: Respirations nl with BS clear & equal w/o rales, rhonchi, wheezing or stridor.  Cor: Heart sounds normal w/ regular rate and rhythm without sig. murmurs, gallops, clicks or rubs. Peripheral pulses normal and equal  without edema.  Abdomen: Soft & bowel sounds normal. Non-tender w/o guarding, rebound, hernias, masses or organomegaly.  Lymphatics: Unremarkable.  Musculoskeletal: Full ROM all peripheral extremities, joint stability, 5/5 strength and normal gait.  Skin: Warm, dry without exposed rashes, lesions or ecchymosis apparent.  Neuro: Cranial nerves intact, reflexes equal bilaterally. Sensory-motor testing grossly intact. Tendon reflexes grossly intact.  Pysch: Alert & oriented x 3.  Insight and judgement nl & appropriate. No ideations.  Assessment and Plan:  1. Essential hypertension  - Continue medication, monitor blood pressure at home.  - Continue DASH diet.  Reminder to go to the ER if any CP,  SOB, nausea, dizziness, severe HA, changes vision/speech.   - CBC with Differential/Platelet - COMPLETE METABOLIC PANEL WITH GFR - Magnesium - TSH  2. Hyperlipidemia associated with type 2 diabetes mellitus ([place])  - Continue diet/meds, exercise,& lifestyle modifications.  - Continue monitor periodic cholesterol/liver & renal functions    - Lipid panel - TSH  3. Type 2 diabetes mellitus with stage 2 chronic kidney  disease, without long-term current use of insulin ([facility])  - Continue diet, exercise  - Lifestyle modifications.  - Monitor appropriate labs. - Hemoglobin A1c - Insulin, random  4. Vitamin D deficiency  - Continue  supplementation.  - VITAMIN D 25 Hydroxy  5. Aortic atherosclerosis ([name]) by Abd CT scan ion 09/19/2019  - Lipid panel  6. Gastroesophageal reflux disease  - CBC with Differential/Platelet  7. Medication management  - CBC with Differential/Platelet - COMPLETE METABOLIC PANEL WITH GFR - Magnesium - Lipid panel - TSH - Hemoglobin A1 - Insulin, random - VITAMIN D 25 Hydroxy        Discussed  regular exercise, BP monitoring, weight control to achieve/maintain BMI less than 25 and discussed med and SE's. Recommended labs to assess and monitor clinical status with further disposition pending results of labs.  I discussed the assessment and treatment plan with the patient. The patient was provided an opportunity to ask questions and all were answered. The patient agreed with the plan and demonstrated an understanding of the instructions.  I provided over 30 minutes of exam, counseling, chart review and  complex critical decision making.       The patient was advised to call back or seek an in-person evaluation if the symptoms worsen or if the condition fails to improve as anticipated.   Kirtland Bouchard, MD

## 2021-02-07 ENCOUNTER — Encounter: Payer: Self-pay | Admitting: Internal Medicine

## 2021-02-07 ENCOUNTER — Ambulatory Visit (INDEPENDENT_AMBULATORY_CARE_PROVIDER_SITE_OTHER): Payer: Managed Care, Other (non HMO) | Admitting: Internal Medicine

## 2021-02-07 ENCOUNTER — Other Ambulatory Visit: Payer: Self-pay

## 2021-02-07 VITALS — BP 128/74 | HR 82 | Temp 97.0°F | Resp 16 | Ht 67.5 in | Wt 211.4 lb

## 2021-02-07 DIAGNOSIS — E1122 Type 2 diabetes mellitus with diabetic chronic kidney disease: Secondary | ICD-10-CM

## 2021-02-07 DIAGNOSIS — E559 Vitamin D deficiency, unspecified: Secondary | ICD-10-CM

## 2021-02-07 DIAGNOSIS — E1169 Type 2 diabetes mellitus with other specified complication: Secondary | ICD-10-CM | POA: Diagnosis not present

## 2021-02-07 DIAGNOSIS — E785 Hyperlipidemia, unspecified: Secondary | ICD-10-CM

## 2021-02-07 DIAGNOSIS — Z79899 Other long term (current) drug therapy: Secondary | ICD-10-CM

## 2021-02-07 DIAGNOSIS — I10 Essential (primary) hypertension: Secondary | ICD-10-CM

## 2021-02-07 DIAGNOSIS — K219 Gastro-esophageal reflux disease without esophagitis: Secondary | ICD-10-CM

## 2021-02-07 DIAGNOSIS — N182 Chronic kidney disease, stage 2 (mild): Secondary | ICD-10-CM

## 2021-02-07 DIAGNOSIS — I700 Atherosclerosis of aorta: Secondary | ICD-10-CM

## 2021-02-07 NOTE — Patient Instructions (Signed)

## 2021-02-08 LAB — LIPID PANEL
Cholesterol: 181 mg/dL (ref <200)
HDL: 42 mg/dL (ref >=40)
LDL Cholesterol (Calc): 96 mg/dL (calc)
Non-HDL Cholesterol (Calc): 139 mg/dL (calc) — ABNORMAL HIGH (ref <130)
Total CHOL/HDL Ratio: 4.3 (calc) (ref <5.0)
Triglycerides: 325 mg/dL — ABNORMAL HIGH (ref <150)

## 2021-02-08 LAB — CBC WITH DIFFERENTIAL/PLATELET
Absolute Monocytes: 528 cells/uL (ref 200–950)
Basophils Absolute: 72 cells/uL (ref 0–200)
Basophils Relative: 0.9 %
Eosinophils Absolute: 208 cells/uL (ref 15–500)
Eosinophils Relative: 2.6 %
HCT: 43.6 % (ref 38.5–50.0)
Hemoglobin: 13.8 g/dL (ref 13.2–17.1)
Lymphs Abs: 2280 cells/uL (ref 850–3900)
MCH: 25.8 pg — ABNORMAL LOW (ref 27.0–33.0)
MCHC: 31.7 g/dL — ABNORMAL LOW (ref 32.0–36.0)
MCV: 81.5 fL (ref 80.0–100.0)
MPV: 9.3 fL (ref 7.5–12.5)
Monocytes Relative: 6.6 %
Neutro Abs: 4912 cells/uL (ref 1500–7800)
Neutrophils Relative %: 61.4 %
Platelets: 224 10*3/uL (ref 140–400)
RBC: 5.35 10*6/uL (ref 4.20–5.80)
RDW: 14.8 % (ref 11.0–15.0)
Total Lymphocyte: 28.5 %
WBC: 8 10*3/uL (ref 3.8–10.8)

## 2021-02-08 LAB — COMPLETE METABOLIC PANEL WITH GFR
AG Ratio: 1.7 (calc) (ref 1.0–2.5)
ALT: 13 U/L (ref 9–46)
AST: 12 U/L (ref 10–35)
Albumin: 4.3 g/dL (ref 3.6–5.1)
Alkaline phosphatase (APISO): 83 U/L (ref 35–144)
BUN: 20 mg/dL (ref 7–25)
CO2: 29 mmol/L (ref 20–32)
Calcium: 9.5 mg/dL (ref 8.6–10.3)
Chloride: 100 mmol/L (ref 98–110)
Creat: 0.82 mg/dL (ref 0.70–1.28)
Globulin: 2.5 g/dL (calc) (ref 1.9–3.7)
Glucose, Bld: 129 mg/dL — ABNORMAL HIGH (ref 65–99)
Potassium: 4.5 mmol/L (ref 3.5–5.3)
Sodium: 138 mmol/L (ref 135–146)
Total Bilirubin: 0.5 mg/dL (ref 0.2–1.2)
Total Protein: 6.8 g/dL (ref 6.1–8.1)
eGFR: 93 mL/min/{1.73_m2} (ref >=60)

## 2021-02-08 LAB — HEMOGLOBIN A1C
Hgb A1c MFr Bld: 7.3 % of total Hgb — ABNORMAL HIGH (ref <5.7)
Mean Plasma Glucose: 163 mg/dL
eAG (mmol/L): 9 mmol/L

## 2021-02-08 LAB — INSULIN, RANDOM: Insulin: 19.2 u[IU]/mL

## 2021-02-08 LAB — MAGNESIUM: Magnesium: 1.8 mg/dL (ref 1.5–2.5)

## 2021-02-08 LAB — TSH: TSH: 1.92 mIU/L (ref 0.40–4.50)

## 2021-02-08 LAB — VITAMIN D 25 HYDROXY (VIT D DEFICIENCY, FRACTURES): Vit D, 25-Hydroxy: 40 ng/mL (ref 30–100)

## 2021-02-08 NOTE — Progress Notes (Signed)
============================================================ ============================================================  -    A1c = 7.3% - still way too high!                      (  Ideal or Goal is less than 130 mg%  )   ============================================================ ============================================================  -  Total Chol = 181    and    Bad LDL Chol = 96 - Both very good. ============================================================ ============================================================  -  Triglycerides ( 35    ) or fats in blood are too high  (goal is less than 150)    - Recommend avoid fried & greasy foods,  sweets / candy,   - Avoid white rice  (brown or wild rice or Quinoa is OK),   - Avoid white potatoes  (sweet potatoes are OK)   - Avoid anything made from white flour  - bagels, doughnuts, rolls, buns, biscuits, white and   wheat breads, pizza crust and traditional  pasta made of white flour & egg white  - (vegetarian pasta or spinach or wheat pasta is OK).    - Multi-grain bread is OK - like multi-grain flat bread or  sandwich thins.   - Avoid alcohol in excess.   - Exercise is also important. ============================================================ ============================================================  -  Vitamin D = 40 - is very Low  - Vitamin D goal is between 70-100.   - Please take  Vitamin D 10,000 units Every Day !  - It is very important as a natural anti-inflammatory and helping the  immune system protect against viral infections, like the Covid-19    helping hair, skin, and nails, as well as reducing stroke and  heart attack risk.   - It helps your bones and helps with mood.  - It also decreases numerous cancer risks so please  take it as directed.   - Low Vit D is associated with a 200-300% higher risk for  CANCER   and 200-300% higher risk for HEART   ATTACK  &  STROKE.    - It is  also associated with higher death rate at younger ages,   autoimmune diseases like Rheumatoid arthritis, Lupus,  Multiple Sclerosis.     - Also many other serious conditions, like depression, Alzheimer's  Dementia, infertility, muscle aches, fatigue, fibromyalgia   - just to name a few. ============================================================ ============================================================  -  All Else - CBC - Kidneys - Electrolytes - Liver - Magnesium & Thyroid    - all  Normal / OK ===========================================================

## 2021-03-30 ENCOUNTER — Other Ambulatory Visit: Payer: Self-pay | Admitting: Internal Medicine

## 2021-03-30 DIAGNOSIS — N401 Enlarged prostate with lower urinary tract symptoms: Secondary | ICD-10-CM

## 2021-03-30 DIAGNOSIS — N138 Other obstructive and reflux uropathy: Secondary | ICD-10-CM

## 2021-05-05 ENCOUNTER — Other Ambulatory Visit: Payer: Self-pay | Admitting: Adult Health Nurse Practitioner

## 2021-05-05 DIAGNOSIS — I10 Essential (primary) hypertension: Secondary | ICD-10-CM

## 2021-05-09 LAB — HM DIABETES EYE EXAM

## 2021-05-11 NOTE — Progress Notes (Signed)
FOLLOW UP 3 MONTH   Assessment and Plan:  Essential hypertension Change the way you take your medications: Rx Olmesartan 69m every morning and Atenolol 1058mevery night/bedtime.  Monitor blood pressure DECREASE SODIUM intake Monitor blood pressure at home; call if consistently over 130/80 Continue DASH diet.   Reminder to go to the ER if any CP, SOB, nausea, dizziness, severe HA, changes vision/speech, left arm numbness and tingling and jaw pain.   Type 2 diabetes mellitus with stage 2 chronic kidney disease, without long-term current use of insulin (HCC) Increase Trulicity to 45m11mQ QW in goal to decrease Metformin use, he is having more loose stools and associated urgency Metformin 500m39me tablet related to GI side effects, will plan to d/c if BS running less than 120 154h Trulicity increase Taking Glyburide 4mg 35mCBG greater than 180. MUST CHECK BLOOD SUGAR at least BID -     Hemoglobin A1c Discussed general issues about diabetes pathophysiology and management., Educational material distributed., Suggested low cholesterol diet., Encouraged aerobic exercise., Discussed foot care., Reminded to get yearly retinal exam.  CKD stage 2 due to type 2 diabetes mellitus (HCC) [place]   CBC with Differential/Platelet -     COMPLETE METABOLIC PANEL WITH GFR -     TSH  -     Hemoglobin A1c -  urine routine with relfex microscopic - Microalbumin/creatinine urine ratio Stressed importance of diet and exercise, push fluids   Hyperlipidemia associated with type 2 diabetes mellitus (HCC) Life style controlled, discussed statin Discussed dietary and exercise modifications Low fat diet -     Lipid panel  Aortic atherosclerosis (HCC) by Abd CT scan ion 09/19/2019 Control blood pressure, lipids and glucose Disscused lifestyle modifications, diet & exercise Continue to monitor  BPH with obstruction/lower urinary tract symptoms Doing well at this time Continue medications:  Will continue to  monitor Defer PSA this check  Hepatic steatosis Discussed dietary and exercise modifications  OSA (obstructive sleep apnea) Continue CPAP/BiPAP, using nightly for at least 8 hours  Helping with daytime fatigue Weight loss still advised Discussed mask & tubing hygeine  Obesity (BMI 30.0-34.9) Discussed dietary and exercise modifications  Gastroesophageal reflux disease, unspecified whether esophagitis present Doing well at this time Continue:  Diet discussed Monitor for triggers Avoid food with high acid content Avoid excessive cafeine Increase water intake  Vitamin D deficiency Continue supplementation to maintain goal of 70-100 Taking Vitamin D 2,000 IU daily Defer vitamin D level  Flu Vaccine need High dose flu vaccine administered  Laceration of right hand without foreign body Tdap administered Advised to keep area clean and dry  Medication management Continued   Over 30 minutes of face to face exam, counseling, chart review, and complex, high/moderate level critical decision making was performed this visit.   Future Appointments  Date Time Provider [place]/2023 11:00 AM McKeoUnk Pinto[facility] None  08/22/2021  9:30 AM McKeoUnk Pinto[facility] None     HPI 74 y.49 obese male with history of DM2, CKD improved, gout, HLD, HTN, OSA, kidney stone s/p right ureteroscopy, laser lithotripsy and ureteral stent on 03/10 with Dr. WinteLovena Neighboursctive OA with elevated CRP/ESR, following with Dr. AryalKathlene Novembere for 3 month follow up.   His last OV was 06/2020 for CPE. 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  He has been following with Dr AlusiMaureen RalphsOsteoarthritis of bilateral knee joints.  He is planning to  have Total knee replacement, left on 5/22.  Knee pain has improved with goo range of motion.   He is currently on Olmesartan 72m QD and Amlodipine 136m1/2 tab daily.  BP's have been running in normal range.   BP Readings from Last 3 Encounters:  05/16/21 140/74  02/07/21 128/74  11/23/20 134/79    Pt cut himself on a rake yesterday. No signs of infection. He is not up to date on tetanus.  Patient had migratory arthralgias with elevated CRP/ESR, following with Dr. ArMegan SalonCurrenlty on Leflunomide 10 mg daily- will monitor liver function.     BMI is Body mass index is 35.37 kg/m., he is working on diet and exercise. Has not been watching his diet as much. Exercise is not as much. Wt Readings from Last 3 Encounters:  05/16/21 229 lb 3.2 oz (104 kg)  02/07/21 211 lb 6.4 oz (95.9 kg)  11/22/20 208 lb (94.3 kg)    The patient is diabetic and has his CDL.   He has hyperlipidemia, diet controlled He has CKD which improved with kidney stone removal and adjustment of medications.  He is on Trulicity 1.9.4WHnce a week, Metformin 50081m tab daily can not tolerate more due to loose stools frequent and urgency associated He is taking glyburide 5 mg a half pill at night occ if over 180, recently has had to use due to elevated blood sugars from steroid use. Patient does have hypoglycemia awareness.  He is checking his sugar maybe once a day  and his wife has been working very hard on with him.  She expresses frustration today.  He is on olmesartan 40 mg and amlodipine 10 mg 1/2 tab daily and flomax.  Wearing CPAP at night for OSA.  Lab Results  Component Value Date   HGBA1C 7.3 (H) 02/07/2021   Lab Results  Component Value Date   GFRNONAA >60 11/23/2020    Patient is on Vitamin D3 supplement for defciency (37, 2008).  Last result was not to goal.  Lab Results  Component Value Date   VD25OH 40 02/07/2021       Current Outpatient Medications (Endocrine & Metabolic):    Dulaglutide (TRULICITY) 1.5 MG/QP/5.9FMPN, Inject 1.5 mg into the skin once a week.   glyBURIDE (DIABETA) 5 MG tablet, Take 1/2 to 1 tablet     3 x /day       with Meals for Diabetes (Patient taking differently: Take 5 mg by  mouth daily as needed (blood sugar above 200).)   metFORMIN (GLUCOPHAGE XR) 500 MG 24 hr tablet, Take     1 to 2 tablets       2 x /day        with Meals      for Diabetes (Patient taking differently: Take 500 mg by mouth daily with breakfast.)  Current Outpatient Medications (Cardiovascular):    amLODipine (NORVASC) 10 MG tablet, Take  1/2 to 1 tablet  Daily  for BP (Patient taking differently: No sig reported)   atenolol (TENORMIN) 100 MG tablet, TAKE 1 TABLET BY MOUTH DAILY FOR BLOOD PRESSURE   olmesartan (BENICAR) 40 MG tablet, Take  1 tablet  Daily for BP & Diabetic Kidney Protection   Current Outpatient Medications (Analgesics):    acetaminophen (TYLENOL) 500 MG tablet, Take 1,000 mg by mouth every 6 (six) hours as needed for moderate pain.   leflunomide (ARAVA) 10 MG tablet, Take 10 mg by mouth daily.   traMADol (ULTRAM) 50 MG  tablet, Take 1-2 tablets (50-100 mg total) by mouth every 6 (six) hours as needed for moderate pain.   Current Outpatient Medications (Other):    Blood Glucose Monitoring Suppl (ONETOUCH VERIO) w/Device KIT, Test blood sugar once daily.   Blood Glucose Monitoring Suppl DEVI, Use meter three times a day to check blood sugar   famotidine (PEPCID) 40 MG tablet, Take     1 tablet      at Bedtime       for Indigestion & Acid Reflux (Patient taking differently: Take 40 mg by mouth daily.)   glucose blood (ONETOUCH VERIO) test strip, Test sugars 3 x a day for hypoglycemia/hyperglycemia and fluctuating sugars   glucose blood test strip, Test blood sugar three times a day   Lancets (ONETOUCH ULTRASOFT) lancets, Test blood sugar once daily   meclizine (ANTIVERT) 25 MG tablet, Take 25 mg by mouth 3 (three) times daily as needed for dizziness.   methocarbamol (ROBAXIN) 500 MG tablet, Take 1 tablet (500 mg total) by mouth every 6 (six) hours as needed for muscle spasms.   ondansetron (ZOFRAN) 8 MG tablet, Take 1/2 to 1 tablet 3 x day if needed for Nausea   oxybutynin  (DITROPAN) 5 MG tablet, Take 1 tablet (5 mg total) by mouth every 8 (eight) hours as needed for bladder spasms.   tamsulosin (FLOMAX) 0.4 MG CAPS capsule, TAKE 1 CAPSULE BY MOUTH AT BEDTIME FOR PROSTATE   B-D 3CC LUER-LOK SYR 21GX1" 21G X 1" 3 ML MISC, use as directed   gabapentin (NEURONTIN) 100 MG capsule, Take 1 to 2 capsules 2 to 3 x  /day as needed for Chronic Pain (Patient not taking: Reported on 05/16/2021)  Past Medical History:  Diagnosis Date    Covid-19 Positive (02/06/2019)  02/11/2019   Arthritis    BPH (benign prostatic hypertrophy)    Diabetes mellitus without complication ([place])    type w    GERD (gastroesophageal reflux disease)    Hepatic steatosis    History of kidney stones    Hydroureteronephrosis    Hypertension    Hypogonadism male    Mass    left shoulder AC joint   OSA (obstructive sleep apnea)    wears CPAP wears 4 hours per nite    PONV (postoperative nausea and vomiting)    Renal calculi    Sarcoidosis    Vitamin D deficiency      Allergies  Allergen Reactions   Ace Inhibitors Other (See Comments)    Unknown   Ibuprofen Other (See Comments)    GI upset   Invokana [Canagliflozin] Other (See Comments)    Unknown   Quinidine Other (See Comments)    Unknown   Immunization History  Administered Date(s) Administered   Fluad Quad(high Dose 65+) 04/28/2019   Influenza Split 06/23/2013   Influenza, High Dose Seasonal PF 06/29/2014, 05/24/2015, 04/24/2016, 06/11/2017, 07/08/2020   Influenza,inj,quad, With Preservative 04/23/2017   Influenza-Unspecified 04/28/2019   PFIZER(Purple Top)SARS-COV-2 Vaccination 08/23/2019, 09/09/2019, 09/13/2019   Pneumococcal Conjugate-13 01/05/2014   Pneumococcal-Unspecified 07/24/1994   Td 06/23/2002   Zoster, Live 10/23/2010    ROS: all negative except above.   Physical Exam: Filed Weights   05/16/21 0848  Weight: 229 lb 3.2 oz (104 kg)    BP 140/74   Pulse 74   Temp 97.9 F (36.6 C)   Wt 229 lb 3.2 oz  (104 kg)   SpO2 97%   BMI 35.37 kg/m    General Appearance: Well nourished, obese  male in no apparent distress, appears uncomfortable. Eyes: PERRLA, conjunctiva no swelling or erythema ENT/Mouth: Ext aud canals clear, TMs without erythema, bulging. Mask in place; oral exam. Hearing normal.   Neck: Supple, thyroid normal.  Respiratory: Respiratory effort normal, BS equal bilaterally, decreased breath sounds without rales, rhonchi, wheezing or stridor.  Cardio: RRR with no MRGs. Brisk peripheral pulses with 1+ edema bilateral legs with stasis dermatitis.  Abdomen: Soft, obese + BS.  Non tender, no guarding, rebound, hernias, masses. Lymphatics: Non tender without lymphadenopathy.  Musculoskeletal:  Gait is antalgic with a cane. Straight leg raising with dorsiflexion negative bilaterally for radicular symptoms. Strength is normal and symmetric in arms and legs. There is not SI tenderness to palpation.  Skin: Warm, dry . Right hand has 1-2 cm  laceration which is clean and intact, no signs of infection Neuro: Normal muscle tone, Sensation intact.  Psych: Awake and oriented X 3, normal affect, Insight and Judgment appropriate.      Magda Bernheim ANP-C  [facility]  05/16/2021

## 2021-05-16 ENCOUNTER — Encounter: Payer: Self-pay | Admitting: Nurse Practitioner

## 2021-05-16 ENCOUNTER — Other Ambulatory Visit: Payer: Self-pay

## 2021-05-16 ENCOUNTER — Encounter: Payer: Managed Care, Other (non HMO) | Admitting: Internal Medicine

## 2021-05-16 ENCOUNTER — Encounter: Payer: Self-pay | Admitting: Internal Medicine

## 2021-05-16 ENCOUNTER — Ambulatory Visit (INDEPENDENT_AMBULATORY_CARE_PROVIDER_SITE_OTHER): Payer: Managed Care, Other (non HMO) | Admitting: Nurse Practitioner

## 2021-05-16 VITALS — BP 140/74 | HR 74 | Temp 97.9°F | Wt 229.2 lb

## 2021-05-16 DIAGNOSIS — E1122 Type 2 diabetes mellitus with diabetic chronic kidney disease: Secondary | ICD-10-CM

## 2021-05-16 DIAGNOSIS — I10 Essential (primary) hypertension: Secondary | ICD-10-CM | POA: Diagnosis not present

## 2021-05-16 DIAGNOSIS — S61411A Laceration without foreign body of right hand, initial encounter: Secondary | ICD-10-CM | POA: Diagnosis not present

## 2021-05-16 DIAGNOSIS — N138 Other obstructive and reflux uropathy: Secondary | ICD-10-CM

## 2021-05-16 DIAGNOSIS — G4733 Obstructive sleep apnea (adult) (pediatric): Secondary | ICD-10-CM

## 2021-05-16 DIAGNOSIS — N401 Enlarged prostate with lower urinary tract symptoms: Secondary | ICD-10-CM

## 2021-05-16 DIAGNOSIS — E782 Mixed hyperlipidemia: Secondary | ICD-10-CM | POA: Diagnosis not present

## 2021-05-16 DIAGNOSIS — K760 Fatty (change of) liver, not elsewhere classified: Secondary | ICD-10-CM

## 2021-05-16 DIAGNOSIS — Z23 Encounter for immunization: Secondary | ICD-10-CM | POA: Diagnosis not present

## 2021-05-16 DIAGNOSIS — E1169 Type 2 diabetes mellitus with other specified complication: Secondary | ICD-10-CM | POA: Diagnosis not present

## 2021-05-16 DIAGNOSIS — E669 Obesity, unspecified: Secondary | ICD-10-CM

## 2021-05-16 DIAGNOSIS — E559 Vitamin D deficiency, unspecified: Secondary | ICD-10-CM

## 2021-05-16 DIAGNOSIS — K219 Gastro-esophageal reflux disease without esophagitis: Secondary | ICD-10-CM

## 2021-05-16 DIAGNOSIS — I700 Atherosclerosis of aorta: Secondary | ICD-10-CM

## 2021-05-16 DIAGNOSIS — N182 Chronic kidney disease, stage 2 (mild): Secondary | ICD-10-CM

## 2021-05-16 DIAGNOSIS — Z79899 Other long term (current) drug therapy: Secondary | ICD-10-CM

## 2021-05-16 MED ORDER — TRULICITY 3 MG/0.5ML ~~LOC~~ SOAJ: 3.0000 mg | SUBCUTANEOUS | 3 refills | Status: DC

## 2021-05-16 NOTE — Addendum Note (Signed)
Addended by: Arelia Sneddon on: 05/16/2021 10:17 AM   Modules accepted: Orders

## 2021-05-16 NOTE — Patient Instructions (Signed)
Trulicity increase to 3mg  once a week Monitor blood sugars.  If blood sugar is less than 120 can hold Metformin. Concentrate on increasing fresh fruits and vegetables and exercise

## 2021-05-17 LAB — COMPLETE METABOLIC PANEL WITH GFR
AG Ratio: 1.7 (calc) (ref 1.0–2.5)
ALT: 14 U/L (ref 9–46)
AST: 14 U/L (ref 10–35)
Albumin: 4 g/dL (ref 3.6–5.1)
Alkaline phosphatase (APISO): 67 U/L (ref 35–144)
BUN: 17 mg/dL (ref 7–25)
CO2: 27 mmol/L (ref 20–32)
Calcium: 9.1 mg/dL (ref 8.6–10.3)
Chloride: 100 mmol/L (ref 98–110)
Creat: 1.05 mg/dL (ref 0.70–1.28)
Globulin: 2.3 g/dL (calc) (ref 1.9–3.7)
Glucose, Bld: 187 mg/dL — ABNORMAL HIGH (ref 65–99)
Potassium: 4.3 mmol/L (ref 3.5–5.3)
Sodium: 138 mmol/L (ref 135–146)
Total Bilirubin: 0.4 mg/dL (ref 0.2–1.2)
Total Protein: 6.3 g/dL (ref 6.1–8.1)
eGFR: 75 mL/min/{1.73_m2} (ref >=60)

## 2021-05-17 LAB — LIPID PANEL
Cholesterol: 181 mg/dL (ref <200)
HDL: 42 mg/dL (ref >=40)
LDL Cholesterol (Calc): 99 mg/dL (calc)
Non-HDL Cholesterol (Calc): 139 mg/dL (calc) — ABNORMAL HIGH (ref <130)
Total CHOL/HDL Ratio: 4.3 (calc) (ref <5.0)
Triglycerides: 281 mg/dL — ABNORMAL HIGH (ref <150)

## 2021-05-17 LAB — URINALYSIS, ROUTINE W REFLEX MICROSCOPIC
Bacteria, UA: NONE SEEN /HPF
Bilirubin Urine: NEGATIVE
Glucose, UA: NEGATIVE
Hgb urine dipstick: NEGATIVE
Hyaline Cast: NONE SEEN /LPF
Ketones, ur: NEGATIVE
Leukocytes,Ua: NEGATIVE
Nitrite: NEGATIVE
RBC / HPF: NONE SEEN /HPF (ref 0–2)
Specific Gravity, Urine: 1.02 (ref 1.001–1.035)
Squamous Epithelial / HPF: NONE SEEN /HPF (ref <=5)
WBC, UA: NONE SEEN /HPF (ref 0–5)
pH: 5.5 (ref 5.0–8.0)

## 2021-05-17 LAB — CBC WITH DIFFERENTIAL/PLATELET
Absolute Monocytes: 608 cells/uL (ref 200–950)
Basophils Absolute: 68 cells/uL (ref 0–200)
Basophils Relative: 0.9 %
Eosinophils Absolute: 158 cells/uL (ref 15–500)
Eosinophils Relative: 2.1 %
HCT: 43.3 % (ref 38.5–50.0)
Hemoglobin: 13.9 g/dL (ref 13.2–17.1)
Lymphs Abs: 2265 cells/uL (ref 850–3900)
MCH: 26.2 pg — ABNORMAL LOW (ref 27.0–33.0)
MCHC: 32.1 g/dL (ref 32.0–36.0)
MCV: 81.7 fL (ref 80.0–100.0)
MPV: 9.8 fL (ref 7.5–12.5)
Monocytes Relative: 8.1 %
Neutro Abs: 4403 cells/uL (ref 1500–7800)
Neutrophils Relative %: 58.7 %
Platelets: 186 10*3/uL (ref 140–400)
RBC: 5.3 10*6/uL (ref 4.20–5.80)
RDW: 14.4 % (ref 11.0–15.0)
Total Lymphocyte: 30.2 %
WBC: 7.5 10*3/uL (ref 3.8–10.8)

## 2021-05-17 LAB — HEMOGLOBIN A1C
Hgb A1c MFr Bld: 8.1 % of total Hgb — ABNORMAL HIGH (ref <5.7)
Mean Plasma Glucose: 186 mg/dL
eAG (mmol/L): 10.3 mmol/L

## 2021-05-17 LAB — MICROSCOPIC MESSAGE

## 2021-05-17 LAB — MICROALBUMIN / CREATININE URINE RATIO
Creatinine, Urine: 121 mg/dL (ref 20–320)
Microalb Creat Ratio: 40 mcg/mg creat — ABNORMAL HIGH (ref <30)
Microalb, Ur: 4.9 mg/dL

## 2021-05-17 LAB — VITAMIN D 25 HYDROXY (VIT D DEFICIENCY, FRACTURES): Vit D, 25-Hydroxy: 37 ng/mL (ref 30–100)

## 2021-05-17 LAB — TSH: TSH: 1.68 mIU/L (ref 0.40–4.50)

## 2021-05-23 ENCOUNTER — Other Ambulatory Visit: Payer: Self-pay

## 2021-05-23 ENCOUNTER — Telehealth: Payer: Self-pay

## 2021-05-23 DIAGNOSIS — K2100 Gastro-esophageal reflux disease with esophagitis, without bleeding: Secondary | ICD-10-CM

## 2021-05-23 DIAGNOSIS — N182 Chronic kidney disease, stage 2 (mild): Secondary | ICD-10-CM

## 2021-05-23 DIAGNOSIS — E1122 Type 2 diabetes mellitus with diabetic chronic kidney disease: Secondary | ICD-10-CM

## 2021-05-23 MED ORDER — METFORMIN HCL ER 500 MG PO TB24
ORAL_TABLET | ORAL | 3 refills | Status: DC
Start: 2021-05-23 — End: 2022-07-13

## 2021-05-23 MED ORDER — FAMOTIDINE 40 MG PO TABS
ORAL_TABLET | ORAL | 0 refills | Status: DC
Start: 2021-05-23 — End: 2022-01-05

## 2021-05-23 NOTE — Addendum Note (Signed)
Addended by: Arelia Sneddon on: 05/23/2021 02:37 PM   Modules accepted: Orders

## 2021-05-23 NOTE — Telephone Encounter (Signed)
Prior auth for Trulicity 3mg /0.53ml pen injector approved through 05/19/22.

## 2021-05-24 IMAGING — CR DG ABDOMEN 1V
2 series · 2 of 2 positions shown · non-contrast
Comparison: CT abdomen and pelvis 07/16/2016

CLINICAL DATA: RIGHT low back and flank pain since July 2019;
history diabetes mellitus, GERD, hypertension, umbilical hernia
repair, sarcoidosis

EXAM:
ABDOMEN - 1 VIEW

[t abdomen supine (1 of 2)]
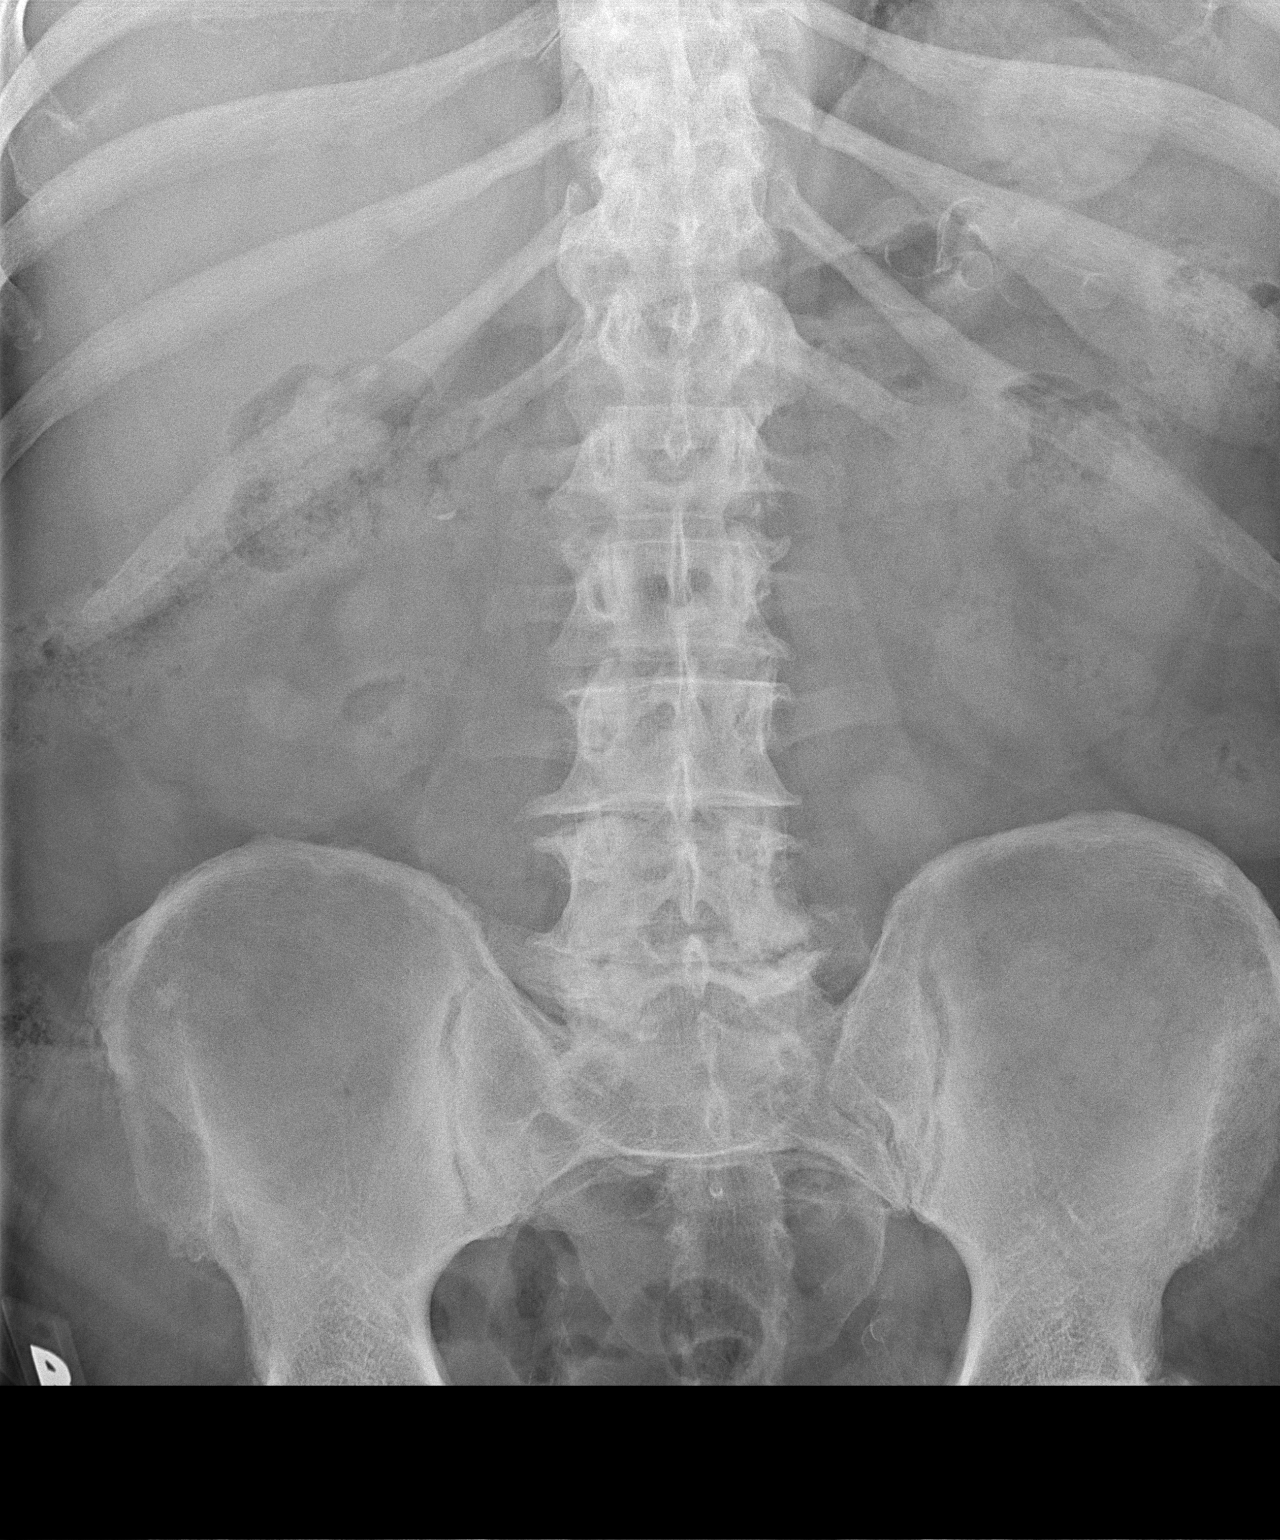

[t abdomen supine (2 of 2)]
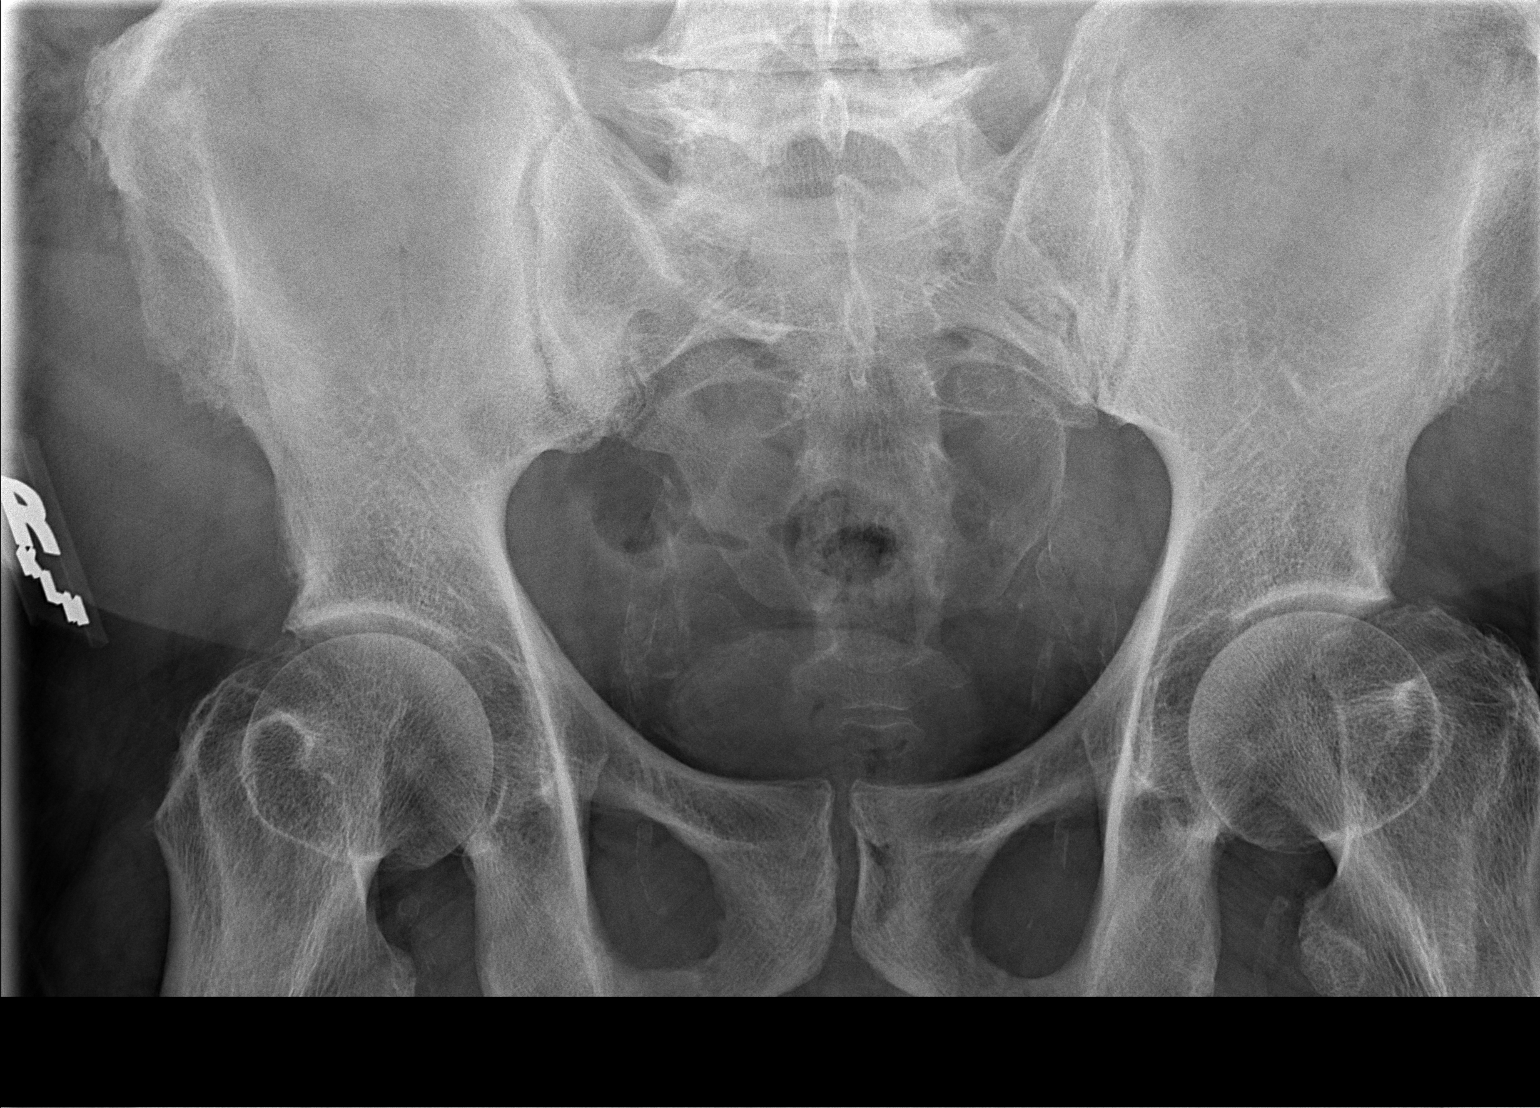

[2 of 2 positions shown; findings below may reference images not displayed]

FINDINGS: Scattered vascular calcifications.

Small curvilinear calcification RIGHT paraspinal at L1-L2 unchanged
from prior CT.

No urinary tract calcifications.

Degenerative disc disease changes lumbar spine.

Bowel gas pattern normal.
IMPRESSION: No acute abnormalities.

## 2021-06-13 ENCOUNTER — Other Ambulatory Visit: Payer: Self-pay | Admitting: Internal Medicine

## 2021-06-20 IMAGING — US US RENAL
1 series · 14 of 25 positions shown · non-contrast
Comparison: Prior CT from 09/19/2019.

CLINICAL DATA: Initial evaluation for stage 3 chronic kidney
disease.

EXAM:
RENAL / URINARY TRACT ULTRASOUND COMPLETE

[Series 1: us renal · 0.25mm/px · 14 of 37 slices shown]
[im 1/37]
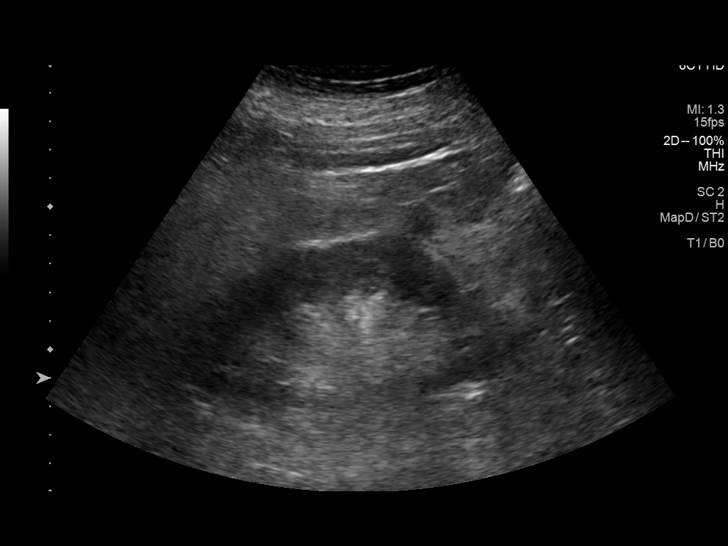
[im 4/37]
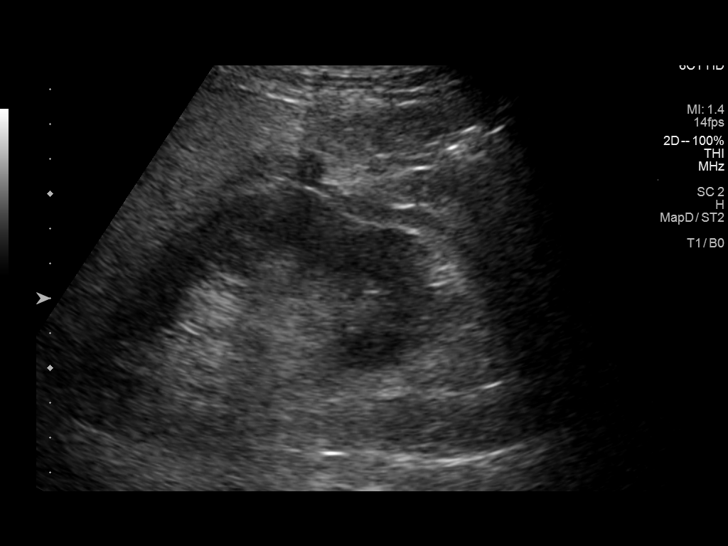
[im 7/37]
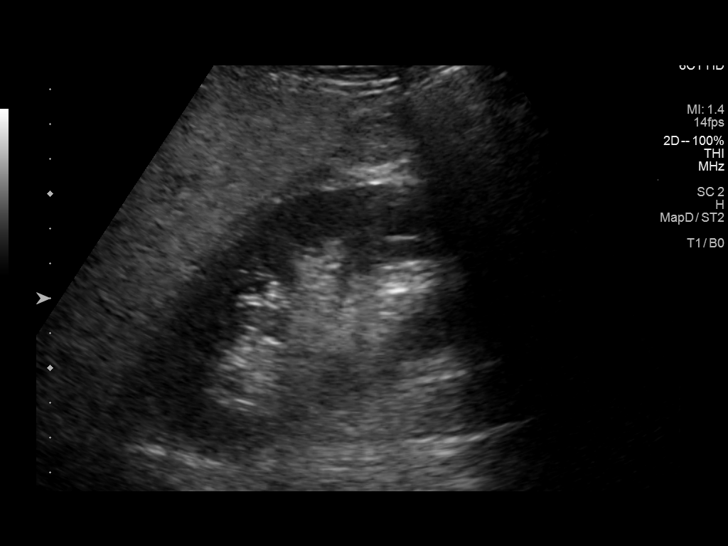
[im 10/37]
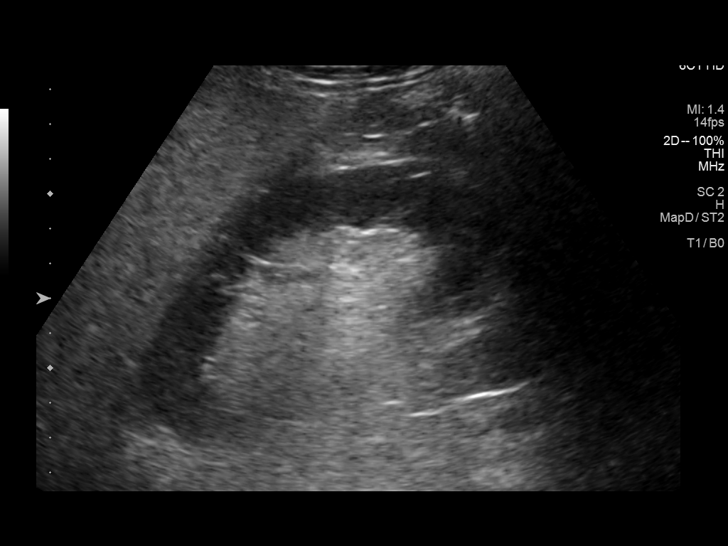
[im 13/37]
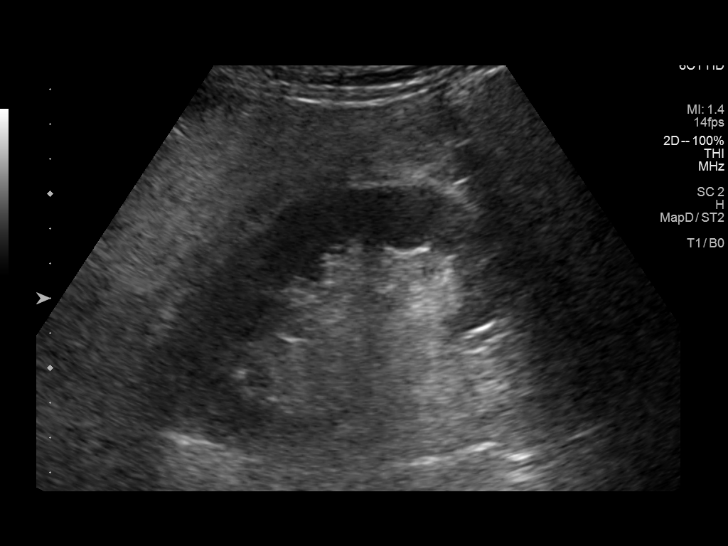
[im 14/37]
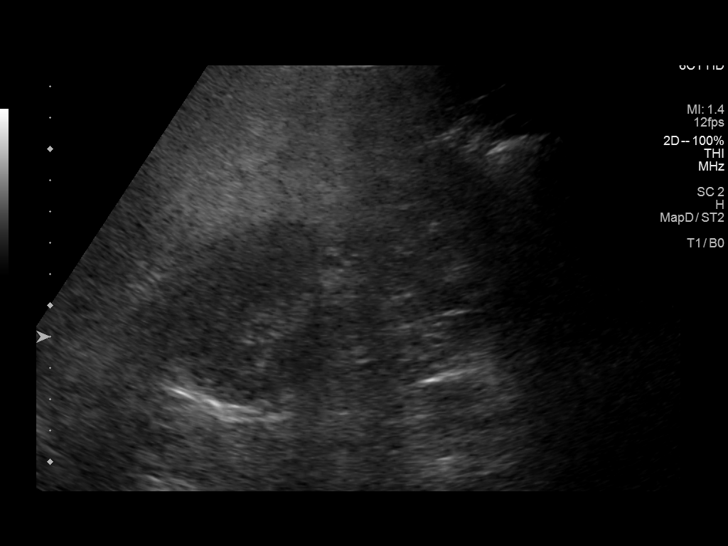
[im 17/37]
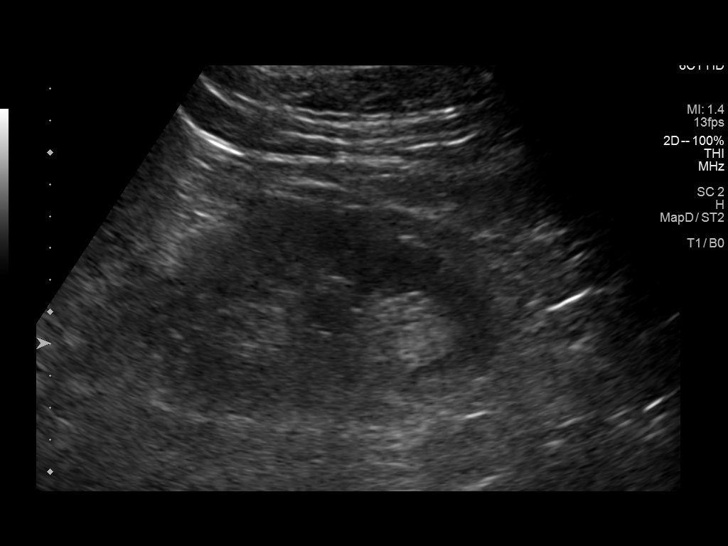
[im 20/37]
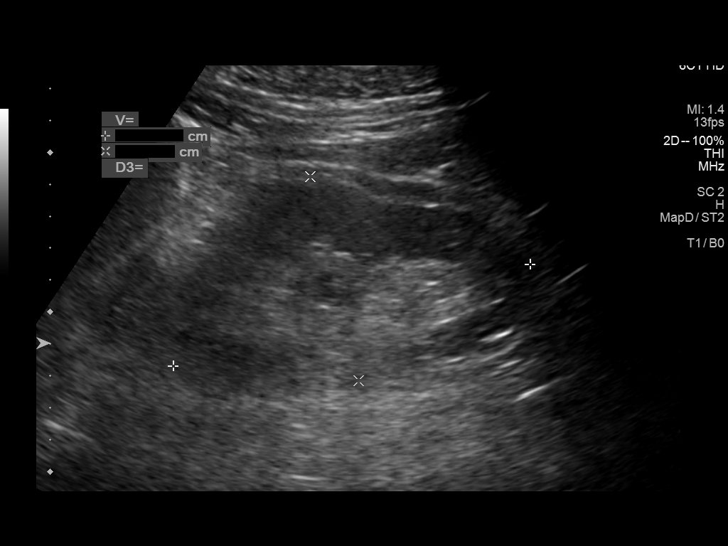
[im 23/37]
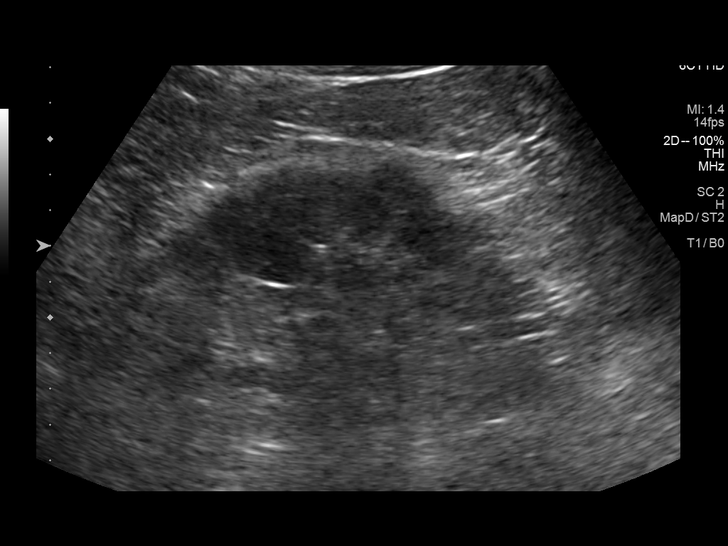
[im 25/37]
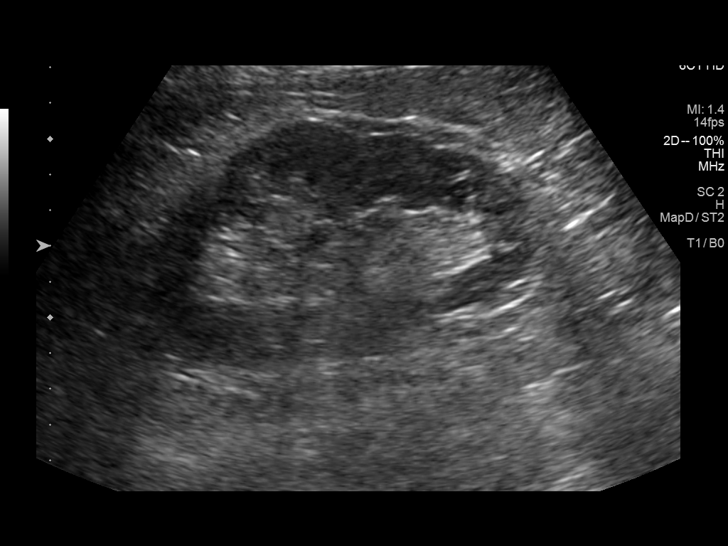
[im 28/37]
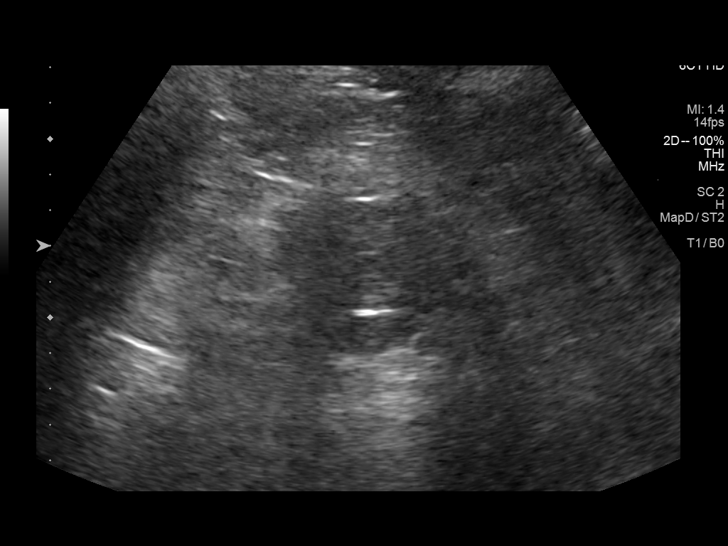
[im 31/37]
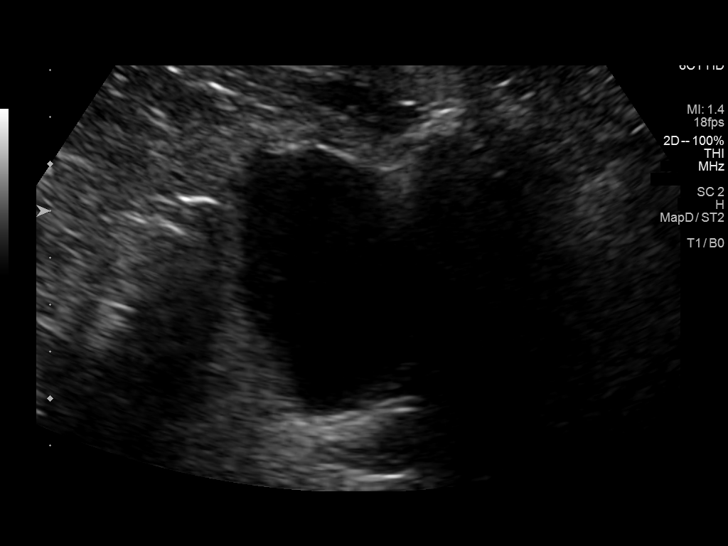
[im 34/37]
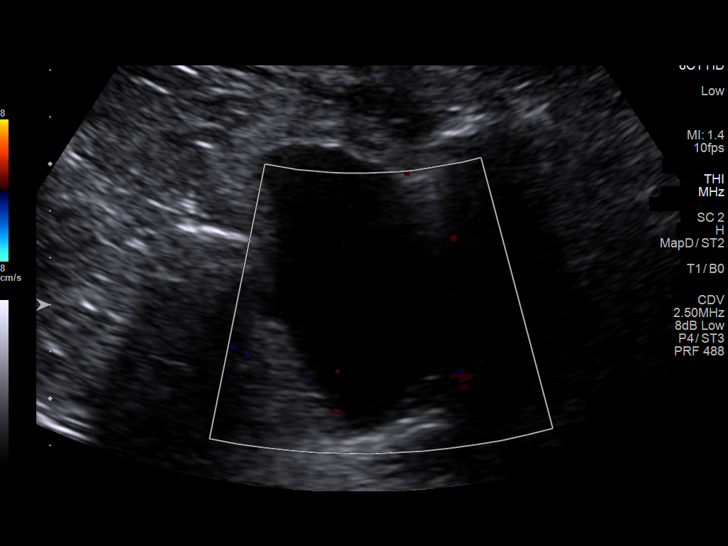
[im 37/37]
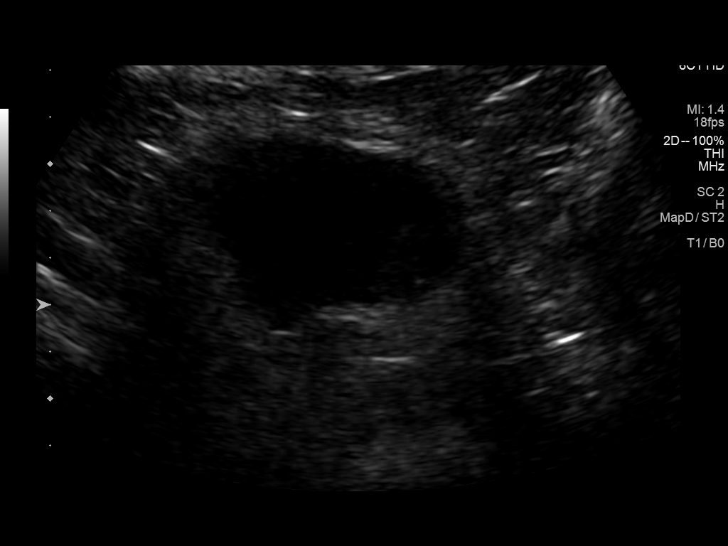

[14 of 25 positions shown; findings below may reference images not displayed]

FINDINGS: Right Kidney:

Renal measurements: 11.2 x 5.8 x 6.3 cm = volume: 215.0 mL.
Echogenicity within normal limits. No nephrolithiasis or
hydronephrosis. 1.2 cm simple exophytic cyst extends from the upper
pole.

Left Kidney:

Renal measurements: 11.6 x 6.6 x 6.2 cm = volume: 246.4 mL.
Echogenicity within normal limits. No nephrolithiasis or
hydronephrosis. No focal renal mass.

Bladder:

Appears normal for degree of bladder distention.

Other:

None.
IMPRESSION: 1. No hydronephrosis.  Renal echogenicity within normal limits.
2. 1.2 cm simple right renal cyst.

## 2021-07-25 NOTE — Progress Notes (Signed)
Annual  Screening/Preventative Visit  & Comprehensive Evaluation & Examination  Future Appointments  Date Time Provider Department  07/26/2021    CPE e 11:00 AM Unk Pinto, MD [facility]  08/22/2021  9:30 AM Unk Pinto, MD [facility]  07/31/2022 11:00 AM Unk Pinto, MD [facility]            This very nice 76 y.o. MWM presents for a Screening /Preventative Visit & comprehensive evaluation and management of multiple medical co-morbidities.  Patient has been followed for HTN, HLD, T2_NIDDM  and Vitamin D Deficiency. Patient is on CPAP for OSA with improved sleep hygiene.    On 09/19/2019 showed Aortic atherosclerosis. Patient has GERD controlled on hid meds       HTN predates since 86. Patient's BP has been controlled at home.  Today's BP is at goal - 136/78. Patient denies any cardiac symptoms as chest pain, palpitations, shortness of breath, dizziness or ankle swelling.       Patient's hyperlipidemia is controlled with diet and medications   (except elevated Trifg"::                  Lab Results  Component Value Date   CHOL 181 05/16/2021   HDL 42 05/16/2021   LDLCALC 99 05/16/2021   TRIG 281 (H) 05/16/2021   CHOLHDL 4.3 05/16/2021                               Also, the patient has history of T2_NIDDM (1997) w/CKD2 (GFR 75)  and has had no symptoms of reactive hypoglycemia, diabetic polys, paresthesias or visual blurring.  Last A1c was not at goal:  Lab Results  Component Value Date   HGBA1C 8.1 (H) 05/16/2021     ROS Constitutional: Denies fever, chills, weight loss/gain, headaches, insomnia,  night sweats or change in appetite. Does c/o fatigue. Eyes: Denies redness, blurred vision, diplopia, discharge, itchy or watery eyes.  ENT: Denies discharge, congestion, post nasal drip, epistaxis, sore throat, earache, hearing loss, dental pain, Tinnitus, Vertigo, Sinus pain or snoring.  Cardio: Denies chest pain, palpitations, irregular heartbeat, syncope, dyspnea,  diaphoresis, orthopnea, PND, claudication or edema Respiratory: denies cough, dyspnea, DOE, pleurisy, hoarseness, laryngitis or wheezing.  Gastrointestinal: Denies dysphagia, heartburn, reflux, water brash, pain, cramps, nausea, vomiting, bloating, diarrhea, constipation, hematemesis, melena, hematochezia, jaundice or hemorrhoids Genitourinary: Denies dysuria, frequency, urgency, nocturia, hesitancy, discharge, hematuria or flank pain Musculoskeletal: Denies arthralgia, myalgia, stiffness, Jt. Swelling, pain, limp or strain/sprain. Denies Falls. Skin: Denies puritis, rash, hives, warts, acne, eczema or change in skin lesion Neuro: No weakness, tremor, incoordination, spasms, paresthesia or pain Psychiatric: Denies confusion, memory loss or sensory loss. Denies Depression. Endocrine: Denies change in weight, skin, hair change, nocturia, and paresthesia, diabetic polys, visual blurring or hyper / hypo glycemic episodes.  Heme/Lymph: No excessive bleeding, bruising or enlarged lymph nodes.   Physical Exam  BP 136/78    Pulse 82    Temp 97.8 F (36.6 C)    Resp 16    Ht 5' 7.5" (1.715 m)    Wt 227 lb 3.2 oz (103.1 kg)    SpO2 96%    BMI 35.06 kg/m   General Appearance: Over  nourished and well groomed and in no apparent distress.  Eyes: PERRLA, EOMs, conjunctiva no swelling or erythema, normal fundi and vessels. Sinuses: No frontal/maxillary tenderness ENT/Mouth: EACs patent / TMs  nl. Nares clear without erythema, swelling, mucoid exudates. Oral hygiene is  good. No erythema, swelling, or exudate. Tongue normal, non-obstructing. Tonsils not swollen or erythematous. Hearing normal.  Neck: Supple, thyroid not palpable. No bruits, nodes or JVD. Respiratory: Respiratory effort normal.  BS equal and clear bilateral without rales, rhonci, wheezing or stridor. Cardio: Heart sounds are normal with regular rate and rhythm and no murmurs, rubs or gallops. Peripheral pulses are normal and equal bilaterally  without edema. No aortic or femoral bruits. Chest: symmetric with normal excursions and percussion.  Abdomen: Soft, with Nl bowel sounds. Nontender, no guarding, rebound, hernias, masses, or organomegaly.  Lymphatics: Non tender without lymphadenopathy.  Musculoskeletal: Full ROM all peripheral extremities, joint stability, 5/5 strength, and normal gait. Skin: Warm and dry without rashes, lesions, cyanosis, clubbing or  ecchymosis.  Neuro: Cranial nerves intact, reflexes equal bilaterally. Normal muscle tone, no cerebellar symptoms. Sensation intact.  Pysch: Alert and oriented X 3 with normal affect, insight and judgment appropriate.   Assessment and Plan  1. Annual Preventative/Screening Exam    2. Essential hypertension  - EKG 12-Lead - [place], RETROPERITNL ABD,  LTD - Urinalysis, Routine w reflex microscopic - Microalbumin / creatinine urine ratio - POC Hemoccult Bld/Stl (3-Cd Home Screen); Future - CBC with Differential/Platelet - COMPLETE METABOLIC PANEL WITH GFR - Magnesium - TSH  3. Hyperlipidemia associated with type 2 diabetes mellitus ([place])  - EKG 12-Lead - [place], RETROPERITNL ABD,  LTD - Lipid panel - TSH  4. Type 2 diabetes mellitus with stage 2 chronic kidney  disease, without long-term current use of insulin (HCC)  - [place], RETROPERITNL ABD,  LTD - HM DIABETES FOOT EXAM - PR LOW EXTEMITY NEUR EXAM DOCUM - Hemoglobin A1c - Insulin, random  5. Vitamin D deficiency  - VITAMIN D 25 Hydroxy (Vit-D Deficiency, Fractures)  6. Aortic atherosclerosis ([place]) by Abd CT scan ion 09/19/2019  - EKG 12-Lead - [place], RETROPERITNL ABD,  LTD - Lipid panel  7. Gastroesophageal reflux disease   - CBC with Differential/Platelet  8. OSA (obstructive sleep apnea)   9. BPH with obstruction/lower urinary tract symptoms  - PSA  10. Prostate cancer screening  - PSA  11. Screening for colorectal cancer   12. Screening for ischemic heart disease  - EKG 12-Lead  13. FH:  hypertension  - EKG 12-Lead - [place], RETROPERITNL ABD,  LTD  14. Screening for AAA (aortic abdominal aneurysm)  - EKG 12-Lead - [place], RETROPERITNL ABD,  LTD  15. Medication management  - Urinalysis, Routine w reflex microscopic - Microalbumin / creatinine urine ratio - CBC with Differential/Platelet - COMPLETE METABOLIC PANEL WITH GFR - Magnesium - Lipid panel - TSH - Hemoglobin A1c - Insulin, random - VITAMIN D 25 Hydroxy           Patient was counseled in prudent diet, weight control to achieve/maintain BMI less than 25, BP monitoring, regular exercise and medications as discussed.  Discussed med effects and SE's. Routine screening labs and tests as requested with regular follow-up as recommended. Over 40 minutes of exam, counseling, chart review and high complex critical decision making was performed

## 2021-07-25 NOTE — Patient Instructions (Signed)

## 2021-07-26 ENCOUNTER — Other Ambulatory Visit: Payer: Self-pay

## 2021-07-26 ENCOUNTER — Encounter: Payer: Self-pay | Admitting: Internal Medicine

## 2021-07-26 ENCOUNTER — Ambulatory Visit: Payer: Managed Care, Other (non HMO) | Admitting: Internal Medicine

## 2021-07-26 VITALS — BP 136/78 | HR 82 | Temp 97.8°F | Resp 16 | Ht 67.5 in | Wt 227.2 lb

## 2021-07-26 DIAGNOSIS — R350 Frequency of micturition: Secondary | ICD-10-CM | POA: Diagnosis not present

## 2021-07-26 DIAGNOSIS — E559 Vitamin D deficiency, unspecified: Secondary | ICD-10-CM | POA: Diagnosis not present

## 2021-07-26 DIAGNOSIS — Z13220 Encounter for screening for lipoid disorders: Secondary | ICD-10-CM

## 2021-07-26 DIAGNOSIS — I700 Atherosclerosis of aorta: Secondary | ICD-10-CM | POA: Diagnosis not present

## 2021-07-26 DIAGNOSIS — Z0001 Encounter for general adult medical examination with abnormal findings: Secondary | ICD-10-CM

## 2021-07-26 DIAGNOSIS — Z0000 Encounter for general adult medical examination without abnormal findings: Secondary | ICD-10-CM

## 2021-07-26 DIAGNOSIS — N401 Enlarged prostate with lower urinary tract symptoms: Secondary | ICD-10-CM | POA: Diagnosis not present

## 2021-07-26 DIAGNOSIS — Z131 Encounter for screening for diabetes mellitus: Secondary | ICD-10-CM

## 2021-07-26 DIAGNOSIS — Z79899 Other long term (current) drug therapy: Secondary | ICD-10-CM | POA: Diagnosis not present

## 2021-07-26 DIAGNOSIS — Z125 Encounter for screening for malignant neoplasm of prostate: Secondary | ICD-10-CM

## 2021-07-26 DIAGNOSIS — E1122 Type 2 diabetes mellitus with diabetic chronic kidney disease: Secondary | ICD-10-CM

## 2021-07-26 DIAGNOSIS — Z136 Encounter for screening for cardiovascular disorders: Secondary | ICD-10-CM | POA: Diagnosis not present

## 2021-07-26 DIAGNOSIS — N138 Other obstructive and reflux uropathy: Secondary | ICD-10-CM

## 2021-07-26 DIAGNOSIS — I10 Essential (primary) hypertension: Secondary | ICD-10-CM | POA: Diagnosis not present

## 2021-07-26 DIAGNOSIS — E1169 Type 2 diabetes mellitus with other specified complication: Secondary | ICD-10-CM

## 2021-07-26 DIAGNOSIS — Z8249 Family history of ischemic heart disease and other diseases of the circulatory system: Secondary | ICD-10-CM

## 2021-07-26 DIAGNOSIS — K2100 Gastro-esophageal reflux disease with esophagitis, without bleeding: Secondary | ICD-10-CM

## 2021-07-26 DIAGNOSIS — G4733 Obstructive sleep apnea (adult) (pediatric): Secondary | ICD-10-CM

## 2021-07-26 DIAGNOSIS — E785 Hyperlipidemia, unspecified: Secondary | ICD-10-CM

## 2021-07-26 DIAGNOSIS — N182 Chronic kidney disease, stage 2 (mild): Secondary | ICD-10-CM

## 2021-07-26 DIAGNOSIS — Z1389 Encounter for screening for other disorder: Secondary | ICD-10-CM | POA: Diagnosis not present

## 2021-07-26 DIAGNOSIS — Z1211 Encounter for screening for malignant neoplasm of colon: Secondary | ICD-10-CM

## 2021-07-27 LAB — LIPID PANEL
Cholesterol: 180 mg/dL (ref <200)
HDL: 37 mg/dL — ABNORMAL LOW (ref >=40)
Non-HDL Cholesterol (Calc): 143 mg/dL (calc) — ABNORMAL HIGH (ref <130)
Total CHOL/HDL Ratio: 4.9 (calc) (ref <5.0)
Triglycerides: 419 mg/dL — ABNORMAL HIGH (ref <150)

## 2021-07-27 LAB — CBC WITH DIFFERENTIAL/PLATELET
Absolute Monocytes: 511 cells/uL (ref 200–950)
Basophils Absolute: 57 cells/uL (ref 0–200)
Basophils Relative: 0.8 %
Eosinophils Absolute: 149 cells/uL (ref 15–500)
Eosinophils Relative: 2.1 %
HCT: 43.9 % (ref 38.5–50.0)
Hemoglobin: 14.4 g/dL (ref 13.2–17.1)
Lymphs Abs: 1548 cells/uL (ref 850–3900)
MCH: 27.1 pg (ref 27.0–33.0)
MCHC: 32.8 g/dL (ref 32.0–36.0)
MCV: 82.5 fL (ref 80.0–100.0)
MPV: 9.5 fL (ref 7.5–12.5)
Monocytes Relative: 7.2 %
Neutro Abs: 4835 cells/uL (ref 1500–7800)
Neutrophils Relative %: 68.1 %
Platelets: 199 10*3/uL (ref 140–400)
RBC: 5.32 10*6/uL (ref 4.20–5.80)
RDW: 14.1 % (ref 11.0–15.0)
Total Lymphocyte: 21.8 %
WBC: 7.1 10*3/uL (ref 3.8–10.8)

## 2021-07-27 LAB — MICROALBUMIN / CREATININE URINE RATIO
Creatinine, Urine: 118 mg/dL (ref 20–320)
Microalb Creat Ratio: 36 mcg/mg creat — ABNORMAL HIGH (ref <30)
Microalb, Ur: 4.2 mg/dL

## 2021-07-27 LAB — COMPLETE METABOLIC PANEL WITH GFR
AG Ratio: 1.7 (calc) (ref 1.0–2.5)
ALT: 14 U/L (ref 9–46)
AST: 14 U/L (ref 10–35)
Albumin: 4 g/dL (ref 3.6–5.1)
Alkaline phosphatase (APISO): 70 U/L (ref 35–144)
BUN: 17 mg/dL (ref 7–25)
CO2: 28 mmol/L (ref 20–32)
Calcium: 9.4 mg/dL (ref 8.6–10.3)
Chloride: 100 mmol/L (ref 98–110)
Creat: 1.1 mg/dL (ref 0.70–1.28)
Globulin: 2.3 g/dL (calc) (ref 1.9–3.7)
Glucose, Bld: 250 mg/dL — ABNORMAL HIGH (ref 65–99)
Potassium: 4.5 mmol/L (ref 3.5–5.3)
Sodium: 137 mmol/L (ref 135–146)
Total Bilirubin: 0.5 mg/dL (ref 0.2–1.2)
Total Protein: 6.3 g/dL (ref 6.1–8.1)
eGFR: 70 mL/min/{1.73_m2} (ref >=60)

## 2021-07-27 LAB — URINALYSIS, ROUTINE W REFLEX MICROSCOPIC
Bilirubin Urine: NEGATIVE
Hgb urine dipstick: NEGATIVE
Leukocytes,Ua: NEGATIVE
Nitrite: NEGATIVE
Protein, ur: NEGATIVE
Specific Gravity, Urine: 1.019 (ref 1.001–1.035)
pH: 5.5 (ref 5.0–8.0)

## 2021-07-27 LAB — HEMOGLOBIN A1C
Hgb A1c MFr Bld: 8.9 % of total Hgb — ABNORMAL HIGH (ref <5.7)
Mean Plasma Glucose: 209 mg/dL
eAG (mmol/L): 11.6 mmol/L

## 2021-07-27 LAB — MAGNESIUM: Magnesium: 1.7 mg/dL (ref 1.5–2.5)

## 2021-07-27 LAB — TSH: TSH: 1.47 mIU/L (ref 0.40–4.50)

## 2021-07-27 LAB — VITAMIN D 25 HYDROXY (VIT D DEFICIENCY, FRACTURES): Vit D, 25-Hydroxy: 35 ng/mL (ref 30–100)

## 2021-07-27 LAB — INSULIN, RANDOM: Insulin: 43.1 u[IU]/mL — ABNORMAL HIGH

## 2021-07-27 LAB — PSA: PSA: 0.36 ng/mL (ref <=4.00)

## 2021-07-27 NOTE — Progress Notes (Signed)
============================================================ ============================================================  -  PSA - Very Low - Great ! ============================================================ ============================================================  - Glucose = 250 mg mg % - Is way too high                           (Ideal is less than 120 mg% )  ============================================================ ============================================================  -  Magnesium  -   1.7   -  very  low- goal is betw 2.0 - 2.5,   - So..............Marland Kitchen  Recommend that you take  Magnesium 500 mg tablet daily   - also important to eat lots of  leafy green vegetables   - spinach - Kale - collards - greens - okra - asparagus  - broccoli - quinoa - squash - almonds   - black, red, white beans  -  peas - green beans ============================================================ ============================================================  - Total Chol = 180  - Wonderful   - Very low risk for Heart Attack  / Stroke ============================================================ ============================================================  -  But - Triglycerides (   419   ) or fats in blood are  way way too high !                    ( Ideal or Goal  is less than 150  !  )    - Recommend avoid fried & greasy foods,  sweets / candy,   - Avoid white rice  (brown or wild rice or Quinoa is OK),   - Avoid white potatoes  (sweet potatoes are OK)   - Avoid anything made from white flour  - bagels, doughnuts, rolls, buns, biscuits, white and   wheat breads, pizza crust and traditional  pasta made of white flour & egg white  - (vegetarian pasta or spinach or wheat pasta is OK).    - Multi-grain bread is OK - like multi-grain flat bread or  sandwich thins.   - Avoid alcohol in excess.   - Exercise is also  important. ============================================================ ============================================================   -  A1c = 8.9% - Worse -  (Ideal or Goal is less than 5.7%    !  )  -  Your blood sugar and A1c are elevated.    -  Being diabetic has a  300% increased risk for heart attack,  stroke, cancer, and alzheimer- type vascular dementia.   -  It is very important that you work harder with diet by                       avoiding all foods that are white except chicken, fish & calliflower.  - Avoid white rice  (brown & wild rice is OK),   - Avoid white potatoes  (sweet potatoes in moderation is OK),   White bread or wheat bread or anything made out of   white flour like bagels, donuts, rolls, buns, biscuits, cakes,  - pastries, cookies, pizza crust, and pasta (made from  white flour & egg whites)   - vegetarian pasta or spinach or wheat pasta is OK.  - Multigrain breads like Arnold's, Pepperidge Farm or   multigrain sandwich thins or high fiber breads like   [place] bread or "Dave's Killer" breads that are  4 to 5 grams fiber per slice !  are best.    Diet, exercise and weight loss can reverse and cure  diabetes in the early stages.    - Diet, exercise and weight  loss is very important in the   control and prevention of complications of diabetes which  affects every system in your body, ie.   -Brain - dementia/stroke,  - eyes - glaucoma/blindness,  - heart - heart attack/heart failure,  - kidneys - dialysis,  - stomach - gastric paralysis,  - intestines - malabsorption,  - nerves - severe painful neuritis,  - circulation - gangrene & loss of a leg(s)  - and finally  . . . . . . . . . . . . . . . . . .    - cancer and Alzheimers. ============================================================ ============================================================  - Vitamin D = 35      -     is    STILL       Dangerously Low - Appears that                                                                       you're not taking any Vitamin D   - - Vitamin D goal is between 70-100.   - STRONGLY RECOMMEND   that you take Vitamin D 10,000 units  /day                                             (ie., Take 2 capsules of Vit D 5,000 = 10,000 / day   - It is very important as a natural anti-inflammatory and helping the  immune system protect against viral infections, like the Covid-19    helping hair, skin, and nails, as well as reducing stroke and  heart attack risk.   - It helps your bones and helps with mood.  - It also decreases numerous cancer risks so please  take it as directed.   - Low Vit D is associated with a 200-300% higher risk for  CANCER   and 200-300% higher risk for HEART   ATTACK  &  STROKE.    - It is also associated with higher death rate at younger ages,   autoimmune diseases like Rheumatoid arthritis, Lupus,  Multiple Sclerosis.     - Also many other serious conditions, like depression, Alzheimer's  Dementia, infertility, muscle aches, fatigue, fibromyalgia   - just to name a few. ============================================================ ============================================================  - All Else - CBC - Kidneys - Electrolytes - Liver - Magnesium & Thyroid    - all  Normal / OK ============================================================ ============================================================

## 2021-07-29 NOTE — Progress Notes (Signed)
PATIENT'S WIFE IS AWARE OF LAB RESULTS AND INSTRUCTIONS. Phillip Powell [facility]

## 2021-08-22 ENCOUNTER — Ambulatory Visit: Payer: Managed Care, Other (non HMO) | Admitting: Internal Medicine

## 2021-09-15 ENCOUNTER — Other Ambulatory Visit: Payer: Self-pay | Admitting: Nurse Practitioner

## 2021-09-15 DIAGNOSIS — I10 Essential (primary) hypertension: Secondary | ICD-10-CM

## 2021-09-16 ENCOUNTER — Other Ambulatory Visit: Payer: Self-pay

## 2021-09-16 DIAGNOSIS — E1122 Type 2 diabetes mellitus with diabetic chronic kidney disease: Secondary | ICD-10-CM

## 2021-09-16 MED ORDER — ONETOUCH VERIO VI STRP: ORAL_STRIP | 12 refills | Status: DC

## 2021-11-12 NOTE — Progress Notes (Signed)
[facility] UP 3 MONTH ? ? ?Assessment and Plan: ?  ?Encounter for Medicare Annual Wellness Visit ?Due Annually ? ?Essential hypertension ?Change the way you take your medications: Rx Olmesartan 223m every morning and Atenolol 1013mevery night/bedtime.  Monitor blood pressure ?DECREASE SODIUM intake ?Monitor blood pressure at home; call if consistently over 130/80 ?Continue DASH diet.   ?Reminder to go to the ER if any CP, SOB, nausea, dizziness, severe HA, changes vision/speech, left arm numbness and tingling and jaw pain. ? ? ?Type 2 diabetes mellitus with stage 2 chronic kidney disease, without long-term current use of insulin (HCVinton?Trulicity 1.5 mg QW and Metformin 500 mg QD- could not tolerate higher doses of either due to GI side effects ?Taking Glyburide 23m75ms CBG greater than 180. ?MUST CHECK BLOOD SUGAR at least BID ?-     Hemoglobin A1c ?Discussed general issues about diabetes pathophysiology and management., Educational material distributed., Suggested low cholesterol diet., Encouraged aerobic exercise., Discussed foot care., Reminded to get yearly retinal exam. ? ?CKD stage 2 due to type 2 diabetes mellitus (HCC) ?-     CBC with Differential/Platelet ?-     COMPLETE METABOLIC PANEL WITH GFR ?-     Hemoglobin A1c ?Stressed importance of diet and exercise, push fluids ? ? ?Hyperlipidemia associated with type 2 diabetes mellitus (HCCKimballLife style controlled, discussed statin ?Discussed dietary and exercise modifications ?Low fat diet ?-     Lipid panel ? ?Aortic atherosclerosis (HCCHamliny Abd CT scan ion 09/19/2019 ?Control blood pressure, lipids and glucose ?Disscused lifestyle modifications, diet & exercise ?Continue to monitor ? ?BPH with obstruction/lower urinary tract symptoms ?Doing well at this time ?Continue medications:  ?Will continue to monitor ?Defer PSA to CPE ? ?Hepatic steatosis ?Discussed dietary and exercise modifications ? ?OSA (obstructive sleep apnea) ?Continue  CPAP/BiPAP, using nightly for at least 8 hours  ?Helping with daytime fatigue ?Weight loss still advised ?Discussed mask & tubing hygeine ? ?Obesity (BMI 30.0-34.9) ?Discussed dietary and exercise modifications ? ?Gastroesophageal reflux disease, unspecified whether esophagitis present ?Doing well at this time ?Continue:  ?Diet discussed ?Monitor for triggers ?Avoid food with high acid content ?Avoid excessive cafeine ?Increase water intake ? ?Diarrhea/Nausea ?Continue lower dose of Trulicity [id number] Appt with Dr. ManCollene Mares further evaluate ? ?Vitamin D deficiency ?Continue supplementation to maintain goal of 70-100 ?Taking Vitamin D 2,000 IU daily ?Defer vitamin D level ? ?Pneumococcal vaccine need ?Prevnar 20 give IM ? ?Medication management ?Continued ? ? ?Over 30 minutes of face to face exam, counseling, chart review, and complex, high/moderate level critical decision making was performed this visit.  ? ?Future Appointments  ?Date Time Provider [place]  9:00 AM McKUnk PintoD [facility] None  ?11/15/2022 10:00 AM Marajade Lei, DanTownsend RogerP [facility] None  ? ? ?Plan:  ? ?During the course of the visit the patient was educated and counseled about appropriate screening and preventive services including:  ? ?Pneumococcal vaccine  ?Prevnar 13 ?Influenza vaccine ?Td vaccine ?Screening electrocardiogram ?Bone densitometry screening ?Colorectal cancer screening ?Diabetes screening ?Glaucoma screening ?Nutrition counseling  ?Advanced directives: requested  ? ?HPI ?[age]o. obese male with history of DM2, CKD improved, gout, HLD, HTN, OSA, kidney stone s/p right ureteroscopy, laser lithotripsy and ureteral stent on 03/10 with Dr. WinLovena Neighbourseactive OA with elevated CRP/ESR, following with Dr. AryKathlene Novemberere for 3 month follow up.  ? ?His last OV was 06/2020 for CPE. 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 ?He has been having diarrhea after eating and upset stomach.  Her  is established with Dr. Collene Mares but they are requesting a referral to evaluate. He was initially questioning Trulicity as the cause so went from Trulicity 3 mg to 1.5 but symptoms persist.  ? ?He has been following with Dr Maureen Ralphs for Osteoarthritis of bilateral knee joints.  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  Knee pain has improved with good range of motion. He is now having right knee pain and is going to be having injections. May potentially need right knee replacement.  ? ?He is currently on  Atenolol 100 mg and Amlodipine 89m 1/2 tab daily.  BP's have been running in normal range.  ?BP Readings from Last 3 Encounters:  ?11/14/21 138/72  ?07/26/21 136/78  ?05/16/21 140/74  ?  ?Patient had migratory arthralgias with elevated CRP/ESR, following with Dr. AMegan Salon Currenlty on Leflunomide 10 mg daily- will monitor liver function.    ? ? ?BMI is Body mass index is 34.78 kg/m?., he is working on diet and exercise. Has not been watching his diet as much. Exercise is not as much. ?Wt Readings from Last 3 Encounters:  ?11/14/21 225 lb 6.4 oz (102.2 kg)  ?07/26/21 227 lb 3.2 oz (103.1 kg)  ?05/16/21 229 lb 3.2 oz (104 kg)  ? ? ?The patient is diabetic and has his CDL.  ? ?He has hyperlipidemia, diet controlled ?He has CKD which improved with kidney stone removal and adjustment of medications.  ?He is on Trulicity 10.1UUonce a week, Metformin 5044m1 tab daily can not tolerate more due to loose stools frequent and urgency associated. Tried to increase to 3 mg daily but having a lot of loose stools and stomach upset.  ?He is taking glyburide 5 mg a half pill at night occ if over 180, recently has had to use due to elevated blood sugars from steroid use. ?Patient does have hypoglycemia awareness.  ?He has been running 200-300 fasting after he had recent steroid shot in knee. ? He is on Atenolol 1001mnd amlodipine 10 mg 1/2 tab daily and flomax.  ?Wearing CPAP at night for OSA. ? ?Lab Results  ?Component Value Date  ?  HGBA1C 8.9 (H) 07/26/2021  ? ?Lab Results  ?Component Value Date  ? EGFR 70 07/26/2021  ?  ? ?Patient is on Vitamin D3 supplement for defciency (37, 2008).  Last result was not to goal.  ?Lab Results  ?Component Value Date  ? VD25OH 35 07/26/2021  ? ? ? ? ? ?Current Outpatient Medications (Endocrine & Metabolic):  ?  Dulaglutide (TRULICITY) 3 MG/VO/5.3GUPN, Inject 3 mg as directed once a week. ?  glyBURIDE (DIABETA) 5 MG tablet, Take 1/2 to 1 tablet     3 x /day       with Meals for Diabetes (Patient taking differently: Take 5 mg by mouth daily as needed (blood sugar above 200).) ?  metFORMIN (GLUCOPHAGE XR) 500 MG 24 hr tablet, Take     1 to 2 tablets       2 x /day        with Meals      for Diabetes ? ?Current Outpatient Medications (Cardiovascular):  ?  amLODipine (NORVASC) 10 MG tablet, TAKE 1/2 TO 1 TABLET BY MOUTH DAILY FOR BLOOD PRESSURE ?  atenolol (TENORMIN) 100 MG tablet, TAKE 1 TABLET BY MOUTH DAILY FOR BLOOD PRESSURE ? ? ?Current Outpatient Medications (Analgesics):  ?  acetaminophen (TYLENOL) 500 MG tablet, Take 1,000 mg by mouth every 6 (six) hours as needed for moderate pain. ?  leflunomide (ARAVA) 10 MG tablet, Take 10 mg by mouth daily. ? ? ?Current Outpatient Medications (Other):  ?  famotidine (PEPCID) 40 MG tablet, Take     1 tablet      at Bedtime       for Indigestion & Acid Reflux ?  meclizine (ANTIVERT) 25 MG tablet, Take 25 mg by mouth 3 (three) times daily as needed for dizziness. ?  ondansetron (ZOFRAN) 8 MG tablet, Take 1/2 to 1 tablet 3 x day if needed for Nausea ?  tamsulosin (FLOMAX) 0.4 MG CAPS capsule, TAKE 1 CAPSULE BY MOUTH AT BEDTIME FOR PROSTATE ?  B-D 3CC LUER-LOK SYR 21GX1" 21G X 1" 3 ML MISC, use as directed ?  Blood Glucose Monitoring Suppl (ONETOUCH VERIO) w/Device KIT, Test blood sugar once daily. ?  Blood Glucose Monitoring Suppl DEVI, Use meter three times a day to check blood sugar ?  glucose blood (ONETOUCH VERIO) test strip, Test sugars 3 x a day for  hypoglycemia/hyperglycemia and fluctuating sugars ?  Lancets (ONETOUCH ULTRASOFT) lancets, Test blood sugar once daily ? ?Past Medical History:  ?Diagnosis Date  ?  Covid-19 Positive (02/06/2019)  02/11/2019  ? Arthriti

## 2021-11-14 ENCOUNTER — Encounter: Payer: Self-pay | Admitting: Nurse Practitioner

## 2021-11-14 ENCOUNTER — Ambulatory Visit (INDEPENDENT_AMBULATORY_CARE_PROVIDER_SITE_OTHER): Payer: Managed Care, Other (non HMO) | Admitting: Nurse Practitioner

## 2021-11-14 VITALS — BP 138/72 | HR 74 | Temp 97.5°F | Wt 225.4 lb

## 2021-11-14 DIAGNOSIS — E66811 Obesity, class 1: Secondary | ICD-10-CM

## 2021-11-14 DIAGNOSIS — K760 Fatty (change of) liver, not elsewhere classified: Secondary | ICD-10-CM

## 2021-11-14 DIAGNOSIS — E559 Vitamin D deficiency, unspecified: Secondary | ICD-10-CM

## 2021-11-14 DIAGNOSIS — E782 Mixed hyperlipidemia: Secondary | ICD-10-CM

## 2021-11-14 DIAGNOSIS — Z23 Encounter for immunization: Secondary | ICD-10-CM | POA: Diagnosis not present

## 2021-11-14 DIAGNOSIS — I700 Atherosclerosis of aorta: Secondary | ICD-10-CM | POA: Diagnosis not present

## 2021-11-14 DIAGNOSIS — R110 Nausea: Secondary | ICD-10-CM

## 2021-11-14 DIAGNOSIS — E669 Obesity, unspecified: Secondary | ICD-10-CM

## 2021-11-14 DIAGNOSIS — E1169 Type 2 diabetes mellitus with other specified complication: Secondary | ICD-10-CM

## 2021-11-14 DIAGNOSIS — Z79899 Other long term (current) drug therapy: Secondary | ICD-10-CM

## 2021-11-14 DIAGNOSIS — N401 Enlarged prostate with lower urinary tract symptoms: Secondary | ICD-10-CM

## 2021-11-14 DIAGNOSIS — E1122 Type 2 diabetes mellitus with diabetic chronic kidney disease: Secondary | ICD-10-CM

## 2021-11-14 DIAGNOSIS — I10 Essential (primary) hypertension: Secondary | ICD-10-CM

## 2021-11-14 DIAGNOSIS — K2100 Gastro-esophageal reflux disease with esophagitis, without bleeding: Secondary | ICD-10-CM

## 2021-11-14 DIAGNOSIS — N138 Other obstructive and reflux uropathy: Secondary | ICD-10-CM

## 2021-11-14 DIAGNOSIS — Z0001 Encounter for general adult medical examination with abnormal findings: Secondary | ICD-10-CM

## 2021-11-14 DIAGNOSIS — R197 Diarrhea, unspecified: Secondary | ICD-10-CM

## 2021-11-14 DIAGNOSIS — G4733 Obstructive sleep apnea (adult) (pediatric): Secondary | ICD-10-CM

## 2021-11-15 LAB — HEMOGLOBIN A1C
Hgb A1c MFr Bld: 10.1 % of total Hgb — ABNORMAL HIGH (ref <5.7)
Mean Plasma Glucose: 243 mg/dL
eAG (mmol/L): 13.5 mmol/L

## 2021-11-15 LAB — COMPLETE METABOLIC PANEL WITH GFR
AG Ratio: 1.7 (calc) (ref 1.0–2.5)
ALT: 15 U/L (ref 9–46)
AST: 14 U/L (ref 10–35)
Albumin: 3.9 g/dL (ref 3.6–5.1)
Alkaline phosphatase (APISO): 70 U/L (ref 35–144)
BUN: 21 mg/dL (ref 7–25)
CO2: 29 mmol/L (ref 20–32)
Calcium: 9.7 mg/dL (ref 8.6–10.3)
Chloride: 100 mmol/L (ref 98–110)
Creat: 1.08 mg/dL (ref 0.70–1.28)
Globulin: 2.3 g/dL (calc) (ref 1.9–3.7)
Glucose, Bld: 203 mg/dL — ABNORMAL HIGH (ref 65–99)
Potassium: 5 mmol/L (ref 3.5–5.3)
Sodium: 137 mmol/L (ref 135–146)
Total Bilirubin: 0.6 mg/dL (ref 0.2–1.2)
Total Protein: 6.2 g/dL (ref 6.1–8.1)
eGFR: 72 mL/min/{1.73_m2} (ref >=60)

## 2021-11-15 LAB — LIPID PANEL
Cholesterol: 183 mg/dL (ref <200)
HDL: 47 mg/dL (ref >=40)
LDL Cholesterol (Calc): 108 mg/dL (calc) — ABNORMAL HIGH
Non-HDL Cholesterol (Calc): 136 mg/dL (calc) — ABNORMAL HIGH (ref <130)
Total CHOL/HDL Ratio: 3.9 (calc) (ref <5.0)
Triglycerides: 167 mg/dL — ABNORMAL HIGH (ref <150)

## 2021-11-15 LAB — CBC WITH DIFFERENTIAL/PLATELET
Absolute Monocytes: 606 cells/uL (ref 200–950)
Basophils Absolute: 58 cells/uL (ref 0–200)
Basophils Relative: 0.7 %
Eosinophils Absolute: 133 cells/uL (ref 15–500)
Eosinophils Relative: 1.6 %
HCT: 44.9 % (ref 38.5–50.0)
Hemoglobin: 14.7 g/dL (ref 13.2–17.1)
Lymphs Abs: 2042 cells/uL (ref 850–3900)
MCH: 27 pg (ref 27.0–33.0)
MCHC: 32.7 g/dL (ref 32.0–36.0)
MCV: 82.4 fL (ref 80.0–100.0)
MPV: 9.9 fL (ref 7.5–12.5)
Monocytes Relative: 7.3 %
Neutro Abs: 5461 cells/uL (ref 1500–7800)
Neutrophils Relative %: 65.8 %
Platelets: 185 10*3/uL (ref 140–400)
RBC: 5.45 10*6/uL (ref 4.20–5.80)
RDW: 14.2 % (ref 11.0–15.0)
Total Lymphocyte: 24.6 %
WBC: 8.3 10*3/uL (ref 3.8–10.8)

## 2021-11-25 ENCOUNTER — Other Ambulatory Visit: Payer: Self-pay | Admitting: Internal Medicine

## 2021-11-29 ENCOUNTER — Telehealth: Payer: Self-pay | Admitting: Nurse Practitioner

## 2021-11-29 ENCOUNTER — Other Ambulatory Visit: Payer: Self-pay

## 2021-11-29 DIAGNOSIS — E1122 Type 2 diabetes mellitus with diabetic chronic kidney disease: Secondary | ICD-10-CM

## 2021-11-29 MED ORDER — TRULICITY 3 MG/0.5ML ~~LOC~~ SOAJ: 3.0000 mg | SUBCUTANEOUS | 3 refills | Status: DC

## 2021-11-29 NOTE — Telephone Encounter (Signed)
Pt is saying that he has not taken the 3mg  of Trulicity since last year because it made him sick, that he has been on 1.5 mg consistently since then. The prescription on our end says he has been on 3mg . Please call patient an clarify what the dose of his Trulicity is.  ?

## 2021-11-29 NOTE — Telephone Encounter (Signed)
Requesting a refill on Trulicity to the [facility] on [address]  ?

## 2021-11-30 ENCOUNTER — Other Ambulatory Visit: Payer: Self-pay | Admitting: Nurse Practitioner

## 2021-11-30 DIAGNOSIS — E1122 Type 2 diabetes mellitus with diabetic chronic kidney disease: Secondary | ICD-10-CM

## 2021-11-30 MED ORDER — TRULICITY 1.5 MG/0.5ML ~~LOC~~ SOAJ: 1.5000 mg | SUBCUTANEOUS | 3 refills | Status: DC

## 2021-11-30 NOTE — Telephone Encounter (Signed)
I sent in the 15 mg dose  

## 2021-12-08 ENCOUNTER — Telehealth: Payer: Self-pay | Admitting: Nurse Practitioner

## 2021-12-08 NOTE — Telephone Encounter (Signed)
If they advised him to stop the medication then have him stop them but will need to take Metformin regularly and glyburide as well.

## 2021-12-08 NOTE — Telephone Encounter (Signed)
Pts wife asked [facility] to send over recent OV notes because he has been having recurring diarrhea and they are apparently attributing it to Trulicity and Omeprazole. They told him to stop taking both and now she is wanting to know what he is supposed to be doing in regards to the trulicity. They are also going out of town this weekend.

## 2022-01-05 ENCOUNTER — Other Ambulatory Visit: Payer: Self-pay | Admitting: Nurse Practitioner

## 2022-01-05 ENCOUNTER — Other Ambulatory Visit: Payer: Self-pay | Admitting: Internal Medicine

## 2022-01-05 DIAGNOSIS — K2100 Gastro-esophageal reflux disease with esophagitis, without bleeding: Secondary | ICD-10-CM

## 2022-01-05 DIAGNOSIS — N138 Other obstructive and reflux uropathy: Secondary | ICD-10-CM

## 2022-02-13 ENCOUNTER — Other Ambulatory Visit: Payer: Self-pay | Admitting: Nurse Practitioner

## 2022-02-24 NOTE — Progress Notes (Signed)
FOLLOW UP 3 MONTH   Assessment and Plan:  Essential hypertension Continue Amlodipine 10 mg  and Atenolol 100 mg  . Add HCTZ 25 mg daily Monitor blood pressure DECREASE SODIUM intake Monitor blood pressure at home; call if consistently over 130/80 Continue DASH diet.   Reminder to go to the ER if any CP, SOB, nausea, dizziness, severe HA, changes vision/speech, left arm numbness and tingling and jaw pain. Follow up in 1 month for reevaluation   Type 2 diabetes mellitus with stage 2 chronic kidney disease, without long-term current use of insulin (HCC) Taking Trulicity 9.7CB weekly Metformin 534m one tablet related to GI side effects MUST CHECK BLOOD SUGAR at least BID- can not take Glyburide without checking BS -     Hemoglobin A1c Discussed general issues about diabetes pathophysiology and management., Educational material distributed., Suggested low cholesterol diet., Encouraged aerobic exercise., Discussed foot care., Reminded to get yearly retinal exam. 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  Hyperlipidemia associated with type 2 diabetes mellitus (HCC) Life style controlled, discussed statin Discussed dietary and exercise modifications Low fat diet -     Lipid panel  Diarrhea, unspecified type Has been evaluated by Dr. MCollene Mareswho stopped his olmesartan as suspected this was possible cause- no change in diarrhea Advised to call GI for appointment to further evaluate as very loose stools with occ. BM incontinence persists.  Edema of lower  extremities bilaterally Advised to increase water intake and limit sodium Counseled on importance of using compression socks- pt declines and states he will not use Begin HCTZ 25 mg daily Follow up in 1 month- if no change in lower legs will order ABI  Aortic atherosclerosis (HCC) by Abd CT scan ion 09/19/2019 Control blood pressure, lipids and glucose Disscused lifestyle modifications, diet & exercise Continue to monitor  BPH with obstruction/lower urinary tract symptoms Doing well at this time Continue medications:  Will continue to monitor Defer PSA this check  Hepatic steatosis Discussed dietary and exercise modifications  OSA (obstructive sleep apnea) Continue CPAP/BiPAP, using nightly for at least 8 hours  Helping with daytime fatigue Weight loss still advised Discussed mask & tubing hygeine  Obesity (BMI 30.0-34.9) Discussed dietary and exercise modifications  Gastroesophageal reflux disease, unspecified whether esophagitis present Doing well at this time Continue:  Diet discussed Monitor for triggers Avoid food with high acid content Avoid excessive cafeine Increase water intake  Vitamin D deficiency Continue supplementation to maintain goal of 70-100 Taking Vitamin D 2,000 IU daily Defer vitamin D level  Medication management Continued   Over 30 minutes of face to face exam, counseling, chart review, and complex, high/moderate level critical decision making was performed this visit.   Future Appointments  Date Time Provider [place] 07/31/2022  9:00 AM MUnk Pinto MD [facility] None  11/15/2022 10:00 AM WAlycia Rossetti NP [facility] None     HPI 76y.o. obese male with history of DM2, CKD improved, gout, HLD,  HTN, OSA, kidney stone s/p right ureteroscopy, laser lithotripsy and ureteral stent on 03/10 with Dr. [name], reactive OA with elevated CRP/ESR, following with Dr. Kathlene November, here for 3 month follow up.    He is a Administrator with  CDL.   Had knee replacement of left knee   Redness and swelling of lower legs bilaterally for approximately 1 month.  He was on vacation last week and ate more sodium, drank more sodas and drank very little water.  He does have some stiffness of lower legs from the swelling. He states he cant get compression socks on so is not using.   He is currently on Amlodipine 10 mg  and Atenolol 100 mg daily BP Readings from Last 3 Encounters:  02/27/22 (!) 142/70  11/14/21 138/72  07/26/21 136/78   He is currently having worsening diarrhea .  Will have incontinence episodes of stool even with 4-5 immodium a day.  He was evaluated by Dr. Collene Mares.   Patient had migratory arthralgias with elevated CRP/ESR, following with Dr. Megan Salon. Currenlty on Leflunomide 10 mg daily- will monitor liver function.     BMI is Body mass index is 34.66 kg/m., he is working on diet and exercise. Wt Readings from Last 3 Encounters:  02/27/22 224 lb 9.6 oz (101.9 kg)  11/14/21 225 lb 6.4 oz (102.2 kg)  07/26/21 227 lb 3.2 oz (103.1 kg)    The patient is diabetic and has his CDL.  He has hyperlipidemia, diet controlled He has CKD which improved with kidney stone removal and adjustment of medications.  He is not checking his blood sugars He is on Trulicity 8.3JA once a week Patient does have hypoglycemia awareness.  He is checking his sugar maybe once a day  and his wife has been working very hard on with him.  She expresses frustration today.  He is on Amlodipine 10 mg and atenolol 13m and flomax. Was taken off Olmesartan because of diarrhea. Has had no change in diarrhea since stopping olmesartan Wearing CPAP at night for OSA.  Lab Results  Component Value Date   HGBA1C 10.1 (H) 11/14/2021   Lab Results  Component Value Date   EGFR 72 11/14/2021     Patient is on Vitamin D3 supplement for defciency (37, 2008).  Last result was not to goal.  Lab Results  Component Value Date   VD25OH 35 07/26/2021        Current Outpatient Medications (Endocrine & Metabolic):    Dulaglutide (TRULICITY) 1.5 MSN/0.5LZSOPN, Inject 1.5 mg into the skin once a week.   glyBURIDE (DIABETA) 5 MG tablet, Take 1/2 to 1 tablet     3 x /day       with Meals for Diabetes (Patient taking differently: Take 5 mg by mouth daily as needed (blood sugar above 200).)   metFORMIN (GLUCOPHAGE XR) 500 MG 24 hr tablet, Take     1 to 2 tablets       2 x /day        with Meals      for Diabetes  Current Outpatient Medications (Cardiovascular):    amLODipine (NORVASC) 10 MG tablet, TAKE 1/2 TO 1 TABLET BY MOUTH DAILY FOR BLOOD PRESSURE   atenolol (TENORMIN) 100 MG tablet, TAKE 1 TABLET BY MOUTH DAILY FOR BLOOD PRESSURE   Current Outpatient Medications (Analgesics):    acetaminophen (TYLENOL) 500 MG tablet, Take 1,000 mg by mouth every 6 (six) hours as needed for moderate pain.   leflunomide (ARAVA)  10 MG tablet, Take 10 mg by mouth daily.   Current Outpatient Medications (Other):    famotidine (PEPCID) 40 MG tablet, TAKE 1 TABLET BY MOUTH AT BEDTIME FOR INDIGESTION OR ACID REFLUX   Loperamide HCl (IMODIUM PO), Take by mouth.   meclizine (ANTIVERT) 25 MG tablet, Take 25 mg by mouth 3 (three) times daily as needed for dizziness.   ondansetron (ZOFRAN) 8 MG tablet, Take 1/2 to 1 tablet 3 x day if needed for Nausea   tamsulosin (FLOMAX) 0.4 MG CAPS capsule, TAKE 1 CAPSULE BY MOUTH AT BEDTIME FOR PROSTATE   B-D 3CC LUER-LOK SYR 21GX1" 21G X 1" 3 ML MISC, use as directed   Blood Glucose Monitoring Suppl (ONETOUCH VERIO) w/Device KIT, Test blood sugar once daily.   Blood Glucose Monitoring Suppl DEVI, Use meter three times a day to check blood sugar   glucose blood (ONETOUCH VERIO) test strip, Test sugars 3 x a day for hypoglycemia/hyperglycemia and fluctuating sugars   Lancets (ONETOUCH ULTRASOFT) lancets, Test blood sugar once daily  Past Medical History:  Diagnosis Date    Covid-19 Positive (02/06/2019)  02/11/2019    Arthritis    BPH (benign prostatic hypertrophy)    Diabetes mellitus without complication (HCC)    type w    GERD (gastroesophageal reflux disease)    Hepatic steatosis    History of kidney stones    Hydroureteronephrosis    Hypertension    Hypogonadism male    Mass    left shoulder AC joint   OSA (obstructive sleep apnea)    wears CPAP wears 4 hours per nite    PONV (postoperative nausea and vomiting)    Renal calculi    Sarcoidosis    Vitamin D deficiency      Allergies  Allergen Reactions   Ace Inhibitors Other (See Comments)    Unknown   Ibuprofen Other (See Comments)    GI upset   Invokana [Canagliflozin] Other (See Comments)    Unknown   Quinidine Other (See Comments)    Unknown    ROS: all negative except above.   Physical Exam: Filed Weights   02/27/22 1016  Weight: 224 lb 9.6 oz (101.9 kg)    BP (!) 142/70   Pulse 73   Temp 97.7 F (36.5 C)   Ht 5' 7.5" (1.715 m)   Wt 224 lb 9.6 oz (101.9 kg)   SpO2 96%   BMI 34.66 kg/m    General Appearance: Well nourished, obese male in no apparent distress, appears uncomfortable. Eyes: PERRLA, conjunctiva no swelling or erythema ENT/Mouth: Ext aud canals clear, TMs without erythema, bulging. Mask in place; oral exam. Hearing normal.   Neck: Supple, thyroid normal.  Respiratory: Respiratory effort normal, BS equal bilaterally, decreased breath sounds without rales, rhonchi, wheezing or stridor.  Cardio: RRR with no MRGs. Brisk peripheral pulses with 1+ edema bilateral legs with stasis dermatitis.  Abdomen: Soft, obese + BS.  Non tender, no guarding, rebound, hernias, masses. Lymphatics: Non tender without lymphadenopathy.  Musculoskeletal:  Gait is antalgic  Strength is normal and symmetric in arms and legs.  Skin: Warm, dry . Stasis dermatitis of lower legs with mild redness, Several scabbed areas on shins.  Neuro: Normal muscle tone, Sensation intact.  Psych: Awake and oriented X 3, normal affect, Insight and  Judgment appropriate.      [facility]  02/27/2022

## 2022-02-27 ENCOUNTER — Ambulatory Visit: Payer: Managed Care, Other (non HMO) | Admitting: Nurse Practitioner

## 2022-02-27 ENCOUNTER — Encounter: Payer: Self-pay | Admitting: Nurse Practitioner

## 2022-02-27 VITALS — BP 142/70 | HR 73 | Temp 97.7°F | Ht 67.5 in | Wt 224.6 lb

## 2022-02-27 DIAGNOSIS — N401 Enlarged prostate with lower urinary tract symptoms: Secondary | ICD-10-CM

## 2022-02-27 DIAGNOSIS — E1169 Type 2 diabetes mellitus with other specified complication: Secondary | ICD-10-CM | POA: Diagnosis not present

## 2022-02-27 DIAGNOSIS — R600 Localized edema: Secondary | ICD-10-CM

## 2022-02-27 DIAGNOSIS — N182 Chronic kidney disease, stage 2 (mild): Secondary | ICD-10-CM

## 2022-02-27 DIAGNOSIS — I10 Essential (primary) hypertension: Secondary | ICD-10-CM | POA: Diagnosis not present

## 2022-02-27 DIAGNOSIS — K2100 Gastro-esophageal reflux disease with esophagitis, without bleeding: Secondary | ICD-10-CM

## 2022-02-27 DIAGNOSIS — E785 Hyperlipidemia, unspecified: Secondary | ICD-10-CM | POA: Diagnosis not present

## 2022-02-27 DIAGNOSIS — N138 Other obstructive and reflux uropathy: Secondary | ICD-10-CM

## 2022-02-27 DIAGNOSIS — G4733 Obstructive sleep apnea (adult) (pediatric): Secondary | ICD-10-CM

## 2022-02-27 DIAGNOSIS — E669 Obesity, unspecified: Secondary | ICD-10-CM

## 2022-02-27 DIAGNOSIS — E1122 Type 2 diabetes mellitus with diabetic chronic kidney disease: Secondary | ICD-10-CM

## 2022-02-27 DIAGNOSIS — E559 Vitamin D deficiency, unspecified: Secondary | ICD-10-CM

## 2022-02-27 DIAGNOSIS — Z79899 Other long term (current) drug therapy: Secondary | ICD-10-CM

## 2022-02-27 DIAGNOSIS — I700 Atherosclerosis of aorta: Secondary | ICD-10-CM | POA: Diagnosis not present

## 2022-02-27 DIAGNOSIS — R197 Diarrhea, unspecified: Secondary | ICD-10-CM

## 2022-02-27 DIAGNOSIS — K760 Fatty (change of) liver, not elsewhere classified: Secondary | ICD-10-CM

## 2022-02-27 MED ORDER — BLOOD GLUCOSE MONITOR KIT: PACK | 0 refills | Status: DC

## 2022-02-27 MED ORDER — HYDROCHLOROTHIAZIDE 25 MG PO TABS: 25.0000 mg | ORAL_TABLET | Freq: Every day | ORAL | 3 refills | Status: DC

## 2022-02-28 ENCOUNTER — Telehealth: Payer: Self-pay | Admitting: Nurse Practitioner

## 2022-02-28 ENCOUNTER — Other Ambulatory Visit: Payer: Self-pay | Admitting: Nurse Practitioner

## 2022-02-28 DIAGNOSIS — E1122 Type 2 diabetes mellitus with diabetic chronic kidney disease: Secondary | ICD-10-CM

## 2022-02-28 LAB — CBC WITH DIFFERENTIAL/PLATELET
Absolute Monocytes: 450 cells/uL (ref 200–950)
Basophils Absolute: 68 cells/uL (ref 0–200)
Basophils Relative: 0.9 %
Eosinophils Absolute: 188 cells/uL (ref 15–500)
Eosinophils Relative: 2.5 %
HCT: 43.6 % (ref 38.5–50.0)
Hemoglobin: 14.6 g/dL (ref 13.2–17.1)
Lymphs Abs: 2025 cells/uL (ref 850–3900)
MCH: 27.3 pg (ref 27.0–33.0)
MCHC: 33.5 g/dL (ref 32.0–36.0)
MCV: 81.6 fL (ref 80.0–100.0)
MPV: 9.9 fL (ref 7.5–12.5)
Monocytes Relative: 6 %
Neutro Abs: 4770 cells/uL (ref 1500–7800)
Neutrophils Relative %: 63.6 %
Platelets: 204 10*3/uL (ref 140–400)
RBC: 5.34 10*6/uL (ref 4.20–5.80)
RDW: 13.9 % (ref 11.0–15.0)
Total Lymphocyte: 27 %
WBC: 7.5 10*3/uL (ref 3.8–10.8)

## 2022-02-28 LAB — COMPLETE METABOLIC PANEL WITH GFR
AG Ratio: 1.6 (calc) (ref 1.0–2.5)
ALT: 17 U/L (ref 9–46)
AST: 15 U/L (ref 10–35)
Albumin: 4 g/dL (ref 3.6–5.1)
Alkaline phosphatase (APISO): 76 U/L (ref 35–144)
BUN: 13 mg/dL (ref 7–25)
CO2: 31 mmol/L (ref 20–32)
Calcium: 9.3 mg/dL (ref 8.6–10.3)
Chloride: 99 mmol/L (ref 98–110)
Creat: 0.98 mg/dL (ref 0.70–1.28)
Globulin: 2.5 g/dL (calc) (ref 1.9–3.7)
Glucose, Bld: 236 mg/dL — ABNORMAL HIGH (ref 65–99)
Potassium: 4.6 mmol/L (ref 3.5–5.3)
Sodium: 139 mmol/L (ref 135–146)
Total Bilirubin: 0.3 mg/dL (ref 0.2–1.2)
Total Protein: 6.5 g/dL (ref 6.1–8.1)
eGFR: 81 mL/min/{1.73_m2} (ref >=60)

## 2022-02-28 LAB — MICROALBUMIN / CREATININE URINE RATIO
Creatinine, Urine: 86 mg/dL (ref 20–320)
Microalb Creat Ratio: 187 mcg/mg creat — ABNORMAL HIGH (ref <30)
Microalb, Ur: 16.1 mg/dL

## 2022-02-28 LAB — LIPID PANEL
Cholesterol: 179 mg/dL (ref <200)
HDL: 42 mg/dL (ref >=40)
LDL Cholesterol (Calc): 96 mg/dL (calc)
Non-HDL Cholesterol (Calc): 137 mg/dL (calc) — ABNORMAL HIGH (ref <130)
Total CHOL/HDL Ratio: 4.3 (calc) (ref <5.0)
Triglycerides: 308 mg/dL — ABNORMAL HIGH (ref <150)

## 2022-02-28 LAB — HEMOGLOBIN A1C
Hgb A1c MFr Bld: 9.4 % of total Hgb — ABNORMAL HIGH (ref <5.7)
Mean Plasma Glucose: 223 mg/dL
eAG (mmol/L): 12.4 mmol/L

## 2022-02-28 MED ORDER — BLOOD GLUCOSE MONITOR KIT: PACK | 0 refills | Status: AC

## 2022-02-28 NOTE — Telephone Encounter (Signed)
I have sent in clarified directions on glucometer, let us know if he is not able to get

## 2022-02-28 NOTE — Telephone Encounter (Signed)
Patients wife is stating that the pharmacy is unable to fill his glucose meter and strips because they need more information. Signature on script needs a specific amount of strips and times to test daily.

## 2022-03-14 ENCOUNTER — Ambulatory Visit: Payer: Managed Care, Other (non HMO) | Admitting: Nurse Practitioner

## 2022-03-14 ENCOUNTER — Encounter: Payer: Self-pay | Admitting: Nurse Practitioner

## 2022-03-14 ENCOUNTER — Encounter: Payer: Self-pay | Admitting: Internal Medicine

## 2022-03-14 VITALS — BP 154/80 | HR 83 | Temp 97.5°F | Ht 67.5 in | Wt 221.0 lb

## 2022-03-14 DIAGNOSIS — R058 Other specified cough: Secondary | ICD-10-CM | POA: Diagnosis not present

## 2022-03-14 DIAGNOSIS — I10 Essential (primary) hypertension: Secondary | ICD-10-CM

## 2022-03-14 DIAGNOSIS — R0981 Nasal congestion: Secondary | ICD-10-CM

## 2022-03-14 DIAGNOSIS — R5383 Other fatigue: Secondary | ICD-10-CM | POA: Diagnosis not present

## 2022-03-14 MED ORDER — AZITHROMYCIN 250 MG PO TABS: ORAL_TABLET | ORAL | 1 refills | Status: DC

## 2022-03-14 MED ORDER — BENZONATATE 100 MG PO CAPS: ORAL_CAPSULE | ORAL | 0 refills | Status: DC

## 2022-03-14 NOTE — Progress Notes (Signed)
Assessment and Plan:  Phillip Powell was seen today for an episodic visit.  Diagnoses and all order for this visit:  1. Productive cough Stay well hydrated to keep mucus thin and productive. Continue Coricidin PRN as directed. Rest.   - azithromycin (ZITHROMAX) 250 MG tablet; Take 2 tablets (500 mg) on Day 1 followed by 1 tablet  (250 mg) daily until complete.  Dispense: 6 each; Refill: 1 - benzonatate (TESSALON PERLES) 100 MG capsule; Take 1 capsule (100 mg) TID PRN for cough.  Dispense: 30 capsule; Refill: 0  2. Nasal congestion  - azithromycin (ZITHROMAX) 250 MG tablet; Take 2 tablets (500 mg) on Day 1 followed by 1 tablet  (250 mg) daily until complete.  Dispense: 6 each; Refill: 1 - benzonatate (TESSALON PERLES) 100 MG capsule; Take 1 capsule (100 mg) TID PRN for cough.  Dispense: 30 capsule; Refill: 0  3. Other fatigue Rest  4. Essential hypertension Above goal.  Continue medications as directed.  Discussed DASH (Dietary Approaches to Stop Hypertension) DASH diet is lower in sodium than a typical American diet. Cut back on foods that are high in saturated fat, cholesterol, and trans fats. Eat more whole-grain foods, fish, poultry, and nuts Remain active and exercise as tolerated daily.  Monitor BP at home-Call if greater than 130/80.  Check CMP/CBC  If not improvement in 2 days, may need to reschedule colonoscopy.  Continue to monitor for any increase in fever, chills, N/V, diarrhea, difficulty breathing.  Notify office for further evaluation and treatment, questions or concerns if s/s fail to improve. The risks and benefits of my recommendations, as well as other treatment options were discussed with the patient today. Questions were answered.  Further disposition pending results of labs. Discussed med's effects and SE's.    Over 15 minutes of exam, counseling, chart review, and critical decision making was performed.   Future Appointments  Date Time Provider  [place]  04/03/2022 11:00 AM Alycia Rossetti, NP [facility] None  07/31/2022  9:00 AM Unk Pinto, MD [facility] None  11/15/2022 10:00 AM Alycia Rossetti, NP [facility] None    ------------------------------------------------------------------------------------------------------------------   HPI BP (!) 154/80   Pulse 83   Temp (!) 97.5 F (36.4 C)   Ht 5' 7.5" (1.715 m)   Wt 221 lb (100.2 kg)   SpO2 96%   BMI 34.10 kg/m   Patient complains of symptoms of a URI. Symptoms include cough described as productive. Associated nasal congestion and fatigue.  Onset of symptoms was 5 days ago, and has been unchanged since that time. Treatment to date: cough suppressants, Coricidin.  He is concerned for colonoscopy that he has scheduled for Monday, approximately 1 week. Denies fever, chills, wheezing, N/V. restless sleep.     Past Medical History:  Diagnosis Date    Covid-19 Positive (02/06/2019)  02/11/2019   Arthritis    BPH (benign prostatic hypertrophy)    Diabetes mellitus without complication (HCC)    type w    GERD (gastroesophageal reflux disease)    Hepatic steatosis    History of kidney stones    Hydroureteronephrosis    Hypertension    Hypogonadism male    Mass    left shoulder AC joint   OSA (obstructive sleep apnea)    wears CPAP wears 4 hours per nite    PONV (postoperative nausea and vomiting)    Renal calculi    Sarcoidosis    Vitamin D deficiency      Allergies  Allergen Reactions   Ace Inhibitors Other (See Comments)    Unknown   Ibuprofen Other (See Comments)    GI upset   Invokana [Canagliflozin] Other (See Comments)    Unknown   Quinidine Other (See Comments)    Unknown    Current Outpatient Medications on File Prior to Visit  Medication Sig   acetaminophen (TYLENOL) 500 MG tablet Take 1,000 mg by mouth every 6 (six) hours as needed for moderate pain.   amLODipine (NORVASC) 10 MG tablet TAKE 1/2 TO 1 TABLET BY MOUTH DAILY FOR BLOOD  PRESSURE   atenolol (TENORMIN) 100 MG tablet TAKE 1 TABLET BY MOUTH DAILY FOR BLOOD PRESSURE   B-D 3CC LUER-LOK SYR 21GX1" 21G X 1" 3 ML MISC use as directed   blood glucose meter kit and supplies KIT Dispense based on  insurance coverage. Test blood sugar twice daily. #100 lancet and strips with 3 refills   Blood Glucose Monitoring Suppl (ONETOUCH VERIO) w/Device KIT Test blood sugar once daily.   Blood Glucose Monitoring Suppl DEVI Use meter three times a day to check blood sugar   Dulaglutide (TRULICITY) 1.5 MB/5.5HR SOPN Inject 1.5 mg into the skin once a week.   famotidine (PEPCID) 40 MG tablet TAKE 1 TABLET BY MOUTH AT BEDTIME FOR INDIGESTION OR ACID REFLUX   glucose blood (ONETOUCH VERIO) test strip Test sugars 3 x a day for hypoglycemia/hyperglycemia and fluctuating sugars   glyBURIDE (DIABETA) 5 MG tablet Take 1/2 to 1 tablet     3 x /day       with Meals for Diabetes (Patient taking differently: Take 5 mg by mouth daily as needed (blood sugar above 200).)   hydrochlorothiazide (HYDRODIURIL) 25 MG tablet Take 1 tablet (25 mg total) by mouth daily.   Lancets (ONETOUCH ULTRASOFT) lancets Test blood sugar once daily   leflunomide (ARAVA) 10 MG tablet Take 10 mg by mouth daily.   Loperamide HCl (IMODIUM PO) Take by mouth.   meclizine (ANTIVERT) 25 MG tablet Take 25 mg by mouth 3 (three) times daily as needed for dizziness.   metFORMIN (GLUCOPHAGE XR) 500 MG 24 hr tablet Take     1 to 2 tablets       2 x /day        with Meals      for Diabetes   ondansetron (ZOFRAN) 8 MG tablet Take 1/2 to 1 tablet 3 x day if needed for Nausea   tamsulosin (FLOMAX) 0.4 MG CAPS capsule TAKE 1 CAPSULE BY MOUTH AT BEDTIME FOR PROSTATE   No current facility-administered medications on file prior to visit.    ROS: all negative except what is noted in the HPI.   Physical Exam:  BP (!) 154/80   Pulse 83   Temp (!) 97.5 F (36.4 C)   Ht 5' 7.5" (1.715 m)   Wt 221 lb (100.2 kg)   SpO2 96%   BMI 34.10  kg/m   General Appearance: NAD.  Awake, conversant and cooperative. Eyes: PERRLA, EOMs intact.  Sclera white.  Conjunctiva without erythema. Sinuses: No frontal/maxillary tenderness.  No nasal discharge. Nares patent.  ENT/Mouth: Ext aud canals clear.  Bilateral TMs w/DOL and without erythema or bulging. Hearing intact.  Posterior pharynx without swelling or exudate.  Tonsils without swelling or erythema.  Neck: Supple.  No masses, nodules or thyromegaly. Respiratory: Effort is regular with non-labored breathing. Breath sounds are equal bilaterally without rales, rhonchi, wheezing or stridor.  Cardio: RRR with no MRGs. Brisk  peripheral pulses without edema.  Abdomen: Active BS in all four quadrants.  Soft and non-tender without guarding, rebound tenderness, hernias or masses. Lymphatics: Non tender without lymphadenopathy.  Musculoskeletal: Full ROM, 5/5 strength, normal ambulation.  No clubbing or cyanosis. Skin: Appropriate color for ethnicity. Warm without rashes, lesions, ecchymosis, ulcers.  Neuro: CN II-XII grossly normal. Normal muscle tone without cerebellar symptoms and intact sensation.   Psych: AO X 3,  appropriate mood and affect, insight and judgment.     Darrol Jump, NP 10:12 AM [facility]

## 2022-03-20 LAB — HM COLONOSCOPY

## 2022-03-21 ENCOUNTER — Encounter: Payer: Self-pay | Admitting: Internal Medicine

## 2022-03-31 NOTE — Progress Notes (Deleted)
Assessment and Plan:  There are no diagnoses linked to this encounter.    Further disposition pending results of labs. Discussed med's effects and SE's.   Over 30 minutes of exam, counseling, chart review, and critical decision making was performed.   Future Appointments  Date Time Provider [place]  04/03/2022 11:00 AM Alycia Rossetti, NP [facility] None  07/31/2022  9:00 AM Unk Pinto, MD [facility] None  11/15/2022 10:00 AM Alycia Rossetti, NP [facility] None    ------------------------------------------------------------------------------------------------------------------   HPI There were no vitals taken for this visit. 76 y.o.male presents for  Past Medical History:  Diagnosis Date    Covid-19 Positive (02/06/2019)  02/11/2019   Arthritis    BPH (benign prostatic hypertrophy)    Diabetes mellitus without complication (HCC)    type w    GERD (gastroesophageal reflux disease)    Hepatic steatosis    History of kidney stones    Hydroureteronephrosis    Hypertension    Hypogonadism male    Mass    left shoulder AC joint   OSA (obstructive sleep apnea)    wears CPAP wears 4 hours per nite    PONV (postoperative nausea and vomiting)    Renal calculi    Sarcoidosis    Vitamin D deficiency      Allergies  Allergen Reactions   Ace Inhibitors Other (See Comments)    Unknown   Ibuprofen Other (See Comments)    GI upset   Invokana [Canagliflozin] Other (See Comments)    Unknown   Quinidine Other (See Comments)    Unknown    Current Outpatient Medications on File Prior to Visit  Medication Sig   acetaminophen (TYLENOL) 500 MG tablet Take 1,000 mg by mouth every 6 (six) hours as needed for moderate pain.   amLODipine (NORVASC) 10 MG tablet TAKE 1/2 TO 1 TABLET BY MOUTH DAILY FOR BLOOD PRESSURE   atenolol (TENORMIN) 100 MG tablet TAKE 1 TABLET BY MOUTH DAILY FOR BLOOD PRESSURE   azithromycin (ZITHROMAX) 250 MG tablet Take 2 tablets (500 mg) on Day  1 followed by 1 tablet  (250 mg) daily until complete.   B-D 3CC LUER-LOK SYR 21GX1" 21G X 1" 3 ML MISC use as directed   benzonatate (TESSALON PERLES) 100 MG capsule Take 1 capsule (100 mg) TID PRN for cough.   blood glucose meter kit and supplies KIT Dispense based on  insurance coverage. Test blood sugar twice daily. #100 lancet and strips with 3 refills   Blood Glucose Monitoring Suppl (ONETOUCH VERIO) w/Device KIT Test blood sugar once daily.   Blood Glucose Monitoring Suppl DEVI Use meter three times a day to check blood sugar   Dulaglutide (TRULICITY) 1.5 CZ/6.6AY SOPN Inject 1.5 mg into the skin once a week.   famotidine (PEPCID) 40 MG tablet TAKE 1 TABLET BY MOUTH AT BEDTIME FOR INDIGESTION OR ACID REFLUX   glucose blood (ONETOUCH VERIO) test strip Test sugars 3 x a day for hypoglycemia/hyperglycemia and fluctuating sugars   glyBURIDE (DIABETA) 5 MG tablet Take 1/2 to 1 tablet     3 x /day       with Meals for Diabetes (Patient taking differently: Take 5 mg by mouth daily as needed (blood sugar above 200).)   hydrochlorothiazide (HYDRODIURIL) 25 MG tablet Take 1 tablet (25 mg total) by mouth daily.   Lancets (ONETOUCH ULTRASOFT) lancets Test blood sugar once daily   leflunomide (ARAVA) 10 MG tablet Take 10 mg by mouth daily.  Loperamide HCl (IMODIUM PO) Take by mouth.   meclizine (ANTIVERT) 25 MG tablet Take 25 mg by mouth 3 (three) times daily as needed for dizziness.   metFORMIN (GLUCOPHAGE XR) 500 MG 24 hr tablet Take     1 to 2 tablets       2 x /day        with Meals      for Diabetes   ondansetron (ZOFRAN) 8 MG tablet Take 1/2 to 1 tablet 3 x day if needed for Nausea   tamsulosin (FLOMAX) 0.4 MG CAPS capsule TAKE 1 CAPSULE BY MOUTH AT BEDTIME FOR PROSTATE   No current facility-administered medications on file prior to visit.    ROS: all negative except above.   Physical Exam:  There were no vitals taken for this visit.  General Appearance: Well nourished, in no apparent  distress. Eyes: PERRLA, EOMs, conjunctiva no swelling or erythema Sinuses: No Frontal/maxillary tenderness ENT/Mouth: Ext aud canals clear, TMs without erythema, bulging. No erythema, swelling, or exudate on post pharynx.  Tonsils not swollen or erythematous. Hearing normal.  Neck: Supple, thyroid normal.  Respiratory: Respiratory effort normal, BS equal bilaterally without rales, rhonchi, wheezing or stridor.  Cardio: RRR with no MRGs. Brisk peripheral pulses without edema.  Abdomen: Soft, + BS.  Non tender, no guarding, rebound, hernias, masses. Lymphatics: Non tender without lymphadenopathy.  Musculoskeletal: Full ROM, 5/5 strength, normal gait.  Skin: Warm, dry without rashes, lesions, ecchymosis.  Neuro: Cranial nerves intact. Normal muscle tone, no cerebellar symptoms. Sensation intact.  Psych: Awake and oriented X 3, normal affect, Insight and Judgment appropriate.     Alycia Rossetti, NP 10:00 AM [facility]

## 2022-04-03 ENCOUNTER — Encounter: Payer: Self-pay | Admitting: Nurse Practitioner

## 2022-04-03 ENCOUNTER — Ambulatory Visit: Payer: Managed Care, Other (non HMO) | Admitting: Nurse Practitioner

## 2022-04-03 VITALS — BP 140/70 | HR 77 | Temp 97.7°F | Ht 67.5 in | Wt 218.0 lb

## 2022-04-03 DIAGNOSIS — R197 Diarrhea, unspecified: Secondary | ICD-10-CM | POA: Diagnosis not present

## 2022-04-03 DIAGNOSIS — S81812A Laceration without foreign body, left lower leg, initial encounter: Secondary | ICD-10-CM

## 2022-04-03 DIAGNOSIS — E1122 Type 2 diabetes mellitus with diabetic chronic kidney disease: Secondary | ICD-10-CM | POA: Diagnosis not present

## 2022-04-03 DIAGNOSIS — N182 Chronic kidney disease, stage 2 (mild): Secondary | ICD-10-CM

## 2022-04-03 DIAGNOSIS — I10 Essential (primary) hypertension: Secondary | ICD-10-CM | POA: Diagnosis not present

## 2022-04-03 MED ORDER — TRULICITY 1.5 MG/0.5ML ~~LOC~~ SOAJ: 1.5000 mg | SUBCUTANEOUS | 3 refills | Status: DC

## 2022-04-03 MED ORDER — TRULICITY 0.75 MG/0.5ML ~~LOC~~ SOAJ: 0.7500 mg | SUBCUTANEOUS | 3 refills | Status: DC

## 2022-04-03 NOTE — Progress Notes (Signed)
Assessment and Plan: Emit was seen today for follow-up.  Diagnoses and all orders for this visit:  Type 2 diabetes mellitus with stage 2 chronic kidney disease, without long-term current use of insulin (HCC) Over 30 minutes is spent on diabetes counseling- long review of nutrition Continue medications: Metformin 500 mg 1 in AM Increase glyburide to 5 mg 1/2 tab at breakfast and dinner If blood sugars are running greater than 200 3 days in a row he is to restart Trulicity at th 2.99ME dose.  He is currently planning to d/c as he was told by GI his diarrhea is related to medications Continue diet and exercise.  Perform daily foot/skin check, notify office of any concerning changes.  I have personally reviewed his A1c's, fasting blood sugars, food log as well as reviewed history from patient and wife Follow up in 2 months to reevaluate  CKD stage 2 due to type 2 diabetes mellitus (HCC) Increase fluids, avoid NSAIDS, monitor sugars, will monitor   Essential hypertension - continue medications, DASH diet, exercise and monitor at home. Call if greater than 130/80.   Go to the ER if any chest pain, shortness of breath, nausea, dizziness, severe HA, changes vision/speech   Diarrhea, unspecified type  He is currently planning to d/c as he was told by GI his diarrhea is related to medications If diarrhea does not improve with d/c of Trulicity he is to follow up with GI   Laceration of skin of left lower leg Appears to be healing well. Apply triple antibiotic ointment and monitor No s/s of infection If redness starts to spread around the scabbed area or develops drainage or fever notify the office    Further disposition pending results of labs. Discussed med's effects and SE's.   Over 30 minutes of exam, counseling, chart review, and critical decision making was performed.   Future Appointments  Date Time Provider [place]  04/03/2022  9:30 AM Alycia Rossetti, NP [facility]  None  07/31/2022  9:00 AM Unk Pinto, MD [facility] None  11/15/2022 10:00 AM Alycia Rossetti, NP [facility] None    ------------------------------------------------------------------------------------------------------------------   HPI BP (!) 140/70   Pulse 77   Temp 97.7 F (36.5 C)   Ht 5' 7.5" (1.715 m)   Wt 218 lb (98.9 kg)   SpO2 96%   BMI 33.64 kg/m   76 y.o.male presents for hypertension.  He is currently on Amlodipine 10 mg, Atenolol 161m and Hctz 25 mg qd.  Denies headaches, chest pain, shortness of breath and dizziness.  BP Readings from Last 3 Encounters:  04/03/22 (!) 140/70  03/14/22 (!) 154/80  02/27/22 (!) 142/70   He did just have colonoscopy 03/20/30 and had 12 precancerous polyps, 1 would not stop bleeding and had a clip placed  The clip should be passed through the colon in the next 2 months. Diarrhea has improved slightly.  GI does believe his diarrhea is related to Metfromin and Trulicity.   BMI is Body mass index is 33.64 kg/m., he has been working on diet and exercise. Wt Readings from Last 3 Encounters:  04/03/22 218 lb (98.9 kg)  03/14/22 221 lb (100.2 kg)  02/27/22 224 lb 9.6 oz (101.9 kg)     His blood sugars 180's range 2 hours after eating.   His last A1c was 9.4. He is currently doing 1 metformin in the am and Trulicity. Pt would like to stop Tulicity and continue Metfromin 1 tab .  He has been taking  glyburide 5 mg 1/2 tab in the AM 6:30-8 AM .  He does eat some fruit.  He does not like vegetables. He is a meat and potatoes man.  Lab Results  Component Value Date   HGBA1C 9.4 (H) 02/27/2022     Lab Results  Component Value Date   EGFR 81 02/27/2022     Past Medical History:  Diagnosis Date    Covid-19 Positive (02/06/2019)  02/11/2019   Arthritis    BPH (benign prostatic hypertrophy)    Diabetes mellitus without complication (HCC)    type w    GERD (gastroesophageal reflux disease)    Hepatic steatosis    History of  kidney stones    Hydroureteronephrosis    Hypertension    Hypogonadism male    Mass    left shoulder AC joint   OSA (obstructive sleep apnea)    wears CPAP wears 4 hours per nite    PONV (postoperative nausea and vomiting)    Renal calculi    Sarcoidosis    Vitamin D deficiency      Allergies  Allergen Reactions   Ace Inhibitors Other (See Comments)    Unknown   Ibuprofen Other (See Comments)    GI upset   Invokana [Canagliflozin] Other (See Comments)    Unknown   Quinidine Other (See Comments)    Unknown    Current Outpatient Medications on File Prior to Visit  Medication Sig   acetaminophen (TYLENOL) 500 MG tablet Take 1,000 mg by mouth every 6 (six) hours as needed for moderate pain.   amLODipine (NORVASC) 10 MG tablet TAKE 1/2 TO 1 TABLET BY MOUTH DAILY FOR BLOOD PRESSURE   atenolol (TENORMIN) 100 MG tablet TAKE 1 TABLET BY MOUTH DAILY FOR BLOOD PRESSURE   Dulaglutide (TRULICITY) 1.5 ML/4.6TK SOPN Inject 1.5 mg into the skin once a week.   famotidine (PEPCID) 40 MG tablet TAKE 1 TABLET BY MOUTH AT BEDTIME FOR INDIGESTION OR ACID REFLUX   glyBURIDE (DIABETA) 5 MG tablet Take 1/2 to 1 tablet     3 x /day       with Meals for Diabetes (Patient taking differently: Take 5 mg by mouth daily as needed (blood sugar above 200).)   hydrochlorothiazide (HYDRODIURIL) 25 MG tablet Take 1 tablet (25 mg total) by mouth daily.   leflunomide (ARAVA) 10 MG tablet Take 10 mg by mouth daily.   Loperamide HCl (IMODIUM PO) Take by mouth.   meclizine (ANTIVERT) 25 MG tablet Take 25 mg by mouth 3 (three) times daily as needed for dizziness.   metFORMIN (GLUCOPHAGE XR) 500 MG 24 hr tablet Take     1 to 2 tablets       2 x /day        with Meals      for Diabetes   ondansetron (ZOFRAN) 8 MG tablet Take 1/2 to 1 tablet 3 x day if needed for Nausea   tamsulosin (FLOMAX) 0.4 MG CAPS capsule TAKE 1 CAPSULE BY MOUTH AT BEDTIME FOR PROSTATE   azithromycin (ZITHROMAX) 250 MG tablet Take 2 tablets (500  mg) on Day 1 followed by 1 tablet  (250 mg) daily until complete. (Patient not taking: Reported on 04/03/2022)   B-D 3CC LUER-LOK SYR 21GX1" 21G X 1" 3 ML MISC use as directed   benzonatate (TESSALON PERLES) 100 MG capsule Take 1 capsule (100 mg) TID PRN for cough. (Patient not taking: Reported on 04/03/2022)   blood glucose meter kit and supplies KIT  Dispense based on  insurance coverage. Test blood sugar twice daily. #100 lancet and strips with 3 refills   Blood Glucose Monitoring Suppl (ONETOUCH VERIO) w/Device KIT Test blood sugar once daily.   Blood Glucose Monitoring Suppl DEVI Use meter three times a day to check blood sugar   glucose blood (ONETOUCH VERIO) test strip Test sugars 3 x a day for hypoglycemia/hyperglycemia and fluctuating sugars   Lancets (ONETOUCH ULTRASOFT) lancets Test blood sugar once daily   No current facility-administered medications on file prior to visit.    Review of Systems  Constitutional:  Negative for chills, fever and weight loss.  HENT:  Negative for congestion and hearing loss.   Eyes:  Negative for blurred vision and double vision.  Respiratory:  Negative for cough and shortness of breath.   Cardiovascular:  Negative for chest pain, palpitations, orthopnea and leg swelling.  Gastrointestinal:  Positive for diarrhea. Negative for abdominal pain, constipation, heartburn, nausea and vomiting.  Musculoskeletal:  Negative for falls, joint pain and myalgias.  Skin:  Negative for rash.       Skin tear with scab left shin  Neurological:  Negative for dizziness, tingling, tremors, loss of consciousness and headaches.  Psychiatric/Behavioral:  Negative for depression, memory loss and suicidal ideas.      Physical Exam:  BP (!) 140/70   Pulse 77   Temp 97.7 F (36.5 C)   Ht 5' 7.5" (1.715 m)   Wt 218 lb (98.9 kg)   SpO2 96%   BMI 33.64 kg/m   General Appearance: Well nourished, in no apparent distress. Eyes: PERRLA, EOMs, conjunctiva no swelling or  erythema Sinuses: No Frontal/maxillary tenderness ENT/Mouth: Ext aud canals clear, TMs without erythema, bulging. No erythema, swelling, or exudate on post pharynx.  Tonsils not swollen or erythematous. Hearing normal.  Neck: Supple, thyroid normal.  Respiratory: Respiratory effort normal, BS equal bilaterally without rales, rhonchi, wheezing or stridor.  Cardio: RRR with no MRGs. Brisk peripheral pulses without edema.  Abdomen: Soft, + BS.  Non tender, no guarding, rebound, hernias, masses. Lymphatics: Non tender without lymphadenopathy.  Musculoskeletal: Full ROM, 5/5 strength, normal gait.  Skin: Warm, dry. 2 cm scabbed area on left shin, minimal erythema surrounding Neuro: Cranial nerves intact. Normal muscle tone, no cerebellar symptoms. Sensation intact.  Psych: Awake and oriented X 3, normal affect, Insight and Judgment appropriate.     Alycia Rossetti, NP 9:15 AM [facility]

## 2022-04-03 NOTE — Patient Instructions (Signed)
Stop Trulicity and see if diarrhea resolves Continue Metformin 500 mg 1 time a day in the AM Take glyburide 5 mg 1/2 tab twice a day Check blood sugar daily.  If running greater than 200 3 days in a row restart Trulicity at th 3.54 mg dose    Diabetes Mellitus and Nutrition, Adult When you have diabetes, or diabetes mellitus, it is very important to have healthy eating habits because your blood sugar (glucose) levels are greatly affected by what you eat and drink. Eating healthy foods in the right amounts, at about the same times every day, can help you: Manage your blood glucose. Lower your risk of heart disease. Improve your blood pressure. Reach or maintain a healthy weight. What can affect my meal plan? Every person with diabetes is different, and each person has different needs for a meal plan. Your health care provider may recommend that you work with a dietitian to make a meal plan that is best for you. Your meal plan may vary depending on factors such as: The calories you need. The medicines you take. Your weight. Your blood glucose, blood pressure, and cholesterol levels. Your activity level. Other health conditions you have, such as heart or kidney disease. How do carbohydrates affect me? Carbohydrates, also called carbs, affect your blood glucose level more than any other type of food. Eating carbs raises the amount of glucose in your blood. It is important to know how many carbs you can safely have in each meal. This is different for every person. Your dietitian can help you calculate how many carbs you should have at each meal and for each snack. How does alcohol affect me? Alcohol can cause a decrease in blood glucose (hypoglycemia), especially if you use insulin or take certain diabetes medicines by mouth. Hypoglycemia can be a life-threatening condition. Symptoms of hypoglycemia, such as sleepiness, dizziness, and confusion, are similar to symptoms of having too much  alcohol. Do not drink alcohol if: Your health care provider tells you not to drink. You are pregnant, may be pregnant, or are planning to become pregnant. If you drink alcohol: Limit how much you have to: 0-1 drink a day for women. 0-2 drinks a day for men. Know how much alcohol is in your drink. In the [place] one drink equals one 12 oz bottle of beer (355 mL), one 5 oz glass of wine (148 mL), or one 1 oz glass of hard liquor (44 mL). Keep yourself hydrated with water, diet soda, or unsweetened iced tea. Keep in mind that regular soda, juice, and other mixers may contain a lot of sugar and must be counted as carbs. What are tips for following this plan?  Reading food labels Start by checking the serving size on the Nutrition Facts label of packaged foods and drinks. The number of calories and the amount of carbs, fats, and other nutrients listed on the label are based on one serving of the item. Many items contain more than one serving per package. Check the total grams (g) of carbs in one serving. Check the number of grams of saturated fats and trans fats in one serving. Choose foods that have a low amount or none of these fats. Check the number of milligrams (mg) of salt (sodium) in one serving. Most people should limit total sodium intake to less than 2,300 mg per day. Always check the nutrition information of foods labeled as "low-fat" or "nonfat." These foods may be higher in added sugar or refined carbs  and should be avoided. Talk to your dietitian to identify your daily goals for nutrients listed on the label. Shopping Avoid buying canned, pre-made, or processed foods. These foods tend to be high in fat, sodium, and added sugar. Shop around the outside edge of the grocery store. This is where you will most often find fresh fruits and vegetables, bulk grains, fresh meats, and fresh dairy products. Cooking Use low-heat cooking methods, such as baking, instead of high-heat cooking methods,  such as deep frying. Cook using healthy oils, such as olive, canola, or sunflower oil. Avoid cooking with butter, cream, or high-fat meats. Meal planning Eat meals and snacks regularly, preferably at the same times every day. Avoid going long periods of time without eating. Eat foods that are high in fiber, such as fresh fruits, vegetables, beans, and whole grains. Eat 4-6 oz (112-168 g) of lean protein each day, such as lean meat, chicken, fish, eggs, or tofu. One ounce (oz) (28 g) of lean protein is equal to: 1 oz (28 g) of meat, chicken, or fish. 1 egg.  cup (62 g) of tofu. Eat some foods each day that contain healthy fats, such as avocado, nuts, seeds, and fish. What foods should I eat? Fruits Berries. Apples. Oranges. Peaches. Apricots. Plums. Grapes. Mangoes. Papayas. Pomegranates. Kiwi. Cherries. Vegetables Leafy greens, including lettuce, spinach, kale, chard, collard greens, mustard greens, and cabbage. Beets. Cauliflower. Broccoli. Carrots. Green beans. Tomatoes. Peppers. Onions. Cucumbers. Brussels sprouts. Grains Whole grains, such as whole-wheat or whole-grain bread, crackers, tortillas, cereal, and pasta. Unsweetened oatmeal. Quinoa. Brown or wild rice. Meats and other proteins Seafood. Poultry without skin. Lean cuts of poultry and beef. Tofu. Nuts. Seeds. Dairy Low-fat or fat-free dairy products such as milk, yogurt, and cheese. The items listed above may not be a complete list of foods and beverages you can eat and drink. Contact a dietitian for more information. What foods should I avoid? Fruits Fruits canned with syrup. Vegetables Canned vegetables. Frozen vegetables with butter or cream sauce. Grains Refined white flour and flour products such as bread, pasta, snack foods, and cereals. Avoid all processed foods. Meats and other proteins Fatty cuts of meat. Poultry with skin. Breaded or fried meats. Processed meat. Avoid saturated fats. Dairy Full-fat yogurt,  cheese, or milk. Beverages Sweetened drinks, such as soda or iced tea. The items listed above may not be a complete list of foods and beverages you should avoid. Contact a dietitian for more information. Questions to ask a health care provider Do I need to meet with a certified diabetes care and education specialist? Do I need to meet with a dietitian? What number can I call if I have questions? When are the best times to check my blood glucose? Where to find more information: American Diabetes Association: diabetes.org Academy of Nutrition and Dietetics: eatright.[employer] of Diabetes and Digestive and Kidney Diseases: [employer] Association of Diabetes Care & Education Specialists: diabeteseducator.org Summary It is important to have healthy eating habits because your blood sugar (glucose) levels are greatly affected by what you eat and drink. It is important to use alcohol carefully. A healthy meal plan will help you manage your blood glucose and lower your risk of heart disease. Your health care provider may recommend that you work with a dietitian to make a meal plan that is best for you. This information is not intended to replace advice given to you by your health care provider. Make sure you discuss any questions you have with your  health care provider. Document Revised: 02/11/2020 Document Reviewed: 02/11/2020 Elsevier Patient Education  [place].

## 2022-04-11 ENCOUNTER — Telehealth: Payer: Self-pay

## 2022-04-11 NOTE — Telephone Encounter (Signed)
Prior auth for Trulicity completed and approved through 04/10/23.

## 2022-04-18 ENCOUNTER — Other Ambulatory Visit: Payer: Self-pay

## 2022-04-18 DIAGNOSIS — I10 Essential (primary) hypertension: Secondary | ICD-10-CM

## 2022-04-18 MED ORDER — ATENOLOL 100 MG PO TABS: 100.0000 mg | ORAL_TABLET | Freq: Every day | ORAL | 1 refills | Status: DC

## 2022-05-15 ENCOUNTER — Encounter: Payer: Self-pay | Admitting: Internal Medicine

## 2022-05-15 LAB — HM DIABETES EYE EXAM

## 2022-05-31 NOTE — Progress Notes (Signed)
Assessment and Plan:  There are no diagnoses linked to this encounter.  Phillip Powell was seen today for follow-up.  Diagnoses and all orders for this visit:  Type 2 diabetes mellitus with stage 2 chronic kidney disease, without long-term current use of insulin (HCC) Continue Trulicity 9.51 mg, Metformin 500 mg QAM, Glyburide 5 mg 1/2 tab at breakfast and dinner Continue limiting simple carbs Continue to monitor blood sugars Perform daily foot/skin checks, notify office of any concerning changes -     COMPLETE METABOLIC PANEL WITH GFR -     Hemoglobin A1c  Essential hypertension - continue medications, DASH diet, exercise and monitor at home. Call if greater than 130/80.  Go to the ER if any chest pain, shortness of breath, nausea, dizziness, severe HA, changes vision/speech  -     CBC with Differential/Platelet  Obesity (BMI 30.0-34.9) Continue to limit saturated fats and simple carbs Increase activity  CKD stage 2 due to type 2 diabetes mellitus (HCC) Increase fluids, avoid NSAIDS, monitor sugars, will monitor -     COMPLETE METABOLIC PANEL WITH GFR  Hyperlipidemia associated with type 2 diabetes mellitus ([place]) Controlled without medication, continue diet and exercise -     COMPLETE METABOLIC PANEL WITH GFR -     Lipid panel  Aortic atherosclerosis (HCC) Control blood pressure, lipids and glucose Disscused lifestyle modifications, diet & exercise Continue to monitor  Flu vaccine need -     Flu vaccine HIGH DOSE PF    Further disposition pending results of labs. Discussed med's effects and SE's.   Over 30 minutes of exam, counseling, chart review, and critical decision making was performed.   Future Appointments  Date Time Provider [place]  06/05/2022  9:30 AM Alycia Rossetti, NP [facility] None  07/31/2022  9:00 AM Unk Pinto, MD [facility] None  11/15/2022 10:00 AM Alycia Rossetti, NP [facility] None     ------------------------------------------------------------------------------------------------------------------   HPI BP 132/70   Pulse 75   Temp 97.9 F (36.6 C)   Ht 5' 7.5" (1.715 m)   Wt 220 lb 6.4 oz (100 kg)   SpO2 95%   BMI 34.01 kg/m    76 y.o.male presents for reevaluation of blood sugars  He is currently using Glyburide 5 mg 1/2 tab at breakfast and dinner as well as Metformin 500 mg QAM.  Currently Trulicity 8.84 mg and diarrhea is much better controlled. He does not take a low dose ASA or ACE/ARB. Blood sugars have been 125-160. Has not been eating sweets  Lab Results  Component Value Date   HGBA1C 9.4 (H) 02/27/2022   He drinks several glasses of water.  Lab Results  Component Value Date   EGFR 81 02/27/2022    BP is currently well controlled with HCTZ 25 mg, Amlodipine 10 mg and Atenolol 100 mg QD.  Denies headaches, chest pain, shortness of breath and dizziness.  BP Readings from Last 3 Encounters:  06/05/22 132/70  04/03/22 (!) 140/70  03/14/22 (!) 154/80    BMI is Body mass index is 34.01 kg/m., he has not been working on diet and exercise. Wt Readings from Last 3 Encounters:  06/05/22 220 lb 6.4 oz (100 kg)  04/03/22 218 lb (98.9 kg)  03/14/22 221 lb (100.2 kg)   He has noticed diarrhea is much better unless he overeats he will have episode of diarrhea.   Past Medical History:  Diagnosis Date    Covid-19 Positive (02/06/2019)  02/11/2019   Arthritis    BPH (  benign prostatic hypertrophy)    Diabetes mellitus without complication (HCC)    type w    GERD (gastroesophageal reflux disease)    Hepatic steatosis    History of kidney stones    Hydroureteronephrosis    Hypertension    Hypogonadism male    Mass    left shoulder AC joint   OSA (obstructive sleep apnea)    wears CPAP wears 4 hours per nite    PONV (postoperative nausea and vomiting)    Renal calculi    Sarcoidosis    Vitamin D deficiency      Allergies  Allergen  Reactions   Ace Inhibitors Other (See Comments)    Unknown   Ibuprofen Other (See Comments)    GI upset   Invokana [Canagliflozin] Other (See Comments)    Unknown   Quinidine Other (See Comments)    Unknown    Current Outpatient Medications on File Prior to Visit  Medication Sig   acetaminophen (TYLENOL) 500 MG tablet Take 1,000 mg by mouth every 6 (six) hours as needed for moderate pain.   amLODipine (NORVASC) 10 MG tablet TAKE 1/2 TO 1 TABLET BY MOUTH DAILY FOR BLOOD PRESSURE   atenolol (TENORMIN) 100 MG tablet Take 1 tablet (100 mg total) by mouth daily. for blood pressure   B-D 3CC LUER-LOK SYR 21GX1" 21G X 1" 3 ML MISC use as directed   blood glucose meter kit and supplies KIT Dispense based on  insurance coverage. Test blood sugar twice daily. #100 lancet and strips with 3 refills   Blood Glucose Monitoring Suppl (ONETOUCH VERIO) w/Device KIT Test blood sugar once daily.   Blood Glucose Monitoring Suppl DEVI Use meter three times a day to check blood sugar   Dulaglutide (TRULICITY) 7.03 JK/0.9FG SOPN Inject 0.75 mg into the skin once a week.   famotidine (PEPCID) 40 MG tablet TAKE 1 TABLET BY MOUTH AT BEDTIME FOR INDIGESTION OR ACID REFLUX   glucose blood (ONETOUCH VERIO) test strip Test sugars 3 x a day for hypoglycemia/hyperglycemia and fluctuating sugars   glyBURIDE (DIABETA) 5 MG tablet Take 1/2 to 1 tablet     3 x /day       with Meals for Diabetes (Patient taking differently: Take 5 mg by mouth daily as needed (blood sugar above 200).)   hydrochlorothiazide (HYDRODIURIL) 25 MG tablet Take 1 tablet (25 mg total) by mouth daily.   Lancets (ONETOUCH ULTRASOFT) lancets Test blood sugar once daily   leflunomide (ARAVA) 10 MG tablet Take 10 mg by mouth daily.   Loperamide HCl (IMODIUM PO) Take by mouth.   meclizine (ANTIVERT) 25 MG tablet Take 25 mg by mouth 3 (three) times daily as needed for dizziness.   metFORMIN (GLUCOPHAGE XR) 500 MG 24 hr tablet Take     1 to 2 tablets        2 x /day        with Meals      for Diabetes   ondansetron (ZOFRAN) 8 MG tablet Take 1/2 to 1 tablet 3 x day if needed for Nausea   tamsulosin (FLOMAX) 0.4 MG CAPS capsule TAKE 1 CAPSULE BY MOUTH AT BEDTIME FOR PROSTATE   No current facility-administered medications on file prior to visit.    ROS: all negative except above.   Physical Exam:  BP 132/70   Pulse 75   Temp 97.9 F (36.6 C)   Ht 5' 7.5" (1.715 m)   Wt 220 lb 6.4 oz (100 kg)  SpO2 95%   BMI 34.01 kg/m   General Appearance: Well nourished, in no apparent distress. Eyes: PERRLA, EOMs, conjunctiva no swelling or erythema Sinuses: No Frontal/maxillary tenderness ENT/Mouth: Ext aud canals clear, TMs without erythema, bulging. No erythema, swelling, or exudate on post pharynx.  Tonsils not swollen or erythematous. Hearing normal.  Neck: Supple, thyroid normal.  Respiratory: Respiratory effort normal, BS equal bilaterally without rales, rhonchi, wheezing or stridor.  Cardio: RRR with no MRGs. Brisk peripheral pulses without edema.  Abdomen: Soft, + BS.  Non tender, no guarding, rebound, hernias, masses. Lymphatics: Non tender without lymphadenopathy.  Musculoskeletal: Full ROM, 5/5 strength, normal gait.  Skin: Warm, dry without rashes, lesions, ecchymosis.  Stasis dermatitis of lower legs with mild redness, scabbed area on left hand no signs of infection Neuro: Cranial nerves intact. Normal muscle tone, no cerebellar symptoms. Sensation intact.  Psych: Awake and oriented X 3, normal affect, Insight and Judgment appropriate.     Alycia Rossetti, NP 9:24 AM [facility]

## 2022-06-05 ENCOUNTER — Encounter: Payer: Self-pay | Admitting: Nurse Practitioner

## 2022-06-05 ENCOUNTER — Ambulatory Visit: Payer: Managed Care, Other (non HMO) | Admitting: Nurse Practitioner

## 2022-06-05 VITALS — BP 132/70 | HR 75 | Temp 97.9°F | Ht 67.5 in | Wt 220.4 lb

## 2022-06-05 DIAGNOSIS — Z23 Encounter for immunization: Secondary | ICD-10-CM

## 2022-06-05 DIAGNOSIS — E1122 Type 2 diabetes mellitus with diabetic chronic kidney disease: Secondary | ICD-10-CM | POA: Diagnosis not present

## 2022-06-05 DIAGNOSIS — E669 Obesity, unspecified: Secondary | ICD-10-CM

## 2022-06-05 DIAGNOSIS — E785 Hyperlipidemia, unspecified: Secondary | ICD-10-CM | POA: Diagnosis not present

## 2022-06-05 DIAGNOSIS — I10 Essential (primary) hypertension: Secondary | ICD-10-CM

## 2022-06-05 DIAGNOSIS — E1169 Type 2 diabetes mellitus with other specified complication: Secondary | ICD-10-CM | POA: Diagnosis not present

## 2022-06-05 DIAGNOSIS — I700 Atherosclerosis of aorta: Secondary | ICD-10-CM

## 2022-06-05 DIAGNOSIS — N182 Chronic kidney disease, stage 2 (mild): Secondary | ICD-10-CM

## 2022-06-05 LAB — COMPLETE METABOLIC PANEL WITH GFR
AG Ratio: 1.7 (calc) (ref 1.0–2.5)
ALT: 12 U/L (ref 9–46)
AST: 13 U/L (ref 10–35)
Albumin: 4 g/dL (ref 3.6–5.1)
Alkaline phosphatase (APISO): 73 U/L (ref 35–144)
BUN: 18 mg/dL (ref 7–25)
CO2: 32 mmol/L (ref 20–32)
Calcium: 9.1 mg/dL (ref 8.6–10.3)
Chloride: 97 mmol/L — ABNORMAL LOW (ref 98–110)
Creat: 0.96 mg/dL (ref 0.70–1.28)
Globulin: 2.4 g/dL (calc) (ref 1.9–3.7)
Glucose, Bld: 170 mg/dL — ABNORMAL HIGH (ref 65–99)
Potassium: 3.9 mmol/L (ref 3.5–5.3)
Sodium: 139 mmol/L (ref 135–146)
Total Bilirubin: 0.7 mg/dL (ref 0.2–1.2)
Total Protein: 6.4 g/dL (ref 6.1–8.1)
eGFR: 83 mL/min/{1.73_m2} (ref >=60)

## 2022-06-05 LAB — HEMOGLOBIN A1C
Hgb A1c MFr Bld: 8.7 % of total Hgb — ABNORMAL HIGH (ref <5.7)
Mean Plasma Glucose: 203 mg/dL
eAG (mmol/L): 11.2 mmol/L

## 2022-06-05 LAB — CBC WITH DIFFERENTIAL/PLATELET
Absolute Monocytes: 660 cells/uL (ref 200–950)
Basophils Absolute: 84 cells/uL (ref 0–200)
Basophils Relative: 0.9 %
Eosinophils Absolute: 167 cells/uL (ref 15–500)
Eosinophils Relative: 1.8 %
HCT: 43.7 % (ref 38.5–50.0)
Hemoglobin: 14.7 g/dL (ref 13.2–17.1)
Lymphs Abs: 2158 cells/uL (ref 850–3900)
MCH: 27 pg (ref 27.0–33.0)
MCHC: 33.6 g/dL (ref 32.0–36.0)
MCV: 80.2 fL (ref 80.0–100.0)
MPV: 9.4 fL (ref 7.5–12.5)
Monocytes Relative: 7.1 %
Neutro Abs: 6231 cells/uL (ref 1500–7800)
Neutrophils Relative %: 67 %
Platelets: 217 10*3/uL (ref 140–400)
RBC: 5.45 10*6/uL (ref 4.20–5.80)
RDW: 13.7 % (ref 11.0–15.0)
Total Lymphocyte: 23.2 %
WBC: 9.3 10*3/uL (ref 3.8–10.8)

## 2022-06-05 LAB — LIPID PANEL
Cholesterol: 164 mg/dL (ref <200)
HDL: 38 mg/dL — ABNORMAL LOW (ref >=40)
LDL Cholesterol (Calc): 94 mg/dL (calc)
Non-HDL Cholesterol (Calc): 126 mg/dL (calc) (ref <130)
Total CHOL/HDL Ratio: 4.3 (calc) (ref <5.0)
Triglycerides: 230 mg/dL — ABNORMAL HIGH (ref <150)

## 2022-06-05 NOTE — Patient Instructions (Signed)

## 2022-07-04 ENCOUNTER — Encounter: Payer: Self-pay | Admitting: Internal Medicine

## 2022-07-06 ENCOUNTER — Encounter: Payer: Self-pay | Admitting: Internal Medicine

## 2022-07-13 ENCOUNTER — Other Ambulatory Visit: Payer: Self-pay | Admitting: Internal Medicine

## 2022-07-13 DIAGNOSIS — E1122 Type 2 diabetes mellitus with diabetic chronic kidney disease: Secondary | ICD-10-CM

## 2022-07-31 ENCOUNTER — Encounter: Payer: Managed Care, Other (non HMO) | Admitting: Internal Medicine

## 2022-09-04 ENCOUNTER — Other Ambulatory Visit: Payer: Self-pay | Admitting: Internal Medicine

## 2022-09-04 DIAGNOSIS — E1122 Type 2 diabetes mellitus with diabetic chronic kidney disease: Secondary | ICD-10-CM

## 2022-09-04 MED ORDER — GLYBURIDE 5 MG PO TABS: ORAL_TABLET | ORAL | 0 refills | Status: DC

## 2022-09-24 NOTE — Patient Instructions (Signed)

## 2022-09-24 NOTE — Progress Notes (Unsigned)
Annual  Screening/Preventative Visit  & Comprehensive Evaluation & Examination   Future Appointments  Date Time Provider Department  09/25/2022                           cpe 11:00 AM Unk Pinto, MD [facility]  01/01/2023                        wellness  9:30 AM Alycia Rossetti, NP [facility]  09/27/2023                           cpe 11:00 AM Unk Pinto, MD [facility]         [name] with  HTN, HLD, T2_NIDDM  and Vitamin D Deficiency presents for a Screening /Preventative Visit & comprehensive evaluation and management of multiple medical co-morbidities.  Patient is on CPAP for OSA with improved sleep hygiene.    On 09/19/2019  Abd CT scan showed Aortic atherosclerosis. Patient has GERD controlled on hid meds        HTN predates since 81. Patient's BP has been controlled at home.  Today's BP is at goal - 136/78. Patient denies any cardiac symptoms as chest pain, palpitations, shortness of breath, dizziness or ankle swelling.        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  Last A1c was not at goal:  Lab Results  Component Value Date   HGBA1C 8.7 (H) 06/05/2022                     Further, the patient also has history of Vitamin D Deficiency ("37" /2008) and supplements vitamin D without any suspected side-effects. Last vitamin D was low :  Lab Results  Component Value Date   VD25OH 35 07/26/2021            Future Appointments  Date Time Provider [place]  02/07/2021  9:30 AM Unk Pinto, MD [facility] None  07/26/2021 11:00 AM Unk Pinto, MD [facility] None    History of Present Illness:       [name] presents for 6 month follow up with HTN, HLD, T2_NIDDM and Vitamin D Deficiency.  In May 2022, patient underwent a total Lt. TKA.  Patient has hx/o Gout (off meds) . He also has hx/o OSA on CPAP with improved restorative sleep.  On 09/19/2019, Abd CT scan showed Aortic atherosclerosis. Patient has GERD controlled on hid meds.       Patient is treated for HTN  (1985) & BP has been controlled at home. Today's BP is at goal - 128/74. Patient has had no complaints of any cardiac type chest pain, palpitations, dyspnea / orthopnea / PND, dizziness, claudication, or dependent edema.       Hyperlipidemia is controlled with diet & meds. Patient denies myalgias or other med SE's. 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  Last A1c was not at goal:  Lab Results  Component Value Date   HGBA1C 8.7 (H) 06/05/2022                                                        Further, the patient also has history of Vitamin D Deficiency ("37" /2008) and supplements vitamin D without any suspected side-effects. Last vitamin D was low :  Lab Results  Component Value Date   VD25OH 35 07/26/2021         Current Outpatient Medications on File Prior to Visit  Medication Sig   acetaminophen (TYLENOL) 500 MG tablet Take 1,000 mg by mouth every 6 (six) hours as needed for moderate pain.   amLODipine (NORVASC) 10 MG tablet Take  1/2 to 1 tablet  Daily  for BP (Patient taking differently: No sig reported)   atenolol (TENORMIN) 100 MG tablet TAKE 1 TABLET BY MOUTH DAILY FOR BLOOD PRESSURE    Dulaglutide (TRULICITY) 1.5 0000000 SOPN Inject 1.5 mg into the skin once a week.   famotidine (PEPCID) 40 MG tablet Take     1 tablet      at Bedtime       for Indigestion & Acid Reflux (Patient taking differently: Take 40 mg by mouth daily.)   gabapentin (NEURONTIN) 100 MG capsule Take 1 to 2 capsules 2 to 3 x  /day as needed for Chronic Pain (Patient taking differently: Take 1 to 2 capsules 2 to 3 x  /day as needed for Chronic Pain)   glyBURIDE (DIABETA) 5 MG tablet Take 1/2 to 1 tablet     3 x /day       with Meals for Diabetes (Patient taking differently: Take 5 mg by mouth daily as needed (blood sugar above 200).)   leflunomide (ARAVA) 10 MG tablet Take 10 mg by mouth daily.   meclizine (ANTIVERT) 25 MG tablet Take 25 mg by mouth 3 (three) times daily as needed for dizziness.   metFORMIN XR 500 MG 24 hr tablet Take     1 to 2 tablets       2 x /day        with Meals      for Diabetes (Patient taking differently: Take 500 mg by mouth daily with breakfast.)   methocarbamol (ROBAXIN) 500 MG tablet Take 1 tablet (500 mg total) by mouth every 6 (six) hours as needed for muscle spasms.   olmesartan (BENICAR) 40 MG tablet Take 1 tablet (40 mg total) by mouth daily.   ondansetron (ZOFRAN) 8 MG tablet Take 1/2 to 1 tablet 3 x day if needed for Nausea   oxybutynin (DITROPAN) 5 MG tablet Take 1 tablet (5 mg total) by mouth every 8 (eight) hours as needed for bladder spasms.   tamsulosin (FLOMAX) 0.4 MG CAPS capsule Take      1 tablet       at Bedtime  for Prostate (Patient taking differently: Take 0.4 mg by mouth at bedtime.)   traMADol (ULTRAM) 50 MG tablet Take 1-2 tablets (50-100 mg total) by mouth every 6 (six) hours as needed for moderate pain.    Allergies  Allergen Reactions   Ace Inhibitors Other (See Comments)    Unknown   Ibuprofen Other (See Comments)    GI upset   Invokana [Canagliflozin] Other (See Comments)    Unknown   Quinidine Other (See Comments)    Unknown    PMHx:    Past Medical History:  Diagnosis Date    Covid-19 Positive (02/06/2019)  02/11/2019   Arthritis    BPH (benign prostatic hypertrophy)    Diabetes mellitus without complication ([place])    type w    GERD (gastroesophageal reflux disease)    Hepatic steatosis    History of kidney stones    Hydroureteronephrosis    Hypertension    Hypogonadism male    Mass    left shoulder AC joint   OSA (obstructive sleep apnea)    wears CPAP wears 4 hours per nite    PONV (postoperative nausea and vomiting)    Renal calculi    Sarcoidosis    Vitamin D deficiency      Immunization History  Administered Date(s) Administered   Fluad Quad(high Dose 65+) 04/28/2019   Influenza Split 06/23/2013   Influenza, High Dose Seasonal PF 06/29/2014, 05/24/2015, 04/24/2016, 06/11/2017, 07/08/2020   Influenza,inj,quad, With Preservative 04/23/2017   Influenza-Unspecified 04/28/2019   PFIZER(Purple Top)SARS-COV-2 Vaccination 08/23/2019, 09/09/2019, 09/13/2019   Pneumococcal Conjugate-13 01/05/2014   Pneumococcal-Unspecified 07/24/1994   Td 06/23/2002   Zoster, Live 10/23/2010     Past Surgical History:  Procedure Laterality Date   APPENDECTOMY     COLONOSCOPY     CYSTOSCOPY/URETEROSCOPY/HOLMIUM LASER/STENT PLACEMENT Right 10/01/2019   Procedure: CYSTOSCOPY/RETROGRADE/URETEROSCOPY/HOLMIUM LASER/STENT PLACEMENT;  Surgeon: Ceasar Mons, MD;  Location: [facility];  Service: Urology;  Laterality: Right;  ONLY NEEDS 45 MIN   HAND SURGERY Right 2019   Dr. Amedeo Plenty, tendon repair of R thumb   HERNIA REPAIR  [id number]   umbilical   KNEE ARTHROSCOPY     ORIF CLAVICULAR FRACTURE Left 03/29/2018   Procedure: LEFT SHOULDER OPEN DISTAL CLAVICLE RESECTION, CYST EXCISION;  Surgeon: Netta Cedars, MD;  Location: [place];  Service: Orthopedics;  Laterality: Left;   ROTATOR CUFF REPAIR Left 1996   ROTATOR CUFF REPAIR Right [place]   TOTAL KNEE ARTHROPLASTY Left 11/22/2020   Procedure: TOTAL KNEE  ARTHROPLASTY;  Surgeon: Gaynelle Arabian, MD;  Location: [facility];  Service: Orthopedics;  Laterality: Left;    FHx:    Reviewed / unchanged  SHx:    Reviewed / unchanged   Systems Review:  Constitutional: Denies fever, chills, wt changes, headaches, insomnia, fatigue, night sweats, change in appetite. Eyes: Denies redness, blurred vision, diplopia, discharge, itchy, watery eyes.  ENT: Denies discharge, congestion, post nasal drip, epistaxis, sore throat, earache, hearing loss, dental pain, tinnitus, vertigo, sinus pain, snoring.  CV: Denies chest pain, palpitations, irregular heartbeat, syncope, dyspnea, diaphoresis, orthopnea, PND, claudication or edema. Respiratory: denies cough, dyspnea, DOE, pleurisy, hoarseness, laryngitis, wheezing.  Gastrointestinal: Denies dysphagia, odynophagia, heartburn, reflux, water brash, abdominal pain or cramps, nausea, vomiting, bloating, diarrhea, constipation, hematemesis, melena, hematochezia  or hemorrhoids. Genitourinary: Denies dysuria, frequency, urgency, nocturia, hesitancy, discharge, hematuria or flank pain. Musculoskeletal: Denies arthralgias, myalgias, stiffness, jt. swelling, pain, limping or strain/sprain.  Skin: Denies pruritus, rash, hives, warts, acne, eczema or change  in skin lesion(s). Neuro: No weakness, tremor, incoordination, spasms, paresthesia or pain. Psychiatric: Denies confusion, memory loss or sensory loss. Endo: Denies change in weight, skin or hair change.  Heme/Lymph: No excessive bleeding, bruising or enlarged lymph nodes.   Physical Exam  BP 136/78   Pulse 82   Temp 97.8 F (36.6 C)   Resp 16   Ht 5' 7.5" (1.715 m)   Wt 227 lb 3.2 oz (103.1 kg)   SpO2 96%   BMI 35.06 kg/m   General Appearance: Over  nourished and well groomed and in no apparent distress.  Eyes: PERRLA, EOMs, conjunctiva no swelling or erythema, normal fundi and vessels. Sinuses: No frontal/maxillary tenderness ENT/Mouth: EACs patent / TMs  nl.  Nares clear without erythema, swelling, mucoid exudates. Oral hygiene is good. No erythema, swelling, or exudate. Tongue normal, non-obstructing. Tonsils not swollen or erythematous. Hearing normal.  Neck: Supple, thyroid not palpable. No bruits, nodes or JVD. Respiratory: Respiratory effort normal.  BS equal and clear bilateral without rales, rhonci, wheezing or stridor. Cardio: Heart sounds are normal with regular rate and rhythm and no murmurs, rubs or gallops. Peripheral pulses are normal and equal bilaterally without edema. No aortic or femoral bruits. Chest: symmetric with normal excursions and percussion.  Abdomen: Soft, with Nl bowel sounds. Nontender, no guarding, rebound, hernias, masses, or organomegaly.  Lymphatics: Non tender without lymphadenopathy.  Musculoskeletal: Full ROM all peripheral extremities, joint stability, 5/5 strength, and normal gait. Skin: Warm and dry without rashes, lesions, cyanosis, clubbing or  ecchymosis.  Neuro: Cranial nerves intact, reflexes equal bilaterally. Normal muscle tone, no cerebellar symptoms. Sensation intact.  Pysch: Alert and oriented X 3 with normal affect, insight and judgment appropriate.   Assessment and Plan  1. Annual Preventative/Screening Exam    1. Encounter for general adult medical examination with abnormal findings   2. Essential hypertension  - EKG 12-Lead - [place], RETROPERITNL ABD,  LTD - Urinalysis, Routine w reflex microscopic - Microalbumin / creatinine urine ratio - CBC with Differential/Platelet - COMPLETE METABOLIC PANEL WITH GFR - Magnesium - Lipid panel  3. Hyperlipidemia associated with type 2 diabetes mellitus ([place])  - EKG 12-Lead - [place], RETROPERITNL ABD,  LTD - Lipid panel - TSH - Insulin, random  4. Type 2 diabetes mellitus with stage 2 chronic kidney  disease, without long-term current use of insulin ([facility])  - EKG 12-Lead - [place], RETROPERITNL ABD,  LTD - HM DIABETES FOOT EXAM - PR LOW EXTEMITY NEUR  EXAM DOCUM - CBC with Differential/Platelet - Hemoglobin A1c - Insulin, random  5. Vitamin D deficiency  - VITAMIN D 25 Hydroxy  6. Aortic atherosclerosis ([place]) by Abd CT scan on 09/19/2019  - EKG 12-Lead - [place], RETROPERITNL ABD,  LTD - Lipid panel  7. Gastroesophageal reflux disease   - CBC with Differential/Platelet  8. OSA (obstructive sleep apnea)   9. BPH with obstruction/lower urinary tract symptoms  - PSA  10. Prostate cancer screening  - PSA  11. Screening for colorectal cancer  - POC Hemoccult Bld/Stl  12. Screening for heart disease  - EKG 12-Lead  13. FH: hypertension  - EKG 12-Lead - [place], RETROPERITNL ABD,  LTD  14. Screening for AAA (aortic abdominal aneurysm)  - [place], RETROPERITNL ABD,  LTD  15. Medication management  - CBC with Differential/Platelet - COMPLETE METABOLIC PANEL WITH GFR - Magnesium - Lipid panel - TSH - Hemoglobin A1c - Insulin, random - VITAMIN D 25 Hydroxy  Patient was counseled in prudent diet, weight control to achieve/maintain BMI less than 25, BP monitoring, regular exercise and medications as discussed.  Discussed med effects and SE's. Routine screening labs and tests as requested with regular follow-up as recommended. Over 40 minutes of exam, counseling, chart review and high complex critical decision making was performed

## 2022-09-25 ENCOUNTER — Ambulatory Visit (INDEPENDENT_AMBULATORY_CARE_PROVIDER_SITE_OTHER): Payer: Managed Care, Other (non HMO) | Admitting: Internal Medicine

## 2022-09-25 ENCOUNTER — Encounter: Payer: Self-pay | Admitting: Internal Medicine

## 2022-09-25 VITALS — BP 134/86 | HR 66 | Temp 97.9°F | Resp 16 | Ht 67.5 in | Wt 212.0 lb

## 2022-09-25 DIAGNOSIS — Z13220 Encounter for screening for lipoid disorders: Secondary | ICD-10-CM | POA: Diagnosis not present

## 2022-09-25 DIAGNOSIS — K2100 Gastro-esophageal reflux disease with esophagitis, without bleeding: Secondary | ICD-10-CM

## 2022-09-25 DIAGNOSIS — N182 Chronic kidney disease, stage 2 (mild): Secondary | ICD-10-CM

## 2022-09-25 DIAGNOSIS — Z8249 Family history of ischemic heart disease and other diseases of the circulatory system: Secondary | ICD-10-CM

## 2022-09-25 DIAGNOSIS — Z79899 Other long term (current) drug therapy: Secondary | ICD-10-CM | POA: Diagnosis not present

## 2022-09-25 DIAGNOSIS — Z131 Encounter for screening for diabetes mellitus: Secondary | ICD-10-CM

## 2022-09-25 DIAGNOSIS — E1141 Type 2 diabetes mellitus with diabetic mononeuropathy: Secondary | ICD-10-CM

## 2022-09-25 DIAGNOSIS — G4733 Obstructive sleep apnea (adult) (pediatric): Secondary | ICD-10-CM

## 2022-09-25 DIAGNOSIS — N138 Other obstructive and reflux uropathy: Secondary | ICD-10-CM

## 2022-09-25 DIAGNOSIS — E559 Vitamin D deficiency, unspecified: Secondary | ICD-10-CM | POA: Diagnosis not present

## 2022-09-25 DIAGNOSIS — Z136 Encounter for screening for cardiovascular disorders: Secondary | ICD-10-CM

## 2022-09-25 DIAGNOSIS — Z1389 Encounter for screening for other disorder: Secondary | ICD-10-CM | POA: Diagnosis not present

## 2022-09-25 DIAGNOSIS — Z125 Encounter for screening for malignant neoplasm of prostate: Secondary | ICD-10-CM

## 2022-09-25 DIAGNOSIS — Z1211 Encounter for screening for malignant neoplasm of colon: Secondary | ICD-10-CM

## 2022-09-25 DIAGNOSIS — N401 Enlarged prostate with lower urinary tract symptoms: Secondary | ICD-10-CM

## 2022-09-25 DIAGNOSIS — R350 Frequency of micturition: Secondary | ICD-10-CM

## 2022-09-25 DIAGNOSIS — I10 Essential (primary) hypertension: Secondary | ICD-10-CM

## 2022-09-25 DIAGNOSIS — I700 Atherosclerosis of aorta: Secondary | ICD-10-CM

## 2022-09-25 DIAGNOSIS — Z0000 Encounter for general adult medical examination without abnormal findings: Secondary | ICD-10-CM | POA: Diagnosis not present

## 2022-09-25 DIAGNOSIS — Z0001 Encounter for general adult medical examination with abnormal findings: Secondary | ICD-10-CM

## 2022-09-25 DIAGNOSIS — R197 Diarrhea, unspecified: Secondary | ICD-10-CM

## 2022-09-25 DIAGNOSIS — E1122 Type 2 diabetes mellitus with diabetic chronic kidney disease: Secondary | ICD-10-CM

## 2022-09-25 DIAGNOSIS — E1169 Type 2 diabetes mellitus with other specified complication: Secondary | ICD-10-CM

## 2022-09-25 MED ORDER — HYOSCYAMINE SULFATE 0.125 MG PO TABS: ORAL_TABLET | ORAL | 0 refills | Status: DC

## 2022-09-26 LAB — CBC WITH DIFFERENTIAL/PLATELET
Absolute Monocytes: 476 cells/uL (ref 200–950)
Basophils Absolute: 62 cells/uL (ref 0–200)
Basophils Relative: 0.9 %
Eosinophils Absolute: 159 cells/uL (ref 15–500)
Eosinophils Relative: 2.3 %
HCT: 44.2 % (ref 38.5–50.0)
Hemoglobin: 14.2 g/dL (ref 13.2–17.1)
Lymphs Abs: 2056 cells/uL (ref 850–3900)
MCH: 25.7 pg — ABNORMAL LOW (ref 27.0–33.0)
MCHC: 32.1 g/dL (ref 32.0–36.0)
MCV: 80.1 fL (ref 80.0–100.0)
MPV: 9.1 fL (ref 7.5–12.5)
Monocytes Relative: 6.9 %
Neutro Abs: 4147 cells/uL (ref 1500–7800)
Neutrophils Relative %: 60.1 %
Platelets: 215 10*3/uL (ref 140–400)
RBC: 5.52 10*6/uL (ref 4.20–5.80)
RDW: 14.3 % (ref 11.0–15.0)
Total Lymphocyte: 29.8 %
WBC: 6.9 10*3/uL (ref 3.8–10.8)

## 2022-09-26 LAB — COMPLETE METABOLIC PANEL WITH GFR
AG Ratio: 1.5 (calc) (ref 1.0–2.5)
ALT: 10 U/L (ref 9–46)
AST: 11 U/L (ref 10–35)
Albumin: 4 g/dL (ref 3.6–5.1)
Alkaline phosphatase (APISO): 69 U/L (ref 35–144)
BUN: 19 mg/dL (ref 7–25)
CO2: 30 mmol/L (ref 20–32)
Calcium: 9.4 mg/dL (ref 8.6–10.3)
Chloride: 98 mmol/L (ref 98–110)
Creat: 0.92 mg/dL (ref 0.70–1.28)
Globulin: 2.6 g/dL (calc) (ref 1.9–3.7)
Glucose, Bld: 194 mg/dL — ABNORMAL HIGH (ref 65–99)
Potassium: 3.8 mmol/L (ref 3.5–5.3)
Sodium: 140 mmol/L (ref 135–146)
Total Bilirubin: 0.4 mg/dL (ref 0.2–1.2)
Total Protein: 6.6 g/dL (ref 6.1–8.1)
eGFR: 87 mL/min/{1.73_m2} (ref >=60)

## 2022-09-26 LAB — MICROALBUMIN / CREATININE URINE RATIO
Creatinine, Urine: 99 mg/dL (ref 20–320)
Microalb Creat Ratio: 76 mcg/mg creat — ABNORMAL HIGH (ref <30)
Microalb, Ur: 7.5 mg/dL

## 2022-09-26 LAB — URINALYSIS, ROUTINE W REFLEX MICROSCOPIC
Bacteria, UA: NONE SEEN /HPF
Bilirubin Urine: NEGATIVE
Glucose, UA: NEGATIVE
Hgb urine dipstick: NEGATIVE
Hyaline Cast: NONE SEEN /LPF
Ketones, ur: NEGATIVE
Leukocytes,Ua: NEGATIVE
Nitrite: NEGATIVE
RBC / HPF: NONE SEEN /HPF (ref 0–2)
Specific Gravity, Urine: 1.019 (ref 1.001–1.035)
Squamous Epithelial / HPF: NONE SEEN /HPF (ref <=5)
WBC, UA: NONE SEEN /HPF (ref 0–5)
pH: 5.5 (ref 5.0–8.0)

## 2022-09-26 LAB — HEMOGLOBIN A1C
Hgb A1c MFr Bld: 9.5 % of total Hgb — ABNORMAL HIGH (ref <5.7)
Mean Plasma Glucose: 226 mg/dL
eAG (mmol/L): 12.5 mmol/L

## 2022-09-26 LAB — LIPID PANEL
Cholesterol: 177 mg/dL (ref <200)
HDL: 41 mg/dL (ref >=40)
LDL Cholesterol (Calc): 104 mg/dL (calc) — ABNORMAL HIGH
Non-HDL Cholesterol (Calc): 136 mg/dL (calc) — ABNORMAL HIGH (ref <130)
Total CHOL/HDL Ratio: 4.3 (calc) (ref <5.0)
Triglycerides: 208 mg/dL — ABNORMAL HIGH (ref <150)

## 2022-09-26 LAB — MICROSCOPIC MESSAGE

## 2022-09-26 LAB — TSH: TSH: 1.86 mIU/L (ref 0.40–4.50)

## 2022-09-26 LAB — VITAMIN D 25 HYDROXY (VIT D DEFICIENCY, FRACTURES): Vit D, 25-Hydroxy: 64 ng/mL (ref 30–100)

## 2022-09-26 LAB — MAGNESIUM: Magnesium: 1.6 mg/dL (ref 1.5–2.5)

## 2022-09-26 LAB — INSULIN, RANDOM: Insulin: 15.6 u[IU]/mL

## 2022-09-26 LAB — PSA: PSA: 0.37 ng/mL (ref <=4.00)

## 2022-09-26 NOTE — Progress Notes (Signed)
<><><><><><><><><><><><><><><><><><><><><><><><><><><><><><><><><> <><><><><><><><><><><><><><><><><><><><><><><><><><><><><><><><><>  - Total Chol = 177    -    Great, But  . . . . .   - Bad   LDL Chol = 104  is    too high - ideal is less than 70  !  - recommend stricter diet & weight loss.  - Cholesterol is too high - Recommend low cholesterol diet   - Cholesterol only comes from animal sources  - ie. meat, dairy, egg yolks  - Eat all the vegetables you want.  - Avoid Meat, Avoid Meat,  Avoid Meat                                                                        - especially Red Meat - Beef AND Pork .  - Avoid cheese & dairy - milk & ice cream.     - Cheese is the most concentrated form of trans-fats which                                                                         is the worst thing to clog up our arteries.   - Veggie cheese is OK which can be found in the fresh                                    produce section at [employer] or [employer] or [employer] <><><><><><><><><><><><><><><><><><><><><><><><><><><><><><><><><> <><><><><><><><><><><><><><><><><><><><><><><><><><><><><><><><><>  - Also Triglycerides (  208 ) or fats in blood are too high                 (   Ideal or  Goal is less than 150  !  )    - Recommend avoid fried & greasy foods,  sweets / candy,   - Avoid white rice  (brown or wild rice or Quinoa is OK),   - Avoid white potatoes  (sweet potatoes are OK)   - Avoid anything made from white flour  - bagels, doughnuts, rolls, buns, biscuits, white and   wheat breads, pizza crust and traditional  pasta made of white flour & egg white  - (vegetarian pasta or spinach or wheat pasta is OK).    - Multi-grain bread is OK - like multi-grain flat bread or  sandwich thins.   - Avoid alcohol in excess.   - Exercise is also  important.'s <><><><><><><><><><><><><><><><><><><><><><><><><><><><><><><><><> <><><><><><><><><><><><><><><><><><><><><><><><><><><><><><><><><>  - A1c = 9.5% - Way too high  !     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   <><><><><><><><><><><><><><><><><><><><><><><><><><><><><><><><><> <><><><><><><><><><><><><><><><><><><><><><><><><><><><><><><><><>  -  PSA - normal  - No Prostate Cancer  - Great !  <><><><><><><><><><><><><><><><><><><><><><><><><><><><><><><><><> <><><><><><><><><><><><><><><><><><><><><><><><><><><><><><><><><>  -  Vitamin D = 64 - Great  <><><><><><><><><><><><><><><><><><><><><><><><><><><><><><><><><> <><><><><><><><><><><><><><><><><><><><><><><><><><><><><><><><><>  - All Else - CBC - Kidneys - Electrolytes - Liver - Magnesium & Thyroid    - all  Normal / OK  <><><><><><><><><><><><><><><><><><><><><><><><><><><><><><><><><> <><><><><><><><><><><><><><><><><><><><><><><><><><><><><><><><><>                                                                                                                                  <><><><><><><><><><><><><><><><><><><><><><><><><><><><><><><><><>  -

## 2022-10-02 NOTE — Progress Notes (Signed)
Patient's wife is aware of lab results and instructions and states that he has started back on his Trulicity and is working on his diet. However, he is unable to take Metformin 2 or 3 tablets a day due to it causing Diarrhea.

## 2022-10-05 ENCOUNTER — Other Ambulatory Visit: Payer: Self-pay | Admitting: Nurse Practitioner

## 2022-10-23 ENCOUNTER — Encounter: Payer: Self-pay | Admitting: Neurology

## 2022-10-23 ENCOUNTER — Ambulatory Visit: Payer: Managed Care, Other (non HMO) | Admitting: Neurology

## 2022-10-23 VITALS — BP 166/87 | HR 72 | Ht 68.0 in | Wt 200.0 lb

## 2022-10-23 DIAGNOSIS — G4719 Other hypersomnia: Secondary | ICD-10-CM

## 2022-10-23 DIAGNOSIS — Z024 Encounter for examination for driving license: Secondary | ICD-10-CM | POA: Diagnosis not present

## 2022-10-23 DIAGNOSIS — G4734 Idiopathic sleep related nonobstructive alveolar hypoventilation: Secondary | ICD-10-CM | POA: Diagnosis not present

## 2022-10-23 DIAGNOSIS — G4733 Obstructive sleep apnea (adult) (pediatric): Secondary | ICD-10-CM | POA: Diagnosis not present

## 2022-10-23 DIAGNOSIS — G4726 Circadian rhythm sleep disorder, shift work type: Secondary | ICD-10-CM | POA: Insufficient documentation

## 2022-10-23 DIAGNOSIS — E13618 Other specified diabetes mellitus with other diabetic arthropathy: Secondary | ICD-10-CM | POA: Insufficient documentation

## 2022-10-23 MED ORDER — ALPRAZOLAM 0.5 MG PO TABS: 0.5000 mg | ORAL_TABLET | Freq: Every evening | ORAL | 0 refills | Status: DC | PRN

## 2022-10-23 NOTE — Addendum Note (Signed)
Addended by: Larey Seat on: 10/23/2022 10:08 AM   Modules accepted: Orders

## 2022-10-23 NOTE — Patient Instructions (Signed)
Living With Sleep Apnea Sleep apnea is a condition in which breathing pauses or becomes shallow during sleep. Sleep apnea is most commonly caused by a collapsed or blocked airway. People with sleep apnea usually snore loudly. They may have times when they gasp and stop breathing for 10 seconds or more during sleep. This may happen many times during the night. The breaks in breathing also interrupt the deep sleep that you need to feel rested. Even if you do not completely wake up from the gaps in breathing, your sleep may not be restful and you feel tired during the day. You may also have a headache in the morning and low energy during the day, and you may feel anxious or depressed. How can sleep apnea affect me? Sleep apnea increases your chances of extreme tiredness during the day (daytime fatigue). It can also increase your risk for health conditions, such as: Heart attack. Stroke. Obesity. Type 2 diabetes. Heart failure. Irregular heartbeat. High blood pressure. If you have daytime fatigue as a result of sleep apnea, you may be more likely to: Perform poorly at school or work. Fall asleep while driving. Have difficulty with attention. Develop depression or anxiety. Have sexual dysfunction. What actions can I take to manage sleep apnea? Sleep apnea treatment  If you were given a device to open your airway while you sleep, use it only as told by your health care provider. You may be given: An oral appliance. This is a custom-made mouthpiece that shifts your lower jaw forward. A continuous positive airway pressure (CPAP) device. This device blows air through a mask when you breathe out (exhale). A nasal expiratory positive airway pressure (EPAP) device. This device has valves that you put into each nostril. A bi-level positive airway pressure (BIPAP) device. This device blows air through a mask when you breathe in (inhale) and breathe out (exhale). You may need surgery if other treatments  do not work for you. Sleep habits Go to sleep and wake up at the same time every day. This helps set your internal clock (circadian rhythm) for sleeping. If you stay up later than usual, such as on weekends, try to get up in the morning within 2 hours of your normal wake time. Try to get at least 7-9 hours of sleep each night. Stop using a computer, tablet, and mobile phone a few hours before bedtime. Do not take long naps during the day. If you nap, limit it to 30 minutes. Have a relaxing bedtime routine. Reading or listening to music may relax you and help you sleep. Use your bedroom only for sleep. Keep your television and computer out of your bedroom. Keep your bedroom cool, dark, and quiet. Use a supportive mattress and pillows. Follow your health care provider's instructions for other changes to sleep habits. Nutrition Do not eat heavy meals in the evening. Do not have caffeine in the later part of the day. The effects of caffeine can last for more than 5 hours. Follow your health care provider's or dietitian's instructions for any diet changes. Lifestyle     Do not drink alcohol before bedtime. Alcohol can cause you to fall asleep at first, but then it can cause you to wake up in the middle of the night and have trouble getting back to sleep. Do not use any products that contain nicotine or tobacco. These products include cigarettes, chewing tobacco, and vaping devices, such as e-cigarettes. If you need help quitting, ask your health care provider. Medicines Take   over-the-counter and prescription medicines only as told by your health care provider. Do not use over-the-counter sleep medicine. You can become dependent on this medicine, and it can make sleep apnea worse. Do not use medicines, such as sedatives and narcotics, unless told by your health care provider. Activity Exercise on most days, but avoid exercising in the evening. Exercising near bedtime can interfere with  sleeping. If possible, spend time outside every day. Natural light helps regulate your circadian rhythm. General information Lose weight if you need to, and maintain a healthy weight. Keep all follow-up visits. This is important. If you are having surgery, make sure to tell your health care provider that you have sleep apnea. You may need to bring your device with you. Where to find more information Learn more about sleep apnea and daytime fatigue from: American Sleep Association: sleepassociation.org National Sleep Foundation: sleepfoundation.org National Heart, Lung, and Blood Institute: nhlbi.nih.gov Summary Sleep apnea is a condition in which breathing pauses or becomes shallow during sleep. Sleep apnea can cause daytime fatigue and other serious health conditions. You may need to wear a device while sleeping to help keep your airway open. If you are having surgery, make sure to tell your health care provider that you have sleep apnea. You may need to bring your device with you. Making changes to sleep habits, diet, lifestyle, and activity can help you manage sleep apnea. This information is not intended to replace advice given to you by your health care provider. Make sure you discuss any questions you have with your health care provider. Document Revised: 02/16/2021 Document Reviewed: 06/18/2020 Elsevier Patient Education  2023 [employer]  

## 2022-10-23 NOTE — Progress Notes (Addendum)
SLEEP MEDICINE CLINIC    Provider:  Larey Seat, MD  Primary Care Physician:  Unk Pinto, MD [address] [place] [address]     Referring Provider: Unk Pinto, [place],  [place] [address]          Chief Complaint according to patient   Patient presents with:     New Patient (Initial Visit)           HISTORY OF PRESENT ILLNESS:    Phillip Powell is a 77 y.o. male patient who is seen upon referral on 10/23/2022 from Dr Melford Aase, MD ,  for a Sleep consultation. 10-23-2022. Phillip Powell  Chief concern according to patient :  Patient had his first and last PSG/ HST in  12/ 2015 and received a CPAP in 07-2024, at [facility] settings. Auto CPAP.     Phillip Powell is seen as a NEW PATIENT CONSULT on 10/23/22 , here with his wife ,a right-handed male with OSA. His baseline study at [facility] was 9 12-20. AHI 11/h at baseline then and with 405 minutes in hypoxia ! . He was titrated by auto machine, didn't return to the lab.    Sleep relevant medical history: history of sarcoidosis, Dr. Annamaria Boots. No Nocturia, 1-2, no Sleep walking, had a Tonsillectomy, deviated septum, biological teeth.  coughing with allergies. Severe COVID 2020 , in hospital.   Family medical Phillip Powell history: No other family member on CPAP with OSA.    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  Tobacco use: none .  ETOH use ; socially , seldomly ,  Caffeine intake in form of Coffee( /) Soda( 1 a day - on the road ) Tea ( /) or energy drinks       Sleep habits are as follows: The patient's dinner time is between 4 PM. The patient goes to work between 8 PM and 8 AM  and in daytime  continues to sleep for 5 -6  hours. The preferred sleep position is supine due to shoulder pain- , with the support of 1 pillows.   Dreams are reportedly rare.  The patient wakes up spontaneously. 3 Pm  is the usual rise time. He reports not feeling refreshed or restored in AM, with symptoms such as dry mouth, morning headaches, and residual fatigue.  Naps are taken frequently, after every meal there is a sedentary phase- lasting from 30 to 40 minutes and are more refreshing than daytime  sleep.    Review of Systems: Out of a complete 14 system review, the patient complains of only the following symptoms, and all other reviewed systems are negative.:  Fatigue, sleepiness , snoring, fragmented sleep,  Neuropathy-    How likely are you to doze in the following situations: 0 = not likely, 1 = slight chance, 2 = moderate chance, 3 = high chance   Sitting and Reading? Watching Television? Sitting inactive in a public place (theater or meeting)? As a passenger in a car for an hour without a break? Lying down in the afternoon when circumstances permit? Sitting and talking to someone? Sitting quietly after lunch without alcohol? In a car, while stopped for a few minutes in traffic?   Total = 12/ 24 points   FSS endorsed at 36/ 63 points.  [employer], shift worker.   Social History   Socioeconomic History   Marital status: Married    Spouse name: Not on file   Number of children: Not on file   Years of education: Not on file   Highest education level: Not on file  Occupational History   Not on file  Tobacco Use   Smoking status: Never   Smokeless tobacco: Never  Vaping Use   Vaping Use: Never used  Substance and Sexual Activity   Alcohol use: Yes    Comment: very rarely   Drug use: Never   Sexual activity: Not on file  Other Topics Concern   Not on file  Social History Narrative   Not on file   Social Determinants of Health   Financial Resource Strain: Not on file  Food Insecurity: Not on file  Transportation Needs: Not on file  Physical Activity: Not on file  Stress: Not on file  Social Connections:  Not on file    Family History  Problem Relation Age of Onset   Thyroid disease Father    Cancer Father        lung   Cancer Mother        throat   Diabetes Mother    Thyroid disease Sister    Hypertension Son    Hyperlipidemia Son    Cancer Paternal Grandmother        colon    Past Medical History:  Diagnosis Date    Covid-19 Positive (02/06/2019)  02/11/2019   Arthritis    BPH (benign prostatic hypertrophy)    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    GERD (gastroesophageal reflux disease)    Hepatic steatosis    History of kidney stones    Hydroureteronephrosis    Hypertension    Hypogonadism male    Mass    left shoulder AC joint   OSA (obstructive sleep apnea)    wears CPAP wears 4 hours per nite    PONV (postoperative nausea and vomiting)    Renal calculi    Sarcoidosis    Vitamin D deficiency     Past Surgical History:  Procedure Laterality Date   APPENDECTOMY     COLONOSCOPY     CYSTOSCOPY/URETEROSCOPY/HOLMIUM LASER/STENT PLACEMENT Right 10/01/2019   Procedure: CYSTOSCOPY/RETROGRADE/URETEROSCOPY/HOLMIUM LASER/STENT PLACEMENT;  Surgeon: Ceasar Mons, MD;  Location: [facility];  Service: Urology;  Laterality: Right;  ONLY NEEDS 45 MIN   HAND SURGERY Right 2019   Dr. Amedeo Plenty, tendon repair of R thumb   HERNIA REPAIR  [id number]   umbilical   KNEE ARTHROSCOPY     ORIF CLAVICULAR FRACTURE Left 03/29/2018   Procedure: LEFT SHOULDER OPEN DISTAL CLAVICLE RESECTION, CYST EXCISION;  Surgeon: Netta Cedars, MD;  Location: [facility];  Service: Orthopedics;  Laterality: Left;   ROTATOR CUFF REPAIR Left 1996   ROTATOR CUFF REPAIR Right [facility]   TOTAL KNEE ARTHROPLASTY Left 11/22/2020   Procedure: TOTAL KNEE ARTHROPLASTY;  Surgeon: Gaynelle Arabian, MD;  Location: [facility];  Service: Orthopedics;  Laterality: Left;     Current Outpatient Medications on File Prior to Visit  Medication Sig Dispense Refill   acetaminophen (TYLENOL) 500 MG tablet  Take 1,000 mg by mouth every 6 (six) hours as needed for moderate pain.     amLODipine (NORVASC) 10 MG tablet TAKE 1/2 TO 1 TABLET BY MOUTH DAILY FOR BLOOD PRESSURE 90 tablet 1   atenolol (TENORMIN) 100 MG tablet Take 1  tablet (100 mg total) by mouth daily. for blood pressure 90 tablet 1   B-D 3CC LUER-LOK SYR 21GX1" 21G X 1" 3 ML MISC use as directed 12 each 1   blood glucose meter kit and supplies KIT Dispense based on  insurance coverage. Test blood sugar twice daily. #100 lancet and strips with 3 refills 1 each 0   Blood Glucose Monitoring Suppl (ONETOUCH VERIO) w/Device KIT Test blood sugar once daily. 1 kit 0   Blood Glucose Monitoring Suppl DEVI Use meter three times a day to check blood sugar 1 each 0   Cholecalciferol (VITAMIN D3) 1.25 MG (50000 UT) TABS Take by mouth.     Dulaglutide (TRULICITY) A999333 0000000 SOPN Inject 0.75 mg into the skin once a week. 2 mL 3   famotidine (PEPCID) 40 MG tablet TAKE 1 TABLET BY MOUTH AT BEDTIME FOR INDIGESTION OR ACID REFLUX 90 tablet 3   glucose blood (ONETOUCH VERIO) test strip Test sugars 3 x a day for hypoglycemia/hyperglycemia and fluctuating sugars 100 each 12   glyBURIDE (DIABETA) 5 MG tablet Take 1/2 to 1 tablet  3 x /day  with Meals for Diabetes 270 tablet 0   hydrochlorothiazide (HYDRODIURIL) 25 MG tablet Take 1 tablet (25 mg total) by mouth daily. 30 tablet 3   hyoscyamine (LEVSIN) 0.125 MG tablet Take  1 to 2 tablets  3 to 4 x /day  every 4 to 6 hours as needed for Nausia, Cramping, Bloating or Diarrhea 120 tablet 0   Lancets (ONETOUCH ULTRASOFT) lancets Test blood sugar once daily 100 each 12   leflunomide (ARAVA) 10 MG tablet Take 10 mg by mouth daily.     Loperamide HCl (IMODIUM PO) Take by mouth.     meclizine (ANTIVERT) 25 MG tablet Take 25 mg by mouth 3 (three) times daily as needed for dizziness.     metFORMIN (GLUCOPHAGE-XR) 500 MG 24 hr tablet TAKE 1 TO 2 TABLETS BY MOUTH TWICE DAILY WITH MEALS FOR DIABETES 360 tablet 3    ondansetron (ZOFRAN) 8 MG tablet Take 1/2 to 1 tablet 3 x day if needed for Nausea 30 tablet 1   tamsulosin (FLOMAX) 0.4 MG CAPS capsule TAKE 1 CAPSULE BY MOUTH AT BEDTIME FOR PROSTATE 90 capsule 3   Zinc 50 MG TABS Take by mouth.     No current facility-administered medications on file prior to visit.    Allergies  Allergen Reactions   Ace Inhibitors Other (See Comments)    Unknown   Ibuprofen Other (See Comments)    GI upset   Invokana [Canagliflozin] Other (See Comments)    Unknown   Quinidine Other (See Comments)    Unknown     DIAGNOSTIC DATA (LABS, IMAGING, TESTING) - I reviewed patient records, labs, notes, testing and imaging myself where available.  Lab Results  Component Value Date   WBC 6.9 09/25/2022   HGB 14.2 09/25/2022   HCT 44.2 09/25/2022   MCV 80.1 09/25/2022   PLT 215 09/25/2022      Component Value Date/Time   NA 140 09/25/2022 1052   K 3.8 09/25/2022 1052   CL 98 09/25/2022 1052   CO2 30 09/25/2022 1052   GLUCOSE 194 (H) 09/25/2022 1052   BUN 19 09/25/2022 1052   CREATININE 0.92 09/25/2022 1052   CALCIUM 9.4 09/25/2022 1052   PROT 6.6 09/25/2022 1052   ALBUMIN 3.3 (L) 11/22/2020 1020   AST 11 09/25/2022 1052   ALT 10 09/25/2022 1052   ALKPHOS 58 11/22/2020  1020   BILITOT 0.4 09/25/2022 1052   GFRNONAA >60 11/23/2020 0307   GFRNONAA 75 10/18/2020 0914   GFRAA 87 10/18/2020 0914   Lab Results  Component Value Date   CHOL 177 09/25/2022   HDL 41 09/25/2022   LDLCALC 104 (H) 09/25/2022   TRIG 208 (H) 09/25/2022   CHOLHDL 4.3 09/25/2022   Lab Results  Component Value Date   HGBA1C 9.5 (H) 09/25/2022   Lab Results  Component Value Date   [id number] 02/23/2020   Lab Results  Component Value Date   TSH 1.86 09/25/2022    PHYSICAL EXAM:  Today's Vitals   10/23/22 0826  BP: (!) 166/87  Pulse: 72  Weight: 200 lb (90.7 kg)  Height: 5\' 8"  (1.727 m)   Body mass index is 30.41 kg/m.   Wt Readings from Last 3 Encounters:   10/23/22 200 lb (90.7 kg)  09/25/22 212 lb (96.2 kg)  06/05/22 220 lb 6.4 oz (100 kg)     Ht Readings from Last 3 Encounters:  10/23/22 5\' 8"  (1.727 m)  09/25/22 5' 7.5" (1.715 m)  06/05/22 5' 7.5" (1.715 m)      General: The patient is awake, alert and appears not in acute distress. The patient is well groomed. Head: Normocephalic, atraumatic. Neck is supple.  Mallampati  3 plus ,  neck circumference:17. 75 inches .  Nasal airflow  patent.  Retrognathia is not seen.  Dental status:  biology Cardiovascular:  Regular rate and cardiac rhythm by pulse,  without distended neck veins. Respiratory: Lungs are clear to auscultation.  Skin:  Without evidence of ankle edema, or rash.facial redness.  Trunk: The patient's posture is slightly stooped.  NEUROLOGIC EXAM: The patient is awake and alert, oriented to place and time.   Memory subjective described as intact.  Attention span & concentration ability appears normal.  Speech is fluent,  without  dysarthria, dysphonia or aphasia.  Mood and affect are appropriate.   Cranial nerves: no loss of smell or taste reported  Pupils are equal and briskly reactive to light. Funduscopic exam deferred. .  Extraocular movements in vertical and horizontal planes were intact and without nystagmus. No Diplopia. Visual fields by finger perimetry are intact. Hearing was intact to soft voice and finger rubbing.    Facial sensation intact to fine touch.  Facial motor strength is symmetric and tongue and uvula move midline.  Neck ROM : rotation, tilt and flexion extension were normal for age and shoulder shrug was symmetrical.    Motor exam:  Symmetric bulk, tone and ROM.   Normal tone without cog wheeling, symmetric grip strength .   Sensory:  Fine touch and vibration were reduced in both feet and ankles.  Proprioception tested in the upper extremities was normal.   Coordination: Rapid alternating movements in the fingers/hands were of normal speed.   The Finger-to-nose maneuver was intact without evidence of ataxia, dysmetria or tremor.   Gait and station: Patient could rise unassisted from a seated position, walked without assistive device.  Stance is of normal width/ base and the patient turned with 3-4 steps.  Toe and heel walk were deferred.  Deep tendon reflexes: in the  upper and lower extremities are symmetric and intact.  Babinski response was deferred.    ASSESSMENT AND PLAN 77 y.o. year old male  here with:    1)  OSA diagnosis was mild - AHI 11/h.  What concerns me about his original sleep study at [facility]  in December 2015 is that he had a low AHI but an oxygen nadir at 82% with 405 minutes of oxygen desaturation.  He is left prolonged at night under low oxygen levels.  And he did have the indication to start on CPAP and Dr. Marcha Dutton states that possibly supplemental oxygen was recommended but it was never retested- he was never tested according to his memory for any oxygen need.  There was no titration on CPAP he was absolutely left to an auto-titration device as factory settings.    There was no overnight pulse oximetry following this.  He is a compliant CPAP user with 100% compliance for the 30 days preceding 22 October 2018 for an average of 6 hours and 14 minutes each night the AutoSet is between 4 and 20 cm water heated humidification 3 cm expiratory pressure relief and this patient has still 5.5 obstructive sleep apneas per hour and 4.3 hypopneas per hour with a residual AHI total of 10.9/h this is basically the same apnea index he had at baseline before being treated.  In the meantime he has certainly lost weight, at PSG  his BMI was 37.  Plan: I need to repeat his sleep study and would much rather titrate him in lab to CPAP or BiPAP and fit him one on one for a ask, as well as seeing to change to BiPAP.   I SPLIT at AHI 10/h and refit for mask, and for CPAP versus BiPAP and possible need for Oxygen. He got 2  pills of xanax for sleep aid in the labs ordered. He uses a RESMED F10 FFM in large.  95% pressure is 13.4 cm water.  [employer] is his DME .   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 I reviewed the labs form 09-25-2022.   I plan to follow up either personally or through our NP within 2-3  months.   I would like to thank Unk Pinto, MD and Unk Pinto, [place] [place],  [place] [address] for allowing me to meet with and to take care of this pleasant patient.   CC: I will share my notes with PCP .  After spending a total time of  45  minutes face to face and additional time for physical and neurologic examination, review of laboratory studies,  personal review of imaging studies, reports and results of other testing and review of referral information / records as far as provided in visit,   Electronically signed by: Larey Seat, MD 10/23/2022 9:25 AM  [facility] certified by The [employer] of Sleep Medicine and Diplomate of the [employer] of Sleep Medicine. Board certified In Neurology through the [employer], Fellow of the [employer] of Neurology. Medical Director of [employer].

## 2022-11-01 ENCOUNTER — Telehealth: Payer: Self-pay

## 2022-11-01 NOTE — Telephone Encounter (Signed)
We received a call from Phillip Powell wife. She was calling to check in on the status of his sleep study. States it was ordered 4/1 but they have not heard anything.  When you get a chance, can you please give her a call at [phone] or [phone]

## 2022-11-02 ENCOUNTER — Other Ambulatory Visit: Payer: Self-pay | Admitting: Nurse Practitioner

## 2022-11-02 DIAGNOSIS — I10 Essential (primary) hypertension: Secondary | ICD-10-CM

## 2022-11-02 NOTE — Telephone Encounter (Signed)
[employer] pending faxed notes  

## 2022-11-07 NOTE — Telephone Encounter (Signed)
Spoke with the patient Phillip Powell.  Split- [employer] Berkley HarveyAdron Bene (exp. 11/02/22 to 02/04/23)- pt can only do Sundays   Patient is scheduled at [facility] for 01/14/23 at 9 pm.  Mailed packet to the patient.

## 2022-11-11 ENCOUNTER — Other Ambulatory Visit: Payer: Self-pay | Admitting: Internal Medicine

## 2022-11-11 DIAGNOSIS — I10 Essential (primary) hypertension: Secondary | ICD-10-CM

## 2022-11-15 ENCOUNTER — Ambulatory Visit: Payer: Managed Care, Other (non HMO) | Admitting: Nurse Practitioner

## 2022-11-30 ENCOUNTER — Other Ambulatory Visit: Payer: Self-pay | Admitting: Nurse Practitioner

## 2022-11-30 DIAGNOSIS — E1122 Type 2 diabetes mellitus with diabetic chronic kidney disease: Secondary | ICD-10-CM

## 2022-12-28 NOTE — Progress Notes (Signed)
FOLLOW UP 3 MONTH   Assessment and Plan:  Essential hypertension Change the way you take your medications: Rx Olmesartan 40mg  every morning and Atenolol 100mg  every night/bedtime.  Monitor blood pressure DECREASE SODIUM intake Monitor blood pressure at home; call if consistently over 130/80 Continue DASH diet.   Reminder to go to the ER if any CP, SOB, nausea, dizziness, severe HA, changes vision/speech, left arm numbness and tingling and jaw pain.   Type 2 diabetes mellitus with stage 2 chronic kidney disease, without long-term current use of insulin (HCC) Trulicity 0.75 mg QW and Metformin 500 mg QD- could not tolerate higher doses of either due to GI side effects Taking Glyburide 4mg  in am if FBS greater than 150. MUST CHECK BLOOD SUGAR at least BID -     Hemoglobin A1c Discussed general issues about diabetes pathophysiology and management., Educational material distributed., Suggested low cholesterol diet., Encouraged aerobic exercise., Discussed foot care., Reminded to get yearly retinal exam.  CKD stage 2 due to type 2 diabetes mellitus (HCC) -     CBC with Differential/Platelet -     COMPLETE METABOLIC PANEL WITH GFR -     Hemoglobin A1c Stressed importance of diet and exercise, push fluids   Hyperlipidemia associated with type 2 diabetes mellitus (HCC) Life style controlled, discussed statin Discussed dietary and exercise modifications Low fat diet -     Lipid panel  Aortic atherosclerosis (HCC) by Abd CT scan ion 09/19/2019 Control blood pressure, lipids and glucose Disscused lifestyle modifications, diet & exercise Continue to monitor   OSA (obstructive sleep apnea) Continue CPAP/BiPAP, using nightly for at least 8 hours  Has repeat sleep study scheduled for 01/14/23  Type 2 Diabetes Mellitus with Obesity (hcc) Long discussion about weight loss, diet, and exercise Recommended diet heavy in fruits and veggies and low in animal meats, cheeses, and dairy products,  appropriate calorie intake Patient will work on decreasing saturated fats and simple carbs. Increase lean proteins and exercise Follow up at next visit   Gastroesophageal reflux disease, unspecified whether esophagitis present Doing well at this time Continue: Famotidine Diet discussed Monitor for triggers Avoid food with high acid content Avoid excessive cafeine Increase water intake  Diarrhea Worse after large meals Advised to decrease portion size and limit fat content Continue Immodium as needed   Medication management Continued   Over 30 minutes of face to face exam, counseling, chart review, and complex, high/moderate level critical decision making was performed this visit.   Future Appointments  Date Time Provider Department Center  01/14/2023  9:00 PM [facility] None  10/15/2023 11:00 AM Lucky Cowboy, MD [facility] None     HPI 77 y.o. obese male with history of DM2, CKD improved, gout, HLD, HTN, OSA, kidney stone s/p right ureteroscopy, laser lithotripsy and ureteral stent on 03/10 with Dr. Liliane Shi, reactive OA with elevated CRP/ESR, following with Dr. Deanne Coffer, here for 3 month follow up.   His last OV was 06/2020 for CPE. He is a Naval architect with CDL. Sleeps during the day and drives at night. Complains of feeling tired all the time  He is due to have right shoulder replacement.   He has been having diarrhea after eating and upset stomach.  Her is established with Dr. Loreta Ave. He will get mostly after a large meal He was initially questioning Trulicity as the cause so went from Trulicity 3 mg to 1.5 but symptoms persist. He is currently on Trulicity 0.75 mg  He has been  following with Dr Despina Hick for Osteoarthritis of bilateral knee joints.  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  Knee pain has improved with good range of motion. He is now having right knee pain and is going to be having injections. May potentially need right knee replacement.    He is currently on  Atenolol 100 mg and Amlodipine 10mg  1/2 tab daily.  BP's have been running in normal range.  BP Readings from Last 3 Encounters:  01/01/23 132/68  10/23/22 (!) 166/87  09/25/22 134/86    Patient had migratory arthralgias with elevated CRP/ESR, following with Dr. Marcos Eke. Currenlty on Leflunomide 10 mg daily- will monitor liver function.      BMI is Body mass index is 32.22 kg/m., he is working on diet and exercise. Has not been watching his diet as much. Exercise is not as much. Wt Readings from Last 3 Encounters:  01/01/23 208 lb 12.8 oz (94.7 kg)  10/23/22 200 lb (90.7 kg)  09/25/22 212 lb (96.2 kg)    The patient is diabetic and has his CDL. Is doing another sleep study worth Dr. Vickey Huger  He has hyperlipidemia, diet controlled He has CKD which improved with kidney stone removal and adjustment of medications.  He is on Trulicity 1.5mg  once a week, Metformin 500mg  1 tab daily can not tolerate more due to loose stools frequent and urgency associated. Tried to increase to 3 mg daily but having a lot of loose stools and stomach upset. Is currently on Trulicity 0.75 mg QW He is taking glyburide 5 mg a pill in AM if BS over 150, recently has had to use due to elevated blood sugars from steroid use. Patient does have hypoglycemia awareness.  He has been running 120-140 fasting   He is on Atenolol 100mg  and amlodipine 10 mg 1 tab daily and flomax.  Wearing CPAP at night for OSA.  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  Last result was not to goal.  Lab Results  Component Value Date   VD25OH 64 09/25/2022       Current Outpatient Medications (Endocrine & Metabolic):    glyBURIDE (DIABETA) 5 MG tablet, Take 1/2 to 1 tablet  3 x /day  with Meals for Diabetes   metFORMIN (GLUCOPHAGE-XR) 500 MG 24 hr tablet, TAKE 1  TO 2 TABLETS BY MOUTH TWICE DAILY WITH MEALS FOR DIABETES   TRULICITY 0.75 MG/0.5ML SOPN, ADMINISTER 0.75 MG UNDER THE SKIN 1 TIME A WEEK  Current Outpatient Medications (Cardiovascular):    amLODipine (NORVASC) 10 MG tablet, TAKE 1/2 TO 1 TABLET BY MOUTH DAILY FOR BLOOD PRESSURE   atenolol (TENORMIN) 100 MG tablet, Take  1 tablet  Daily for BP                                                                                                          /  TAKE                                                                  BY                                                   MOUTH   hydrochlorothiazide (HYDRODIURIL) 25 MG tablet, TAKE 1 TABLET(25 MG) BY MOUTH DAILY   Current Outpatient Medications (Analgesics):    acetaminophen (TYLENOL) 500 MG tablet, Take 1,000 mg by mouth every 6 (six) hours as needed for moderate pain.   leflunomide (ARAVA) 10 MG tablet, Take 10 mg by mouth daily.   Current Outpatient Medications (Other):    B-D 3CC LUER-LOK SYR 21GX1" 21G X 1" 3 ML MISC, use as directed   blood glucose meter kit and supplies KIT, Dispense based on  insurance coverage. Test blood sugar twice daily. #100 lancet and strips with 3 refills   Blood Glucose Monitoring Suppl (ONETOUCH VERIO) w/Device KIT, Test blood sugar once daily.   Blood Glucose Monitoring Suppl DEVI, Use meter three times a day to check blood sugar   Cholecalciferol (VITAMIN D3) 1.25 MG (50000 UT) TABS, Take by mouth.   famotidine (PEPCID) 40 MG tablet, TAKE 1 TABLET BY MOUTH AT BEDTIME FOR INDIGESTION OR ACID REFLUX   glucose blood (ONETOUCH VERIO) test strip, Test sugars 3 x a day for hypoglycemia/hyperglycemia and fluctuating sugars   hyoscyamine (LEVSIN) 0.125 MG tablet, Take  1 to 2 tablets  3 to 4 x /day  every 4 to 6 hours as needed for Nausia, Cramping, Bloating or Diarrhea   Lancets (ONETOUCH ULTRASOFT) lancets, Test blood sugar once  daily   Loperamide HCl (IMODIUM PO), Take by mouth.   meclizine (ANTIVERT) 25 MG tablet, Take 25 mg by mouth 3 (three) times daily as needed for dizziness.   ondansetron (ZOFRAN) 8 MG tablet, Take 1/2 to 1 tablet 3 x day if needed for Nausea   tamsulosin (FLOMAX) 0.4 MG CAPS capsule, TAKE 1 CAPSULE BY MOUTH AT BEDTIME FOR PROSTATE   Zinc 50 MG TABS, Take by mouth.   ALPRAZolam (XANAX) 0.5 MG tablet, Take 1 tablet (0.5 mg total) by mouth at bedtime as needed for anxiety. In sleep lab (Patient not taking: Reported on 01/01/2023)  Past Medical History:  Diagnosis Date    Covid-19 Positive (02/06/2019)  02/11/2019   Arthritis    BPH (benign prostatic hypertrophy)    Diabetes mellitus without complication (HCC)    type w    GERD (gastroesophageal reflux disease)    Hepatic steatosis    History of kidney stones    Hydroureteronephrosis    Hypertension    Hypogonadism male    Mass    left shoulder AC joint   OSA (obstructive sleep apnea)    wears CPAP wears 4 hours per nite    PONV (postoperative nausea and vomiting)    Renal calculi    Sarcoidosis    Vitamin D deficiency      Allergies  Allergen Reactions   Ace Inhibitors Other (See Comments)    Unknown   Ibuprofen Other (See  Comments)    GI upset   Invokana [Canagliflozin] Other (See Comments)    Unknown   Quinidine Other (See Comments)    Unknown      Review of Systems  Constitutional:  Negative for chills, fever and weight loss.  HENT:  Negative for congestion and hearing loss.   Eyes:  Negative for blurred vision and double vision.  Respiratory:  Negative for cough and shortness of breath.   Cardiovascular:  Negative for chest pain, palpitations, orthopnea and leg swelling.  Gastrointestinal:  Positive for diarrhea. Negative for abdominal pain, constipation, heartburn, nausea and vomiting.  Musculoskeletal:  Positive for joint pain (right knee, right shoulder). Negative for falls and myalgias.  Skin:  Negative for  rash.  Neurological:  Negative for dizziness, tingling, tremors, loss of consciousness and headaches.  Psychiatric/Behavioral:  Negative for depression, memory loss and suicidal ideas.      Physical Exam: Filed Weights   01/01/23 0914  Weight: 208 lb 12.8 oz (94.7 kg)     BP 132/68   Pulse 73   Temp (!) 97.5 F (36.4 C)   Ht 5' 7.5" (1.715 m)   Wt 208 lb 12.8 oz (94.7 kg)   SpO2 95%   BMI 32.22 kg/m    General Appearance: Well nourished, obese male in no apparent distress, appears uncomfortable. Eyes: PERRLA, conjunctiva no swelling or erythema ENT/Mouth: Ext aud canals clear, TMs without erythema, bulging. Mask in place; oral exam. Hearing normal.   Neck: Supple, thyroid normal.  Respiratory: Respiratory effort normal, BS equal bilaterally, decreased breath sounds without rales, rhonchi, wheezing or stridor.  Cardio: RRR with no MRGs. Brisk peripheral pulses with 1+ edema bilateral legs with stasis dermatitis.  Abdomen: Soft, obese + BS.  Non tender, no guarding, rebound, hernias, masses. Lymphatics: Non tender without lymphadenopathy.  Musculoskeletal:  Gait is antalgic . Decreased strength of right arm with limited ROM. Right knee decreased ROM. Skin: Warm, dry .  Neuro: Normal muscle tone, Sensation intact.  Psych: Awake and oriented X 3, normal affect, Insight and Judgment appropriate.        Revonda Humphrey ANP-C  [facility]  01/01/2023

## 2023-01-01 ENCOUNTER — Encounter: Payer: Self-pay | Admitting: Nurse Practitioner

## 2023-01-01 ENCOUNTER — Ambulatory Visit: Payer: Managed Care, Other (non HMO) | Admitting: Nurse Practitioner

## 2023-01-01 VITALS — BP 132/68 | HR 73 | Temp 97.5°F | Ht 67.5 in | Wt 208.8 lb

## 2023-01-01 DIAGNOSIS — E559 Vitamin D deficiency, unspecified: Secondary | ICD-10-CM

## 2023-01-01 DIAGNOSIS — E669 Obesity, unspecified: Secondary | ICD-10-CM

## 2023-01-01 DIAGNOSIS — I10 Essential (primary) hypertension: Secondary | ICD-10-CM

## 2023-01-01 DIAGNOSIS — Z0001 Encounter for general adult medical examination with abnormal findings: Secondary | ICD-10-CM

## 2023-01-01 DIAGNOSIS — K2100 Gastro-esophageal reflux disease with esophagitis, without bleeding: Secondary | ICD-10-CM

## 2023-01-01 DIAGNOSIS — R197 Diarrhea, unspecified: Secondary | ICD-10-CM

## 2023-01-01 DIAGNOSIS — I700 Atherosclerosis of aorta: Secondary | ICD-10-CM

## 2023-01-01 DIAGNOSIS — G4733 Obstructive sleep apnea (adult) (pediatric): Secondary | ICD-10-CM

## 2023-01-01 DIAGNOSIS — E785 Hyperlipidemia, unspecified: Secondary | ICD-10-CM | POA: Diagnosis not present

## 2023-01-01 DIAGNOSIS — E1141 Type 2 diabetes mellitus with diabetic mononeuropathy: Secondary | ICD-10-CM

## 2023-01-01 DIAGNOSIS — K760 Fatty (change of) liver, not elsewhere classified: Secondary | ICD-10-CM

## 2023-01-01 DIAGNOSIS — Z79899 Other long term (current) drug therapy: Secondary | ICD-10-CM

## 2023-01-01 DIAGNOSIS — N182 Chronic kidney disease, stage 2 (mild): Secondary | ICD-10-CM

## 2023-01-01 DIAGNOSIS — E1169 Type 2 diabetes mellitus with other specified complication: Secondary | ICD-10-CM | POA: Diagnosis not present

## 2023-01-01 DIAGNOSIS — N138 Other obstructive and reflux uropathy: Secondary | ICD-10-CM

## 2023-01-01 DIAGNOSIS — E1122 Type 2 diabetes mellitus with diabetic chronic kidney disease: Secondary | ICD-10-CM

## 2023-01-01 NOTE — Patient Instructions (Signed)
Diarrhea, Adult Diarrhea is when you pass loose and sometimes watery poop (stool) often. Diarrhea can make you feel weak and cause you to lose water in your body (get dehydrated). Losing water in your body can cause you to: Feel tired and thirsty. Have a dry mouth. Go pee (urinate) less often. Diarrhea often lasts 2-3 days. It can last longer if it is a sign of something more serious. Be sure to treat your diarrhea as told by your doctor. Follow these instructions at home: Eating and drinking     Follow these instructions as told by your doctor: Take an ORS (oral rehydration solution). This is a drink that helps you replace fluids and minerals your body lost. It is sold at pharmacies and stores. Drink enough fluid to keep your pee (urine) pale yellow. Drink fluids such as: Water. You can also get fluids by sucking on ice chips. Diluted fruit juice. Low-calorie sports drinks. Milk. Avoid drinking fluids that have a lot of sugar or caffeine in them. These include soda, energy drinks, and regular sports drinks. Avoid alcohol. Eat bland, easy-to-digest foods in small amounts as you are able. These foods include: Bananas. Applesauce. Rice. Low-fat (lean) meats. Toast. Crackers. Avoid spicy or fatty foods.  Medicines Take over-the-counter and prescription medicines only as told by your doctor. If you were prescribed antibiotics, take them as told by your doctor. Do not stop taking them even if you start to feel better. General instructions  Wash your hands often using soap and water for 20 seconds. If soap and water are not available, use hand sanitizer. Others in your home should wash their hands as well. Wash your hands: After using the toilet or changing a diaper. Before preparing, cooking, or serving food. While caring for a sick person. While visiting someone in a hospital. Rest at home while you get better. Take a warm bath to help with any burning or pain from having  diarrhea. Watch your condition for any changes. Contact a doctor if: You have a fever. Your diarrhea gets worse. You have new symptoms. You vomit every time you eat or drink. You feel light-headed, dizzy, or you have a headache. You have muscle cramps. You have signs of losing too much water in your body, such as: Dark pee, very little pee, or no pee. Cracked lips. Dry mouth. Sunken eyes. Sleepiness. Weakness. You have bloody or black poop or poop that looks like tar. You have very bad pain, cramping, or bloating in your belly (abdomen). Your skin feels cold and clammy. You feel confused. Get help right away if: You have chest pain. Your heart is beating very quickly. You have trouble breathing or you are breathing very quickly. You feel very weak or you faint. These symptoms may be an emergency. Get help right away. Call 911. Do not wait to see if the symptoms will go away. Do not drive yourself to the hospital. This information is not intended to replace advice given to you by your health care provider. Make sure you discuss any questions you have with your health care provider. Document Revised: 12/27/2021 Document Reviewed: 12/27/2021 [employer] Patient Education  2024 [employer]  

## 2023-01-02 ENCOUNTER — Encounter: Payer: Self-pay | Admitting: Internal Medicine

## 2023-01-02 LAB — COMPLETE METABOLIC PANEL WITH GFR
AG Ratio: 1.8 (calc) (ref 1.0–2.5)
ALT: 9 U/L (ref 9–46)
AST: 10 U/L (ref 10–35)
Albumin: 4.2 g/dL (ref 3.6–5.1)
Alkaline phosphatase (APISO): 70 U/L (ref 35–144)
BUN: 20 mg/dL (ref 7–25)
CO2: 33 mmol/L — ABNORMAL HIGH (ref 20–32)
Calcium: 10 mg/dL (ref 8.6–10.3)
Chloride: 97 mmol/L — ABNORMAL LOW (ref 98–110)
Creat: 0.96 mg/dL (ref 0.70–1.28)
Globulin: 2.4 g/dL (calc) (ref 1.9–3.7)
Glucose, Bld: 214 mg/dL — ABNORMAL HIGH (ref 65–99)
Potassium: 4.5 mmol/L (ref 3.5–5.3)
Sodium: 139 mmol/L (ref 135–146)
Total Bilirubin: 0.6 mg/dL (ref 0.2–1.2)
Total Protein: 6.6 g/dL (ref 6.1–8.1)
eGFR: 82 mL/min/{1.73_m2} (ref >=60)

## 2023-01-02 LAB — CBC WITH DIFFERENTIAL/PLATELET
Absolute Monocytes: 482 cells/uL (ref 200–950)
Basophils Absolute: 80 cells/uL (ref 0–200)
Basophils Relative: 1.1 %
Eosinophils Absolute: 190 cells/uL (ref 15–500)
Eosinophils Relative: 2.6 %
HCT: 44.3 % (ref 38.5–50.0)
Hemoglobin: 14.3 g/dL (ref 13.2–17.1)
Lymphs Abs: 1956 cells/uL (ref 850–3900)
MCH: 25.9 pg — ABNORMAL LOW (ref 27.0–33.0)
MCHC: 32.3 g/dL (ref 32.0–36.0)
MCV: 80.1 fL (ref 80.0–100.0)
MPV: 9.4 fL (ref 7.5–12.5)
Monocytes Relative: 6.6 %
Neutro Abs: 4592 cells/uL (ref 1500–7800)
Neutrophils Relative %: 62.9 %
Platelets: 243 10*3/uL (ref 140–400)
RBC: 5.53 10*6/uL (ref 4.20–5.80)
RDW: 14.1 % (ref 11.0–15.0)
Total Lymphocyte: 26.8 %
WBC: 7.3 10*3/uL (ref 3.8–10.8)

## 2023-01-02 LAB — LIPID PANEL
Cholesterol: 176 mg/dL (ref <200)
HDL: 43 mg/dL (ref >=40)
LDL Cholesterol (Calc): 99 mg/dL (calc)
Non-HDL Cholesterol (Calc): 133 mg/dL (calc) — ABNORMAL HIGH (ref <130)
Total CHOL/HDL Ratio: 4.1 (calc) (ref <5.0)
Triglycerides: 226 mg/dL — ABNORMAL HIGH (ref <150)

## 2023-01-02 LAB — HEMOGLOBIN A1C
Hgb A1c MFr Bld: 8.7 % of total Hgb — ABNORMAL HIGH (ref <5.7)
Mean Plasma Glucose: 203 mg/dL
eAG (mmol/L): 11.2 mmol/L

## 2023-01-14 ENCOUNTER — Ambulatory Visit: Payer: Managed Care, Other (non HMO) | Admitting: Neurology

## 2023-01-14 DIAGNOSIS — G4734 Idiopathic sleep related nonobstructive alveolar hypoventilation: Secondary | ICD-10-CM

## 2023-01-14 DIAGNOSIS — E13618 Other specified diabetes mellitus with other diabetic arthropathy: Secondary | ICD-10-CM

## 2023-01-14 DIAGNOSIS — G4733 Obstructive sleep apnea (adult) (pediatric): Secondary | ICD-10-CM

## 2023-01-14 DIAGNOSIS — Z024 Encounter for examination for driving license: Secondary | ICD-10-CM

## 2023-01-14 DIAGNOSIS — G4719 Other hypersomnia: Secondary | ICD-10-CM

## 2023-01-30 ENCOUNTER — Telehealth: Payer: Self-pay | Admitting: Neurology

## 2023-01-30 NOTE — Procedures (Signed)
[facility] Split Summary  [facility] SPLIT NIGHT INTERPRETATION REPORT   STUDY DATE: 01/14/2023     PATIENT NAME:  Phillip Powell, Phillip Powell         DATE OF BIRTH:  06-28-1946  PATIENT ID:  [id number]    TYPE OF STUDY:  SPLIT  READING PHYSICIAN: Melvyn Novas,  SCORING TECHNICIAN: Domingo Cocking   INDICATIONS:  This 77 year-old Male Patient had his first and last PSG/ HST in 12/ 2015 and received a CPAP in 07-2024, at factory settings. Auto CPAP. Phillip Powell is seen as a NEW PATIENT CONSULT on 10/23/22 , here with his wife ,a right-handed male with OSA. His baseline study at [facility] was 9 12-20. AHI 11/h at baseline then and with 405 minutes in hypoxia !. He was titrated by auto machine, didn't return to the lab. Sleep relevant medical history: history of sarcoidosis, Dr. Maple Hudson. No Nocturia, 1-2, no Sleep walking, had a Tonsillectomy, deviated septum, biological teeth. coughing with allergies. Severe COVID 2020 , in hospital.  The Epworth Sleepiness Scale was 12 out of 24 (scores above or equal to 10 are suggestive of hypersomnolence).  DESCRIPTION: A sleep technologist was in attendance for the duration of the recording.  Data collection, scoring, video monitoring, and reporting were performed in compliance with the AASM Manual for the Scoring of Sleep and Associated Events; (Hypopnea is scored based on the criteria listed in Section VIII D. 1b in the AASM Manual V2.6 using a 4% oxygen desaturation rule or Hypopnea is scored based on the criteria listed in Section VIII D. 1a in the AASM Manual V2.6 using 3% oxygen desaturation and /or arousal rule).  A physician certified by the American Board of Sleep Medicine reviewed each epoch of the study.  ADDITIONAL INFORMATION:  Height: 68.0 in Weight: 200 lb (BMI 30) Neck Size: 17.8 in Medications: Tylenol, Norvasc, Tenormin, Vitamin D3, Trulicity, Pepcid, Diabeta, Hydrodiuril,  Levsin, Arava, Imodium, Antivert, Glucophage XR, Zofran, Flomax, Zinc.  STUDY DETAILS: Lights off was at 21:41: and lights on 05:12: (451 minutes hours in bed). This study was performed with an initial diagnostic portion followed by positive airway pressure titration.  DIAGNOSTIC ANALYSIS   SLEEP CONTINUITY AND SLEEP ARCHITECTURE:  The diagnostic portion of the study began at 21:41 and ended at 02:00, for a recording time of 4h 19.60m minutes.  Total sleep time was 120 minutes (100.0% supine;  0.0% lateral;  0.0% prone, 0.0% REM sleep), with a decreased sleep efficiency at 46.3%.  Sleep latency was normal at 13.5 minutes.  REM sleep latency was decreased at 0.0 minutes.   BODY POSITION: Duration of total sleep in their respective position is as follows: supine 120 minutes (100.0%), non-supine 0.0 minutes (0.0%); right 00 minutes (0.0%), left 00 minutes (0.0%), and prone 00 minutes (0.0%). Total supine REM sleep time was 00 minutes (0.0% of total REM sleep). Of the total sleep time, the percentage of stage N1 sleep was 34.6%, stage N2 sleep was 65.4%, stage N3 sleep was 0.0%, and REM sleep was 0.0%. There were 0 Stage R periods observed during this portion of the study, 23 awakenings (i.e. transitions to Stage W from any sleep stage), and 59.0 total stage transitions. Wake after sleep onset (WASO) time accounted for 125 minutes.   AROUSAL (Baseline): There were 79.0 arousals in total, for an arousal index of 23.5 arousals/hour.  Of these, 23.0 were identified as respiratory-related arousals (11.5 /h), 2 were PLM-related arousals (  1.0 /h), and 44 were non-specific arousals (22.0 /h)   RESPIRATORY MONITORING:  Based on CMS criteria (using a 4% oxygen desaturation rule for scoring hypopneas), there were 111 apneas (109 obstructive; 0 central; 2 mixed), and 49 hypopneas.  Apnea index was 47.0. Hypopnea index was 6.5. The apnea-hypopnea index was 53.5 overall (53.5 supine; 0.0 REM, 0.0 supine REM). There were  2 respiratory effort-related arousals (RERAs).   The RERA index was 1.0 events/hr. Total respiratory disturbance index (RDI) was 54.5 events/hr. RDI results showed: supine RDI 54.5 /h; non-supine RDI 0.0 /h.   LIMB MOVEMENTS: There were 38 periodic limb movements of sleep (19.0/hr), of which 2 (1.0/h) were associated with an arousal.   OXIMETRY: Respiratory events were associated with oxyhemoglobin desaturations (nadir 89%) from a normal baseline (mean 97%). Total time spent at, or below 88% was 3.4 minutes, or 2.8%  of total sleep time. There were 0.0 occurrences of Cheyne Stokes breathing.    Analysis of electrocardiogram activity showed NSR   The average heart rate during sleep was 75 bpm, the slowest heart rate while the highest heart rate for the same period was 85 bpm.    TREATMENT ANALYSIS  The patient was fitted to a large AirFit F 10 FFM  in large size, starting at 5 cm water and finally explored pressure of 16 cm water.   The treatment portion of the study began at 02:00 and ended at 05:12, for a recording time of 3h 12.63m minutes.  Total sleep time was 128 minutes (100.0% supine;  0.0% lateral; 0.0% prone, 21.4% REM sleep), with a decreased sleep efficiency at 66.9%.  Sleep latency was increased at 33.0 minutes.  REM sleep latency was normal at 53.5 minutes.  Arousal index was 13.1 /hr. Of the total sleep time, the percentage of stage N1 sleep was 8.9%, stage N3 sleep was 0.0%, and REM sleep was 21.4%. There were 3 Stage R periods observed during this portion of the study, 9 awakenings (i.e. transitions to Stage W from any sleep stage), and 35.0 total stage transitions. Wake after sleep onset (WASO) time accounted for 30 minutes.   AROUSAL: There were 19.0 arousals in total, for an arousal index of 8.9 arousals/hour.  Of these, 7.0 were identified as respiratory-related arousals (3.3 /h), 2 were PLM-related arousals (0.9 /h), and 16 were non-specific arousals (7.5 /h)  RESPIRATORY  MONITORING:    While on PAP therapy, based on CMS criteria, the apnea-hypopnea index was 24.7 overall (24.7 supine; 13.1 REM).    LIMB MOVEMENTS: There were 105 periodic limb movements of sleep (49.0/hr), of which 2 (0.9/hr) were associated with an arousal.   OXIMETRY:  Respiratory events were associated with oxyhemoglobin desaturation nadir 85.0 % from a mean of 96.0%.  Total time spent at, or below 88% was 1.5 minutes, or 1.2% of TST.    EKG.  The average heart rate in NSR with isolated PVCs during sleep was 72 bpm. The maximum heart rate during sleep was 85 bpm. The maximum heart rate during recording was 89.  Analysis of electrocardiogram activity showed the highest heart rate for the treatment portion of the study was 88.0 beats per minute.   The average heart rate during sleep was 68 bpm, while the highest heart rate for the same period was 78 bpm.      IMPRESSION: Severe OSA with a baseline AHI of 55.5/h after 2 hours. titrated to CPAP beginning at 5 cm water through 16 cm water.  This severe sleep  apnea was controlled under 13 cm water with an AHI of 5.85/h and under the highest explored pressure setting at 16 cm water eliminated the AHI and included REM sleep.       RECOMMENDATIONS: New ResMed Auto CPAP 6- 19 cm water, 2 cm EPR, heated humidification and large FFM of patient's choice (a large AirFit F 10 FFM). This setting was able to control snoring.      Melvyn Novas, MD             General Information  Name: Phillip Powell, Phillip Powell BMI: 30.41 Physician: Melvyn Novas, MD  ID: [id number] Height: 68.0 in Technician: Domingo Cocking, RPSGT  Sex: Male Weight: 200.0 lb Record: [id number]  Age: 53 [[birth year]] Date: 01/14/2023     Medical & Medication History    Phillip Powell is a 77 y.o. male patient who is seen upon referral on 10/23/2022 from Dr Oneta Rack, MD , for a Sleep consultation. 10-23-2022. Marland Kitchen Chief concern according to patient : Patient had his first and last  PSG/ HST in 12/ 2015 and received a CPAP in 07-2024, at factory settings. Auto CPAP. Phillip Powell is seen as a NEW PATIENT CONSULT on 10/23/22 , here with his wife ,a right-handed male with OSA. His baseline study at [facility] was 9 12-20. AHI 11/h at baseline then and with 405 minutes in hypoxia !. He was titrated by auto machine, didn't return to the lab. Sleep relevant medical history: history of sarcoidosis, Dr. Maple Hudson. No Nocturia, 1-2, no Sleep walking, had a Tonsillectomy, deviated septum, biological teeth. coughing with allergies. Severe COVID 2020 , in hospital.  Tylenol, Norvasc, Tenormin, Vitamin D3, Trulicity, Pepcid, Diabeta, Hydrodiuril, Levsin, Arava, Imodium, Antivert, Glucophage XR, Zofran, Flomax, Zinc   Sleep Disorder      Comments   Patient arrived for a "SPLIT" (at AHI > to 10) diagnostic polysomnogram. Patient is currently a compliant Auto-PAP user and has been for several years. Procedure explained and all questions answered. Standard paste setup without complications. Patient was shown CPAP at 5cm with a medium Simplus full face mask as well as his large AirFit F10 full face mask. Patient slept supine and right. No significant snoring was heard. After two hours total sleep time, AHI = 55.5. CPAP was started at 5cm with patient's mask, the large AirFit F10 full face mask. CPAP increased to 16 cm in an effort to control respiratory events and abolish snoring. Overall reduced sleep efficiency reduced at %. Occasional coughing with arousals. Occasional PVC's observed. PLMS observed. Patient had one restroom visit.    Baseline Sleep Stage Information Baseline start time: 09:41:13 PM Baseline end time: 02:00:12 AM   Time Total Supine Side Prone Upright  Recording 4h 19.65m 4h 11.42m 0h 8.41m 0h 0.62m 0h 0.55m  Sleep 2h 0.68m 2h 0.56m 0h 0.17m 0h 0.35m 0h 0.35m   Latency N1 N2 N3 REM Onset Per. Slp. Eff.  Actual 0h 13.31m 0h 20.64m 0h 0.18m 0h 0.48m 0h 13.32m 0h 49.27m 46.33%   Stg Dur Wake  N1 N2 N3 REM  Total 125.5 41.5 78.5 0.0 0.0  Supine 117.5 41.5 78.5 0.0 0.0  Side 8.0 0.0 0.0 0.0 0.0  Prone 0.0 0.0 0.0 0.0 0.0  Upright 0.0 0.0 0.0 0.0 0.0   Stg % Wake N1 N2 N3 REM  Total 51.1 34.6 65.4 0.0 0.0  Supine 47.9 34.6 65.4 0.0 0.0  Side 3.3 0.0 0.0 0.0 0.0  Prone 0.0 0.0 0.0 0.0 0.0  Upright 0.0 0.0 0.0 0.0  0.0    CPAP Sleep Stage Information CPAP start time: 02:00:12 AM CPAP end time: 05:12:12 AM   Time Total Supine Side Prone Upright  Recording (TRT) 3h 12.48m 3h 9.86m 0h 2.59m 0h 0.23m 0h 0.44m  Sleep (TST) 2h 8.4m 2h 8.19m 0h 0.24m 0h 0.18m 0h 0.13m   Latency N1 N2 N3 REM Onset Per. Slp. Eff.  Actual 0h 33.57m 0h 35.70m 0h 0.70m 0h 53.80m 0h 33.27m 0h 54.59m 66.93%   Stg Dur Wake N1 N2 N3 REM  Total 59.0 11.5 89.5 0.0 27.5  Supine 56.5 11.5 89.5 0.0 27.5  Side 2.5 0.0 0.0 0.0 0.0  Prone 0.0 0.0 0.0 0.0 0.0  Upright 0.0 0.0 0.0 0.0 0.0   Stg % Wake N1 N2 N3 REM  Total 31.5 8.9 69.6 0.0 21.4  Supine 30.1 8.9 69.6 0.0 21.4  Side 1.3 0.0 0.0 0.0 0.0  Prone 0.0 0.0 0.0 0.0 0.0  Upright 0.0 0.0 0.0 0.0 0.0    Baseline Respiratory Information Apnea Summary Sub Supine Side Prone Upright  Total 94 Total 94 94 0 0 0    REM 0 0 0 0 0    NREM 94 94 0 0 0  Obs 93 REM 0 0 0 0 0    NREM 93 93 0 0 0  Mix 1 REM 0 0 0 0 0    NREM 1 1 0 0 0  Cen 0 REM 0 0 0 0 0    NREM 0 0 0 0 0   Rera Summary Sub Supine Side Prone Upright  Total 2 Total 2 2 0 0 0    REM 0 0 0 0 0    NREM 2 2 0 0 0   Hypopnea Summary Sub Supine Side Prone Upright  Total 17 Total 17 17 0 0 0    REM 0 0 0 0 0    NREM 17 17 0 0 0   4% Hypopnea Summary Sub Supine Side Prone Upright  Total (4%) 13 Total 13 13 0 0 0    REM 0 0 0 0 0    NREM 13 13 0 0 0     AHI Total Obs Mix Cen  55.50 Apnea 47.00 46.50 0.50 0.00   Hypopnea 8.50 -- -- --  53.50 Hypopnea (4%) 6.50 -- -- --    Total Supine Side Prone Upright  Position AHI 55.50 55.50 0.00 0.00 0.00  REM AHI 0.00   NREM AHI 55.50   Position RDI 56.50  56.50 0.00 0.00 0.00  REM RDI 0.00   NREM RDI 56.50    4% Hypopnea Total Supine Side Prone Upright  Position AHI (4%) 53.50 53.50 0.00 0.00 0.00  REM AHI (4%) 0.00   NREM AHI (4%) 53.50   Position RDI (4%) 54.50 54.50 0.00 0.00 0.00  REM RDI (4%) 0.00   NREM RDI (4%) 54.50    CPAP Respiratory Information Apnea Summary Sub Supine Side Prone Upright  Total 17 Total 17 17 0 0 0    REM 13 13 0 0 0    NREM 4 4 0 0 0  Obs 16 REM 12 12 0 0 0    NREM 4 4 0 0 0  Mix 1 REM 1 1 0 0 0    NREM 0 0 0 0 0  Cen 0 REM 0 0 0 0 0    NREM 0 0 0 0 0   Rera Summary Sub Supine Side Prone Upright  Total 0 Total 0 0 0 0 0    REM 0 0 0 0 0    NREM 0 0 0 0 0   Hypopnea Summary Sub Supine Side Prone Upright  Total 62 Total 62 62 0 0 0    REM 17 17 0 0 0    NREM 45 45 0 0 0   4% Hypopnea Summary Sub Supine Side Prone Upright  Total (4%) 36 Total 36 36 0 0 0    REM 15 15 0 0 0    NREM 21 21 0 0 0     AHI Total Obs Mix Cen  36.89 Apnea 7.94 7.47 0.47 0.00   Hypopnea 28.95 -- -- --  24.75 Hypopnea (4%) 16.81 -- -- --    Total Supine Side Prone Upright  Position AHI 36.89 36.89 0.00 0.00 0.00  REM AHI 65.45   NREM AHI 29.11   Position RDI 36.89 36.89 0.00 0.00 0.00  REM RDI 65.45   NREM RDI 29.11    4% Hypopnea Total Supine Side Prone Upright  Position AHI (4%) 24.75 24.75 0.00 0.00 0.00  REM AHI (4%) 61.09   NREM AHI (4%) 14.85   Position RDI (4%) 24.75 24.75 0.00 0.00 0.00  REM RDI (4%) 61.09   NREM RDI (4%) 14.85    Desaturation Information (Baseline)  <100% <90% <80% <70% <60% <50% <40%  Supine 48 3 0 0 0 0 0  Side 0 0 0 0 0 0 0  Prone 0 0 0 0 0 0 0  Upright 0 0 0 0 0 0 0  Total 48 3 0 0 0 0 0  Desaturation threshold setting: 4% Minimum desaturation setting: 10 seconds SaO2 nadir: 89% The longest event was a 22 sec obstructive Hypopneawith a minimum SaO2 of 93%. The lowest SaO2 was 89% associated with a 12 sec obstructive Apnea. Awakening/Arousal Information (Baseline) #  of Awakenings 23  Wake after sleep onset 125.16m  Wake after persistent sleep 99.88m   Arousal Assoc. Arousals Index  Apneas 19 9.5  Hypopneas 2 1.0  Leg Movements 2 1.0  Snore 0.0 0.0  PTT Arousals 0 0.0  Spontaneous 44 22.0  Total 69 34.5   Desaturation Information (CPAP)  <100% <90% <80% <70% <60% <50% <40%  Supine 44 10 0 0 0 0 0  Side 0 0 0 0 0 0 0  Prone 0 0 0 0 0 0 0  Upright 0 0 0 0 0 0 0  Total 44 10 0 0 0 0 0  Desaturation threshold setting: 4% Minimum desaturation setting: 10 seconds SaO2 nadir: 85% The longest event was a 31 sec obstructive Apnea with a minimum SaO2 of 85%. The lowest SaO2 was 85% associated with a 25 sec obstructive Apnea. Awakening/Arousal Information (CPAP) # of Awakenings 9  Wake after sleep onset 30.73m  Wake after persistent sleep 13.35m   Arousal Assoc. Arousals Index  Apneas 2 0.9  Hypopneas 5 2.3  Leg Movements 5 2.3  Snore 0.0 0.0  PTT Arousals 0 0.0  Spontaneous 16 7.5  Total 28 13.1     EKG Rates (Baseline) EKG Avg Max Min  Awake 77 89 72  Asleep 75 85 73  EKG Events: N/A Myoclonus Information (Baseline) PLMS LMs Index  Total LMs during PLMS 38 19.0  LMs w/ Microarousals 2 1.0   LM LMs Index  w/ Microarousal 0 0.0  w/ Awakening 1 0.5  w/ Resp Event 0 0.0  Spontaneous 17  8.5  Total 17 8.5   EKG Rates (CPAP) EKG Avg Max Min  Awake 74 88 65  Asleep 68 78 64  EKG Events: N/A Myoclonus Information (CPAP) PLMS LMs Index  Total LMs during PLMS 105 49.0  LMs w/ Microarousals 2 0.9   LM LMs Index  w/ Microarousal 3 1.4  w/ Awakening 2 0.9  w/ Resp Event 0 0.0  Spontaneous 7 3.3  Total 10 4.7    General Information  Name: Phillip Powell, Phillip Powell BMI: 30 Physician: ,   ID: [id number] Height: 68 in Technician: Domingo Cocking  Sex: Male Weight: 200 lb Record: [id number]  Age: 54 [07-Nov-1945] Date: 01/14/2023 Scorer: Domingo Cocking           Pressure Settings Phase BASELINE THERAPY THERAPY THERAPY THERAPY  THERAPY THERAPY THERAPY   Protocol - - - - - - - -   Max Pressure - - - - - - - -   IPAP 00 05 07 08 09 11 13 15    EPAP 00 05 07 08 09 11 13 15    PS - - - - - - - -   Device - - - - - - - -   Mask - - - - - - - -   Backup Rate - - - - - - - -   Humidity - - - - - - - -  Time TRT 259.22m 56.63m 9.1m 20.6m 23.58m 38.43m 21.61m 14.42m   TST 120.81m 6.8m 9.46m 19.43m 23.101m 32.22m 20.49m 12.26m   Event Epoch 9 [phone] [phone]  Sleep Stage % Wake 51.1 89.3 0.0 2.5 0.0 16.9 2.4 10.7   % REM 0.0 0.0 0.0 0.0 76.1 12.5 0.0 16.0   % N1 34.6 66.7 0.0 0.0 0.0 15.6 2.4 12.0   % N2 65.4 33.3 100.0 100.0 23.9 71.9 97.6 72.0   % N3 0.0 0.0 0.0 0.0 0.0 0.0 0.0 0.0  Respiratory Total Events 111 6 7 14 31 11 4 6    Obs. Apn. 93 0 0 0 13 0 2 1   Mixed Apn. 1 0 0 0 1 0 0 0   Cen. Apn. 0 0 0 0 0 0 0 0   Obs. Hyp. 17 6 7 14 17 11 2 5    Cen. Hyp. 0 0 0 0 0 0 0 0   AHI 55.50 60.00 44.21 43.08 80.87 20.63 11.71 28.80   Supine AHI 55.50 60.00 44.21 43.08 80.87 20.63 11.71 28.80   Prone AHI 0.00 0.00 0.00 0.00 0.00 0.00 0.00 0.00   Side AHI 0.00 0.00 0.00 0.00 0.00 0.00 0.00 0.00  Respiratory (4%) Obs. Hyp. (4%) 13.00 1.00 5.00 7.00 13.00 7.00 0.00 3.00   Cen. Hyp. (4%) 0.00 0.00 0.00 0.00 0.00 0.00 0.00 0.00   AHI (4%) 53.50 10.00 31.58 21.54 70.43 13.13 5.85 19.20   Supine AHI (4%) 53.50 10.00 31.58 21.54 70.43 13.13 5.85 19.20   Prone AHI (4%) 0.00 0.00 0.00 0.00 0.00 0.00 0.00 0.00   Side AHI (4%) 0.00 0.00 0.00 0.00 0.00 0.00 0.00 0.00  Desat Profile <= 90% 4.41m 0.54m 0.35m 1.39m 3.65m 0.65m 0.68m 0.31m   <= 80% 3.72m 0.46m 0.61m 0.52m 0.39m 0.24m 0.73m 0.41m   <= 70% 3.38m 0.74m 0.76m 0.43m 0.23m 0.75m 0.45m 0.58m   <= 60% 3.27m 0.61m 0.57m 0.11m 0.11m 0.4m 0.37m 0.41m  Arousal Index Apnea 9.5 0.0 0.0 0.0 2.6 0.0 2.9 0.0   Hypopnea 1.0 10.0 0.0 0.0 5.2  0.0 0.0 9.6   LM 1.0 0.0 0.0 3.1 0.0 1.9 2.9 0.0   Spontaneous 22.0 50.0 0.0 0.0 2.6 9.4 2.9 19.2   Pressure Settings Phase THERAPY   Protocol -   Max Pressure -   IPAP 16    EPAP 16   PS -   Device -   Mask -   Backup Rate -   Humidity -  Time TRT 10.59m   TST 5.83m   Event Epoch 891  Sleep Stage % Wake 0.0   % REM 72.7   % N1 9.1   % N2 18.2   % N3 0.0  Respiratory Total Events 0   Obs. Apn. 0   Mixed Apn. 0   Cen. Apn. 0   Obs. Hyp. 0   Cen. Hyp. 0   AHI 0.00   Supine AHI 0.00   Prone AHI 0.00   Side AHI 0.00  Respiratory (4%) Obs. Hyp. (4%) 0.00   Cen. Hyp. (4%) 0.00   AHI (4%) 0.00   Supine AHI (4%) 0.00   Prone AHI (4%) 0.00   Side AHI (4%) 0.00  Desat Profile <= 90% 0.99m   <= 80% 0.73m   <= 70% 0.28m   <= 60% 0.85m  Arousal Index Apnea 0.0   Hypopnea 0.0   LM 21.8   Spontaneous 0.0

## 2023-01-30 NOTE — Progress Notes (Signed)
Severe OSA confirmed at AHI 55/h and successfully titrated to 16 cm water pressure. Order written.   New ResMed Auto CPAP 6- 19 cm water, 2 cm EPR, heated humidification and large FFM of patient's choice (a large AirFit F 10 FFM). This setting was able to control snoring.

## 2023-01-30 NOTE — Telephone Encounter (Signed)
Dr Dohmeier has not yet reviewed the patient's sleep study. I will make her aware they are reaching out. Once we have the results, we will contact.

## 2023-01-30 NOTE — Addendum Note (Signed)
Addended by: Melvyn Novas on: 01/30/2023 06:36 PM   Modules accepted: Orders

## 2023-01-30 NOTE — Telephone Encounter (Signed)
Pt's wife is asking for a call with results to sleep study

## 2023-01-30 NOTE — Telephone Encounter (Signed)
Pt's wife, Elease Hashimoto (on [place]) calling about pt's results from sleep study done on 01/14/23.

## 2023-01-30 NOTE — Telephone Encounter (Signed)
Pt wife had also called earlier (see April phone note) at this time, Dr Vickey Huger has not yet reviewed the sleep study results. Typical turn around can be 10-14 business days. As soon as Dr Vickey Huger reviews the sleep study, we will contact the pt.

## 2023-01-31 ENCOUNTER — Other Ambulatory Visit: Payer: Self-pay

## 2023-01-31 DIAGNOSIS — N138 Other obstructive and reflux uropathy: Secondary | ICD-10-CM

## 2023-01-31 MED ORDER — TAMSULOSIN HCL 0.4 MG PO CAPS
ORAL_CAPSULE | ORAL | 3 refills | Status: DC
Start: 2023-01-31 — End: 2023-10-31

## 2023-01-31 NOTE — Telephone Encounter (Signed)
The pt's wife called back. I advised pt that Dr. Vickey Huger reviewed their sleep study results and found that pt has severe sleep apnea. Dr. Vickey Huger recommends that pt continuing use of . I reviewed PAP compliance expectations with the pt. Pt is agreeable to starting a CPAP. I advised pt that an order will be sent to a DME, [facility], and Christoper Allegra will call the pt within about one week after they file with the pt's insurance. Christoper Allegra will show the pt how to use the machine, fit for masks, and troubleshoot the CPAP if needed. A follow up appt was made for insurance purposes with Dr. Vickey Huger on 04/23/2023. Pt verbalized understanding to arrive 15 minutes early and bring their CPAP. Pt verbalized understanding of results. Pt had no questions at this time but was encouraged to call back if questions arise. I have sent the order to [facility] and have received confirmation that they have received the order.

## 2023-01-31 NOTE — Telephone Encounter (Signed)
Attempted to call the wife back. There was no answer. LVM asking for a call back

## 2023-03-05 ENCOUNTER — Encounter: Payer: Self-pay | Admitting: Internal Medicine

## 2023-03-05 NOTE — Patient Instructions (Signed)

## 2023-03-05 NOTE — Progress Notes (Unsigned)
Future Appointments  Date Time Provider Department  03/06/2023                 6 mo ov 11:30 AM Lucky Cowboy, MD [facility]  04/16/2023  9:30 AM Raynelle Dick, NP [facility]  04/23/2023  8:30 AM Dohmeier, Porfirio Mylar, MD [facility]  10/15/2023                 cpe 11:00 AM Lucky Cowboy, MD [facility]    History of Present Illness:       [name]  with HTN, HLD, T2_NIDDM and Vitamin D Deficiency  presents for 6 month follow up.  In May 2022, patient underwent a total Lt. TKA.  Patient has hx/o Gout (off meds) . He also has hx/o OSA on CPAP with improved restorative sleep.  On 09/19/2019, Abd CT scan showed Aortic atherosclerosis. Patient has GERD controlled on his  meds.        Patient is treated for HTN  (1985) & BP has been controlled at home. Today's BP is at goal -                         . Patient has had no complaints of any cardiac type chest pain, palpitations, dyspnea / orthopnea /PND, dizziness, claudication, or dependent edema.        Hyperlipidemia is controlled with diet & meds. Patient denies myalgias or other med SE's. 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  Last A1c was not at goal:  Lab Results  Component Value Date   HGBA1C 8.7 (H) 01/01/2023                                                     Further, the patient also has history of Vitamin D Deficiency ("37" /2008) and supplements vitamin D without any suspected side-effects. Last vitamin D was low :  Lab Results  Component Value Date   VD25OH 64 09/25/2022       Current Outpatient Medications on File Prior to Visit  Medication Sig   acetaminophen  (TYLENOL) 500 MG tablet Take 1,000 mg by mouth every 6 (six) hours as needed for moderate pain.   amLODipine (NORVASC) 10 MG tablet Take  1/2 to 1 tablet  Daily  for BP (Patient taking differently: No sig reported)   atenolol (TENORMIN) 100 MG tablet TAKE 1 TABLET  DAILY FOR BLOOD PRESSURE   Dulaglutide (TRULICITY) 1.5 MG/0.5ML SOPN Inject 1.5 mg into the skin once a week.   famotidine (PEPCID) 40 MG tablet Take     1 tablet      at Bedtime         gabapentin (NEURONTIN) 100 MG capsule Take 1 to 2 capsules 2 to 3 x  /day as needed for Chronic Pain   glyBURIDE (DIABETA) 5 MG tablet Take 1/2 to 1 tablet     3 x /  day       with Meals for Diabetes    leflunomide (ARAVA) 10 MG tablet Take 10 mg by mouth daily.   meclizine (ANTIVERT) 25 MG tablet Take   3   times daily as needed for dizziness.   metFORMIN XR 500 MG 24 hr tablet Take     1 to 2 tablets       2 x /day        with Meals      for Diabetes (Patient taking differently: Take 500 mg by mouth daily with breakfast.)   methocarbamol (ROBAXIN) 500 MG tablet Take 1 tablet  every 6  hours as needed for muscle spasms.   olmesartan (BENICAR) 40 MG tablet Take 1 tablet  daily.   ondansetron (ZOFRAN) 8 MG tablet Take 1/2 to 1 tablet 3 x day if needed for Nausea   oxybutynin (DITROPAN) 5 MG tablet Take 1 tablet   every 8  hours as needed for bladder spasms.   tamsulosin (FLOMAX) 0.4 MG CAPS capsule Take      1 tablet       at Bedtime        for Prostate (Patient taking differently: Take 0.4 mg by mouth at bedtime.)   traMADol (ULTRAM) 50 MG tablet Take 1-2 tablets every 6   hours as needed     Allergies  Allergen Reactions   Ace Inhibitors Other (See Comments)    Unknown   Ibuprofen Other (See Comments)    GI upset   Invokana [Canagliflozin] Other (See Comments)    Unknown   Quinidine Other (See Comments)    Unknown    PMHx:   Past Medical History:  Diagnosis Date    Covid-19 Positive (02/06/2019)  02/11/2019   Arthritis    BPH (benign  prostatic hypertrophy)    Diabetes mellitus without complication (HCC)    type w    GERD (gastroesophageal reflux disease)    Hepatic steatosis    History of kidney stones    Hydroureteronephrosis    Hypertension    Hypogonadism male    Mass    left shoulder AC joint   OSA (obstructive sleep apnea)    wears CPAP wears 4 hours per nite    PONV (postoperative nausea and vomiting)    Renal calculi    Sarcoidosis    Vitamin D deficiency      Immunization History  Administered Date(s) Administered   Fluad Quad(high Dose 65+) 04/28/2019   Influenza Split 06/23/2013   Influenza, High Dose Seasonal PF 06/29/2014, 05/24/2015, 04/24/2016, 06/11/2017, 07/08/2020   Influenza,inj,quad, With Preservative 04/23/2017   Influenza-Unspecified 04/28/2019   PFIZER(Purple Top)SARS-COV-2 Vaccination 08/23/2019, 09/09/2019, 09/13/2019   Pneumococcal Conjugate-13 01/05/2014   Pneumococcal-Unspecified 07/24/1994   Td 06/23/2002   Zoster, Live 10/23/2010     Past Surgical History:  Procedure Laterality Date   APPENDECTOMY     COLONOSCOPY     CYSTOSCOPY/URETEROSCOPY/HOLMIUM LASER/STENT PLACEMENT Right 10/01/2019   Procedure: CYSTOSCOPY/RETROGRADE/URETEROSCOPY/HOLMIUM LASER/STENT PLACEMENT;  Surgeon: Rene Paci, MD;  Location: [facility];  Service: Urology;  Laterality: Right;  ONLY NEEDS 45 MIN   HAND SURGERY Right 2019   Dr. Amanda Pea, tendon repair of R thumb   HERNIA REPAIR  2004   umbilical   KNEE ARTHROSCOPY     ORIF CLAVICULAR FRACTURE Left 03/29/2018   Procedure: LEFT SHOULDER OPEN DISTAL CLAVICLE RESECTION, CYST EXCISION;  Surgeon: Beverely Low, MD;  Location: [facility] OR;  Service: Orthopedics;  Laterality: Left;   ROTATOR CUFF  REPAIR Left 1996   ROTATOR CUFF REPAIR Right 1997   TONSILLECTOMY  1952   TOTAL KNEE ARTHROPLASTY Left 11/22/2020   Procedure: TOTAL KNEE ARTHROPLASTY;  Surgeon: Ollen Gross, MD;  Location: [facility];  Service: Orthopedics;  Laterality: Left;    FHx:     Reviewed / unchanged  SHx:    Reviewed / unchanged   Systems Review:  Constitutional: Denies fever, chills, wt changes, headaches, insomnia, fatigue, night sweats, change in appetite. Eyes: Denies redness, blurred vision, diplopia, discharge, itchy, watery eyes.  ENT: Denies discharge, congestion, post nasal drip, epistaxis, sore throat, earache, hearing loss, dental pain, tinnitus, vertigo, sinus pain, snoring.  CV: Denies chest pain, palpitations, irregular heartbeat, syncope, dyspnea, diaphoresis, orthopnea, PND, claudication or edema. Respiratory: denies cough, dyspnea, DOE, pleurisy, hoarseness, laryngitis, wheezing.  Gastrointestinal: Denies dysphagia, odynophagia, heartburn, reflux, water brash, abdominal pain or cramps, nausea, vomiting, bloating, diarrhea, constipation, hematemesis, melena, hematochezia  or hemorrhoids. Genitourinary: Denies dysuria, frequency, urgency, nocturia, hesitancy, discharge, hematuria or flank pain. Musculoskeletal: Denies arthralgias, myalgias, stiffness, jt. swelling, pain, limping or strain/sprain.  Skin: Denies pruritus, rash, hives, warts, acne, eczema or change in skin lesion(s). Neuro: No weakness, tremor, incoordination, spasms, paresthesia or pain. Psychiatric: Denies confusion, memory loss or sensory loss. Endo: Denies change in weight, skin or hair change.  Heme/Lymph: No excessive bleeding, bruising or enlarged lymph nodes.  Physical Exam  There were no vitals taken for this visit.  Appears  well nourished, well groomed  and in no distress.  Eyes: PERRLA, EOMs, conjunctiva no swelling or erythema. Sinuses: No frontal/maxillary tenderness ENT/Mouth: EAC's clear, TM's nl w/o erythema, bulging. Nares clear w/o erythema, swelling, exudates. Oropharynx clear without erythema or exudates. Oral hygiene is good. Tongue normal, non obstructing. Hearing intact.  Neck: Supple. Thyroid not palpable. Car 2+/2+ without bruits, nodes or JVD. Chest:  Respirations nl with BS clear & equal w/o rales, rhonchi, wheezing or stridor.  Cor: Heart sounds normal w/ regular rate and rhythm without sig. murmurs, gallops, clicks or rubs. Peripheral pulses normal and equal  without edema.  Abdomen: Soft & bowel sounds normal. Non-tender w/o guarding, rebound, hernias, masses or organomegaly.  Lymphatics: Unremarkable.  Musculoskeletal: Full ROM all peripheral extremities, joint stability, 5/5 strength and normal gait.  Skin: Warm, dry without exposed rashes, lesions or ecchymosis apparent.  Neuro: Cranial nerves intact, reflexes equal bilaterally. Sensory-motor testing grossly intact. Tendon reflexes grossly intact.  Pysch: Alert & oriented x 3.  Insight and judgement nl & appropriate. No ideations.  Assessment and Plan:  1. Essential hypertension  - Continue medication, monitor blood pressure at home.  - Continue DASH diet.  Reminder to go to the ER if any CP,  SOB, nausea, dizziness, severe HA, changes vision/speech.   - CBC with Differential/Platelet - COMPLETE METABOLIC PANEL WITH GFR - Magnesium - TSH  2. Hyperlipidemia associated with type 2 diabetes mellitus (HCC)  - Continue diet/meds, exercise,& lifestyle modifications.  - Continue monitor periodic cholesterol/liver & renal functions    - Lipid panel - TSH  3. Type 2 diabetes mellitus with stage 2 chronic kidney  disease, without long-term current use of insulin (HCC)  - Continue diet, exercise  - Lifestyle modifications.  - Monitor appropriate labs. - Hemoglobin A1c - Insulin, random  4. Vitamin D deficiency  - Continue supplementation.  - VITAMIN D 25 Hydroxy   5. Aortic atherosclerosis (HCC) by Abd CT scan ion 09/19/2019  - Lipid panel  6. Gastroesophageal reflux disease  - CBC with Differential/Platelet  7. Medication management  - CBC with Differential/Platelet - COMPLETE METABOLIC PANEL WITH GFR - Magnesium - Lipid panel - TSH - Hemoglobin A1 -  Insulin, random - VITAMIN D 25 Hydroxy        Discussed  regular exercise, BP monitoring, weight control to achieve/maintain BMI less than 25 and discussed med and SE's. Recommended labs to assess and monitor clinical status with further disposition pending results of labs.  I discussed the assessment and treatment plan with the patient. The patient was provided an opportunity to ask questions and all were answered. The patient agreed with the plan and demonstrated an understanding of the instructions.  I provided over 30 minutes of exam, counseling, chart review and  complex critical decision making.       The patient was advised to call back or seek an in-person evaluation if the symptoms worsen or if the condition fails to improve as anticipated.   Marinus Maw, MD

## 2023-03-05 NOTE — Progress Notes (Incomplete Revision)
Future Appointments  Date Time Provider Department  03/06/2023                 6 mo ov 11:30 AM Lucky Cowboy, MD [facility]  04/16/2023  9:30 AM Raynelle Dick, NP [facility]  04/23/2023  8:30 AM Dohmeier, Porfirio Mylar, MD [facility]  10/15/2023                 cpe 11:00 AM Lucky Cowboy, MD [facility]    History of Present Illness:       [name]  with HTN, HLD, T2_NIDDM and Vitamin D Deficiency  presents for 6 month follow up.  In May 2022, patient underwent a total Lt. TKA.  Patient has hx/o Gout (off meds) . He also has hx/o OSA on CPAP with improved restorative sleep.  On 09/19/2019, Abd CT scan showed Aortic atherosclerosis. Patient has GERD controlled on his  meds.       On August 1 , patient went to ER at [facility] in [place], [place] for a swollen RLE. Doppler U/S found   Backer's Cyst & ruled out a DVT. He was apparently suspected to have a cellulitis of the RLE & was treated with a 7 day  course of Keflex.                                    Patient is treated for HTN  (1985) & BP has been controlled at home. Today's BP is  not at goal -  164/80 . Patient has had no complaints of any cardiac type chest pain, palpitations, dyspnea Pollyann Kennedy /PND, dizziness, claudication or dependent edema.        Hyperlipidemia is controlled with diet & meds. Patient denies myalgias or other med SE's. 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  Last A1c was not at goal:  Lab Results  Component Value Date   HGBA1C 8.7 (H) 01/01/2023                                                     Further, the patient also has history of Vitamin D  Deficiency ("37" /2008) and supplements vitamin D without any suspected side-effects. Last vitamin D was low :  Lab Results  Component Value Date   VD25OH 64 09/25/2022       Current Outpatient Medications on File Prior to Visit  Medication Sig   acetaminophen (TYLENOL) 500 MG tablet Take 1,000 mg by mouth every 6 (six) hours as needed for moderate pain.   amLODipine (NORVASC) 10 MG tablet Take  1/2 to 1 tablet  Daily  for BP (Patient taking differently: No sig reported)   atenolol (TENORMIN) 100 MG tablet TAKE 1 TABLET  DAILY FOR BLOOD PRESSURE   Dulaglutide (TRULICITY) 1.5 MG/0.5ML SOPN Inject 1.5 mg into the skin once a week.  famotidine (PEPCID) 40 MG tablet Take     1 tablet      at Bedtime         gabapentin (NEURONTIN) 100 MG capsule Take 1 to 2 capsules 2 to 3 x  /day as needed for Chronic Pain   glyBURIDE (DIABETA) 5 MG tablet Take 1/2 to 1 tablet     3 x /day       with Meals for Diabetes    leflunomide (ARAVA) 10 MG tablet Take 10 mg by mouth daily.   meclizine (ANTIVERT) 25 MG tablet Take   3   times daily as needed for dizziness.   metFORMIN XR 500 MG 24 hr tablet Take     1 to 2 tablets       2 x /day        with Meals      for Diabetes (Patient taking differently: Take 500 mg by mouth daily with breakfast.)   methocarbamol (ROBAXIN) 500 MG tablet Take 1 tablet  every 6  hours as needed for muscle spasms.   olmesartan (BENICAR) 40 MG tablet Take 1 tablet  daily.   ondansetron (ZOFRAN) 8 MG tablet Take 1/2 to 1 tablet 3 x day if needed for Nausea   oxybutynin (DITROPAN) 5 MG tablet Take 1 tablet   every 8  hours as needed for bladder spasms.   tamsulosin (FLOMAX) 0.4 MG CAPS capsule Take      1 tablet       at Bedtime        for Prostate (Patient taking differently: Take 0.4 mg by mouth at bedtime.)   traMADol (ULTRAM) 50 MG tablet Take 1-2 tablets every 6   hours as needed     Allergies  Allergen Reactions   Ace Inhibitors Other (See Comments)    Unknown   Ibuprofen  Other (See Comments)    GI upset   Invokana [Canagliflozin] Other (See Comments)    Unknown   Quinidine Other (See Comments)    Unknown    PMHx:   Past Medical History:  Diagnosis Date    Covid-19 Positive (02/06/2019)  02/11/2019   Arthritis    BPH (benign prostatic hypertrophy)    Diabetes mellitus without complication (HCC)    type w    GERD (gastroesophageal reflux disease)    Hepatic steatosis    History of kidney stones    Hydroureteronephrosis    Hypertension    Hypogonadism male    Mass    left shoulder AC joint   OSA (obstructive sleep apnea)    wears CPAP wears 4 hours per nite    PONV (postoperative nausea and vomiting)    Renal calculi    Sarcoidosis    Vitamin D deficiency      Immunization History  Administered Date(s) Administered   Fluad Quad(high Dose 65+) 04/28/2019   Influenza Split 06/23/2013   Influenza, High Dose Seasonal PF 06/29/2014, 05/24/2015, 04/24/2016, 06/11/2017, 07/08/2020   Influenza,inj,quad, With Preservative 04/23/2017   Influenza-Unspecified 04/28/2019   PFIZER(Purple Top)SARS-COV-2 Vaccination 08/23/2019, 09/09/2019, 09/13/2019   Pneumococcal Conjugate-13 01/05/2014   Pneumococcal-Unspecified 07/24/1994   Td 06/23/2002   Zoster, Live 10/23/2010     Past Surgical History:  Procedure Laterality Date   APPENDECTOMY     COLONOSCOPY     CYSTOSCOPY/URETEROSCOPY/HOLMIUM LASER/STENT PLACEMENT Right 10/01/2019   Procedure: CYSTOSCOPY/RETROGRADE/URETEROSCOPY/HOLMIUM LASER/STENT PLACEMENT;  Surgeon: Rene Paci, MD;  Location: [facility];  Service: Urology;  Laterality: Right;  ONLY NEEDS 45 MIN  HAND SURGERY Right 2019   Dr. Amanda Pea, tendon repair of R thumb   HERNIA REPAIR  2004   umbilical   KNEE ARTHROSCOPY     ORIF CLAVICULAR FRACTURE Left 03/29/2018   Procedure: LEFT SHOULDER OPEN DISTAL CLAVICLE RESECTION, CYST EXCISION;  Surgeon: Beverely Low, MD;  Location: [facility] OR;  Service: Orthopedics;  Laterality: Left;    ROTATOR CUFF REPAIR Left 1996   ROTATOR CUFF REPAIR Right 1997   TONSILLECTOMY  1952   TOTAL KNEE ARTHROPLASTY Left 11/22/2020   Procedure: TOTAL KNEE ARTHROPLASTY;  Surgeon: Ollen Gross, MD;  Location: [facility];  Service: Orthopedics;  Laterality: Left;    FHx:    Reviewed / unchanged  SHx:    Reviewed / unchanged   Systems Review:  Constitutional: Denies fever, chills, wt changes, headaches, insomnia, fatigue, night sweats, change in appetite. Eyes: Denies redness, blurred vision, diplopia, discharge, itchy, watery eyes.  ENT: Denies discharge, congestion, post nasal drip, epistaxis, sore throat, earache, hearing loss, dental pain, tinnitus, vertigo, sinus pain, snoring.  CV: Denies chest pain, palpitations, irregular heartbeat, syncope, dyspnea, diaphoresis, orthopnea, PND, claudication or edema. Respiratory: denies cough, dyspnea, DOE, pleurisy, hoarseness, laryngitis, wheezing.  Gastrointestinal: Denies dysphagia, odynophagia, heartburn, reflux, water brash, abdominal pain or cramps, nausea, vomiting, bloating, diarrhea, constipation, hematemesis, melena, hematochezia  or hemorrhoids. Genitourinary: Denies dysuria, frequency, urgency, nocturia, hesitancy, discharge, hematuria or flank pain. Musculoskeletal: Denies arthralgias, myalgias, stiffness, jt. swelling, pain, limping or strain/sprain.  Skin: Denies pruritus, rash, hives, warts, acne, eczema or change in skin lesion(s). Neuro: No weakness, tremor, incoordination, spasms, paresthesia or pain. Psychiatric: Denies confusion, memory loss or sensory loss. Endo: Denies change in weight, skin or hair change.  Heme/Lymph: No excessive bleeding, bruising or enlarged lymph nodes.  Physical Exam  BP (!) 164/80   Pulse 76   Temp 98 F (36.7 C)   Resp 16   Ht 5' 7.5" (1.715 m)   Wt 209 lb 3.2 oz (94.9 kg)   SpO2 96%   BMI 32.28 kg/m   Appears  well nourished, well groomed  and in no distress.  Eyes: PERRLA, EOMs, conjunctiva  no swelling or erythema. Sinuses: No frontal/maxillary tenderness ENT/Mouth: EAC's clear, TM's nl w/o erythema, bulging. Nares clear w/o erythema, swelling, exudates. Oropharynx clear without erythema or exudates. Oral hygiene is good. Tongue normal, non obstructing. Hearing intact.  Neck: Supple. Thyroid not palpable. Car 2+/2+ without bruits, nodes or JVD. Chest: Respirations nl with BS clear & equal w/o rales, rhonchi, wheezing or stridor.  Cor: Heart sounds normal w/ regular rate and rhythm without sig. murmurs, gallops, clicks or rubs. Peripheral pulses normal and equal  without edema.  Abdomen: Soft & bowel sounds normal. Non-tender w/o guarding, rebound, hernias, masses or organomegaly.  Lymphatics: Unremarkable.  Musculoskeletal: Full ROM all peripheral extremities, joint stability, 5/5 strength and normal gait. RLE is clearly swollen with tenderness with calf compression. Lt PP 3(+) & Rt obcruo  Skin: Warm, dry without exposed rashes, lesions or ecchymosis apparent.  Neuro: Cranial nerves intact, reflexes equal bilaterally. Sensory-motor testing grossly intact. Tendon reflexes grossly intact.  Pysch: Alert & oriented x 3.  Insight and judgement nl & appropriate. No ideations.  Assessment and Plan:  1. Essential hypertension  - Continue medication, monitor blood pressure at home.  - Continue DASH diet.  Reminder to go to the ER if any CP,  SOB, nausea, dizziness, severe HA, changes vision/speech.   - CBC with Differential/Platelet - COMPLETE METABOLIC PANEL WITH GFR - Magnesium -  TSH  2. Hyperlipidemia associated with type 2 diabetes mellitus (HCC)  - Continue diet/meds, exercise,& lifestyle modifications.  - Continue monitor periodic cholesterol/liver & renal functions    - Lipid panel - TSH  3. Type 2 diabetes mellitus with stage 2 chronic kidney  disease, without long-term current use of insulin (HCC)  - Continue diet, exercise  - Lifestyle modifications.  - Monitor  appropriate labs. - Hemoglobin A1c - Insulin, random  4. Vitamin D deficiency  - Continue supplementation.  - VITAMIN D 25 Hydroxy   5. Aortic atherosclerosis (HCC) by Abd CT scan ion 09/19/2019  - Lipid panel  6. Gastroesophageal reflux disease  - CBC with Differential/Platelet  7. Medication management  - CBC with Differential/Platelet - COMPLETE METABOLIC PANEL WITH GFR - Magnesium - Lipid panel - TSH - Hemoglobin A1 - Insulin, random - VITAMIN D 25 Hydroxy         Patient strongly a          Discussed  regular exercise, BP monitoring, weight control to achieve/maintain BMI less than 25 and discussed med and SE's. Recommended labs to assess and monitor clinical status with further disposition pending results of labs.  I discussed the assessment and treatment plan with the patient. The patient was provided an opportunity to ask questions and all were answered. The patient agreed with the plan and demonstrated an understanding of the instructions.  I provided over 30 minutes of exam, counseling, chart review and  complex critical decision making.       The patient was advised to call back or seek an in-person evaluation if the symptoms worsen or if the condition fails to improve as anticipated.   Marinus Maw, MD

## 2023-03-06 ENCOUNTER — Ambulatory Visit: Payer: Managed Care, Other (non HMO) | Admitting: Internal Medicine

## 2023-03-06 ENCOUNTER — Encounter: Payer: Self-pay | Admitting: Internal Medicine

## 2023-03-06 VITALS — BP 164/80 | HR 76 | Temp 98.0°F | Resp 16 | Ht 67.5 in | Wt 209.2 lb

## 2023-03-06 DIAGNOSIS — I700 Atherosclerosis of aorta: Secondary | ICD-10-CM

## 2023-03-06 DIAGNOSIS — I10 Essential (primary) hypertension: Secondary | ICD-10-CM

## 2023-03-06 DIAGNOSIS — N182 Chronic kidney disease, stage 2 (mild): Secondary | ICD-10-CM

## 2023-03-06 DIAGNOSIS — E785 Hyperlipidemia, unspecified: Secondary | ICD-10-CM

## 2023-03-06 DIAGNOSIS — Z79899 Other long term (current) drug therapy: Secondary | ICD-10-CM | POA: Diagnosis not present

## 2023-03-06 DIAGNOSIS — E1169 Type 2 diabetes mellitus with other specified complication: Secondary | ICD-10-CM

## 2023-03-06 DIAGNOSIS — E1122 Type 2 diabetes mellitus with diabetic chronic kidney disease: Secondary | ICD-10-CM | POA: Diagnosis not present

## 2023-03-06 DIAGNOSIS — K2100 Gastro-esophageal reflux disease with esophagitis, without bleeding: Secondary | ICD-10-CM | POA: Diagnosis not present

## 2023-03-06 DIAGNOSIS — E559 Vitamin D deficiency, unspecified: Secondary | ICD-10-CM

## 2023-03-07 NOTE — Progress Notes (Signed)
^<^<^<^<^<^<^<^<^<^<^<^<^<^<^<^<^<^<^<^<^<^<^<^<^<^<^<^<^<^<^<^<^<^<^<^<^ ^>^>^>^>^>^>^>^>^>^>^>>^>^>^>^>^>^>^>^>^>^>^>^>^>^>^>^>^>^>^>^>^>^>^>^>^>  -Test results slightly outside the reference range are not unusual. If there is anything important, I will review this with you,  otherwise it is considered normal test values.  If you have further questions,  please do not hesitate to contact me at the office or via My Chart.   ^<^<^<^<^<^<^<^<^<^<^<^<^<^<^<^<^<^<^<^<^<^<^<^<^<^<^<^<^<^<^<^<^<^<^<^<^ ^>^>^>^>^>^>^>^>^>^>^>^>^>^>^>^>^>^>^>^>^>^>^>^>^>^>^>^>^>^>^>^>^>^>^>^>^  - Glucose = 158 mg%  &  A1c = 8.3%  - Both way too high  - Need a much stricter diet  ^<^<^<^<^<^<^<^<^<^<^<^<^<^<^<^<^<^<^<^<^<^<^<^<^<^<^<^<^<^<^<^<^<^<^<^<^ ^>^>^>^>^>^>^>^>^>^>^>^>^>^>^>^>^>^>^>^>^>^>^>^>^>^>^>^>^>^>^>^>^>^>^>^>^  -   Magnesium = 1.6  is very low very  low- goal is betw 2.0 - 2.5,   - So..............Marland Kitchen  Recommend that you take                                        Magnesium 500 mg tablet 3 x /day with Meals   !  - also important to eat lots of  leafy green vegetables   - spinach - Kale - collards - greens - okra - asparagus - broccoli - quinoa   - squash - almonds  - black, red, white beans   -  peas - green beans  ^<^<^<^<^<^<^<^<^<^<^<^<^<^<^<^<^<^<^<^<^<^<^<^<^<^<^<^<^<^<^<^<^<^<^<^<^ ^>^>^>^>^>^>^>^>^>^>^>^>^>^>^>^>^>^>^>^>^>^>^>^>^>^>^>^>^>^>^>^>^>^>^>^>^  - Thyroid is Normal & OK   ^<^<^<^<^<^<^<^<^<^<^<^<^<^<^<^<^<^<^<^<^<^<^<^<^<^<^<^<^<^<^<^<^<^<^<^<^ ^>^>^>^>^>^>^>^>^>^>^>^>^>^>^>^>^>^>^>^>^>^>^>^>^>^>^>^>^>^>^>^>^>^>^>^>^  -  Vitamin D = 44   is very low   - Vitamin D goal is between 70-100.   - Recommend that you take                                   Vitamin D 5,000 unit capsules  x 2 capsules = 10,000 units every day.    - It is very important as a natural anti-inflammatory and helping the                                          immune system protect against viral  infections, like the Covid-19    helping hair, skin, and nails, as well as reducing stroke and heart attack risk.   - It helps your bones and helps with mood.  - It also decreases numerous cancer risks so please                                                                                           take it as directed.   - Low Vit D is associated with a 200-300% higher risk for CANCER   and 200-300% higher risk for HEART   ATTACK  &  STROKE.    - It is also associated with higher death rate at younger ages,   autoimmune diseases like Rheumatoid arthritis, Lupus, Multiple Sclerosis.     - Also many other serious conditions, like depression, Alzheimer's  Dementia,  muscle aches, fatigue, fibromyalgia   ^<^<^<^<^<^<^<^<^<^<^<^<^<^<^<^<^<^<^<^<^<^<^<^<^<^<^<^<^<^<^<^<^<^<^<^<^ ^>^>^>^>^>^>^>^>^>^>^>^>^>^>^>^>^>^>^>^>^>^>^>^>^>^>^>^>^>^>^>^>^>^>^>^>^  -  All Else - CBC -  Electrolytes - Liver - Magnesium & Thyroid    -  all  Normal / OK  ^<^<^<^<^<^<^<^<^<^<^<^<^<^<^<^<^<^<^<^<^<^<^<^<^<^<^<^<^<^<^<^<^<^<^<^<^ ^>^>^>^>^>^>^>^>^>^>^>^>^>^>^>^>^>^>^>^>^>^>^>^>^>^>^>^>^>^>^>^>^>^>^>^>^

## 2023-03-12 ENCOUNTER — Other Ambulatory Visit: Payer: Self-pay

## 2023-03-12 DIAGNOSIS — E1122 Type 2 diabetes mellitus with diabetic chronic kidney disease: Secondary | ICD-10-CM

## 2023-03-12 DIAGNOSIS — N182 Chronic kidney disease, stage 2 (mild): Secondary | ICD-10-CM

## 2023-03-12 MED ORDER — ONETOUCH VERIO VI STRP: ORAL_STRIP | 12 refills | Status: AC

## 2023-03-12 NOTE — Progress Notes (Signed)
Future Appointments  Date Time Provider Department  03/13/2023  1:45 PM Lucky Cowboy, MD [facility]  04/23/2023  8:30 AM Dohmeier, Porfirio Mylar, MD [facility]  10/15/2023 11:00 AM Lucky Cowboy, MD [facility]    History of Present Illness:          [name]  with HTN, HLD, T2_NIDDM and Vitamin D Deficiency  returns for 1 week follow-up.       On August 1 , he went to ER at [facility] in [place] for a swollen RLE & Doppler U/S  ruled out a DVT  and found   Baker's Cyst . He was apparently suspected to have a cellulitis of the RLE & was treated with a 7 day  course of Keflex.    He was seen here on 8/13 one week ago with asymetric edema of the RLE  and strongly advised strict bedrest with leg elevation and ACE wraps to the RLE for compression. Patient returns today feeling improved and ready to RTW.  Also in the meantime he did see Dr Lequita Halt & had the Baker's popliteal cyst aspirated and was advised TKA is forthcoming.  Last A1c 8.3% on 03/06/2023.    Current Outpatient Medications on File Prior to Visit  Medication Sig   acetaminophen (TYLENOL) 500 MG tablet Take 1,000 mg by mouth every 6 (six) hours as needed   amLODipine (NORVASC) 10 MG tablet TAKE 1/2 TO 1 TABLET  DAILY    atenolol (TENORMIN) 100 MG tablet Take  1 tablet  Daily    VITAMIN D 50,000 u Take    famotidine (PEPCID) 40 MG tablet TAKE 1 TABLET AT BEDTIME    glyBURIDE 5 MG tablet Take 1/2 to 1 tablet  3 x /day  with Meals for Diabetes   hydrochlorothiazide  25 MG tablet TAKE 1 TABLET(25 MG) BY MOUTH DAILY   hyoscyamine (LEVSIN) 0.125 MG tablet Take  1-2 tablets  3-4 x /day  every 4-6 hours as needed   leflunomide (ARAVA) 10 MG tablet Take 10 mg by mouth daily.   Loperamide HCl (IMODIUM PO) Take prn   meclizine (ANTIVERT) 25 MG tablet Take  3 times daily as needed for dizziness.   metFORMIN-XR 500 MG  TAKE 1 TO 2 TABLETS  TWICE DAILY    ondansetron 8 MG tablet Take 1/2 to 1 tablet 3 x day if needed  for Nausea   tamsulosin  0.4 MG CAPS capsule TAKE 1 CAPSULE  AT BEDTIME FOR PROSTATE   TRULICITY 0.75 MG/0.5ML SOPN ADMINISTER 0.75 MG UNDER THE SKIN 1 TIME A WEEK   Zinc 50 MG TABS Take daily     Allergies  Allergen Reactions   Ace Inhibitors Other (See Comments)    Unknown   Ibuprofen Other (See Comments)    GI upset   Invokana [Canagliflozin] Other (See Comments)    Unknown   Quinidine Other (See Comments)    Unknown     Problem list He has Hypertension; Type 2 diabetes mellitus (HCC); GERD (gastroesophageal reflux disease); Testosterone Deficiency; OSA (obstructive sleep apnea); Vitamin D deficiency; BPH (benign prostatic hyperplasia); Hyperlipidemia associated with type 2 diabetes mellitus (HCC); Medication management; Obesity (BMI 30.0-34.9); Poor compliance with Diet ; Gluttony; CKD stage 3 due to type 2 diabetes mellitus (HCC); AKI (acute kidney injury) (HCC); Renal calculi; Hepatic steatosis; Hydroureteronephrosis; Aortic atherosclerosis (HCC) by Abd CT scan on 09/19/2019; OA (osteoarthritis) of knee; Primary osteoarthritis of left knee; Chronic intermittent hypoxia with  obstructive sleep apnea; Encounter for Department of Transportation (DOT) examination for driving license renewal; Excessive daytime sleepiness; Arthritis associated with secondary diabetes (HCC); and Shift work sleep disorder on their problem list.   Observations/Objective:  BP (!) 158/80   Pulse 85   Temp 97.9 F (36.6 C)   Resp 16   Ht 5' 7.5" (1.715 m)   Wt 205 lb (93 kg)   SpO2 96%   BMI 31.63 kg/m   HEENT - WNL. Neck - supple.  Chest - Clear equal BS. Cor - Nl HS. RRR w/o sig MGR. PP 1(+). Slight  edema to the Rt>Lt . MS- FROM w/o deformities.  Gait Nl. Neuro -  Nl w/o focal abnormalities.  Assessment and Plan:   1. Baker's cyst of knee, right  Follow Up Instructions:        I discussed the assessment and treatment plan with the patient. The patient was provided an opportunity to ask  questions and all were answered. The patient agreed with the plan and demonstrated an understanding of the instructions.       The patient was advised to call back or seek an in-person evaluation if the symptoms worsen or if the condition fails to improve as anticipated.    Marinus Maw, MD

## 2023-03-13 ENCOUNTER — Encounter: Payer: Self-pay | Admitting: Internal Medicine

## 2023-03-13 ENCOUNTER — Ambulatory Visit: Payer: Managed Care, Other (non HMO) | Admitting: Internal Medicine

## 2023-03-13 VITALS — BP 158/80 | HR 85 | Temp 97.9°F | Resp 16 | Ht 67.5 in | Wt 205.0 lb

## 2023-03-13 DIAGNOSIS — M7121 Synovial cyst of popliteal space [Baker], right knee: Secondary | ICD-10-CM | POA: Diagnosis not present

## 2023-03-29 ENCOUNTER — Telehealth: Payer: Self-pay

## 2023-03-29 NOTE — Telephone Encounter (Signed)
Prior auth approved through 03/28/24   Prior auth completed and submitted.

## 2023-03-30 ENCOUNTER — Other Ambulatory Visit: Payer: Self-pay | Admitting: Internal Medicine

## 2023-03-30 DIAGNOSIS — E1122 Type 2 diabetes mellitus with diabetic chronic kidney disease: Secondary | ICD-10-CM

## 2023-04-03 ENCOUNTER — Telehealth: Payer: Self-pay

## 2023-04-03 NOTE — Telephone Encounter (Signed)
Patient's  wife called to see what else Phillip Powell could do to try to get his A1C down so he can have surgery. Dr. Oneta Rack suggested Ceylon cinnamon 9,000mg  2 capsules BID  and apple cider vinegar with the mother in it, 1Tbs in a liquid BID.  He has a Nurse visit to recheck A1C at the end of October to see if this is helping along with diet change.

## 2023-04-09 ENCOUNTER — Other Ambulatory Visit: Payer: Self-pay | Admitting: Nurse Practitioner

## 2023-04-09 DIAGNOSIS — I10 Essential (primary) hypertension: Secondary | ICD-10-CM

## 2023-04-16 ENCOUNTER — Ambulatory Visit: Payer: Managed Care, Other (non HMO) | Admitting: Nurse Practitioner

## 2023-04-23 ENCOUNTER — Encounter: Payer: Self-pay | Admitting: Neurology

## 2023-04-23 ENCOUNTER — Ambulatory Visit: Payer: Managed Care, Other (non HMO) | Admitting: Neurology

## 2023-04-23 VITALS — BP 127/68 | HR 74 | Ht 68.0 in | Wt 215.0 lb

## 2023-04-23 DIAGNOSIS — G4733 Obstructive sleep apnea (adult) (pediatric): Secondary | ICD-10-CM | POA: Diagnosis not present

## 2023-04-23 DIAGNOSIS — G4719 Other hypersomnia: Secondary | ICD-10-CM | POA: Diagnosis not present

## 2023-04-23 DIAGNOSIS — G4734 Idiopathic sleep related nonobstructive alveolar hypoventilation: Secondary | ICD-10-CM | POA: Diagnosis not present

## 2023-04-23 NOTE — Patient Instructions (Addendum)
In 6 months RV with NP or me. Bring machine

## 2023-04-23 NOTE — Progress Notes (Signed)
Provider:  Melvyn Novas, MD  Primary Care Physician:  Lucky Cowboy, MD [address] [place] [address]     Referring Provider: PCP Lucky Cowboy, Md [address] [place],  [place] [address]          Chief Complaint according to patient   Patient presents with:                HISTORY OF PRESENT ILLNESS:  Phillip Powell is a 77 y.o. male patient who is here for revisit 04/23/2023 for  CPAP follow up, new machine issued following an in lab titration PSG  01-14-2023.   Chief concern according to patient :  : "works just as well as the old one".  Mr. Wager's obstructive sleep apnea was still severe with a baseline AHI of 55.5 so his study became a split-night polysomnography after 2 hours.  He was titrated to CPAP beginning at 5 and ending at a final pressure of 16 cm water was 2 cm EPR.  Severe sleep apnea was controlled under a pressure of 13 cm as well as under the highest explored pressure at 16 cm the residual AHI during his lab titration was 5.8.  He is using a large AirFit F10 full facemask produced by [employer]. His wife feels he is breathing loudly, sometimes snoring.  His residual AHI may improve with a higher EPR ?  He has been 100% compliant by days and 90% compliant by hours with an average of 6 hours 39 minutes.  The 95th percentile air leak is 8.5 L which is low and for a full facemask this is a really low residual air leak. So we decided today to bring up his expiratory pressure relief to 3 cm water the maximum pressure to 20 cm water the minimum pressure to 6 cm water.  I would be happy with an AHI under 10 I would be elated if he would find an AHI under 5.  The residual apneas as scored here are still obstructive in origin- not CENTRAL.  The patient's Epworth sleepiness scale was today endorsed at 9 out of 24 points fatigue severity 39 out of 63 points in the so-called geriatric depression score 3 out of 15  points.      LEXUS BARLETTA is a 77 y.o. male patient who is seen upon referral on 10/23/2022 from Dr Oneta Rack, MD ,  for a Sleep consultation. 10-23-2022. Marland Kitchen  Chief concern according to patient :  Patient had his first and last PSG/ HST in  12/ 2015 and received a CPAP in 07-2024, at [facility] Auto CPAP.     DMETRIUS AMBS is seen as a NEW PATIENT CONSULT on 10/23/22 , here with his wife ,a right-handed male with OSA. His baseline study at [facility] was 9 12-20. AHI 11/h at baseline then and with 405 minutes in hypoxia ! . He was titrated by auto machine, didn't return to the lab.    Sleep relevant medical history: history of sarcoidosis, Dr. Maple Hudson. No Nocturia, 1-2, no Sleep walking, had a Tonsillectomy, deviated septum, biological teeth.  coughing with allergies. Severe COVID 2020 , in hospital.     Review of Systems: Out of a complete 14 system review, the patient complains of only the following symptoms, and all other reviewed systems are negative.:  Fatigue, sleepiness , snoring, fragmented sleep,    How likely are you to doze in the following  situations: 0 = not likely, 1 = slight chance, 2 = moderate chance, 3 = high chance   Sitting and Reading? Watching Television? Sitting inactive in a public place (theater or meeting)? As a passenger in a car for an hour without a break? Lying down in the afternoon when circumstances permit? Sitting and talking to someone? Sitting quietly after lunch without alcohol? In a car, while stopped for a few minutes in traffic?     The patient's Epworth sleepiness scale was today endorsed at 9 out of 24 points fatigue severity 39 out of 63 points in the so-called geriatric depression score 3 out of 15 points.  Social History   Socioeconomic History   Marital status: Married    Spouse name: Not on file   Number of children: Not on file   Years of education: Not on file   Highest education level: Not on file  Occupational History    Not on file  Tobacco Use   Smoking status: Never   Smokeless tobacco: Never  Vaping Use   Vaping status: Never Used  Substance and Sexual Activity   Alcohol use: Yes    Comment: very rarely   Drug use: Never   Sexual activity: Not on file  Other Topics Concern   Not on file  Social History Narrative   Not on file   Social Determinants of Health   Financial Resource Strain: Not on file  Food Insecurity: Not on file  Transportation Needs: Not on file  Physical Activity: Not on file  Stress: Not on file  Social Connections: Not on file    Family History  Problem Relation Age of Onset   Thyroid disease Father    Cancer Father        lung   Cancer Mother        throat   Diabetes Mother    Thyroid disease Sister    Hypertension Son    Hyperlipidemia Son    Cancer Paternal Grandmother        colon    Past Medical History:  Diagnosis Date    Covid-19 Positive (02/06/2019)  02/11/2019   Arthritis    BPH (benign prostatic hypertrophy)    Diabetes mellitus without complication (HCC)    type w    GERD (gastroesophageal reflux disease)    Hepatic steatosis    History of kidney stones    Hydroureteronephrosis    Hypertension    Hypogonadism male    Mass    left shoulder AC joint   OSA (obstructive sleep apnea)    wears CPAP wears 4 hours per nite    PONV (postoperative nausea and vomiting)    Renal calculi    Sarcoidosis    Vitamin D deficiency     Past Surgical History:  Procedure Laterality Date   APPENDECTOMY     COLONOSCOPY     CYSTOSCOPY/URETEROSCOPY/HOLMIUM LASER/STENT PLACEMENT Right 10/01/2019   Procedure: CYSTOSCOPY/RETROGRADE/URETEROSCOPY/HOLMIUM LASER/STENT PLACEMENT;  Surgeon: Rene Paci, MD;  Location: [facility];  Service: Urology;  Laterality: Right;  ONLY NEEDS 45 MIN   HAND SURGERY Right 2019   Dr. Amanda Pea, tendon repair of R thumb   HERNIA REPAIR  2004   umbilical   KNEE ARTHROSCOPY     ORIF CLAVICULAR FRACTURE Left 03/29/2018    Procedure: LEFT SHOULDER OPEN DISTAL CLAVICLE RESECTION, CYST EXCISION;  Surgeon: Beverely Low, MD;  Location: [facility] OR;  Service: Orthopedics;  Laterality: Left;   ROTATOR CUFF REPAIR Left 1996  ROTATOR CUFF REPAIR Right 1997   TONSILLECTOMY  1952   TOTAL KNEE ARTHROPLASTY Left 11/22/2020   Procedure: TOTAL KNEE ARTHROPLASTY;  Surgeon: Ollen Gross, MD;  Location: [facility];  Service: Orthopedics;  Laterality: Left;     Current Outpatient Medications on File Prior to Visit  Medication Sig Dispense Refill   acetaminophen (TYLENOL) 500 MG tablet Take 1,000 mg by mouth every 6 (six) hours as needed for moderate pain.     amLODipine (NORVASC) 10 MG tablet TAKE 1/2 TO 1 TABLET BY MOUTH DAILY FOR BLOOD PRESSURE 90 tablet 1   atenolol (TENORMIN) 100 MG tablet Take  1 tablet  Daily for BP                                                                                                          /                                                                                    TAKE                                                                  BY                                                   MOUTH 90 tablet 3   B-D 3CC LUER-LOK SYR 21GX1" 21G X 1" 3 ML MISC use as directed 12 each 1   blood glucose meter kit and supplies KIT Dispense based on  insurance coverage. Test blood sugar twice daily. #100 lancet and strips with 3 refills 1 each 0   Blood Glucose Monitoring Suppl (ONETOUCH VERIO) w/Device KIT Test blood sugar once daily. 1 kit 0   Blood Glucose Monitoring Suppl DEVI Use meter three times a day to check blood sugar 1 each 0   cephALEXin (KEFLEX) 500 MG capsule Take 500 mg by mouth 4 (four) times daily.     Cholecalciferol (VITAMIN D3) 1.25 MG (50000 UT) TABS Take by mouth.     famotidine (PEPCID) 40 MG tablet TAKE 1 TABLET BY MOUTH AT BEDTIME FOR INDIGESTION OR ACID REFLUX 90 tablet 3   glucose blood (ONETOUCH VERIO) test strip Test sugars 3 x a day for hypoglycemia/hyperglycemia and fluctuating  sugars 100 each 12   glyBURIDE (DIABETA) 5 MG tablet Take 1/2 to 1  tablet  3 x /day  with Meals for Diabetes 270 tablet 0   hydrochlorothiazide (HYDRODIURIL) 25 MG tablet TAKE 1 TABLET(25 MG) BY MOUTH DAILY 30 tablet 3   hyoscyamine (LEVSIN) 0.125 MG tablet Take  1 to 2 tablets  3 to 4 x /day  every 4 to 6 hours as needed for Nausia, Cramping, Bloating or Diarrhea 120 tablet 0   Lancets (ONETOUCH ULTRASOFT) lancets Test blood sugar once daily 100 each 12   leflunomide (ARAVA) 10 MG tablet Take 10 mg by mouth daily.     Loperamide HCl (IMODIUM PO) Take by mouth.     meclizine (ANTIVERT) 25 MG tablet Take 25 mg by mouth 3 (three) times daily as needed for dizziness.     metFORMIN (GLUCOPHAGE-XR) 500 MG 24 hr tablet TAKE 1 TO 2 TABLETS BY MOUTH TWICE DAILY WITH MEALS FOR DIABETES 360 tablet 3   ondansetron (ZOFRAN) 8 MG tablet Take 1/2 to 1 tablet 3 x day if needed for Nausea 30 tablet 1   tamsulosin (FLOMAX) 0.4 MG CAPS capsule TAKE 1 CAPSULE BY MOUTH AT BEDTIME FOR PROSTATE 90 capsule 3   TRULICITY 0.75 MG/0.5ML SOPN ADMINISTER 0.75 MG UNDER THE SKIN 1 TIME A WEEK 2 mL 3   Zinc 50 MG TABS Take by mouth.     No current facility-administered medications on file prior to visit.    Allergies  Allergen Reactions   Ace Inhibitors Other (See Comments)    Unknown   Ibuprofen Other (See Comments)    GI upset   Invokana [Canagliflozin] Other (See Comments)    Unknown   Quinidine Other (See Comments)    Unknown     DIAGNOSTIC DATA (LABS, IMAGING, TESTING) - I reviewed patient records, labs, notes, testing and imaging myself where available.  Lab Results  Component Value Date   WBC 7.8 03/06/2023   HGB 14.4 03/06/2023   HCT 44.9 03/06/2023   MCV 81.6 03/06/2023   PLT 206 03/06/2023      Component Value Date/Time   NA 139 03/06/2023 1114   K 3.6 03/06/2023 1114   CL 97 (L) 03/06/2023 1114   CO2 33 (H) 03/06/2023 1114   GLUCOSE 158 (H) 03/06/2023 1114   BUN 21 03/06/2023 1114    CREATININE 0.91 03/06/2023 1114   CALCIUM 9.7 03/06/2023 1114   PROT 6.7 03/06/2023 1114   ALBUMIN 3.3 (L) 11/22/2020 1020   AST 12 03/06/2023 1114   ALT 13 03/06/2023 1114   ALKPHOS 58 11/22/2020 1020   BILITOT 0.4 03/06/2023 1114   GFRNONAA >60 11/23/2020 0307   GFRNONAA 75 10/18/2020 0914   GFRAA 87 10/18/2020 0914   Lab Results  Component Value Date   CHOL 182 03/06/2023   HDL 45 03/06/2023   LDLCALC 98 03/06/2023   TRIG 293 (H) 03/06/2023   CHOLHDL 4.0 03/06/2023   Lab Results  Component Value Date   HGBA1C 8.3 (H) 03/06/2023   Lab Results  Component Value Date   VITAMINB12 357 02/23/2020   Lab Results  Component Value Date   TSH 1.38 03/06/2023    PHYSICAL EXAM:  Today's Vitals   04/23/23 0815  BP: 127/68  Pulse: 74  Weight: 215 lb (97.5 kg)  Height: 5\' 8"  (1.727 m)   Body mass index is 32.69 kg/m.   Wt Readings from Last 3 Encounters:  04/23/23 215 lb (97.5 kg)  03/13/23 205 lb (93 kg)  03/06/23 209 lb 3.2 oz (94.9 kg)     Ht  Readings from Last 3 Encounters:  04/23/23 5\' 8"  (1.727 m)  03/13/23 5' 7.5" (1.715 m)  03/06/23 5' 7.5" (1.715 m)      General: TThe patient is awake, alert and appears not in acute distress. The patient is well groomed. Head: Normocephalic, atraumatic. Neck is supple.  Mallampati  3 plus ,  neck circumference:17. 75 inches .  Nasal airflow  patent.  Retrognathia is not seen.  Dental status:  biology Cardiovascular:  Regular rate and cardiac rhythm by pulse,  without distended neck veins. Respiratory: Lungs are clear to auscultation.  Skin:  Without evidence of ankle edema, or rash.facial redness.  Trunk: The patient's posture is slightly stooped.  NEUROLOGIC EXAM: The patient is awake and alert, oriented to place and time.   Memory subjective described as intact.  Attention span & concentration ability appears normal.  Speech is fluent,  without  dysarthria, dysphonia or aphasia.  Mood and affect are appropriate.    Cranial nerves: no loss of smell or taste reported  Pupils are equal and briskly reactive to light. Funduscopic exam deferred. .  Extraocular movements in vertical and horizontal planes were intact and without nystagmus. No Diplopia. Visual fields by finger perimetry are intact. Hearing was intact to soft voice and finger rubbing.    Facial sensation intact to fine touch.  Facial motor strength is symmetric and tongue and uvula move midline.  Neck ROM : rotation, tilt and flexion extension were normal for age and shoulder shrug was symmetrical.    Motor exam:  Symmetric bulk, tone and ROM.   Normal tone without cog wheeling, symmetric grip strength .   Sensory:  Fine touch and vibration were reduced in both feet and ankles.  Proprioception tested in the upper extremities was normal.   Coordination: Rapid alternating movements in the fingers/hands were of normal speed.  The Finger-to-nose maneuver was intact without evidence of ataxia, dysmetria or tremor.    ASSESSMENT AND PLAN 77 y.o. year old male  here with:    1)  severe OSA on CPAP with unsatisfying  apnea ,residual of 10 or more. Will change the EPR to 3 cm and will change the max pressure  to 18 cm water. DME ordered.     Rv in 6 months with NP .    I plan to follow up through our NP within 12 months.   I would like to thank Lucky Cowboy, [place] [address] [place],  [place] [address] for allowing me to meet with and to take care of this pleasant patient.     After spending a total time of  23  minutes face to face and additional time for physical and neurologic examination, review of laboratory studies,  personal review of imaging studies, reports and results of other testing and review of referral information / records as far as provided in visit,   Electronically signed by: Melvyn Novas, MD 04/23/2023 8:35 AM  [facility] and [facility] Sleep Board  certified by The [employer] of [employer] Medicine and Diplomate of the [employer] of [employer] Medicine. Board certified In Neurology through the ABPN, Fellow of the [employer] of [employer].

## 2023-05-10 ENCOUNTER — Other Ambulatory Visit: Payer: Self-pay | Admitting: Nurse Practitioner

## 2023-05-10 DIAGNOSIS — E1122 Type 2 diabetes mellitus with diabetic chronic kidney disease: Secondary | ICD-10-CM

## 2023-05-16 ENCOUNTER — Other Ambulatory Visit: Payer: Self-pay | Admitting: Nurse Practitioner

## 2023-05-21 LAB — HM DIABETES EYE EXAM

## 2023-05-28 ENCOUNTER — Ambulatory Visit (INDEPENDENT_AMBULATORY_CARE_PROVIDER_SITE_OTHER): Payer: Managed Care, Other (non HMO)

## 2023-05-28 VITALS — BP 127/68 | HR 80 | Temp 98.0°F | Resp 16 | Ht 68.0 in | Wt 216.6 lb

## 2023-05-28 DIAGNOSIS — Z23 Encounter for immunization: Secondary | ICD-10-CM

## 2023-05-29 ENCOUNTER — Encounter: Payer: Self-pay | Admitting: Internal Medicine

## 2023-05-29 LAB — HEMOGLOBIN A1C
Hgb A1c MFr Bld: 7.6 %{Hb} — ABNORMAL HIGH (ref <5.7)
Mean Plasma Glucose: 171 mg/dL
eAG (mmol/L): 9.5 mmol/L

## 2023-05-29 NOTE — Progress Notes (Signed)
<>*<>*<>*<>*<>*<>*<>*<>*<>*<>*<>*<>*<>*<>*<>*<>*<>*<>*<>*<>*<>*<>*<>*<>*<> <>*<>*<>*<>*<>*<>*<>*<>*<>*<>*<>*<>*<>*<>*<>*<>*<>*<>*<>*<>*<>*<>*<>*<>*<>  -   A1c down to 7.6%  and meets the criteria to proceed with surgery   <>*<>*<>*<>*<>*<>*<>*<>*<>*<>*<>*<>*<>*<>*<>*<>*<>*<>*<>*<>*<>*<>*<>*<>*<> <>*<>*<>*<>*<>*<>*<>*<>*<>*<>*<>*<>*<>*<>*<>*<>*<>*<>*<>*<>*<>*<>*<>*<>*<>

## 2023-06-04 NOTE — H&P (Addendum)
TOTAL KNEE ADMISSION H&P  Patient is being admitted for right total knee arthroplasty.  Subjective:  Chief Complaint: Right knee pain.  HPI: Phillip Powell., 77 y.o. male has a history of pain and functional disability in the right knee due to arthritis and has failed non-surgical conservative treatments for greater than 12 weeks to include corticosteriod injections, viscosupplementation injections, and activity modification. Onset of symptoms was gradual, starting  tricompartmental bone-on-bone change with large osteophyte formation  years ago with gradually worsening course since that time. The patient noted no past surgery on the right knee.  Patient currently rates pain in the right knee at 7 out of 10 with activity. Patient has night pain, worsening of pain with activity and weight bearing, pain with passive range of motion, and crepitus. Patient has evidence of  tricompartmental bone-on-bone change with large osteophyte formation  by imaging studies. There is no active infection.  Patient Active Problem List   Diagnosis Date Noted   Chronic intermittent hypoxia with obstructive sleep apnea 10/23/2022   Encounter for Department of Transportation (DOT) examination for driving license renewal 84/13/2440   Excessive daytime sleepiness 10/23/2022   Arthritis associated with secondary diabetes (HCC) 10/23/2022   Shift work sleep disorder 10/23/2022   OA (osteoarthritis) of knee 11/22/2020   Primary osteoarthritis of left knee 11/22/2020   Aortic atherosclerosis (HCC) by Abd CT scan on 09/19/2019 07/10/2020   Renal calculi    Hepatic steatosis    Hydroureteronephrosis    AKI (acute kidney injury) (HCC) 02/12/2019   CKD stage 3 due to type 2 diabetes mellitus (HCC) 06/10/2017   Gluttony 11/26/2016   Poor compliance with Diet  10/05/2014   Obesity (BMI 30.0-34.9) 03/02/2014   Hyperlipidemia associated with type 2 diabetes mellitus (HCC) 08/18/2013   Medication management 08/18/2013    Hypertension    Type 2 diabetes mellitus (HCC)    GERD (gastroesophageal reflux disease)    Testosterone Deficiency    OSA (obstructive sleep apnea)    Vitamin D deficiency    BPH (benign prostatic hyperplasia)     Past Medical History:  Diagnosis Date    Covid-19 Positive (02/06/2019)  02/11/2019   Arthritis    BPH (benign prostatic hypertrophy)    Diabetes mellitus without complication (HCC)    type w    GERD (gastroesophageal reflux disease)    Hepatic steatosis    History of kidney stones    Hydroureteronephrosis    Hypertension    Hypogonadism male    Mass    left shoulder AC joint   OSA (obstructive sleep apnea)    wears CPAP wears 4 hours per nite    PONV (postoperative nausea and vomiting)    Renal calculi    Sarcoidosis    Vitamin D deficiency     Past Surgical History:  Procedure Laterality Date   APPENDECTOMY     COLONOSCOPY     CYSTOSCOPY/URETEROSCOPY/HOLMIUM LASER/STENT PLACEMENT Right 10/01/2019   Procedure: CYSTOSCOPY/RETROGRADE/URETEROSCOPY/HOLMIUM LASER/STENT PLACEMENT;  Surgeon: Rene Paci, MD;  Location: [facility];  Service: Urology;  Laterality: Right;  ONLY NEEDS 45 MIN   HAND SURGERY Right 2019   Dr. Amanda Pea, tendon repair of R thumb   HERNIA REPAIR  2004   umbilical   KNEE ARTHROSCOPY     ORIF CLAVICULAR FRACTURE Left 03/29/2018   Procedure: LEFT SHOULDER OPEN DISTAL CLAVICLE RESECTION, CYST EXCISION;  Surgeon: Beverely Low, MD;  Location: [facility];  Service: Orthopedics;  Laterality: Left;   ROTATOR CUFF REPAIR Left  1996   ROTATOR CUFF REPAIR Right 1997   TONSILLECTOMY  1952   TOTAL KNEE ARTHROPLASTY Left 11/22/2020   Procedure: TOTAL KNEE ARTHROPLASTY;  Surgeon: Ollen Gross, MD;  Location: [facility];  Service: Orthopedics;  Laterality: Left;    Prior to Admission medications   Medication Sig Start Date End Date Taking? Authorizing Provider  acetaminophen (TYLENOL) 500 MG tablet Take 1,000 mg by mouth every 6 (six) hours as needed for  moderate pain.    [provider]  amLODipine (NORVASC) 10 MG tablet TAKE 1/2 TO 1 TABLET BY MOUTH DAILY FOR BLOOD PRESSURE 05/17/23   Raynelle Dick, NP  atenolol (TENORMIN) 100 MG tablet Take  1 tablet  Daily for BP                                                                                                          /                                                                                    TAKE                                                                  BY                                                   MOUTH 11/11/22   Lucky Cowboy, MD  B-D 3CC LUER-LOK SYR 21GX1" 21G X 1" 3 ML MISC use as directed 10/17/16   Lucky Cowboy, MD  blood glucose meter kit and supplies KIT Dispense based on  insurance coverage. Test blood sugar twice daily. #100 lancet and strips with 3 refills 02/28/22   Raynelle Dick, NP  Blood Glucose Monitoring Suppl (ONETOUCH VERIO) w/Device KIT Test blood sugar once daily. 03/03/19   Judd Gaudier, NP  Blood Glucose Monitoring Suppl DEVI Use meter three times a day to check blood sugar 05/20/18   Judd Gaudier, NP  cephALEXin (KEFLEX) 500 MG capsule Take 500 mg by mouth 4 (four) times daily. 02/22/23   [provider]  Cholecalciferol (VITAMIN D3) 1.25 MG (50000 UT) TABS Take by mouth.    [provider]  famotidine (PEPCID) 40 MG tablet TAKE 1 TABLET BY MOUTH AT BEDTIME FOR INDIGESTION OR ACID REFLUX 01/05/22   Judd Gaudier, NP  glucose blood [facility]  VERIO) test strip Test sugars 3 x a day for hypoglycemia/hyperglycemia and fluctuating sugars 03/12/23   Lucky Cowboy, MD  glyBURIDE (DIABETA) 5 MG tablet Take 1/2 to 1 tablet  3 x /day  with Meals for Diabetes 09/04/22   Lucky Cowboy, MD  hydrochlorothiazide (HYDRODIURIL) 25 MG tablet TAKE 1 TABLET(25 MG) BY MOUTH DAILY 04/09/23   Adela Glimpse, NP  hyoscyamine (LEVSIN) 0.125 MG tablet Take  1 to 2 tablets  3 to 4 x /day  every 4 to 6 hours as needed for Nausia,  Cramping, Bloating or Diarrhea 09/25/22   Lucky Cowboy, MD  Lancets [facility] ULTRASOFT) lancets Test blood sugar once daily 03/03/19   Judd Gaudier, NP  leflunomide (ARAVA) 10 MG tablet Take 10 mg by mouth daily.    [provider]  Loperamide HCl (IMODIUM PO) Take by mouth.    [provider]  meclizine (ANTIVERT) 25 MG tablet Take 25 mg by mouth 3 (three) times daily as needed for dizziness.    [provider]  metFORMIN (GLUCOPHAGE-XR) 500 MG 24 hr tablet TAKE 1 TO 2 TABLETS BY MOUTH TWICE DAILY WITH MEALS FOR DIABETES 07/13/22   Raynelle Dick, NP  ondansetron [facility]) 8 MG tablet Take 1/2 to 1 tablet 3 x day if needed for Nausea 02/16/20   Doree Albee, PA-C  tamsulosin (FLOMAX) 0.4 MG CAPS capsule TAKE 1 CAPSULE BY MOUTH AT BEDTIME FOR PROSTATE 01/31/23   Raynelle Dick, NP  TRULICITY 0.75 MG/0.5ML SOPN ADMINISTER 0.75 MG UNDER THE SKIN 1 TIME A WEEK 05/10/23   Raynelle Dick, NP  Zinc 50 MG TABS Take by mouth.    [provider]    Allergies  Allergen Reactions   Ace Inhibitors Other (See Comments)    Unknown   Ibuprofen Other (See Comments)    GI upset   Invokana [Canagliflozin] Other (See Comments)    Unknown   Quinidine Other (See Comments)    Unknown    Social History   Socioeconomic History   Marital status: Married    Spouse name: Not on file   Number of children: Not on file   Years of education: Not on file   Highest education level: Not on file  Occupational History   Not on file  Tobacco Use   Smoking status: Never   Smokeless tobacco: Never  Vaping Use   Vaping status: Never Used  Substance and Sexual Activity   Alcohol use: Yes    Comment: very rarely   Drug use: Never   Sexual activity: Not on file  Other Topics Concern   Not on file  Social History Narrative   Not on file   Social Determinants of Health   Financial Resource Strain: Not on file  Food Insecurity: Not on file  Transportation  Needs: Not on file  Physical Activity: Not on file  Stress: Not on file  Social Connections: Not on file  Intimate Partner Violence: Not on file    Tobacco Use: Low Risk  (04/23/2023)   Patient History    Smoking Tobacco Use: Never    Smokeless Tobacco Use: Never    Passive Exposure: Not on file   Social History   Substance and Sexual Activity  Alcohol Use Yes   Comment: very rarely    Family History  Problem Relation Age of Onset   Thyroid disease Father    Cancer Father        lung   Cancer Mother  throat   Diabetes Mother    Thyroid disease Sister    Hypertension Son    Hyperlipidemia Son    Cancer Paternal Grandmother        colon    Review of Systems  Constitutional:  Negative for chills and fever.  HENT:  Negative for congestion, sore throat and tinnitus.   Eyes:  Negative for double vision, photophobia and pain.  Respiratory:  Negative for cough, shortness of breath and wheezing.   Cardiovascular:  Negative for chest pain, palpitations and orthopnea.  Gastrointestinal:  Negative for heartburn, nausea and vomiting.  Genitourinary:  Negative for dysuria, frequency and urgency.  Musculoskeletal:  Positive for joint pain.  Neurological:  Negative for dizziness, weakness and headaches.    Objective:  Physical Exam: Well nourished and well developed.  General: Alert and oriented x3, cooperative and pleasant, no acute distress.  Head: normocephalic, atraumatic, neck supple.  Eyes: EOMI.  Musculoskeletal:  -Right knee shows an intra-articular effusion and a Baker's cyst.  - Range of motion is about 5-120.  - Crepitus on range of motion.  - Diffuse tenderness about the knee.  - 2+ edema in the lower leg.  - No instability about the knee.  Calves soft and nontender. Motor function intact in LE. Strength 5/5 LE bilaterally. Neuro: Distal pulses 2+. Sensation to light touch intact in LE.   Imaging Review Plain radiographs demonstrate severe  degenerative joint disease of the right knee. The overall alignment is neutral. The bone quality appears to be adequate for age and reported activity level.  Assessment/Plan:  End stage arthritis, right knee   The patient history, physical examination, clinical judgment of the provider and imaging studies are consistent with end stage degenerative joint disease of the right knee and total knee arthroplasty is deemed medically necessary. The treatment options including medical management, injection therapy arthroscopy and arthroplasty were discussed at length. The risks and benefits of total knee arthroplasty were presented and reviewed. The risks due to aseptic loosening, infection, stiffness, patella tracking problems, thromboembolic complications and other imponderables were discussed. The patient acknowledged the explanation, agreed to proceed with the plan and consent was signed. Patient is being admitted for inpatient treatment for surgery, pain control, PT, OT, prophylactic antibiotics, VTE prophylaxis, progressive ambulation and ADLs and discharge planning. The patient is planning to be discharged  home .   Patient's anticipated LOS is less than 2 midnights, meeting these requirements: - Lives within 1 hour of care - Has a competent adult at home to recover with post-op recover - NO history of  - Chronic pain requiring opioids  - Coronary Artery Disease  - Heart failure  - Heart attack  - Stroke  - DVT/VTE  - Cardiac arrhythmia  - Respiratory Failure/COPD  - Anemia  - Advanced Liver disease  Therapy Plans: Outpatient therapy at [facility] Disposition: Home with wife Planned DVT Prophylaxis: Eliquis 2.5 mg BID DME Needed: Dan Humphreys PCP: Lucky Cowboy, MD (clearance received) TXA: IV Allergies: Ibuprofen (kidney impairment) Metal Allergy: None Anesthesia Concerns: N/V BMI: 32.1 Last HgbA1c: 7.6% (05/28/23) Pain Regimen: Oxycodone, tramadol Pharmacy: [facility] (have brought to  room)  Other: - Cannot take NSAIDs or ASA due to kidneys  - Patient was instructed on what medications to stop prior to surgery. - Follow-up visit in 2 weeks with Dr. Lequita Halt - Begin physical therapy following surgery - Pre-operative lab work as pre-surgical testing - Prescriptions will be provided in hospital at time of discharge  Arther Abbott, PA-C  Orthopedic Surgery [facility] [place]

## 2023-06-13 ENCOUNTER — Encounter (HOSPITAL_COMMUNITY): Payer: Self-pay

## 2023-06-13 NOTE — Patient Instructions (Addendum)
SURGICAL WAITING ROOM VISITATION  Patients having surgery or a procedure may have no more than 2 support people in the waiting area - these visitors may rotate.    Children under the age of 36 must have an adult with them who is not the patient.   If the patient needs to stay at the hospital during part of their recovery, the visitor guidelines for inpatient rooms apply. Pre-op nurse will coordinate an appropriate time for 1 support person to accompany patient in pre-op.  This support person may not rotate.    Please refer to the [facility] website for the visitor guidelines for Inpatients (after your surgery is over and you are in a regular room).       Your procedure is scheduled on: 07-02-23     Report to [facility] Main Entrance    Report to admitting at       0550  AM   Call this number if you have problems the morning of surgery [phone]   Do not eat food :After Midnight.   After Midnight you may have the following liquids until _0520 _____ AM/ DAY OF SURGERY  then nothing by mouth  Water Non-Citrus Juices (without pulp, NO RED-Apple, White grape, White cranberry) Black Coffee (NO MILK/CREAM OR CREAMERS, sugar ok)  Clear Tea (NO MILK/CREAM OR CREAMERS, sugar ok) regular and decaf                             Plain Jell-O (NO RED)                                           Fruit ices (not with fruit pulp, NO RED)                                     Popsicles (NO RED)                                                               Sports drinks like Gatorade (NO RED)                    The day of surgery:  Drink ONE (1) Pre-Surgery  G2 at    0500 AM the morning of surgery. Drink in one sitting. Do not sip.  This drink was given to you during your hospital  pre-op appointment visit. Nothing else to drink after completing the  Pre-Surgery  G2   BY 0520 AM.          If you have questions, please contact your surgeon's office.   FOLLOW ANY ADDITIONAL PRE OP  INSTRUCTIONS YOU RECEIVED FROM YOUR SURGEON'S OFFICE!!!     Oral Hygiene is also important to reduce your risk of infection.                                    Remember - BRUSH YOUR TEETH THE MORNING OF SURGERY WITH YOUR REGULAR TOOTHPASTE  DENTURES  WILL BE REMOVED PRIOR TO SURGERY PLEASE DO NOT APPLY "Poly grip" OR ADHESIVES!!!   Do NOT smoke after Midnight   Stop all vitamins and herbal supplements 7 days before surgery.   Take these medicines the morning of surgery with A SIP OF WATER: amlodipine, atenelol, leflunomide               DAY BEFORE SURGERY HOLD EVENING DOSE OF GLYBURIDE  DO NOT TAKE ANY ORAL DIABETIC MEDICATIONS DAY OF YOUR SURGERY  HOLD TRULICITY 7 DAYS PRIOR TO SURGERY   How to Manage Your Diabetes Before and After Surgery  Why is it important to control my blood sugar before and after surgery? Improving blood sugar levels before and after surgery helps healing and can limit problems. A way of improving blood sugar control is eating a healthy diet by:  Eating less sugar and carbohydrates  Increasing activity/exercise  Talking with your doctor about reaching your blood sugar goals High blood sugars (greater than 180 mg/dL) can raise your risk of infections and slow your recovery, so you will need to focus on controlling your diabetes during the weeks before surgery. Make sure that the doctor who takes care of your diabetes knows about your planned surgery including the date and location.  How do I manage my blood sugar before surgery? Check your blood sugar at least 4 times a day, starting 2 days before surgery, to make sure that the level is not too high or low. Check your blood sugar the morning of your surgery when you wake up and every 2 hours until you get to the Short Stay unit. If your blood sugar is less than 70 mg/dL, you will need to treat for low blood sugar: Do not take insulin. Treat a low blood sugar (less than 70 mg/dL) with  cup of clear juice  (cranberry or apple), 4 glucose tablets, OR glucose gel. Recheck blood sugar in 15 minutes after treatment (to make sure it is greater than 70 mg/dL). If your blood sugar is not greater than 70 mg/dL on recheck, call [phone] for further instructions. Report your blood sugar to the short stay nurse when you get to Short Stay.  If you are admitted to the hospital after surgery: Your blood sugar will be checked by the staff and you will probably be given insulin after surgery (instead of oral diabetes medicines) to make sure you have good blood sugar levels. The goal for blood sugar control after surgery is 80-180 mg/dL.   WHAT DO I DO ABOUT MY DIABETES MEDICATION?  Do not take oral diabetes medicines (pills) the morning of surgery.   DO NOT TAKE THE FOLLOWING 7 DAYS PRIOR TO SURGERY: Ozempic, Wegovy, Rybelsus (Semaglutide), Byetta (exenatide), Bydureon (exenatide ER), Victoza, Saxenda (liraglutide), or Trulicity (dulaglutide) Mounjaro (Tirzepatide) Adlyxin (Lixisenatide), Polyethylene Glycol Loxenatide.   I Bring CPAP mask and tubing day of surgery.                              You may not have any metal on your body including hair pins, jewelry, and body piercing             Do not wear lotions, powders, /cologne, or deodorant                Men may shave face and neck.   Do not bring valuables to the hospital. [facility] IS NOT  RESPONSIBLE   FOR VALUABLES.   Contacts, glasses, dentures or bridgework may not be worn into surgery.   Bring small overnight bag day of surgery.   DO NOT BRING YOUR HOME MEDICATIONS TO THE HOSPITAL. PHARMACY WILL DISPENSE MEDICATIONS LISTED ON YOUR MEDICATION LIST TO YOU DURING YOUR ADMISSION IN THE HOSPITAL!    Patients discharged on the day of surgery will not be allowed to drive home.  Someone NEEDS to stay with you for the first 24 hours after anesthesia.   Special Instructions: Bring a copy of your healthcare power of attorney  and living will documents the day of surgery if you haven't scanned them before.              Please read over the following fact sheets you were given: IF YOU HAVE QUESTIONS ABOUT YOUR PRE-OP INSTRUCTIONS PLEASE CALL [phone]  If you test positive for Covid or have been in contact with anyone that has tested positive in the last 10 days please notify you surgeon.      Pre-operative 5 CHG Bath Instructions   You can play a key role in reducing the risk of infection after surgery. Your skin needs to be as free of germs as possible. You can reduce the number of germs on your skin by washing with CHG (chlorhexidine gluconate) soap before surgery. CHG is an antiseptic soap that kills germs and continues to kill germs even after washing.   DO NOT use if you have an allergy to chlorhexidine/CHG or antibacterial soaps. If your skin becomes reddened or irritated, stop using the CHG and notify one of our RNs at [phone].   Please shower with the CHG soap starting 4 days before surgery using the following schedule:     Please keep in mind the following:  DO NOT shave, including legs and underarms, starting the day of your first shower.   You may shave your face at any point before/day of surgery.  Place clean sheets on your bed the day you start using CHG soap. Use a clean washcloth (not used since being washed) for each shower. DO NOT sleep with pets once you start using the CHG.   CHG Shower Instructions:  If you choose to wash your hair and private area, wash first with your normal shampoo/soap.  After you use shampoo/soap, rinse your hair and body thoroughly to remove shampoo/soap residue.  Turn the water OFF and apply about 3 tablespoons (45 ml) of CHG soap to a CLEAN washcloth.  Apply CHG soap ONLY FROM YOUR NECK DOWN TO YOUR TOES (washing for 3-5 minutes)  DO NOT use CHG soap on face, private areas, open wounds, or sores.  Pay special attention to the area where your surgery is  being performed.  If you are having back surgery, having someone wash your back for you may be helpful. Wait 2 minutes after CHG soap is applied, then you may rinse off the CHG soap.  Pat dry with a clean towel  Put on clean clothes/pajamas   If you choose to wear lotion, please use ONLY the CHG-compatible lotions on the back of this paper.     Additional instructions for the day of surgery: DO NOT APPLY any lotions, deodorants, cologne, or perfumes.   Put on clean/comfortable clothes.  Brush your teeth.  Ask your nurse before applying any prescription medications to the skin.      CHG Compatible Lotions   Aveeno Moisturizing lotion  Cetaphil Moisturizing Cream  Cetaphil Moisturizing  Lotion  Clairol Herbal Essence Moisturizing Lotion, Dry Skin  Clairol Herbal Essence Moisturizing Lotion, Extra Dry Skin  Clairol Herbal Essence Moisturizing Lotion, Normal Skin  Curel Age Defying Therapeutic Moisturizing Lotion with Alpha Hydroxy  Curel Extreme Care Body Lotion  Curel Soothing Hands Moisturizing Hand Lotion  Curel Therapeutic Moisturizing Cream, Fragrance-Free  Curel Therapeutic Moisturizing Lotion, Fragrance-Free  Curel Therapeutic Moisturizing Lotion, Original Formula  Eucerin Daily Replenishing Lotion  Eucerin Dry Skin Therapy Plus Alpha Hydroxy Crme  Eucerin Dry Skin Therapy Plus Alpha Hydroxy Lotion  Eucerin Original Crme  Eucerin Original Lotion  Eucerin Plus Crme Eucerin Plus Lotion  Eucerin TriLipid Replenishing Lotion  Keri Anti-Bacterial Hand Lotion  Keri Deep Conditioning Original Lotion Dry Skin Formula Softly Scented  Keri Deep Conditioning Original Lotion, Fragrance Free Sensitive Skin Formula  Keri Lotion Fast Absorbing Fragrance Free Sensitive Skin Formula  Keri Lotion Fast Absorbing Softly Scented Dry Skin Formula  Keri Original Lotion  Keri Skin Renewal Lotion Keri Silky Smooth Lotion  Keri Silky Smooth Sensitive Skin Lotion  Nivea Body Creamy  Conditioning Oil  Nivea Body Extra Enriched Lotion  Nivea Body Original Lotion  Nivea Body Sheer Moisturizing Lotion Nivea Crme  Nivea Skin Firming Lotion  NutraDerm 30 Skin Lotion  NutraDerm Skin Lotion  NutraDerm Therapeutic Skin Cream  NutraDerm Therapeutic Skin Lotion  ProShield Protective Hand Cream  Provon moisturizing lotion   Incentive Spirometer  An incentive spirometer is a tool that can help keep your lungs clear and active. This tool measures how well you are filling your lungs with each breath. Taking long deep breaths may help reverse or decrease the chance of developing breathing (pulmonary) problems (especially infection) following: A long period of time when you are unable to move or be active. BEFORE THE PROCEDURE  If the spirometer includes an indicator to show your best effort, your nurse or respiratory therapist will set it to a desired goal. If possible, sit up straight or lean slightly forward. Try not to slouch. Hold the incentive spirometer in an upright position. INSTRUCTIONS FOR USE  Sit on the edge of your bed if possible, or sit up as far as you can in bed or on a chair. Hold the incentive spirometer in an upright position. Breathe out normally. Place the mouthpiece in your mouth and seal your lips tightly around it. Breathe in slowly and as deeply as possible, raising the piston or the ball toward the top of the column. Hold your breath for 3-5 seconds or for as long as possible. Allow the piston or ball to fall to the bottom of the column. Remove the mouthpiece from your mouth and breathe out normally. Rest for a few seconds and repeat Steps 1 through 7 at least 10 times every 1-2 hours when you are awake. Take your time and take a few normal breaths between deep breaths. The spirometer may include an indicator to show your best effort. Use the indicator as a goal to work toward during each repetition. After each set of 10 deep breaths, practice coughing  to be sure your lungs are clear. If you have an incision (the cut made at the time of surgery), support your incision when coughing by placing a pillow or rolled up towels firmly against it. Once you are able to get out of bed, walk around indoors and cough well. You may stop using the incentive spirometer when instructed by your caregiver.  RISKS AND COMPLICATIONS Take your time so you do not get dizzy  or light-headed. If you are in pain, you may need to take or ask for pain medication before doing incentive spirometry. It is harder to take a deep breath if you are having pain. AFTER USE Rest and breathe slowly and easily. It can be helpful to keep track of a log of your progress. Your caregiver can provide you with a simple table to help with this. If you are using the spirometer at home, follow these instructions: SEEK MEDICAL CARE IF:  You are having difficultly using the spirometer. You have trouble using the spirometer as often as instructed. Your pain medication is not giving enough relief while using the spirometer. You develop fever of 100.5 F (38.1 C) or higher. SEEK IMMEDIATE MEDICAL CARE IF:  You cough up bloody sputum that had not been present before. You develop fever of 102 F (38.9 C) or greater. You develop worsening pain at or near the incision site. MAKE SURE YOU:  Understand these instructions. Will watch your condition. Will get help right away if you are not doing well or get worse. Document Released: 11/20/2006 Document Revised: 10/02/2011 Document Reviewed: 01/21/2007 [facility] Patient Information 2014 [place], [place].   ________________________________________________________________________

## 2023-06-13 NOTE — Progress Notes (Addendum)
PCP - Dr. Lucky Cowboy clearance on chart 06-04-23 , LOV 03-06-23 on chart Cardiologist -   PPM/ICD -  Device Orders -  Rep Notified -   Chest x-ray -  EKG - 09-25-22 epic Stress Test -  ECHO -  Cardiac Cath -  HgbA1c- 05-28-23 7.6  epic Sleep Study -  CPAP -   Fasting Blood Sugar - 124 Checks Blood Sugar __sometimes___ Blood Thinner Instructions: Aspirin Instructions:N/A  ERAS Protcol - PRE-SURGERY G2-   TRULICITY HOLD 7 DAYS PRIOR TO SURGERY LAST DOSE- 06-23-23 COVID vaccine -yes  Activity--Able to climb a flight of stairs without CP or SOB Anesthesia review: DM HTN,OSAwears CPAP, hgbA1c 05-28-23 7.6, pt needs joint replacement on right shoulder   Patient denies shortness of breath, fever, cough and chest pain at PAT appointment   All instructions explained to the patient, with a verbal understanding of the material. Patient agrees to go over the instructions while at home for a better understanding. Patient also instructed to self quarantine after being tested for COVID-19. The opportunity to ask questions was provided.

## 2023-06-25 ENCOUNTER — Encounter (HOSPITAL_COMMUNITY): Payer: Self-pay

## 2023-06-25 ENCOUNTER — Other Ambulatory Visit: Payer: Self-pay

## 2023-06-25 ENCOUNTER — Encounter (HOSPITAL_COMMUNITY)
Admission: RE | Admit: 2023-06-25 | Discharge: 2023-06-25 | Disposition: A | Discharge status: Home / Self Care | Payer: Managed Care, Other (non HMO) | Source: Ambulatory Visit | Attending: Orthopedic Surgery

## 2023-06-25 VITALS — BP 138/82 | HR 71 | Temp 98.0°F | Resp 16 | Ht 68.0 in | Wt 214.0 lb

## 2023-06-25 DIAGNOSIS — I129 Hypertensive chronic kidney disease with stage 1 through stage 4 chronic kidney disease, or unspecified chronic kidney disease: Secondary | ICD-10-CM | POA: Insufficient documentation

## 2023-06-25 DIAGNOSIS — Z01812 Encounter for preprocedural laboratory examination: Secondary | ICD-10-CM | POA: Diagnosis not present

## 2023-06-25 DIAGNOSIS — I10 Essential (primary) hypertension: Secondary | ICD-10-CM

## 2023-06-25 DIAGNOSIS — Z01818 Encounter for other preprocedural examination: Secondary | ICD-10-CM | POA: Diagnosis present

## 2023-06-25 DIAGNOSIS — E1122 Type 2 diabetes mellitus with diabetic chronic kidney disease: Secondary | ICD-10-CM

## 2023-06-25 DIAGNOSIS — N183 Chronic kidney disease, stage 3 unspecified: Secondary | ICD-10-CM | POA: Insufficient documentation

## 2023-06-25 LAB — BASIC METABOLIC PANEL
Anion gap: 9 (ref 5–15)
BUN: 18 mg/dL (ref 8–23)
CO2: 27 mmol/L (ref 22–32)
Calcium: 9.2 mg/dL (ref 8.9–10.3)
Chloride: 99 mmol/L (ref 98–111)
Creatinine, Ser: 0.92 mg/dL (ref 0.61–1.24)
GFR, Estimated: >60 mL/min (ref >60)
Glucose, Bld: 151 mg/dL — ABNORMAL HIGH (ref 70–99)
Potassium: 4.1 mmol/L (ref 3.5–5.1)
Sodium: 135 mmol/L (ref 135–145)

## 2023-06-25 LAB — CBC
HCT: 44.5 % (ref 39.0–52.0)
Hemoglobin: 14.3 g/dL (ref 13.0–17.0)
MCH: 26.8 pg (ref 26.0–34.0)
MCHC: 32.1 g/dL (ref 30.0–36.0)
MCV: 83.5 fL (ref 80.0–100.0)
Platelets: 197 10*3/uL (ref 150–400)
RBC: 5.33 MIL/uL (ref 4.22–5.81)
RDW: 14 % (ref 11.5–15.5)
WBC: 8 10*3/uL (ref 4.0–10.5)
nRBC: 0 % (ref 0.0–0.2)

## 2023-06-25 LAB — SURGICAL PCR SCREEN
MRSA, PCR: NEGATIVE
Staphylococcus aureus: NEGATIVE

## 2023-06-25 LAB — GLUCOSE, CAPILLARY: Glucose-Capillary: 156 mg/dL — ABNORMAL HIGH (ref 70–99)

## 2023-07-01 NOTE — Anesthesia Preprocedure Evaluation (Signed)
Anesthesia Evaluation  Patient identified by MRN, date of birth, ID band Patient awake    Reviewed: Allergy & Precautions, NPO status , Patient's Chart, lab work & pertinent test results  History of Anesthesia Complications (+) PONV and history of anesthetic complications  Airway Mallampati: II  TM Distance: >3 FB Neck ROM: Full   Comment: Previous grade I view with MAC 4, easy mask Dental  (+) Dental Advisory Given   Pulmonary neg shortness of breath, sleep apnea (severe) and Continuous Positive Airway Pressure Ventilation , neg COPD, neg recent URI Sarcoidosis    Pulmonary exam normal breath sounds clear to auscultation       Cardiovascular hypertension (amlodipine, atenolol, HCTZ), Pt. on medications and Pt. on home beta blockers (-) angina (-) Past MI, (-) Cardiac Stents and (-) CABG (-) dysrhythmias  Rhythm:Regular Rate:Normal  HLD   Neuro/Psych negative neurological ROS     GI/Hepatic ,GERD  Medicated,,Heapatic steatosis   Endo/Other  diabetes (Hgb A1c 7.6), Poorly Controlled, Type 2, Oral Hypoglycemic Agents    Renal/GU CRFRenal disease   BPH    Musculoskeletal  (+) Arthritis , Osteoarthritis,    Abdominal   Peds  Hematology negative hematology ROS (+) Lab Results      Component                Value               Date                      WBC                      8.0                 06/25/2023                HGB                      14.3                06/25/2023                HCT                      44.5                06/25/2023                MCV                      83.5                06/25/2023                PLT                      197                 06/25/2023              Anesthesia Other Findings Last Trulicity: 06/23/2023  Reproductive/Obstetrics                             Anesthesia Physical Anesthesia Plan  ASA: 3  Anesthesia Plan: MAC, Regional and Spinal    Post-op Pain Management: Regional block* and Tylenol PO (pre-op)*  Induction: Intravenous  PONV Risk Score and Plan: 2 and Ondansetron, Dexamethasone and Treatment may vary due to age or medical condition  Airway Management Planned: Natural Airway and Simple Face Mask  Additional Equipment:   Intra-op Plan:   Post-operative Plan:   Informed Consent: I have reviewed the patients History and Physical, chart, labs and discussed the procedure including the risks, benefits and alternatives for the proposed anesthesia with the patient or authorized representative who has indicated his/her understanding and acceptance.     Dental advisory given  Plan Discussed with: CRNA and Anesthesiologist  Anesthesia Plan Comments: (Discussed potential risks of nerve blocks including, but not limited to, infection, bleeding, nerve damage, seizures, pneumothorax, respiratory depression, and potential failure of the block. Alternatives to nerve blocks discussed. All questions answered.  I have discussed risks of neuraxial anesthesia including but not limited to infection, bleeding, nerve injury, back pain, headache, seizures, and failure of block. Patient denies bleeding disorders and is not currently anticoagulated. Labs have been reviewed. Risks and benefits discussed. All patient's questions answered.   Discussed with patient risks of MAC including, but not limited to, minor pain or discomfort, hearing people in the room, and possible need for backup general anesthesia. Risks for general anesthesia also discussed including, but not limited to, sore throat, hoarse voice, chipped/damaged teeth, injury to vocal cords, nausea and vomiting, allergic reactions, lung infection, heart attack, stroke, and death. All questions answered. )       Anesthesia Quick Evaluation

## 2023-07-02 ENCOUNTER — Ambulatory Visit (HOSPITAL_COMMUNITY): Payer: Self-pay | Admitting: Anesthesiology

## 2023-07-02 ENCOUNTER — Ambulatory Visit (HOSPITAL_COMMUNITY): Payer: Managed Care, Other (non HMO) | Admitting: Anesthesiology

## 2023-07-02 ENCOUNTER — Observation Stay (HOSPITAL_COMMUNITY)
Admission: RE | Admit: 2023-07-02 | Discharge: 2023-07-03 | Disposition: A | Discharge status: Home / Self Care | Payer: Managed Care, Other (non HMO) | Source: Ambulatory Visit | Attending: Orthopedic Surgery | Admitting: Orthopedic Surgery

## 2023-07-02 ENCOUNTER — Encounter (HOSPITAL_COMMUNITY)
Admission: RE | Disposition: A | Discharge status: Home / Self Care | Payer: Self-pay | Source: Ambulatory Visit | Attending: Orthopedic Surgery

## 2023-07-02 ENCOUNTER — Other Ambulatory Visit: Payer: Self-pay

## 2023-07-02 ENCOUNTER — Encounter (HOSPITAL_COMMUNITY): Payer: Self-pay | Admitting: Orthopedic Surgery

## 2023-07-02 DIAGNOSIS — N183 Chronic kidney disease, stage 3 unspecified: Secondary | ICD-10-CM | POA: Diagnosis not present

## 2023-07-02 DIAGNOSIS — N181 Chronic kidney disease, stage 1: Secondary | ICD-10-CM | POA: Diagnosis not present

## 2023-07-02 DIAGNOSIS — Z8616 Personal history of COVID-19: Secondary | ICD-10-CM | POA: Insufficient documentation

## 2023-07-02 DIAGNOSIS — M1711 Unilateral primary osteoarthritis, right knee: Principal | ICD-10-CM | POA: Insufficient documentation

## 2023-07-02 DIAGNOSIS — Z79899 Other long term (current) drug therapy: Secondary | ICD-10-CM | POA: Insufficient documentation

## 2023-07-02 DIAGNOSIS — Z7984 Long term (current) use of oral hypoglycemic drugs: Secondary | ICD-10-CM | POA: Insufficient documentation

## 2023-07-02 DIAGNOSIS — I129 Hypertensive chronic kidney disease with stage 1 through stage 4 chronic kidney disease, or unspecified chronic kidney disease: Secondary | ICD-10-CM | POA: Insufficient documentation

## 2023-07-02 DIAGNOSIS — E1122 Type 2 diabetes mellitus with diabetic chronic kidney disease: Secondary | ICD-10-CM | POA: Diagnosis not present

## 2023-07-02 DIAGNOSIS — Z96652 Presence of left artificial knee joint: Secondary | ICD-10-CM | POA: Insufficient documentation

## 2023-07-02 HISTORY — PX: TOTAL KNEE ARTHROPLASTY: SHX125

## 2023-07-02 LAB — GLUCOSE, CAPILLARY
Glucose-Capillary: 205 mg/dL — ABNORMAL HIGH (ref 70–99)
Glucose-Capillary: 142 mg/dL — ABNORMAL HIGH (ref 70–99)
Glucose-Capillary: 234 mg/dL — ABNORMAL HIGH (ref 70–99)
Glucose-Capillary: 415 mg/dL — ABNORMAL HIGH (ref 70–99)
Glucose-Capillary: 454 mg/dL — ABNORMAL HIGH (ref 70–99)
Glucose-Capillary: 388 mg/dL — ABNORMAL HIGH (ref 70–99)

## 2023-07-02 SURGERY — ARTHROPLASTY, KNEE, TOTAL
Anesthesia: Monitor Anesthesia Care | Site: Knee | Laterality: Right

## 2023-07-02 MED ORDER — BUPIVACAINE IN DEXTROSE 0.75-8.25 % IT SOLN
INTRATHECAL | Status: DC | PRN
  Administered 2023-07-02: 1.6 mL via INTRATHECAL

## 2023-07-02 MED ORDER — SODIUM CHLORIDE (PF) 0.9 % IJ SOLN
INTRAMUSCULAR | Status: DC | PRN
  Administered 2023-07-02: 60 mL via INTRAVENOUS

## 2023-07-02 MED ORDER — 0.9 % SODIUM CHLORIDE (POUR BTL) OPTIME
TOPICAL | Status: DC | PRN
  Administered 2023-07-02: 1000 mL

## 2023-07-02 MED ORDER — ACETAMINOPHEN 500 MG PO TABS
1000.0000 mg | ORAL_TABLET | Freq: Four times a day (QID) | ORAL | Status: AC
  Administered 2023-07-02 – 2023-07-03 (×4): 1000 mg via ORAL
  Filled 2023-07-02 (×4): qty 2

## 2023-07-02 MED ORDER — HYDROCHLOROTHIAZIDE 25 MG PO TABS
25.0000 mg | ORAL_TABLET | Freq: Every day | ORAL | Status: DC
  Administered 2023-07-03: 25 mg via ORAL
  Filled 2023-07-02: qty 1

## 2023-07-02 MED ORDER — ORAL CARE MOUTH RINSE: 15.0000 mL | Freq: Once | OROMUCOSAL | Status: AC

## 2023-07-02 MED ORDER — DOCUSATE SODIUM 100 MG PO CAPS
100.0000 mg | ORAL_CAPSULE | Freq: Two times a day (BID) | ORAL | Status: DC
  Administered 2023-07-02 – 2023-07-03 (×3): 100 mg via ORAL
  Filled 2023-07-02 (×3): qty 1

## 2023-07-02 MED ORDER — SODIUM CHLORIDE 0.9 % IV SOLN: INTRAVENOUS | Status: DC

## 2023-07-02 MED ORDER — BISACODYL 10 MG RE SUPP: 10.0000 mg | Freq: Every day | RECTAL | Status: DC | PRN

## 2023-07-02 MED ORDER — DEXAMETHASONE SODIUM PHOSPHATE 10 MG/ML IJ SOLN
INTRAMUSCULAR | Status: AC
  Filled 2023-07-02: qty 2

## 2023-07-02 MED ORDER — SODIUM CHLORIDE (PF) 0.9 % IJ SOLN
INTRAMUSCULAR | Status: AC
  Filled 2023-07-02: qty 50

## 2023-07-02 MED ORDER — CEFAZOLIN SODIUM-DEXTROSE 2-4 GM/100ML-% IV SOLN
2.0000 g | Freq: Four times a day (QID) | INTRAVENOUS | Status: AC
  Administered 2023-07-02 (×2): 2 g via INTRAVENOUS
  Filled 2023-07-02 (×2): qty 100

## 2023-07-02 MED ORDER — MIDAZOLAM HCL 2 MG/2ML IJ SOLN
INTRAMUSCULAR | Status: AC
  Filled 2023-07-02: qty 2

## 2023-07-02 MED ORDER — METFORMIN HCL ER 500 MG PO TB24
500.0000 mg | ORAL_TABLET | Freq: Every day | ORAL | Status: DC
  Administered 2023-07-03: 500 mg via ORAL
  Filled 2023-07-02: qty 1

## 2023-07-02 MED ORDER — METHOCARBAMOL 500 MG PO TABS
500.0000 mg | ORAL_TABLET | Freq: Four times a day (QID) | ORAL | Status: DC | PRN
  Administered 2023-07-02 – 2023-07-03 (×3): 500 mg via ORAL
  Filled 2023-07-02 (×4): qty 1

## 2023-07-02 MED ORDER — ONDANSETRON HCL 4 MG PO TABS: 4.0000 mg | ORAL_TABLET | Freq: Four times a day (QID) | ORAL | Status: DC | PRN

## 2023-07-02 MED ORDER — PROPOFOL 1000 MG/100ML IV EMUL
INTRAVENOUS | Status: AC
  Filled 2023-07-02: qty 100

## 2023-07-02 MED ORDER — LACTATED RINGERS IV SOLN: INTRAVENOUS | Status: DC

## 2023-07-02 MED ORDER — CEFAZOLIN SODIUM-DEXTROSE 2-4 GM/100ML-% IV SOLN
2.0000 g | INTRAVENOUS | Status: AC
  Administered 2023-07-02: 2 g via INTRAVENOUS
  Filled 2023-07-02: qty 100

## 2023-07-02 MED ORDER — FLEET ENEMA RE ENEM: 1.0000 | ENEMA | Freq: Once | RECTAL | Status: DC | PRN

## 2023-07-02 MED ORDER — ONDANSETRON HCL 4 MG/2ML IJ SOLN: 4.0000 mg | Freq: Four times a day (QID) | INTRAMUSCULAR | Status: DC | PRN

## 2023-07-02 MED ORDER — POLYETHYLENE GLYCOL 3350 17 G PO PACK: 17.0000 g | PACK | Freq: Every day | ORAL | Status: DC | PRN

## 2023-07-02 MED ORDER — FENTANYL CITRATE (PF) 250 MCG/5ML IJ SOLN
INTRAMUSCULAR | Status: DC | PRN
  Administered 2023-07-02 (×4): 12.5 ug via INTRAVENOUS

## 2023-07-02 MED ORDER — PHENYLEPHRINE HCL-NACL 20-0.9 MG/250ML-% IV SOLN
INTRAVENOUS | Status: AC
  Filled 2023-07-02: qty 750

## 2023-07-02 MED ORDER — TAMSULOSIN HCL 0.4 MG PO CAPS
0.4000 mg | ORAL_CAPSULE | Freq: Every day | ORAL | Status: DC
  Administered 2023-07-02: 0.4 mg via ORAL
  Filled 2023-07-02: qty 1

## 2023-07-02 MED ORDER — OXYCODONE HCL 5 MG PO TABS
5.0000 mg | ORAL_TABLET | ORAL | Status: DC | PRN
  Administered 2023-07-02: 5 mg via ORAL
  Filled 2023-07-02: qty 1
  Filled 2023-07-02: qty 2
  Filled 2023-07-02 (×2): qty 1

## 2023-07-02 MED ORDER — DEXAMETHASONE SODIUM PHOSPHATE 10 MG/ML IJ SOLN
INTRAMUSCULAR | Status: AC
  Filled 2023-07-02: qty 1

## 2023-07-02 MED ORDER — ACETAMINOPHEN 325 MG PO TABS: 325.0000 mg | ORAL_TABLET | Freq: Four times a day (QID) | ORAL | Status: DC | PRN

## 2023-07-02 MED ORDER — FENTANYL CITRATE PF 50 MCG/ML IJ SOSY
50.0000 ug | PREFILLED_SYRINGE | INTRAMUSCULAR | Status: DC
  Administered 2023-07-02: 100 ug via INTRAVENOUS
  Filled 2023-07-02: qty 2

## 2023-07-02 MED ORDER — PROPOFOL 10 MG/ML IV BOLUS
INTRAVENOUS | Status: DC | PRN
  Administered 2023-07-02: 60 ug/kg/min via INTRAVENOUS

## 2023-07-02 MED ORDER — METOCLOPRAMIDE HCL 5 MG/ML IJ SOLN: 5.0000 mg | Freq: Three times a day (TID) | INTRAMUSCULAR | Status: DC | PRN

## 2023-07-02 MED ORDER — ONDANSETRON HCL 4 MG/2ML IJ SOLN
INTRAMUSCULAR | Status: DC | PRN
  Administered 2023-07-02: 4 mg via INTRAVENOUS

## 2023-07-02 MED ORDER — TRAMADOL HCL 50 MG PO TABS
50.0000 mg | ORAL_TABLET | Freq: Four times a day (QID) | ORAL | Status: DC | PRN
  Administered 2023-07-02: 50 mg via ORAL
  Administered 2023-07-03 (×2): 100 mg via ORAL
  Filled 2023-07-02: qty 2
  Filled 2023-07-02: qty 1
  Filled 2023-07-02 (×2): qty 2

## 2023-07-02 MED ORDER — ONDANSETRON HCL 4 MG/2ML IJ SOLN
INTRAMUSCULAR | Status: AC
  Filled 2023-07-02: qty 2

## 2023-07-02 MED ORDER — PHENYLEPHRINE HCL-NACL 20-0.9 MG/250ML-% IV SOLN
INTRAVENOUS | Status: DC | PRN
  Administered 2023-07-02: 20 ug/min via INTRAVENOUS

## 2023-07-02 MED ORDER — MENTHOL 3 MG MT LOZG: 1.0000 | LOZENGE | OROMUCOSAL | Status: DC | PRN

## 2023-07-02 MED ORDER — ATENOLOL 50 MG PO TABS
100.0000 mg | ORAL_TABLET | Freq: Every day | ORAL | Status: DC
  Administered 2023-07-03: 100 mg via ORAL
  Filled 2023-07-02: qty 2

## 2023-07-02 MED ORDER — PHENOL 1.4 % MT LIQD: 1.0000 | OROMUCOSAL | Status: DC | PRN

## 2023-07-02 MED ORDER — ACETAMINOPHEN 500 MG PO TABS: 1000.0000 mg | ORAL_TABLET | Freq: Once | ORAL | Status: DC

## 2023-07-02 MED ORDER — POVIDONE-IODINE 10 % EX SWAB: 2.0000 | Freq: Once | CUTANEOUS | Status: DC

## 2023-07-02 MED ORDER — SODIUM CHLORIDE 0.9 % IR SOLN
Status: DC | PRN
  Administered 2023-07-02: 1000 mL

## 2023-07-02 MED ORDER — BUPIVACAINE LIPOSOME 1.3 % IJ SUSP
INTRAMUSCULAR | Status: AC
  Filled 2023-07-02: qty 20

## 2023-07-02 MED ORDER — AMISULPRIDE (ANTIEMETIC) 5 MG/2ML IV SOLN: 10.0000 mg | Freq: Once | INTRAVENOUS | Status: DC | PRN

## 2023-07-02 MED ORDER — APIXABAN 2.5 MG PO TABS
2.5000 mg | ORAL_TABLET | Freq: Two times a day (BID) | ORAL | Status: DC
  Administered 2023-07-03: 2.5 mg via ORAL
  Filled 2023-07-02: qty 1

## 2023-07-02 MED ORDER — OXYCODONE HCL 5 MG PO TABS: 5.0000 mg | ORAL_TABLET | Freq: Once | ORAL | Status: DC | PRN

## 2023-07-02 MED ORDER — LEFLUNOMIDE 10 MG PO TABS
10.0000 mg | ORAL_TABLET | Freq: Every day | ORAL | Status: DC
  Administered 2023-07-03: 10 mg via ORAL
  Filled 2023-07-02: qty 1

## 2023-07-02 MED ORDER — MIDAZOLAM HCL 2 MG/2ML IJ SOLN
INTRAMUSCULAR | Status: DC | PRN
  Administered 2023-07-02: 2 mg via INTRAVENOUS

## 2023-07-02 MED ORDER — INSULIN ASPART 100 UNIT/ML IJ SOLN
0.0000 [IU] | INTRAMUSCULAR | Status: DC | PRN
  Administered 2023-07-02: 2 [IU] via SUBCUTANEOUS
  Filled 2023-07-02: qty 1

## 2023-07-02 MED ORDER — MIDAZOLAM HCL 2 MG/2ML IJ SOLN
1.0000 mg | INTRAMUSCULAR | Status: DC
Start: 2023-07-02 — End: 2023-07-02

## 2023-07-02 MED ORDER — DIPHENHYDRAMINE HCL 12.5 MG/5ML PO ELIX: 12.5000 mg | ORAL_SOLUTION | ORAL | Status: DC | PRN

## 2023-07-02 MED ORDER — BUPIVACAINE LIPOSOME 1.3 % IJ SUSP: 20.0000 mL | Freq: Once | INTRAMUSCULAR | Status: DC

## 2023-07-02 MED ORDER — MORPHINE SULFATE (PF) 2 MG/ML IV SOLN
1.0000 mg | INTRAVENOUS | Status: DC | PRN
  Administered 2023-07-02 (×2): 1 mg via INTRAVENOUS
  Filled 2023-07-02 (×2): qty 1

## 2023-07-02 MED ORDER — BUPIVACAINE LIPOSOME 1.3 % IJ SUSP
INTRAMUSCULAR | Status: DC | PRN
  Administered 2023-07-02: 20 mL

## 2023-07-02 MED ORDER — STERILE WATER FOR IRRIGATION IR SOLN
Status: DC | PRN
  Administered 2023-07-02: 1000 mL

## 2023-07-02 MED ORDER — INSULIN ASPART 100 UNIT/ML IJ SOLN
0.0000 [IU] | Freq: Three times a day (TID) | INTRAMUSCULAR | Status: DC
Start: 2023-07-02 — End: 2023-07-03
  Administered 2023-07-02: 4 [IU] via SUBCUTANEOUS
  Administered 2023-07-02: 10 [IU] via SUBCUTANEOUS
  Administered 2023-07-03: 11 [IU] via SUBCUTANEOUS
  Administered 2023-07-03: 8 [IU] via SUBCUTANEOUS

## 2023-07-02 MED ORDER — OXYCODONE HCL 5 MG/5ML PO SOLN: 5.0000 mg | Freq: Once | ORAL | Status: DC | PRN

## 2023-07-02 MED ORDER — FENTANYL CITRATE (PF) 100 MCG/2ML IJ SOLN
INTRAMUSCULAR | Status: AC
  Filled 2023-07-02: qty 2

## 2023-07-02 MED ORDER — GLYBURIDE 5 MG PO TABS: 5.0000 mg | ORAL_TABLET | Freq: Every day | ORAL | Status: DC | PRN

## 2023-07-02 MED ORDER — FENTANYL CITRATE PF 50 MCG/ML IJ SOSY: 25.0000 ug | PREFILLED_SYRINGE | INTRAMUSCULAR | Status: DC | PRN

## 2023-07-02 MED ORDER — INSULIN ASPART 100 UNIT/ML IJ SOLN
0.0000 [IU] | Freq: Every day | INTRAMUSCULAR | Status: DC
  Administered 2023-07-02: 5 [IU] via SUBCUTANEOUS

## 2023-07-02 MED ORDER — ACETAMINOPHEN 10 MG/ML IV SOLN
1000.0000 mg | Freq: Four times a day (QID) | INTRAVENOUS | Status: DC
  Filled 2023-07-02: qty 100

## 2023-07-02 MED ORDER — ROPIVACAINE HCL 5 MG/ML IJ SOLN
INTRAMUSCULAR | Status: DC | PRN
  Administered 2023-07-02: 20 mL via PERINEURAL

## 2023-07-02 MED ORDER — TRANEXAMIC ACID-NACL 1000-0.7 MG/100ML-% IV SOLN
1000.0000 mg | INTRAVENOUS | Status: AC
  Administered 2023-07-02: 1000 mg via INTRAVENOUS
  Filled 2023-07-02: qty 100

## 2023-07-02 MED ORDER — CHLORHEXIDINE GLUCONATE 0.12 % MT SOLN
15.0000 mL | Freq: Once | OROMUCOSAL | Status: AC
Start: 2023-07-02 — End: 2023-07-02
  Administered 2023-07-02: 15 mL via OROMUCOSAL

## 2023-07-02 MED ORDER — METOCLOPRAMIDE HCL 5 MG PO TABS: 5.0000 mg | ORAL_TABLET | Freq: Three times a day (TID) | ORAL | Status: DC | PRN

## 2023-07-02 MED ORDER — LOPERAMIDE HCL 2 MG PO CAPS: 2.0000 mg | ORAL_CAPSULE | Freq: Four times a day (QID) | ORAL | Status: DC | PRN

## 2023-07-02 MED ORDER — METHOCARBAMOL 1000 MG/10ML IJ SOLN: 500.0000 mg | Freq: Four times a day (QID) | INTRAMUSCULAR | Status: DC | PRN

## 2023-07-02 MED ORDER — DEXAMETHASONE SODIUM PHOSPHATE 10 MG/ML IJ SOLN
INTRAMUSCULAR | Status: DC | PRN
  Administered 2023-07-02: 8 mg

## 2023-07-02 MED ORDER — DEXAMETHASONE SODIUM PHOSPHATE 10 MG/ML IJ SOLN: 8.0000 mg | Freq: Once | INTRAMUSCULAR | Status: DC

## 2023-07-02 MED ORDER — MECLIZINE HCL 25 MG PO TABS: 25.0000 mg | ORAL_TABLET | Freq: Three times a day (TID) | ORAL | Status: DC | PRN

## 2023-07-02 MED ORDER — AMLODIPINE BESYLATE 10 MG PO TABS
10.0000 mg | ORAL_TABLET | Freq: Every day | ORAL | Status: DC
  Administered 2023-07-03: 10 mg via ORAL
  Filled 2023-07-02: qty 1

## 2023-07-02 MED ORDER — FAMOTIDINE 20 MG PO TABS: 40.0000 mg | ORAL_TABLET | Freq: Every day | ORAL | Status: DC | PRN

## 2023-07-02 MED ORDER — SODIUM CHLORIDE (PF) 0.9 % IJ SOLN
INTRAMUSCULAR | Status: AC
  Filled 2023-07-02: qty 10

## 2023-07-02 SURGICAL SUPPLY — 44 items
ATTUNE MED DOME PAT 38 KNEE (Knees) IMPLANT
ATTUNE PS FEM RT SZ 7 CEM KNEE (Femur) IMPLANT
ATTUNE PSRP INSR SZ7 8 KNEE (Insert) IMPLANT
BAG COUNTER SPONGE SURGICOUNT (BAG) IMPLANT
BAG ZIPLOCK 12X15 (MISCELLANEOUS) ×2 IMPLANT
BASE TIBIAL ROT PLAT SZ 7 KNEE (Knees) IMPLANT
BLADE SAG 18X100X1.27 (BLADE) ×2 IMPLANT
BLADE SAW SGTL 11.0X1.19X90.0M (BLADE) ×2 IMPLANT
BNDG ELASTIC 6INX 5YD STR LF (GAUZE/BANDAGES/DRESSINGS) ×2 IMPLANT
BOWL SMART MIX CTS (DISPOSABLE) ×2 IMPLANT
CEMENT HV SMART SET (Cement) ×4 IMPLANT
COVER SURGICAL LIGHT HANDLE (MISCELLANEOUS) ×2 IMPLANT
CUFF TRNQT CYL 34X4.125X (TOURNIQUET CUFF) ×2 IMPLANT
DERMABOND ADVANCED .7 DNX12 (GAUZE/BANDAGES/DRESSINGS) ×2 IMPLANT
DRAPE U-SHAPE 47X51 STRL (DRAPES) ×2 IMPLANT
DRSG AQUACEL AG ADV 3.5X10 (GAUZE/BANDAGES/DRESSINGS) ×2 IMPLANT
DURAPREP 26ML APPLICATOR (WOUND CARE) ×2 IMPLANT
ELECT REM PT RETURN 15FT ADLT (MISCELLANEOUS) ×2 IMPLANT
GLOVE BIO SURGEON STRL SZ 6.5 (GLOVE) IMPLANT
GLOVE BIO SURGEON STRL SZ8 (GLOVE) ×2 IMPLANT
GLOVE BIOGEL PI IND STRL 6.5 (GLOVE) IMPLANT
GLOVE BIOGEL PI IND STRL 7.0 (GLOVE) IMPLANT
GLOVE BIOGEL PI IND STRL 8 (GLOVE) ×2 IMPLANT
GOWN STRL REUS W/ TWL LRG LVL3 (GOWN DISPOSABLE) ×2 IMPLANT
HOLDER FOLEY CATH W/STRAP (MISCELLANEOUS) IMPLANT
IMMOBILIZER KNEE 20 (SOFTGOODS) ×1
IMMOBILIZER KNEE 20 THIGH 36 (SOFTGOODS) ×2 IMPLANT
KIT TURNOVER KIT A (KITS) IMPLANT
MANIFOLD NEPTUNE II (INSTRUMENTS) ×2 IMPLANT
NS IRRIG 1000ML POUR BTL (IV SOLUTION) ×2 IMPLANT
PACK TOTAL KNEE CUSTOM (KITS) ×2 IMPLANT
PADDING CAST COTTON 6X4 STRL (CAST SUPPLIES) ×4 IMPLANT
PIN STEINMAN FIXATION KNEE (PIN) IMPLANT
PROTECTOR NERVE ULNAR (MISCELLANEOUS) ×2 IMPLANT
SET HNDPC FAN SPRY TIP SCT (DISPOSABLE) ×2 IMPLANT
SUT MNCRL AB 4-0 PS2 18 (SUTURE) ×2 IMPLANT
SUT STRATAFIX 0 PDS 27 VIOLET (SUTURE) ×1
SUT VIC AB 2-0 CT1 TAPERPNT 27 (SUTURE) ×6 IMPLANT
SUTURE STRATFX 0 PDS 27 VIOLET (SUTURE) ×2 IMPLANT
TIBIAL BASE ROT PLAT SZ 7 KNEE (Knees) ×1 IMPLANT
TRAY FOLEY MTR SLVR 16FR STAT (SET/KITS/TRAYS/PACK) IMPLANT
TUBE SUCTION HIGH CAP CLEAR NV (SUCTIONS) ×2 IMPLANT
WATER STERILE IRR 1000ML POUR (IV SOLUTION) ×4 IMPLANT
WRAP KNEE MAXI GEL POST OP (GAUZE/BANDAGES/DRESSINGS) ×2 IMPLANT

## 2023-07-02 NOTE — Anesthesia Procedure Notes (Signed)
Procedure Name: MAC Date/Time: 07/02/2023 8:29 AM  Performed by: Floydene Flock, CRNAPre-anesthesia Checklist: Patient identified, Emergency Drugs available, Suction available, Patient being monitored and Timeout performed Patient Re-evaluated:Patient Re-evaluated prior to induction Oxygen Delivery Method: Simple face mask

## 2023-07-02 NOTE — Op Note (Signed)
OPERATIVE REPORT-TOTAL KNEE ARTHROPLASTY   Pre-operative diagnosis- Osteoarthritis  Right knee(s)  Post-operative diagnosis- Osteoarthritis Right knee(s)  Procedure-  Right  Total Knee Arthroplasty  Surgeon- Phillip Rankin. Lenin Kuhnle, MD  Assistant- Arcola Jansky, PA-C   Anesthesia-   Adductor canal block and spinal  EBL-100 ml   Drains None  Tourniquet time-  Total Tourniquet Time Documented: Thigh (Right) - 23 minutes Total: Thigh (Right) - 23 minutes     Complications- None  Condition-PACU - hemodynamically stable.   Brief Clinical Note  Phillip Powell. is a 77 y.o. year old male with end stage OA of his right knee with progressively worsening pain and dysfunction. He has constant pain, with activity and at rest and significant functional deficits with difficulties even with ADLs. He has had extensive non-op management including analgesics, injections of cortisone and viscosupplements, and home exercise program, but remains in significant pain with significant dysfunction. Radiographs show bone on bone arthritis medial and patellofemoral. He presents now for right Total Knee Arthroplasty.     Procedure in detail---   The patient is brought into the operating room and positioned supine on the operating table. After successful administration of  Adductor canal block and spinal,   a tourniquet is placed high on the  Right thigh(s) and the lower extremity is prepped and draped in the usual sterile fashion. Time out is performed by the operating team and then the  Right lower extremity is wrapped in Esmarch, knee flexed and the tourniquet inflated to 300 mmHg.       A midline incision is made with a ten blade through the subcutaneous tissue to the level of the extensor mechanism. A fresh blade is used to make a medial parapatellar arthrotomy. Soft tissue over the proximal medial tibia is subperiosteally elevated to the joint line with a knife and into the semimembranosus bursa with a  Cobb elevator. Soft tissue over the proximal lateral tibia is elevated with attention being paid to avoiding the patellar tendon on the tibial tubercle. The patella is everted, knee flexed 90 degrees and the ACL and PCL are removed. Findings are bone on bone medial and patellofemoral with massive global osteophytes        The drill is used to create a starting hole in the distal femur and the canal is thoroughly irrigated with sterile saline to remove the fatty contents. The 5 degree Right  valgus alignment guide is placed into the femoral canal and the distal femoral cutting block is pinned to remove 10 mm off the distal femur. Resection is made with an oscillating saw.      The tibia is subluxed forward and the menisci are removed. The extramedullary alignment guide is placed referencing proximally at the medial aspect of the tibial tubercle and distally along the second metatarsal axis and tibial crest. The block is pinned to remove 2mm off the more deficient medial  side. Resection is made with an oscillating saw. Size 7is the most appropriate size for the tibia and the proximal tibia is prepared with the modular drill and keel punch for that size.      The femoral sizing guide is placed and size 7 is most appropriate. Rotation is marked off the epicondylar axis and confirmed by creating a rectangular flexion gap at 90 degrees. The size 7 cutting block is pinned in this rotation and the anterior, posterior and chamfer cuts are made with the oscillating saw. The intercondylar block is then placed and that cut  is made.      Trial size 7 tibial component, trial size 7 posterior stabilized femur and a 8  mm posterior stabilized rotating platform insert trial is placed. Full extension is achieved with excellent varus/valgus and anterior/posterior balance throughout full range of motion. The patella is everted and thickness measured to be 24  mm. Free hand resection is taken to 14 mm, a 38 template is placed, lug  holes are drilled, trial patella is placed, and it tracks normally. Osteophytes are removed off the posterior femur with the trial in place. All trials are removed and the cut bone surfaces prepared with pulsatile lavage. Cement is mixed and once ready for implantation, the size 7 tibial implant, size  7 posterior stabilized femoral component, and the size 38 patella are cemented in place and the patella is held with the clamp. The trial insert is placed and the knee held in full extension. The tourniquet is not functional and is released after 23 minutes. The Exparel (20 ml mixed with 60 ml saline) is injected into the extensor mechanism, posterior capsule, medial and lateral gutters and subcutaneous tissues.  All extruded cement is removed and once the cement is hard the permanent 8 mm posterior stabilized rotating platform insert is placed into the tibial tray.      The wound is copiously irrigated with saline solution and the extensor mechanism closed with # 0 Stratofix suture. Flexion against gravity is 140 degrees and the patella tracks normally. Subcutaneous tissue is closed with 2.0 vicryl and subcuticular with running 4.0 Monocryl. The incision is cleaned and dried and steri-strips and a bulky sterile dressing are applied. The limb is placed into a knee immobilizer and the patient is awakened and transported to recovery in stable condition.      Please note that a surgical assistant was a medical necessity for this procedure in order to perform it in a safe and expeditious manner. Surgical assistant was necessary to retract the ligaments and vital neurovascular structures to prevent injury to them and also necessary for proper positioning of the limb to allow for anatomic placement of the prosthesis.   Phillip Rankin Viriginia Amendola, MD    07/02/2023, 9:42 AM

## 2023-07-02 NOTE — Anesthesia Postprocedure Evaluation (Signed)
Anesthesia Post Note  Patient: Phillip Powell.  Procedure(s) Performed: TOTAL KNEE ARTHROPLASTY (Right: Knee)     Patient location during evaluation: PACU Anesthesia Type: Regional, Spinal and MAC Level of consciousness: awake Pain management: pain level controlled Vital Signs Assessment: post-procedure vital signs reviewed and stable Respiratory status: spontaneous breathing, respiratory function stable and nonlabored ventilation Cardiovascular status: blood pressure returned to baseline and stable Postop Assessment: no headache, no backache and no apparent nausea or vomiting Anesthetic complications: no   No notable events documented.  Last Vitals:  Vitals:   07/02/23 1125 07/02/23 1138  BP: 118/63 124/68  Pulse: (!) 58 (!) 59  Resp: 11 14  Temp:  36.6 C  SpO2: 97% 94%    Last Pain:  Vitals:   07/02/23 1138  TempSrc: Oral  PainSc: 0-No pain                 Linton Rump

## 2023-07-02 NOTE — Anesthesia Procedure Notes (Signed)
Anesthesia Regional Block: Adductor canal block   Pre-Anesthetic Checklist: , timeout performed,  Correct Patient, Correct Site, Correct Laterality,  Correct Procedure, Correct Position, site marked,  Risks and benefits discussed,  Surgical consent,  Pre-op evaluation,  At surgeon's request and post-op pain management  Laterality: Right  Prep: chloraprep       Needles:  Injection technique: Single-shot  Needle Type: Echogenic Stimulator Needle     Needle Length: 9cm  Needle Gauge: 21     Additional Needles:   Procedures:,,,, ultrasound used (permanent image in chart),,    Narrative:  Start time: 07/02/2023 7:54 AM End time: 07/02/2023 7:56 AM Injection made incrementally with aspirations every 5 mL.  Performed by: Personally  Anesthesiologist: Linton Rump, MD  Additional Notes: Discussed risks and benefits of nerve block including, but not limited to, prolonged and/or permanent nerve injury involving sensory and/or motor function. Monitors were applied and a time-out was performed. The nerve and associated structures were visualized under ultrasound guidance. After negative aspiration, local anesthetic was slowly injected around the nerve. There was no evidence of high pressure during the procedure. There were no paresthesias. VSS remained stable and the patient tolerated the procedure well.

## 2023-07-02 NOTE — Anesthesia Procedure Notes (Signed)
Spinal  Patient location during procedure: OR Start time: 07/02/2023 8:30 AM End time: 07/02/2023 9:35 AM Reason for block: surgical anesthesia Staffing Performed: anesthesiologist  Anesthesiologist: Linton Rump, MD Performed by: Linton Rump, MD Authorized by: Linton Rump, MD   Preanesthetic Checklist Completed: patient identified, IV checked, site marked, risks and benefits discussed, surgical consent, monitors and equipment checked, pre-op evaluation and timeout performed Spinal Block Patient position: sitting Prep: DuraPrep Patient monitoring: blood pressure and continuous pulse ox Approach: midline Location: L3-4 Injection technique: single-shot Needle Needle type: Pencan  Needle gauge: 24 G Needle length: 9 cm Additional Notes Risks and benefits of neuraxial anesthesia including, but not limited to, infection, bleeding, local anesthetic toxicity, headache, hypotension, back pain, block failure, etc. were discussed with the patient. The patient expressed understanding and consented to the procedure. I confirmed that the patient has no bleeding disorders and is not taking blood thinners. I confirmed the patient's last platelet count with the nurse. Monitors were applied. A time-out was performed immediately prior to the procedure. Sterile technique was used throughout the whole procedure.

## 2023-07-02 NOTE — Progress Notes (Signed)
   07/02/23 2225  BiPAP/CPAP/SIPAP  $ Non-Invasive Home Ventilator  Initial  BiPAP/CPAP/SIPAP Pt Type Adult  BiPAP/CPAP/SIPAP DREAMSTATIOND (Our Unit)  Mask Type Full face mask (Pts. own Full Face mask)  Respiratory Rate 18 breaths/min  FiO2 (%) 21 %  Flow Rate 0 lpm  Patient Home Equipment Yes (Only Hose and Full Face Mask)  Auto Titrate Yes (<4/>6-Per pt.)  CPAP/SIPAP surface wiped down Yes   Pt. made aware to notify if needed.

## 2023-07-02 NOTE — Discharge Instructions (Addendum)
Phillip Gross, MD Total Joint Specialist [facility] [address], [address] [place], [place] [address] [phone]  TOTAL KNEE REPLACEMENT POSTOPERATIVE DIRECTIONS    Knee Rehabilitation, Guidelines Following Surgery  Results after knee surgery are often greatly improved when you follow the exercise, range of motion and muscle strengthening exercises prescribed by your doctor. Safety measures are also important to protect the knee from further injury. If any of these exercises cause you to have increased pain or swelling in your knee joint, decrease the amount until you are comfortable again and slowly increase them. If you have problems or questions, call your caregiver or physical therapist for advice.   BLOOD CLOT PREVENTION Take a 2.5 mg Eliquis twice a day for three weeks following surgery. Then discontinue. You may resume your vitamins/supplements once you have discontinued the Eliquis. Do not take any NSAIDs (Advil, Aleve, Ibuprofen, Meloxicam, etc.) until you have discontinued the Eliquis.  HOME CARE INSTRUCTIONS  Remove items at home which could result in a fall. This includes throw rugs or furniture in walking pathways.  ICE to the affected knee as much as tolerated. Icing helps control swelling. If the swelling is well controlled you will be more comfortable and rehab easier. Continue to use ice on the knee for pain and swelling from surgery. You may notice swelling that will progress down to the foot and ankle. This is normal after surgery. Elevate the leg when you are not up walking on it.    Continue to use the breathing machine which will help keep your temperature down. It is common for your temperature to cycle up and down following surgery, especially at night when you are not up moving around and exerting yourself. The breathing machine keeps your lungs expanded and your temperature down. Do not place pillow under the operative knee, focus on keeping  the knee straight while resting  DIET You may resume your previous home diet once you are discharged from the hospital.  DRESSING / WOUND CARE / SHOWERING Keep your bulky bandage on for 2 days. On the third post-operative day you may remove the Ace bandage and gauze. There is a waterproof adhesive bandage on your skin which will stay in place until your first follow-up appointment. Once you remove this you will not need to place another bandage You may begin showering 3 days following surgery, but do not submerge the incision under water.  ACTIVITY For the first 5 days, the key is rest and control of pain and swelling Do your home exercises twice a day starting on post-operative day 3. On the days you go to physical therapy, just do the home exercises once that day. You should rest, ice and elevate the leg for 50 minutes out of every hour. Get up and walk/stretch for 10 minutes per hour. After 5 days you can increase your activity slowly as tolerated. Walk with your walker as instructed. Use the walker until you are comfortable transitioning to a cane. Walk with the cane in the opposite hand of the operative leg. You may discontinue the cane once you are comfortable and walking steadily. Avoid periods of inactivity such as sitting longer than an hour when not asleep. This helps prevent blood clots.  You may discontinue the knee immobilizer once you are able to perform a straight leg raise while lying down. You may resume a sexual relationship in one month or when given the OK by your doctor.  You may return to work once  you are cleared by your doctor.  Do not drive a car for 6 weeks or until released by your surgeon.  Do not drive while taking narcotics.  TED HOSE STOCKINGS Wear the elastic stockings on both legs for three weeks following surgery during the day. You may remove them at night for sleeping.  WEIGHT BEARING Weight bearing as tolerated with assist device (walker, cane, etc) as  directed, use it as long as suggested by your surgeon or therapist, typically at least 4-6 weeks.  POSTOPERATIVE CONSTIPATION PROTOCOL Constipation - defined medically as fewer than three stools per week and severe constipation as less than one stool per week.  One of the most common issues patients have following surgery is constipation.  Even if you have a regular bowel pattern at home, your normal regimen is likely to be disrupted due to multiple reasons following surgery.  Combination of anesthesia, postoperative narcotics, change in appetite and fluid intake all can affect your bowels.  In order to avoid complications following surgery, here are some recommendations in order to help you during your recovery period.  Colace (docusate) - Pick up an over-the-counter form of Colace or another stool softener and take twice a day as long as you are requiring postoperative pain medications.  Take with a full glass of water daily.  If you experience loose stools or diarrhea, hold the colace until you stool forms back up. If your symptoms do not get better within 1 week or if they get worse, check with your doctor. Dulcolax (bisacodyl) - Pick up over-the-counter and take as directed by the product packaging as needed to assist with the movement of your bowels.  Take with a full glass of water.  Use this product as needed if not relieved by Colace only.  MiraLax (polyethylene glycol) - Pick up over-the-counter to have on hand. MiraLax is a solution that will increase the amount of water in your bowels to assist with bowel movements.  Take as directed and can mix with a glass of water, juice, soda, coffee, or tea. Take if you go more than two days without a movement. Do not use MiraLax more than once per day. Call your doctor if you are still constipated or irregular after using this medication for 7 days in a row.  If you continue to have problems with postoperative constipation, please contact the office for  further assistance and recommendations.  If you experience "the worst abdominal pain ever" or develop nausea or vomiting, please contact the office immediatly for further recommendations for treatment.  ITCHING If you experience itching with your medications, try taking only a single pain pill, or even half a pain pill at a time.  You can also use Benadryl over the counter for itching or also to help with sleep.   MEDICATIONS See your medication summary on the "After Visit Summary" that the nursing staff will review with you prior to discharge.  You may have some home medications which will be placed on hold until you complete the course of blood thinner medication.  It is important for you to complete the blood thinner medication as prescribed by your surgeon.  Continue your approved medications as instructed at time of discharge.  PRECAUTIONS If you experience chest pain or shortness of breath - call 911 immediately for transfer to the hospital emergency department.  If you develop a fever greater that 101 F, purulent drainage from wound, increased redness or drainage from wound, foul odor from the wound/dressing,  or calf pain - CONTACT YOUR SURGEON.                                                   FOLLOW-UP APPOINTMENTS Make sure you keep all of your appointments after your operation with your surgeon and caregivers. You should call the office at the above phone number and make an appointment for approximately two weeks after the date of your surgery or on the date instructed by your surgeon outlined in the "After Visit Summary".  RANGE OF MOTION AND STRENGTHENING EXERCISES  Rehabilitation of the knee is important following a knee injury or an operation. After just a few days of immobilization, the muscles of the thigh which control the knee become weakened and shrink (atrophy). Knee exercises are designed to build up the tone and strength of the thigh muscles and to improve knee motion. Often  times heat used for twenty to thirty minutes before working out will loosen up your tissues and help with improving the range of motion but do not use heat for the first two weeks following surgery. These exercises can be done on a training (exercise) mat, on the floor, on a table or on a bed. Use what ever works the best and is most comfortable for you Knee exercises include:  Leg Lifts - While your knee is still immobilized in a splint or cast, you can do straight leg raises. Lift the leg to 60 degrees, hold for 3 sec, and slowly lower the leg. Repeat 10-20 times 2-3 times daily. Perform this exercise against resistance later as your knee gets better.  Quad and Hamstring Sets - Tighten up the muscle on the front of the thigh (Quad) and hold for 5-10 sec. Repeat this 10-20 times hourly. Hamstring sets are done by pushing the foot backward against an object and holding for 5-10 sec. Repeat as with quad sets.  Leg Slides: Lying on your back, slowly slide your foot toward your buttocks, bending your knee up off the floor (only go as far as is comfortable). Then slowly slide your foot back down until your leg is flat on the floor again. Angel Wings: Lying on your back spread your legs to the side as far apart as you can without causing discomfort.  A rehabilitation program following serious knee injuries can speed recovery and prevent re-injury in the future due to weakened muscles. Contact your doctor or a physical therapist for more information on knee rehabilitation.   POST-OPERATIVE OPIOID TAPER INSTRUCTIONS: It is important to wean off of your opioid medication as soon as possible. If you do not need pain medication after your surgery it is ok to stop day one. Opioids include: Codeine, Hydrocodone(Norco, Vicodin), Oxycodone(Percocet, oxycontin) and hydromorphone amongst others.  Long term and even short term use of opiods can cause: Increased pain  response Dependence Constipation Depression Respiratory depression And more.  Withdrawal symptoms can include Flu like symptoms Nausea, vomiting And more Techniques to manage these symptoms Hydrate well Eat regular healthy meals Stay active Use relaxation techniques(deep breathing, meditating, yoga) Do Not substitute Alcohol to help with tapering If you have been on opioids for less than two weeks and do not have pain than it is ok to stop all together.  Plan to wean off of opioids This plan should start within one week post op of  your joint replacement. Maintain the same interval or time between taking each dose and first decrease the dose.  Cut the total daily intake of opioids by one tablet each day Next start to increase the time between doses. The last dose that should be eliminated is the evening dose.   IF YOU ARE TRANSFERRED TO A SKILLED REHAB FACILITY If the patient is transferred to a skilled rehab facility following release from the hospital, a list of the current medications will be sent to the facility for the patient to continue.  When discharged from the skilled rehab facility, please have the facility set up the patient's Home Health Physical Therapy prior to being released. Also, the skilled facility will be responsible for providing the patient with their medications at time of release from the facility to include their pain medication, the muscle relaxants, and their blood thinner medication. If the patient is still at the rehab facility at time of the two week follow up appointment, the skilled rehab facility will also need to assist the patient in arranging follow up appointment in our office and any transportation needs.  MAKE SURE YOU:  Understand these instructions.  Get help right away if you are not doing well or get worse.   DENTAL ANTIBIOTICS:  In most cases prophylactic antibiotics for Dental procdeures after total joint surgery are not  necessary.  Exceptions are as follows:  1. History of prior total joint infection  2. Severely immunocompromised (Organ Transplant, cancer chemotherapy, Rheumatoid biologic medications such as Humera)  3. Poorly controlled diabetes (A1C &gt; 8.0, blood glucose over 200)  If you have one of these conditions, contact your surgeon for an antibiotic prescription, prior to your dental procedure.    Pick up stool softner and laxative for home use following surgery while on pain medications. Do not submerge incision under water. Please use good hand washing techniques while changing dressing each day. May shower starting three days after surgery. Please use a clean towel to pat the incision dry following showers. Continue to use ice for pain and swelling after surgery. Do not use any lotions or creams on the incision until instructed by your surgeon.  Information on my medicine - ELIQUIS (apixaban)  This medication education was reviewed with me or my healthcare representative as part of my discharge preparation.  Why was Eliquis prescribed for you? Eliquis was prescribed for you to reduce the risk of blood clots forming after orthopedic surgery.    What do You need to know about Eliquis? Take your Eliquis TWICE DAILY - one tablet in the morning and one tablet in the evening with or without food.  It would be best to take the dose about the same time each day.  If you have difficulty swallowing the tablet whole please discuss with your pharmacist how to take the medication safely.  Take Eliquis exactly as prescribed by your doctor and DO NOT stop taking Eliquis without talking to the doctor who prescribed the medication.  Stopping without other medication to take the place of Eliquis may increase your risk of developing a clot.  After discharge, you should have regular check-up appointments with your healthcare provider that is prescribing your Eliquis.  What do you do if you  miss a dose? If a dose of ELIQUIS is not taken at the scheduled time, take it as soon as possible on the same day and twice-daily administration should be resumed.  The dose should not be doubled to make up for  a missed dose.  Do not take more than one tablet of ELIQUIS at the same time.  Important Safety Information A possible side effect of Eliquis is bleeding. You should call your healthcare provider right away if you experience any of the following: Bleeding from an injury or your nose that does not stop. Unusual colored urine (red or dark brown) or unusual colored stools (red or black). Unusual bruising for unknown reasons. A serious fall or if you hit your head (even if there is no bleeding).  Some medicines may interact with Eliquis and might increase your risk of bleeding or clotting while on Eliquis. To help avoid this, consult your healthcare provider or pharmacist prior to using any new prescription or non-prescription medications, including herbals, vitamins, non-steroidal anti-inflammatory drugs (NSAIDs) and supplements.  This website has more information on Eliquis (apixaban): http://www.eliquis.com/eliquis/home

## 2023-07-02 NOTE — Interval H&P Note (Signed)
History and Physical Interval Note:  07/02/2023 6:29 AM  Phillip Powell.  has presented today for surgery, with the diagnosis of right knee osteoarthritis.  The various methods of treatment have been discussed with the patient and family. After consideration of risks, benefits and other options for treatment, the patient has consented to  Procedure(s): TOTAL KNEE ARTHROPLASTY (Right) as a surgical intervention.  The patient's history has been reviewed, patient examined, no change in status, stable for surgery.  I have reviewed the patient's chart and labs.  Questions were answered to the patient's satisfaction.     Homero Fellers Ghada Abbett

## 2023-07-02 NOTE — Progress Notes (Signed)
Orthopedic Tech Progress Note Patient Details:  Phillip Powell [birth year] [id number]  Applied CPM. CPM Right Knee CPM Right Knee: On Right Knee Flexion (Degrees): 40 Right Knee Extension (Degrees): 10  Post Interventions Patient Tolerated: Well Instructions Provided: Care of device, Adjustment of device  Sherilyn Banker 07/02/2023, 10:54 AM

## 2023-07-02 NOTE — Transfer of Care (Signed)
Immediate Anesthesia Transfer of Care Note  Patient: Phillip Powell.  Procedure(s) Performed: TOTAL KNEE ARTHROPLASTY (Right: Knee)  Patient Location: PACU  Anesthesia Type:General and Spinal  Level of Consciousness: awake, alert , and oriented  Airway & Oxygen Therapy: Patient Spontanous Breathing  Post-op Assessment: Report given to RN and Post -op Vital signs reviewed and stable  Post vital signs: Reviewed and stable  Last Vitals:  Vitals Value Taken Time  BP 102/60 07/02/23 1004  Temp    Pulse 63 07/02/23 1006  Resp 18 07/02/23 1006  SpO2 93 % 07/02/23 1006  Vitals shown include unfiled device data.  Last Pain:  Vitals:   07/02/23 0702  TempSrc:   PainSc: 3          Complications: No notable events documented.

## 2023-07-02 NOTE — Evaluation (Signed)
Physical Therapy Evaluation Patient Details Name: Phillip Powell. MRN: [id number] DOB: December 25, 1945 Today's Date: 07/02/2023  History of Present Illness  77 yo male s/p R TKA on 07/02/23. PMH: DM, HTN, OSA, BPH, OA, obesity, L TKA  Clinical Impression  Pt is s/p TKA resulting in the deficits listed below (see PT Problem List).  R knee buckling with wt shifting likely d/t residual effects of adductor canal block.  Pt able to take lateral steps along EOB, pt declined OOB to chair; amb deferrred by PT for pt safety  Anticipate steady progress once effects of regional block fully resolve   Pt will benefit from acute skilled PT to increase their independence and safety with mobility to allow discharge.          If plan is discharge home, recommend the following: A little help with walking and/or transfers;A little help with bathing/dressing/bathroom;Help with stairs or ramp for entrance;Assist for transportation   Can travel by private vehicle        Equipment Recommendations Rolling walker (2 wheels)  Recommendations for Other Services       Functional Status Assessment Patient has had a recent decline in their functional status and demonstrates the ability to make significant improvements in function in a reasonable and predictable amount of time.     Precautions / Restrictions Precautions Precautions: Knee;Fall Precaution Comments: had one fall after previous TKA      Mobility  Bed Mobility Overal bed mobility: Needs Assistance Bed Mobility: Supine to Sit, Sit to Supine     Supine to sit: Contact guard Sit to supine: Contact guard assist   General bed mobility comments: for safety    Transfers Overall transfer level: Needs assistance Equipment used: Rolling walker (2 wheels) Transfers: Sit to/from Stand Sit to Stand: Min assist           General transfer comment: assist to rise and transition to RW, R knee buckling with wt shifting. able to take lateral  steps along EOB, declined OOB to chair , amb deferrred by PT for pt safety    Ambulation/Gait                  Stairs            Wheelchair Mobility     Tilt Bed    Modified Rankin (Stroke Patients Only)       Balance Overall balance assessment: Needs assistance Sitting-balance support: Feet supported, No upper extremity supported Sitting balance-Leahy Scale: Good       Standing balance-Leahy Scale: Poor                               Pertinent Vitals/Pain Pain Assessment Pain Assessment: Faces Faces Pain Scale: Hurts little more Pain Location: right knee Pain Descriptors / Indicators: Grimacing, Sore Pain Intervention(s): Limited activity within patient's tolerance, Monitored during session, Premedicated before session, Repositioned    Home Living Family/patient expects to be discharged to:: Private residence Living Arrangements: Spouse/significant other Available Help at Discharge: Family;Available 24 hours/day Type of Home: House Home Access: Ramped entrance       Home Layout: One level        Prior Function Prior Level of Function : Independent/Modified Independent                     Extremity/Trunk Assessment   Upper Extremity Assessment Upper Extremity Assessment: Overall WFL for tasks assessed ("bad  shoulders" at baseline")    Lower Extremity Assessment Lower Extremity Assessment: RLE deficits/detail RLE Deficits / Details: ankle WFL, SLR with10 degree lag, grossly 2+ to 3/5       Communication   Communication Communication: No apparent difficulties  Cognition Arousal: Alert Behavior During Therapy: WFL for tasks assessed/performed Overall Cognitive Status: Within Functional Limits for tasks assessed                                          General Comments      Exercises Total Joint Exercises Ankle Circles/Pumps: AROM, Both, 10 reps Quad Sets: Both, 5 reps, AROM Straight Leg  Raises: 5 reps, Right, AROM   Assessment/Plan    PT Assessment Patient needs continued PT services  PT Problem List Decreased strength;Decreased activity tolerance;Decreased balance;Decreased mobility;Decreased knowledge of precautions;Decreased knowledge of use of DME       PT Treatment Interventions DME instruction;Gait training;Functional mobility training;Therapeutic activities;Therapeutic exercise;Patient/family education    PT Goals (Current goals can be found in the Care Plan section)  Acute Rehab PT Goals PT Goal Formulation: With patient Time For Goal Achievement: 07/09/23 Potential to Achieve Goals: Good    Frequency 7X/week     Co-evaluation               AM-PAC PT "6 Clicks" Mobility  Outcome Measure Help needed turning from your back to your side while in a flat bed without using bedrails?: A Little Help needed moving from lying on your back to sitting on the side of a flat bed without using bedrails?: A Little Help needed moving to and from a bed to a chair (including a wheelchair)?: A Lot Help needed standing up from a chair using your arms (e.g., wheelchair or bedside chair)?: A Little Help needed to walk in hospital room?: Total Help needed climbing 3-5 steps with a railing? : Total 6 Click Score: 13    End of Session Equipment Utilized During Treatment: Gait belt;Right knee immobilizer Activity Tolerance: Patient tolerated treatment well Patient left: in bed;with call bell/phone within reach;with bed alarm set;with family/visitor present Nurse Communication: Mobility status PT Visit Diagnosis: Other abnormalities of gait and mobility (R26.89);Difficulty in walking, not elsewhere classified (R26.2)    Time: 1536-1600 PT Time Calculation (min) (ACUTE ONLY): 24 min   Charges:   PT Evaluation $PT Eval Low Complexity: 1 Low PT Treatments $Therapeutic Activity: 8-22 mins PT General Charges $$ ACUTE PT VISIT: 1 Visit         Delynn Olvera, PT  Acute  Rehab Dept [facility] [phone]  07/02/2023   [facility] 07/02/2023, 4:23 PM

## 2023-07-02 NOTE — Progress Notes (Signed)
Pt's CBG 415, reached out to the on call for [facility]. He suggested to administer 4 units from sliding scale and recheck after 1 hour of administration.   Ignacia Bayley, RN

## 2023-07-03 ENCOUNTER — Other Ambulatory Visit (HOSPITAL_COMMUNITY): Payer: Self-pay

## 2023-07-03 ENCOUNTER — Encounter (HOSPITAL_COMMUNITY): Payer: Self-pay | Admitting: Orthopedic Surgery

## 2023-07-03 DIAGNOSIS — M1711 Unilateral primary osteoarthritis, right knee: Secondary | ICD-10-CM | POA: Diagnosis not present

## 2023-07-03 LAB — BASIC METABOLIC PANEL
Anion gap: 12 (ref 5–15)
BUN: 22 mg/dL (ref 8–23)
CO2: 25 mmol/L (ref 22–32)
Calcium: 8.6 mg/dL — ABNORMAL LOW (ref 8.9–10.3)
Chloride: 99 mmol/L (ref 98–111)
Creatinine, Ser: 1.12 mg/dL (ref 0.61–1.24)
GFR, Estimated: >60 mL/min (ref >60)
Glucose, Bld: 260 mg/dL — ABNORMAL HIGH (ref 70–99)
Potassium: 3.7 mmol/L (ref 3.5–5.1)
Sodium: 136 mmol/L (ref 135–145)

## 2023-07-03 LAB — GLUCOSE, CAPILLARY
Glucose-Capillary: 261 mg/dL — ABNORMAL HIGH (ref 70–99)
Glucose-Capillary: 321 mg/dL — ABNORMAL HIGH (ref 70–99)

## 2023-07-03 LAB — CBC
HCT: 38.4 % — ABNORMAL LOW (ref 39.0–52.0)
Hemoglobin: 12.6 g/dL — ABNORMAL LOW (ref 13.0–17.0)
MCH: 26.8 pg (ref 26.0–34.0)
MCHC: 32.8 g/dL (ref 30.0–36.0)
MCV: 81.7 fL (ref 80.0–100.0)
Platelets: 199 10*3/uL (ref 150–400)
RBC: 4.7 MIL/uL (ref 4.22–5.81)
RDW: 13.6 % (ref 11.5–15.5)
WBC: 12.5 10*3/uL — ABNORMAL HIGH (ref 4.0–10.5)
nRBC: 0 % (ref 0.0–0.2)

## 2023-07-03 MED ORDER — OXYCODONE HCL 5 MG PO TABS
5.0000 mg | ORAL_TABLET | Freq: Four times a day (QID) | ORAL | 0 refills | Status: DC | PRN
  Filled 2023-07-03: qty 42, 6d supply, fill #0

## 2023-07-03 MED ORDER — TRAMADOL HCL 50 MG PO TABS
50.0000 mg | ORAL_TABLET | Freq: Four times a day (QID) | ORAL | 0 refills | Status: DC | PRN
  Filled 2023-07-03: qty 40, 5d supply, fill #0

## 2023-07-03 MED ORDER — ONDANSETRON HCL 4 MG PO TABS
4.0000 mg | ORAL_TABLET | Freq: Four times a day (QID) | ORAL | 0 refills | Status: AC | PRN
  Filled 2023-07-03: qty 20, 5d supply, fill #0

## 2023-07-03 MED ORDER — APIXABAN 2.5 MG PO TABS
2.5000 mg | ORAL_TABLET | Freq: Two times a day (BID) | ORAL | 0 refills | Status: AC
  Filled 2023-07-03: qty 40, 20d supply, fill #0

## 2023-07-03 MED ORDER — METHOCARBAMOL 500 MG PO TABS
500.0000 mg | ORAL_TABLET | Freq: Four times a day (QID) | ORAL | 0 refills | Status: DC | PRN
  Filled 2023-07-03: qty 40, 10d supply, fill #0

## 2023-07-03 NOTE — TOC Transition Note (Signed)
Transition of Care [facility]) - CM/SW Discharge Note   Patient Details  Name: Phillip Powell. MRN: [id number] Date of Birth: [birth year]  Transition of Care [facility]) CM/SW Contact:  Amada Jupiter, LCSW Phone Number: 07/03/2023, 12:06 PM   Clinical Narrative:     Met with pt and confirming he has received RW to room via [employer].  OPPT already arranged with [facility].  No further TOC needs.  Final next level of care: OP Rehab Barriers to Discharge: No Barriers Identified   Patient Goals and CMS Choice      Discharge Placement                         Discharge Plan and Services Additional resources added to the After Visit Summary for                  DME Arranged: Walker rolling DME Agency: [employer]                  Social Determinants of Health (SDOH) Interventions SDOH Screenings   Food Insecurity: No Food Insecurity (07/02/2023)  Housing: Low Risk  (07/02/2023)  Transportation Needs: No Transportation Needs (07/02/2023)  Utilities: Not At Risk (07/02/2023)  Depression (PHQ2-9): Low Risk  (09/25/2022)  Tobacco Use: Low Risk  (07/02/2023)     Readmission Risk Interventions     No data to display

## 2023-07-03 NOTE — Progress Notes (Signed)
Physical Therapy Treatment Patient Details Name: Phillip Powell. MRN: [id number] DOB: 10-27-1945 Today's Date: 07/03/2023   History of Present Illness 77 yo male s/p R TKA on 07/02/23. PMH: DM, HTN, OSA, BPH, OA, obesity, L TKA    PT Comments  Pt making excellent progress this session. Reviewed TKA exercises and pt amb  100', pain controlled. Pt has ramp at home and son will be assisting with home entry. Pt is ready to d/c with family assistance as needed. OPPT appt 07/03/20     If plan is discharge home, recommend the following: A little help with walking and/or transfers;A little help with bathing/dressing/bathroom;Help with stairs or ramp for entrance;Assist for transportation   Can travel by private vehicle        Equipment Recommendations  Rolling walker (2 wheels)    Recommendations for Other Services       Precautions / Restrictions Precautions Precautions: Knee;Fall Precaution Booklet Issued: Yes (comment) Precaution Comments: had one fall after previous TKA Restrictions Weight Bearing Restrictions: No RLE Weight Bearing: Weight bearing as tolerated Other Position/Activity Restrictions: independent SLRs today, KI not used     Mobility  Bed Mobility               General bed mobility comments: received in recliner    Transfers Overall transfer level: Needs assistance Equipment used: Rolling walker (2 wheels) Transfers: Sit to/from Stand Sit to Stand: Contact guard assist           General transfer comment: cues for hand placement, CGA for safety    Ambulation/Gait Ambulation/Gait assistance: Contact guard assist, Supervision Gait Distance (Feet): 100 Feet Assistive device: Rolling walker (2 wheels) Gait Pattern/deviations: Step-to pattern, Step-through pattern       General Gait Details: cues for sequence and RW position, beginning step through pattern with improved wt shift to RLE   Stairs             Wheelchair Mobility      Tilt Bed    Modified Rankin (Stroke Patients Only)       Balance     Sitting balance-Leahy Scale: Good       Standing balance-Leahy Scale: Poor (reliant on UEs/device)                              Cognition Arousal: Alert Behavior During Therapy: WFL for tasks assessed/performed Overall Cognitive Status: Within Functional Limits for tasks assessed                                          Exercises Total Joint Exercises Ankle Circles/Pumps: AROM, Both, 10 reps Quad Sets: AROM, Both, 10 reps Heel Slides: AAROM, Right, 10 reps Straight Leg Raises: 5 reps, Right, AROM    General Comments        Pertinent Vitals/Pain Pain Assessment Pain Assessment: Faces Faces Pain Scale: Hurts even more Pain Location: right knee with motion Pain Descriptors / Indicators: Grimacing, Sore Pain Intervention(s): Limited activity within patient's tolerance, Monitored during session, Premedicated before session, Repositioned    Home Living                          Prior Function            PT Goals (current goals can now be found in  the care plan section) Acute Rehab PT Goals PT Goal Formulation: With patient Time For Goal Achievement: 07/09/23 Potential to Achieve Goals: Good Progress towards PT goals: Progressing toward goals    Frequency    7X/week      PT Plan      Co-evaluation              AM-PAC PT "6 Clicks" Mobility   Outcome Measure  Help needed turning from your back to your side while in a flat bed without using bedrails?: A Little Help needed moving from lying on your back to sitting on the side of a flat bed without using bedrails?: A Little Help needed moving to and from a bed to a chair (including a wheelchair)?: A Little Help needed standing up from a chair using your arms (e.g., wheelchair or bedside chair)?: A Little Help needed to walk in hospital room?: A Little Help needed climbing 3-5 steps with  a railing? : A Little 6 Click Score: 18    End of Session Equipment Utilized During Treatment: Gait belt;Right knee immobilizer Activity Tolerance: Patient tolerated treatment well Patient left: in chair;with call bell/phone within reach;with family/visitor present (alarm no engaged on PT arrival)   PT Visit Diagnosis: Other abnormalities of gait and mobility (R26.89);Difficulty in walking, not elsewhere classified (R26.2)     Time: 4098-1191 PT Time Calculation (min) (ACUTE ONLY): 17 min  Charges:    $Gait Training: 8-22 mins PT General Charges $$ ACUTE PT VISIT: 1 Visit                     Khyson Sebesta, PT  Acute Rehab Dept [facility]) [phone]  07/03/2023    [facility] 07/03/2023, 11:28 AM

## 2023-07-03 NOTE — Progress Notes (Signed)
Subjective: 1 Day Post-Op Procedure(s) (LRB): TOTAL KNEE ARTHROPLASTY (Right) Patient reports pain as mild.   Patient seen in rounds by Dr. Lequita Halt. Patient is well, and has had no acute complaints or problems No issues overnight. Denies chest pain, SOB, or calf pain. Foley catheter removed this AM.  We will continue therapy today  Objective: Vital signs in last 24 hours: Temp:  [97.5 F (36.4 C)-99.5 F (37.5 C)] 97.7 F (36.5 C) (12/10 0414) Pulse Rate:  [57-79] 71 (12/10 0414) Resp:  [11-21] 16 (12/10 0414) BP: (99-149)/(60-83) 137/83 (12/10 0414) SpO2:  [92 %-98 %] 98 % (12/10 0414) FiO2 (%):  [21 %] 21 % (12/09 2225)  Intake/Output from previous day:  Intake/Output Summary (Last 24 hours) at 07/03/2023 0834 Last data filed at 07/03/2023 0637 Gross per 24 hour  Intake 3429.32 ml  Output 3295 ml  Net 134.32 ml     Intake/Output this shift: No intake/output data recorded.  Labs: Recent Labs    07/03/23 0320  HGB 12.6*   Recent Labs    07/03/23 0320  WBC 12.5*  RBC 4.70  HCT 38.4*  PLT 199   Recent Labs    07/03/23 0320  NA 136  K 3.7  CL 99  CO2 25  BUN 22  CREATININE 1.12  GLUCOSE 260*  CALCIUM 8.6*   No results for input(s): "LABPT", "INR" in the last 72 hours.  Exam: General - Patient is Alert and Oriented Extremity - Neurologically intact Neurovascular intact Sensation intact distally Dorsiflexion/Plantar flexion intact Dressing - dressing C/D/I Motor Function - intact, moving foot and toes well on exam.   Past Medical History:  Diagnosis Date    Covid-19 Positive (02/06/2019)  02/11/2019   Arthritis    BPH (benign prostatic hypertrophy)    Diabetes mellitus without complication (HCC)    type 2   GERD (gastroesophageal reflux disease)    meds as needed   Hepatic steatosis    History of kidney stones    Hydroureteronephrosis    Hypertension    Hypogonadism male    Mass    left shoulder AC joint   OSA (obstructive sleep  apnea)    wears CPAP wears 4 hours per nite    PONV (postoperative nausea and vomiting)    Renal calculi    Sarcoidosis    1985   Vitamin D deficiency     Assessment/Plan: 1 Day Post-Op Procedure(s) (LRB): TOTAL KNEE ARTHROPLASTY (Right) Principal Problem:   Osteoarthritis of right knee  Estimated body mass index is 32.52 kg/m as calculated from the following:   Height as of this encounter: 5\' 8"  (1.727 m).   Weight as of this encounter: 97 kg. Advance diet Up with therapy D/C IV fluids   Patient's anticipated LOS is less than 2 midnights, meeting these requirements: - Lives within 1 hour of care - Has a competent adult at home to recover with post-op recover - NO history of             - Chronic pain requiring opioids             - Coronary Artery Disease             - Heart failure             - Heart attack             - Stroke             - DVT/VTE             -  Cardiac arrhythmia             - Respiratory Failure/COPD             - Anemia             - Advanced Liver disease     DVT Prophylaxis -  Eliquis Weight bearing as tolerated. Continue therapy.  Plan is to go Home after hospital stay. Plan for discharge later today if progresses with therapy and meeting goals. Scheduled for OPPT at [facility]. Follow-up in the office in 2 weeks.  The PDMP database was reviewed today prior to any opioid medications being prescribed to this patient.  Arther Abbott, PA-C Orthopedic Surgery [phone] 07/03/2023, 8:34 AM

## 2023-07-03 NOTE — Plan of Care (Signed)
Discharge instructions given to the patient and his wife including medications and follow up.  

## 2023-07-05 ENCOUNTER — Other Ambulatory Visit (HOSPITAL_COMMUNITY): Payer: Self-pay

## 2023-07-09 NOTE — Discharge Summary (Signed)
Patient ID: Phillip Powell. MRN: [id number] DOB/AGE: 1946/04/10 77 y.o.  Admit date: 07/02/2023 Discharge date: 07/03/2023  Admission Diagnoses:  Principal Problem:   Osteoarthritis of right knee   Discharge Diagnoses:  Same  Past Medical History:  Diagnosis Date    Covid-19 Positive (02/06/2019)  02/11/2019   Arthritis    BPH (benign prostatic hypertrophy)    Diabetes mellitus without complication (HCC)    type 2   GERD (gastroesophageal reflux disease)    meds as needed   Hepatic steatosis    History of kidney stones    Hydroureteronephrosis    Hypertension    Hypogonadism male    Mass    left shoulder AC joint   OSA (obstructive sleep apnea)    wears CPAP wears 4 hours per nite    PONV (postoperative nausea and vomiting)    Renal calculi    Sarcoidosis    1985   Vitamin D deficiency     Surgeries: Procedure(s): TOTAL KNEE ARTHROPLASTY on 07/02/2023   Consultants:   Discharged Condition: Improved  Hospital Course: Damiean Mitsch. is an 77 y.o. male who was admitted 07/02/2023 for operative treatment ofOsteoarthritis of right knee. Patient has severe unremitting pain that affects sleep, daily activities, and work/hobbies. After pre-op clearance the patient was taken to the operating room on 07/02/2023 and underwent  Procedure(s): TOTAL KNEE ARTHROPLASTY.    Patient was given perioperative antibiotics:  Anti-infectives (From admission, onward)    Start     Dose/Rate Route Frequency Ordered Stop   07/02/23 1430  ceFAZolin (ANCEF) IVPB 2g/100 mL premix        2 g 200 mL/hr over 30 Minutes Intravenous Every 6 hours 07/02/23 1130 07/02/23 2147   07/02/23 0600  ceFAZolin (ANCEF) IVPB 2g/100 mL premix        2 g 200 mL/hr over 30 Minutes Intravenous On call to O.R. 07/02/23 0559 07/02/23 0901        Patient was given sequential compression devices, early ambulation, and chemoprophylaxis to prevent DVT.  Patient benefited maximally from hospital stay  and there were no complications.    Recent vital signs: No data found.   Recent laboratory studies: No results for input(s): "WBC", "HGB", "HCT", "PLT", "NA", "K", "CL", "CO2", "BUN", "CREATININE", "GLUCOSE", "INR", "CALCIUM" in the last 72 hours.  Invalid input(s): "PT", "2"   Discharge Medications:   Allergies as of 07/03/2023       Reactions   Ace Inhibitors Other (See Comments)   Unknown   Ibuprofen Other (See Comments)   GI upset   Invokana [canagliflozin] Other (See Comments)   Unknown reaction   Quinidine Other (See Comments)   Unknown        Medication List     STOP taking these medications    CINNAMON PO   magnesium gluconate 500 MG tablet Commonly known as: MAGONATE   TURMERIC PO   Vitamin D3 1.25 MG (50000 UT) Tabs   Zinc 50 MG Tabs       TAKE these medications    acetaminophen 500 MG tablet Commonly known as: TYLENOL Take 1,000 mg by mouth every 6 (six) hours as needed for moderate pain.   amLODipine 10 MG tablet Commonly known as: NORVASC TAKE 1/2 TO 1 TABLET BY MOUTH DAILY FOR BLOOD PRESSURE What changed: See the new instructions.   atenolol 100 MG tablet Commonly known as: TENORMIN Take  1 tablet  Daily for BP                                                                                                          /  TAKE                                                                  BY                                                   MOUTH   B-D 3CC LUER-LOK SYR 21GX1" 21G X 1" 3 ML Misc Generic drug: SYRINGE-NEEDLE (DISP) 3 ML use as directed   blood glucose meter kit and supplies Kit Dispense based on  insurance coverage. Test blood sugar twice daily. #100 lancet and strips with 3 refills   Blood Glucose Monitoring Suppl Devi Use meter three times a day to check blood sugar   OneTouch Verio w/Device Kit Test blood sugar once daily.   Eliquis 2.5  MG Tabs tablet Generic drug: apixaban Take 1 tablet (2.5 mg total) by mouth every 12 (twelve) hours for 20 days.   famotidine 40 MG tablet Commonly known as: PEPCID TAKE 1 TABLET BY MOUTH AT BEDTIME FOR INDIGESTION OR ACID REFLUX What changed:  how much to take how to take this when to take this reasons to take this additional instructions   glyBURIDE 5 MG tablet Commonly known as: DIABETA Take 1/2 to 1 tablet  3 x /day  with Meals for Diabetes What changed:  how much to take how to take this when to take this reasons to take this additional instructions   hydrochlorothiazide 25 MG tablet Commonly known as: HYDRODIURIL TAKE 1 TABLET(25 MG) BY MOUTH DAILY   leflunomide 10 MG tablet Commonly known as: ARAVA Take 10 mg by mouth daily.   loperamide 2 MG tablet Commonly known as: IMODIUM A-D Take 2-4 mg by mouth 4 (four) times daily as needed for diarrhea or loose stools.   meclizine 25 MG tablet Commonly known as: ANTIVERT Take 25 mg by mouth 3 (three) times daily as needed for dizziness.   metFORMIN 500 MG 24 hr tablet Commonly known as: GLUCOPHAGE-XR TAKE 1 TO 2 TABLETS BY MOUTH TWICE DAILY WITH MEALS FOR DIABETES What changed:  how much to take how to take this when to take this additional instructions   methocarbamol 500 MG tablet Commonly known as: ROBAXIN Take 1 tablet (500 mg total) by mouth every 6 (six) hours as needed for muscle spasms.   ondansetron 4 MG tablet Commonly known as: ZOFRAN Take 1 tablet (4 mg total) by mouth every 6 (six) hours as needed for nausea.   onetouch ultrasoft lancets Test blood sugar once daily   OneTouch Verio test strip Generic drug: glucose blood Test sugars 3 x a day for hypoglycemia/hyperglycemia and fluctuating sugars   oxyCODONE 5 MG immediate release tablet Commonly known as: Oxy IR/ROXICODONE Take 1-2 tablets (5-10 mg total) by mouth every 6 (six) hours as needed for severe pain (pain score 7-10).    tamsulosin 0.4 MG Caps capsule Commonly known as: FLOMAX TAKE 1 CAPSULE BY MOUTH AT BEDTIME FOR PROSTATE   traMADol 50 MG tablet Commonly known as: ULTRAM Take 1-2 tablets (50-100 mg total) by mouth every 6 (six) hours as needed for moderate pain (pain  score 4-6).   Trulicity 0.75 MG/0.5ML Soaj Generic drug: Dulaglutide ADMINISTER 0.75 MG UNDER THE SKIN 1 TIME A WEEK               Discharge Care Instructions  (From admission, onward)           Start     Ordered   07/03/23 0000  Weight bearing as tolerated        07/03/23 0837   07/03/23 0000  Change dressing       Comments: You may remove the bulky bandage (ACE wrap and gauze) two days after surgery. You will have an adhesive waterproof bandage underneath. Leave this in place until your first follow-up appointment.   07/03/23 0837            Diagnostic Studies: No results found.  Disposition: Discharge disposition: 01-Home or Self Care       Discharge Instructions     Call MD / Call 911   Complete by: As directed    If you experience chest pain or shortness of breath, CALL 911 and be transported to the hospital emergency room.  If you develope a fever above 101 F, pus (white drainage) or increased drainage or redness at the wound, or calf pain, call your surgeon's office.   Change dressing   Complete by: As directed    You may remove the bulky bandage (ACE wrap and gauze) two days after surgery. You will have an adhesive waterproof bandage underneath. Leave this in place until your first follow-up appointment.   Constipation Prevention   Complete by: As directed    Drink plenty of fluids.  Prune juice may be helpful.  You may use a stool softener, such as Colace (over the counter) 100 mg twice a day.  Use MiraLax (over the counter) for constipation as needed.   Diet - low sodium heart healthy   Complete by: As directed    Do not put a pillow under the knee. Place it under the heel.   Complete by: As  directed    Driving restrictions   Complete by: As directed    No driving for two weeks   Post-operative opioid taper instructions:   Complete by: As directed    POST-OPERATIVE OPIOID TAPER INSTRUCTIONS: It is important to wean off of your opioid medication as soon as possible. If you do not need pain medication after your surgery it is ok to stop day one. Opioids include: Codeine, Hydrocodone(Norco, Vicodin), Oxycodone(Percocet, oxycontin) and hydromorphone amongst others.  Long term and even short term use of opiods can cause: Increased pain response Dependence Constipation Depression Respiratory depression And more.  Withdrawal symptoms can include Flu like symptoms Nausea, vomiting And more Techniques to manage these symptoms Hydrate well Eat regular healthy meals Stay active Use relaxation techniques(deep breathing, meditating, yoga) Do Not substitute Alcohol to help with tapering If you have been on opioids for less than two weeks and do not have pain than it is ok to stop all together.  Plan to wean off of opioids This plan should start within one week post op of your joint replacement. Maintain the same interval or time between taking each dose and first decrease the dose.  Cut the total daily intake of opioids by one tablet each day Next start to increase the time between doses. The last dose that should be eliminated is the evening dose.      TED hose   Complete by: As directed  Use stockings (TED hose) for three weeks on both leg(s).  You may remove them at night for sleeping.   Weight bearing as tolerated   Complete by: As directed         Follow-up Information     Aluisio, Homero Fellers, MD. Schedule an appointment as soon as possible for a visit in 2 week(s).   Specialty: Orthopedic Surgery Contact information: [address] [place] [address] [phone]                  Signed: Arther Abbott 07/09/2023, 8:21  AM

## 2023-07-19 ENCOUNTER — Other Ambulatory Visit: Payer: Self-pay | Admitting: Gastroenterology

## 2023-07-23 ENCOUNTER — Other Ambulatory Visit: Payer: Self-pay | Admitting: Nurse Practitioner

## 2023-07-23 DIAGNOSIS — N182 Chronic kidney disease, stage 2 (mild): Secondary | ICD-10-CM

## 2023-08-24 ENCOUNTER — Encounter (HOSPITAL_COMMUNITY): Payer: Self-pay | Admitting: Gastroenterology

## 2023-08-24 NOTE — Progress Notes (Signed)
 Attempted to obtain medical history for pre op call via telephone, unable to reach at this time. HIPAA compliant voicemail message left requesting return call to pre surgical testing department.

## 2023-08-31 ENCOUNTER — Ambulatory Visit (HOSPITAL_COMMUNITY): Payer: Managed Care, Other (non HMO)

## 2023-08-31 ENCOUNTER — Encounter (HOSPITAL_COMMUNITY): Payer: Self-pay | Admitting: Gastroenterology

## 2023-08-31 ENCOUNTER — Other Ambulatory Visit: Payer: Self-pay

## 2023-08-31 ENCOUNTER — Encounter (HOSPITAL_COMMUNITY)
Admission: RE | Disposition: A | Discharge status: Home / Self Care | Payer: Self-pay | Source: Home / Self Care | Attending: Gastroenterology

## 2023-08-31 ENCOUNTER — Ambulatory Visit (HOSPITAL_COMMUNITY)
Admission: RE | Admit: 2023-08-31 | Discharge: 2023-08-31 | Disposition: A | Discharge status: Home / Self Care | Payer: Managed Care, Other (non HMO) | Attending: Gastroenterology | Admitting: Gastroenterology

## 2023-08-31 DIAGNOSIS — K219 Gastro-esophageal reflux disease without esophagitis: Secondary | ICD-10-CM | POA: Diagnosis not present

## 2023-08-31 DIAGNOSIS — I129 Hypertensive chronic kidney disease with stage 1 through stage 4 chronic kidney disease, or unspecified chronic kidney disease: Secondary | ICD-10-CM | POA: Diagnosis not present

## 2023-08-31 DIAGNOSIS — D120 Benign neoplasm of cecum: Secondary | ICD-10-CM

## 2023-08-31 DIAGNOSIS — D869 Sarcoidosis, unspecified: Secondary | ICD-10-CM | POA: Insufficient documentation

## 2023-08-31 DIAGNOSIS — Z1211 Encounter for screening for malignant neoplasm of colon: Secondary | ICD-10-CM | POA: Diagnosis not present

## 2023-08-31 DIAGNOSIS — N183 Chronic kidney disease, stage 3 unspecified: Secondary | ICD-10-CM | POA: Diagnosis not present

## 2023-08-31 DIAGNOSIS — E119 Type 2 diabetes mellitus without complications: Secondary | ICD-10-CM | POA: Diagnosis not present

## 2023-08-31 DIAGNOSIS — G4733 Obstructive sleep apnea (adult) (pediatric): Secondary | ICD-10-CM | POA: Insufficient documentation

## 2023-08-31 DIAGNOSIS — Z833 Family history of diabetes mellitus: Secondary | ICD-10-CM | POA: Insufficient documentation

## 2023-08-31 DIAGNOSIS — I10 Essential (primary) hypertension: Secondary | ICD-10-CM | POA: Diagnosis not present

## 2023-08-31 DIAGNOSIS — K760 Fatty (change of) liver, not elsewhere classified: Secondary | ICD-10-CM | POA: Diagnosis not present

## 2023-08-31 DIAGNOSIS — Z800 Family history of malignant neoplasm of digestive organs: Secondary | ICD-10-CM | POA: Diagnosis not present

## 2023-08-31 DIAGNOSIS — Z7984 Long term (current) use of oral hypoglycemic drugs: Secondary | ICD-10-CM | POA: Diagnosis not present

## 2023-08-31 HISTORY — PX: COLONOSCOPY WITH PROPOFOL: SHX5780

## 2023-08-31 HISTORY — PX: POLYPECTOMY: SHX5525

## 2023-08-31 LAB — GLUCOSE, CAPILLARY: Glucose-Capillary: 159 mg/dL — ABNORMAL HIGH (ref 70–99)

## 2023-08-31 SURGERY — COLONOSCOPY WITH PROPOFOL
Anesthesia: Monitor Anesthesia Care

## 2023-08-31 MED ORDER — SODIUM CHLORIDE 0.9 % IV SOLN: INTRAVENOUS | Status: DC | PRN

## 2023-08-31 MED ORDER — PROPOFOL 500 MG/50ML IV EMUL
INTRAVENOUS | Status: DC | PRN
  Administered 2023-08-31: 100 ug/kg/min via INTRAVENOUS
  Administered 2023-08-31: 40 mg via INTRAVENOUS

## 2023-08-31 MED ORDER — PROPOFOL 500 MG/50ML IV EMUL
INTRAVENOUS | Status: AC
  Filled 2023-08-31: qty 50

## 2023-08-31 SURGICAL SUPPLY — 20 items
ELECT REM PT RETURN 9FT ADLT (ELECTROSURGICAL)
ELECTRODE REM PT RTRN 9FT ADLT (ELECTROSURGICAL) IMPLANT
FLOOR PAD 36X40 (MISCELLANEOUS) ×2
FORCEPS BIOP RAD 4 LRG CAP 4 (CUTTING FORCEPS) IMPLANT
FORCEPS BIOP RJ4 240 W/NDL (CUTTING FORCEPS)
FORCEPS BXJMBJMB 240X2.8X (CUTTING FORCEPS) IMPLANT
INJECTOR/SNARE I SNARE (MISCELLANEOUS) IMPLANT
LUBRICANT JELLY 4.5OZ STERILE (MISCELLANEOUS) IMPLANT
MANIFOLD NEPTUNE II (INSTRUMENTS) IMPLANT
NDL SCLEROTHERAPY 25GX240 (NEEDLE) IMPLANT
NEEDLE SCLEROTHERAPY 25GX240 (NEEDLE)
PAD FLOOR 36X40 (MISCELLANEOUS) ×3 IMPLANT
PROBE APC STR FIRE (PROBE) IMPLANT
PROBE INJECTION GOLD 7FR (MISCELLANEOUS) IMPLANT
SNARE ROTATE MED OVAL 20MM (MISCELLANEOUS) IMPLANT
SYR 50ML LL SCALE MARK (SYRINGE) IMPLANT
TRAP SPECIMEN MUCOUS 40CC (MISCELLANEOUS) IMPLANT
TUBING ENDO SMARTCAP PENTAX (MISCELLANEOUS) IMPLANT
TUBING IRRIGATION ENDOGATOR (MISCELLANEOUS) ×3 IMPLANT
WATER STERILE IRR 1000ML POUR (IV SOLUTION) IMPLANT

## 2023-08-31 NOTE — H&P (Signed)
 Phillip Powell. HPI: This 78 year old white male presents to the office for colorectal cancer screening. He has 3 loose BM's per day with no obvious blood or mucus in the stool. He is taking Imodium  AD to help when he has diarrhea.  He continues to use artificial sweeteners in spite of being told not to do so on multiple occasions in the past. He is taking Omeprazole occcasionally for acid reflux. He has good appetite and has lost 13 pounds over the last year since he has been following a low-fat diet with his wife who is going to the weight wellness clinic. He denies having any complaints of abdominal pain, nausea, vomiting, dysphagia or odynophagia. He denies having a family history of  celiac sprue or IBD. His maternal grandmother was diagnosed with colon cancer in her 35's His last colonoscopy done on 03/20/2022 revealed patchy loss of vascular markings in the entire colon, tubular adenomas were removed from the sigmoid, transverse and descending colon and random colonic biopsies revealed unremarkable colonic musosa with no evidence of colitis.  Past Medical History:  Diagnosis Date    Covid-19 Positive (02/06/2019)  02/11/2019   Arthritis    BPH (benign prostatic hypertrophy)    Diabetes mellitus without complication (HCC)    type 2   GERD (gastroesophageal reflux disease)    meds as needed   Hepatic steatosis    History of kidney stones    Hydroureteronephrosis    Hypertension    Hypogonadism male    Mass    left shoulder AC joint   OSA (obstructive sleep apnea)    wears CPAP wears 4 hours per nite    PONV (postoperative nausea and vomiting)    Renal calculi    Sarcoidosis    1985   Vitamin D  deficiency     Past Surgical History:  Procedure Laterality Date   APPENDECTOMY     COLONOSCOPY     CYSTOSCOPY/URETEROSCOPY/HOLMIUM LASER/STENT PLACEMENT Right 10/01/2019   Procedure: CYSTOSCOPY/RETROGRADE/URETEROSCOPY/HOLMIUM LASER/STENT PLACEMENT;  Surgeon: Devere Lonni Righter, MD;  Location: [facility];  Service: Urology;  Laterality: Right;  ONLY NEEDS 45 MIN   HAND SURGERY Bilateral 2019   Dr. Camella, tendon repair of R thumb   HERNIA REPAIR  2004   umbilical   KNEE ARTHROSCOPY Left    ORIF CLAVICULAR FRACTURE Left 03/29/2018   Procedure: LEFT SHOULDER OPEN DISTAL CLAVICLE RESECTION, CYST EXCISION;  Surgeon: Kay Kemps, MD;  Location: [facility];  Service: Orthopedics;  Laterality: Left;   ROTATOR CUFF REPAIR Left 1996   ROTATOR CUFF REPAIR Right 1997   TONSILLECTOMY  1952   TOTAL KNEE ARTHROPLASTY Left 11/22/2020   Procedure: TOTAL KNEE ARTHROPLASTY;  Surgeon: Melodi Lerner, MD;  Location: [facility];  Service: Orthopedics;  Laterality: Left;   TOTAL KNEE ARTHROPLASTY Right 07/02/2023   Procedure: TOTAL KNEE ARTHROPLASTY;  Surgeon: Melodi Lerner, MD;  Location: [facility];  Service: Orthopedics;  Laterality: Right;    Family History  Problem Relation Age of Onset   Thyroid  disease Father    Cancer Father        lung   Cancer Mother        throat   Diabetes Mother    Thyroid  disease Sister    Hypertension Son    Hyperlipidemia Son    Cancer Paternal Grandmother        colon    Social History:  reports that he has never smoked. He has never used smokeless tobacco. He reports  that he does not currently use alcohol. He reports that he does not use drugs.  Allergies:  Allergies  Allergen Reactions   Ace Inhibitors Other (See Comments)    Unknown   Ibuprofen Other (See Comments)    GI upset   Invokana [Canagliflozin] Other (See Comments)    Unknown reaction   Quinidine Other (See Comments)    Unknown    Medications: Scheduled: Continuous:  Results for orders placed or performed during the hospital encounter of 08/31/23 (from the past 24 hours)  Glucose, capillary     Status: Abnormal   Collection Time: 08/31/23  8:37 AM  Result Value Ref Range   Glucose-Capillary 159 (H) 70 - 99 mg/dL     No results found.  ROS:  As stated above in the HPI  otherwise negative.  Blood pressure (!) 165/72, pulse 67, temperature 98 F (36.7 C), temperature source Tympanic, resp. rate (!) 21, height 5' 8 (1.727 m), weight 97 kg, SpO2 98%.    PE: Gen: NAD, Alert and Oriented HEENT:  [place]/AT, EOMI Neck: Supple, no LAD Lungs: CTA Bilaterally CV: RRR without M/G/R ABD: Soft, NTND, +BS Ext: No C/C/E  Assessment/Plan: 1) Personal history of polyps - colonoscopy.  Blondina Coderre D 08/31/2023, 8:42 AM

## 2023-08-31 NOTE — Transfer of Care (Signed)
 Immediate Anesthesia Transfer of Care Note  Patient: Phillip Powell.  Procedure(s) Performed: COLONOSCOPY WITH PROPOFOL   Patient Location: PACU  Anesthesia Type:MAC  Level of Consciousness: awake and alert   Airway & Oxygen Therapy: Patient Spontanous Breathing  Post-op Assessment: Report given to RN and Post -op Vital signs reviewed and stable  Post vital signs: Reviewed and stable  Last Vitals:  Vitals Value Taken Time  BP 118/57 08/31/23 1000  Temp    Pulse 68 08/31/23 1002  Resp 19 08/31/23 1002  SpO2 95 % 08/31/23 1002  Vitals shown include unfiled device data.  Last Pain:  Vitals:   08/31/23 0825  TempSrc: Tympanic  PainSc: 0-No pain         Complications: No notable events documented.

## 2023-08-31 NOTE — Anesthesia Postprocedure Evaluation (Signed)
 Anesthesia Post Note  Patient: Phillip Powell.  Procedure(s) Performed: COLONOSCOPY WITH PROPOFOL  POLYPECTOMY     Patient location during evaluation: Endoscopy Anesthesia Type: MAC Level of consciousness: awake and alert Pain management: pain level controlled Vital Signs Assessment: post-procedure vital signs reviewed and stable Respiratory status: spontaneous breathing, nonlabored ventilation, respiratory function stable and patient connected to nasal cannula oxygen Cardiovascular status: blood pressure returned to baseline and stable Postop Assessment: no apparent nausea or vomiting Anesthetic complications: no  No notable events documented.  Last Vitals:  Vitals:   08/31/23 1015 08/31/23 1020  BP: (!) 153/66   Pulse: 70 67  Resp: 12 15  Temp:    SpO2: 100% 100%    Last Pain:  Vitals:   08/31/23 1015  TempSrc:   PainSc: 0-No pain                 Malaiah Viramontes L Ladawn Boullion

## 2023-08-31 NOTE — Discharge Instructions (Signed)
 YOU HAD AN ENDOSCOPIC PROCEDURE TODAY: Refer to the procedure report and other information in the discharge instructions given to you for any specific questions about what was found during the examination. If this information does not answer your questions, please call [facility] at [phone] to clarify.   YOU SHOULD EXPECT: Some feelings of bloating in the abdomen. Passage of more gas than usual. Walking can help get rid of the air that was put into your GI tract during the procedure and reduce the bloating. If you had a lower endoscopy (such as a colonoscopy or flexible sigmoidoscopy) you may notice spotting of blood in your stool or on the toilet paper. Some abdominal soreness may be present for a day or two, also.  DIET: Your first meal following the procedure should be a light meal and then it is ok to progress to your normal diet. A half-sandwich or bowl of soup is an example of a good first meal. Heavy or fried foods are harder to digest and may make you feel nauseous or bloated. Drink plenty of fluids but you should avoid alcoholic beverages for 24 hours. If you had an esophageal dilation, please see attached information for diet.   ACTIVITY: Your care partner should take you home directly after the procedure. You should plan to take it easy, moving slowly for the rest of the day. You can resume normal activity the day after the procedure however YOU SHOULD NOT DRIVE, use power tools, machinery or perform tasks that involve climbing or major physical exertion for 24 hours (because of the sedation medicines used during the test).   SYMPTOMS TO REPORT IMMEDIATELY: A gastroenterologist can be reached at any hour. Please call [phone]  for any of the following symptoms:  Following lower endoscopy (colonoscopy, flexible sigmoidoscopy) Excessive amounts of blood in the stool  Significant tenderness, worsening of abdominal pains  Swelling of the abdomen that is new, acute  Fever of  100 or higher  Following upper endoscopy (EGD, EUS, ERCP, esophageal dilation) Vomiting of blood or coffee ground material  New, significant abdominal pain  New, significant chest pain or pain under the shoulder blades  Painful or persistently difficult swallowing  New shortness of breath  Black, tarry-looking or red, bloody stools  FOLLOW UP:  If any biopsies were taken you will be contacted by phone or by letter within the next 1-3 weeks. Call [phone]  if you have not heard about the biopsies in 3 weeks.  Please also call with any specific questions about appointments or follow up tests. YOU HAD AN ENDOSCOPIC PROCEDURE TODAY: Refer to the procedure report and other information in the discharge instructions given to you for any specific questions about what was found during the examination. If this information does not answer your questions, please call [facility] at [phone] to clarify.   YOU SHOULD EXPECT: Some feelings of bloating in the abdomen. Passage of more gas than usual. Walking can help get rid of the air that was put into your GI tract during the procedure and reduce the bloating. If you had a lower endoscopy (such as a colonoscopy or flexible sigmoidoscopy) you may notice spotting of blood in your stool or on the toilet paper. Some abdominal soreness may be present for a day or two, also.  DIET: Your first meal following the procedure should be a light meal and then it is ok to progress to your normal diet. A half-sandwich or bowl of soup is an example  of a good first meal. Heavy or fried foods are harder to digest and may make you feel nauseous or bloated. Drink plenty of fluids but you should avoid alcoholic beverages for 24 hours. If you had an esophageal dilation, please see attached information for diet.   ACTIVITY: Your care partner should take you home directly after the procedure. You should plan to take it easy, moving slowly for the rest of the day. You  can resume normal activity the day after the procedure however YOU SHOULD NOT DRIVE, use power tools, machinery or perform tasks that involve climbing or major physical exertion for 24 hours (because of the sedation medicines used during the test).   SYMPTOMS TO REPORT IMMEDIATELY: A gastroenterologist can be reached at any hour. Please call [phone]  for any of the following symptoms:  Following lower endoscopy (colonoscopy, flexible sigmoidoscopy) Excessive amounts of blood in the stool  Significant tenderness, worsening of abdominal pains  Swelling of the abdomen that is new, acute  Fever of 100 or higher  Following upper endoscopy (EGD, EUS, ERCP, esophageal dilation) Vomiting of blood or coffee ground material  New, significant abdominal pain  New, significant chest pain or pain under the shoulder blades  Painful or persistently difficult swallowing  New shortness of breath  Black, tarry-looking or red, bloody stools  FOLLOW UP:  If any biopsies were taken you will be contacted by phone or by letter within the next 1-3 weeks. Call [phone]  if you have not heard about the biopsies in 3 weeks.  Please also call with any specific questions about appointments or follow up tests.

## 2023-08-31 NOTE — Anesthesia Preprocedure Evaluation (Addendum)
 Anesthesia Evaluation    Reviewed: Allergy & Precautions, Patient's Chart, lab work & pertinent test results  History of Anesthesia Complications (+) PONV and history of anesthetic complications  Airway Mallampati: II  TM Distance: >3 FB Neck ROM: Full    Dental no notable dental hx. (+) Teeth Intact, Dental Advisory Given   Pulmonary sleep apnea and Continuous Positive Airway Pressure Ventilation  sarcoidosis   Pulmonary exam normal breath sounds clear to auscultation       Cardiovascular hypertension, Pt. on home beta blockers and Pt. on medications Normal cardiovascular exam Rhythm:Regular Rate:Normal     Neuro/Psych negative neurological ROS  negative psych ROS   GI/Hepatic Neg liver ROS,GERD  ,,Hepatic steatosis   Endo/Other  diabetes, Type 2, Oral Hypoglycemic Agents    Renal/GU Renal InsufficiencyRenal disease  negative genitourinary   Musculoskeletal negative musculoskeletal ROS (+)    Abdominal   Peds  Hematology negative hematology ROS (+)   Anesthesia Other Findings   Reproductive/Obstetrics                             Anesthesia Physical Anesthesia Plan  ASA: 3  Anesthesia Plan: MAC   Post-op Pain Management:    Induction: Intravenous  PONV Risk Score and Plan: Propofol  infusion and Treatment may vary due to age or medical condition  Airway Management Planned: Natural Airway  Additional Equipment:   Intra-op Plan:   Post-operative Plan:   Informed Consent: I have reviewed the patients History and Physical, chart, labs and discussed the procedure including the risks, benefits and alternatives for the proposed anesthesia with the patient or authorized representative who has indicated his/her understanding and acceptance.     Dental advisory given  Plan Discussed with: CRNA  Anesthesia Plan Comments:        Anesthesia Quick Evaluation

## 2023-08-31 NOTE — Op Note (Signed)
 [facility] Patient Name: Phillip Powell Procedure Date: 08/31/2023 MRN: [id number] Attending MD: Belvie Just , MD, [id number] Date of Birth: [birth year] CSN: [id number] Age: 78 Admit Type: Outpatient Procedure:                Colonoscopy Indications:              Screening for colorectal malignant neoplasm Providers:                Belvie Just, MD, Ozell Pouch, Lorrayne Kitty,                            Technician Referring MD:              Medicines:                Propofol  per Anesthesia Complications:            No immediate complications. Estimated Blood Loss:     Estimated blood loss: none. Procedure:                Pre-Anesthesia Assessment:                           - Prior to the procedure, a History and Physical                            was performed, and patient medications and                            allergies were reviewed. The patient's tolerance of                            previous anesthesia was also reviewed. The risks                            and benefits of the procedure and the sedation                            options and risks were discussed with the patient.                            All questions were answered, and informed consent                            was obtained. Prior Anticoagulants: The patient has                            taken no anticoagulant or antiplatelet agents. ASA                            Grade Assessment: III - A patient with severe                            systemic disease. After reviewing the risks and  benefits, the patient was deemed in satisfactory                            condition to undergo the procedure.                           - Sedation was administered by an anesthesia                            professional. Deep sedation was attained.                           After obtaining informed consent, the colonoscope                            was passed under direct  vision. Throughout the                            procedure, the patient's blood pressure, pulse, and                            oxygen saturations were monitored continuously. The                            CF-HQ190L ([id number]) Olympus colonoscope was                            introduced through the anus and advanced to the the                            cecum, identified by appendiceal orifice and                            ileocecal valve. The colonoscopy was performed                            without difficulty. The patient tolerated the                            procedure well. The quality of the bowel                            preparation was evaluated using the BBPS [facility]                            Bowel Preparation Scale) with scores of: Right                            Colon = 2 (minor amount of residual staining, small                            fragments of stool and/or opaque liquid, but mucosa  seen well), Transverse Colon = 2 (minor amount of                            residual staining, small fragments of stool and/or                            opaque liquid, but mucosa seen well) and Left Colon                            = 2 (minor amount of residual staining, small                            fragments of stool and/or opaque liquid, but mucosa                            seen well). The total BBPS score equals 6. The                            quality of the bowel preparation was good. The                            ileocecal valve, appendiceal orifice, and rectum                            were photographed. Scope In: 9:38:48 AM Scope Out: 9:57:11 AM Scope Withdrawal Time: 0 hours 15 minutes 48 seconds  Total Procedure Duration: 0 hours 18 minutes 23 seconds  Findings:      A 1 mm polyp was found in the cecum. The polyp was sessile. The polyp       was removed with a cold snare. Resection and retrieval were complete. Impression:                - One 1 mm polyp in the cecum, removed with a cold                            snare. Resected and retrieved. Moderate Sedation:      Not Applicable - Patient had care per Anesthesia. Recommendation:           - Patient has a contact number available for                            emergencies. The signs and symptoms of potential                            delayed complications were discussed with the                            patient. Return to normal activities tomorrow.                            Written discharge instructions were provided to the  patient.                           - Resume previous diet.                           - Continue present medications.                           - Await pathology results. Procedure Code(s):        --- Professional ---                           [phone], Colonoscopy, flexible; with removal of                            tumor(s), polyp(s), or other lesion(s) by snare                            technique Diagnosis Code(s):        --- Professional ---                           Z12.11, Encounter for screening for malignant                            neoplasm of colon                           D12.0, Benign neoplasm of cecum CPT copyright 2022 [employer]. All rights reserved. The codes documented in this report are preliminary and upon coder review may  be revised to meet current compliance requirements. Belvie Just, MD Belvie Just, MD 08/31/2023 10:05:26 AM This report has been signed electronically. Number of Addenda: 0

## 2023-09-04 LAB — SURGICAL PATHOLOGY

## 2023-09-07 ENCOUNTER — Telehealth: Payer: Self-pay | Admitting: *Deleted

## 2023-09-07 DIAGNOSIS — I10 Essential (primary) hypertension: Secondary | ICD-10-CM

## 2023-09-07 NOTE — Telephone Encounter (Signed)
Dr. Patsy Lager is only seeing one new patient per a day that he is here. Is he ok with [place] scheduling two on one of those days to get patient seen earlier?

## 2023-09-07 NOTE — Telephone Encounter (Signed)
Copied from CRM [id number]. Topic: Clinical - Medication Refill >> Sep 07, 2023  9:48 AM [place] E wrote: Reason for CRM: Patient's wife calling in to schedule a new patient appointment for her husband (patient) as he was previously getting seen by Dr. Oneta Rack who passed away. Was able to get a new patient appointment set up with Dr. Patsy Lager on April 7th, but the patient will be running out of 4 medications before his appointment:  hydrochlorothiazide (HYDRODIURIL) 25 MG tablet - 1 week of medication left glyBURIDE (DIABETA) 5 MG tablet - 3 weeks of medication left amLODipine (NORVASC) 10 MG tablet - 3 weeks of medication left TRULICITY 0.75 MG/0.5ML SOPN - 2 weeks of medication left

## 2023-09-07 NOTE — Telephone Encounter (Signed)
I can work him in emergently as a new patient.  For this 78 year old, please schedule a 40 minute appointment, and it cannot be on a Monday.  The hospital is making contingency plans to manage these patients and their refills.  My hard rule is no more than one new patient a day.  I will make an exception and see him emergently.

## 2023-09-07 NOTE — Telephone Encounter (Signed)
lvm for pt to call office to schedule appt for New Patient

## 2023-09-07 NOTE — Telephone Encounter (Signed)
Please see if we can get him in sooner with Dr. Patsy Lager.  We are not able to refill his medication until he is established with Dr. Patsy Lager.

## 2023-09-10 ENCOUNTER — Other Ambulatory Visit: Payer: Self-pay

## 2023-09-10 DIAGNOSIS — E1122 Type 2 diabetes mellitus with diabetic chronic kidney disease: Secondary | ICD-10-CM

## 2023-09-10 DIAGNOSIS — I10 Essential (primary) hypertension: Secondary | ICD-10-CM

## 2023-09-10 MED ORDER — GLYBURIDE 5 MG PO TABS: ORAL_TABLET | ORAL | 0 refills | Status: DC

## 2023-09-10 MED ORDER — TRULICITY 0.75 MG/0.5ML ~~LOC~~ SOAJ: 0.7500 mg | SUBCUTANEOUS | 3 refills | Status: DC

## 2023-09-10 MED ORDER — HYDROCHLOROTHIAZIDE 25 MG PO TABS: 25.0000 mg | ORAL_TABLET | Freq: Every day | ORAL | 3 refills | Status: DC

## 2023-09-10 NOTE — Telephone Encounter (Signed)
 LVM for patient to c/b and schedule.

## 2023-09-27 ENCOUNTER — Encounter: Payer: Managed Care, Other (non HMO) | Admitting: Internal Medicine

## 2023-10-15 ENCOUNTER — Encounter: Payer: Managed Care, Other (non HMO) | Admitting: Internal Medicine

## 2023-10-22 ENCOUNTER — Ambulatory Visit: Payer: Managed Care, Other (non HMO) | Admitting: Neurology

## 2023-10-22 ENCOUNTER — Encounter: Payer: Managed Care, Other (non HMO) | Admitting: Internal Medicine

## 2023-10-29 ENCOUNTER — Ambulatory Visit: Payer: Managed Care, Other (non HMO) | Admitting: Neurology

## 2023-10-29 ENCOUNTER — Ambulatory Visit: Payer: Managed Care, Other (non HMO) | Admitting: Family Medicine

## 2023-10-31 ENCOUNTER — Ambulatory Visit: Payer: Managed Care, Other (non HMO) | Admitting: Family Medicine

## 2023-10-31 ENCOUNTER — Encounter: Payer: Self-pay | Admitting: Family Medicine

## 2023-10-31 VITALS — BP 126/60 | HR 62 | Temp 97.5°F | Ht 66.5 in | Wt 220.0 lb

## 2023-10-31 DIAGNOSIS — M069 Rheumatoid arthritis, unspecified: Secondary | ICD-10-CM

## 2023-10-31 DIAGNOSIS — M1120 Other chondrocalcinosis, unspecified site: Secondary | ICD-10-CM

## 2023-10-31 DIAGNOSIS — N401 Enlarged prostate with lower urinary tract symptoms: Secondary | ICD-10-CM | POA: Diagnosis not present

## 2023-10-31 DIAGNOSIS — N182 Chronic kidney disease, stage 2 (mild): Secondary | ICD-10-CM | POA: Diagnosis not present

## 2023-10-31 DIAGNOSIS — N138 Other obstructive and reflux uropathy: Secondary | ICD-10-CM

## 2023-10-31 DIAGNOSIS — E1169 Type 2 diabetes mellitus with other specified complication: Secondary | ICD-10-CM

## 2023-10-31 DIAGNOSIS — E1122 Type 2 diabetes mellitus with diabetic chronic kidney disease: Secondary | ICD-10-CM | POA: Diagnosis not present

## 2023-10-31 DIAGNOSIS — D860 Sarcoidosis of lung: Secondary | ICD-10-CM

## 2023-10-31 DIAGNOSIS — N183 Chronic kidney disease, stage 3 unspecified: Secondary | ICD-10-CM

## 2023-10-31 DIAGNOSIS — Z7985 Long-term (current) use of injectable non-insulin antidiabetic drugs: Secondary | ICD-10-CM

## 2023-10-31 DIAGNOSIS — I10 Essential (primary) hypertension: Secondary | ICD-10-CM

## 2023-10-31 DIAGNOSIS — E785 Hyperlipidemia, unspecified: Secondary | ICD-10-CM

## 2023-10-31 LAB — POCT GLYCOSYLATED HEMOGLOBIN (HGB A1C): Hemoglobin A1C: 8.1 % — AB (ref 4.0–5.6)

## 2023-10-31 MED ORDER — OZEMPIC (0.25 OR 0.5 MG/DOSE) 2 MG/3ML ~~LOC~~ SOPN: 0.2500 mg | PEN_INJECTOR | SUBCUTANEOUS | 3 refills | Status: DC

## 2023-10-31 MED ORDER — ATENOLOL 100 MG PO TABS: 100.0000 mg | ORAL_TABLET | Freq: Every day | ORAL | 3 refills | Status: AC

## 2023-10-31 MED ORDER — METFORMIN HCL ER 500 MG PO TB24: 500.0000 mg | ORAL_TABLET | Freq: Every day | ORAL | 3 refills | Status: DC

## 2023-10-31 MED ORDER — TAMSULOSIN HCL 0.4 MG PO CAPS
0.4000 mg | ORAL_CAPSULE | Freq: Every day | ORAL | 3 refills | Status: AC
Start: 2023-10-31 — End: ?

## 2023-10-31 MED ORDER — AMLODIPINE BESYLATE 10 MG PO TABS: 10.0000 mg | ORAL_TABLET | Freq: Every day | ORAL | 3 refills | Status: AC

## 2023-10-31 MED ORDER — GLYBURIDE 5 MG PO TABS: 5.0000 mg | ORAL_TABLET | Freq: Two times a day (BID) | ORAL | 0 refills | Status: DC

## 2023-10-31 MED ORDER — FAMOTIDINE 40 MG PO TABS: 40.0000 mg | ORAL_TABLET | Freq: Every day | ORAL | 1 refills | Status: AC | PRN

## 2023-10-31 MED ORDER — HYDROCHLOROTHIAZIDE 25 MG PO TABS: 25.0000 mg | ORAL_TABLET | Freq: Every day | ORAL | 3 refills | Status: AC

## 2023-10-31 NOTE — Progress Notes (Unsigned)
 Keerstin Bjelland T. Darianny Momon, MD, CAQ Sports Medicine [facility] at [facility] [address] [place], [address]  Phone: [phone]  FAX: [phone]  Phillip Powell. - 78 y.o. male  MRN [id number]  Date of Birth: Dec 23, 1945  Date: 10/31/2023  PCP: Hannah Beat, MD  Referral: No ref. provider found  Chief Complaint  Patient presents with   Establish Care   Subjective:   Phillip Powell. is a 78 y.o. very pleasant male patient with Body mass index is 34.98 kg/m. who presents with the following:  He is a very pleasant gentleman who has multiple medical problems who is a new patient to me and a former patient of Dr. Oneta Rack, who recently passed away.  He does have type 2 diabetes. He is currently on Trulicity 0.75 mg.  He also is on glyburide 5 mg and metformin [phone] mg a day. There is some discrepancy with what the dosing is in the chart compared to what the patient is doing.  He is only taking metformin 500 mg a day. He has stopped his Trulicity a few weeks ago, was come concerns as this was causing diarrhea He actually is not taking glyburide, but he was instructed to take glyburide if his blood sugar was greater than 150  He also has hypertension, he currently is taking Atenolol 100 mg a day Hydrochlorothiazide 25 mg a day Amlodipine 10 mg a day -Chart does note an unknown reaction to ACE inhibitors  [facility] feels like he likely has rheumatoid arthritis per his report.  I do not have any rheumatological records. - Rheumatology - [facility]  Chronic kidney disease stage III -last GFR was actually in the 80s  Lab Results  Component Value Date   NA 136 07/03/2023   K 3.7 07/03/2023   CO2 25 07/03/2023   GLUCOSE 260 (H) 07/03/2023   BUN 22 07/03/2023   CREATININE 1.12 07/03/2023   CALCIUM 8.6 (L) 07/03/2023   EGFR 88 03/06/2023   GFRNONAA >60 07/03/2023    Hyperlipidemia, no medication -he does have type 2  diabetes  Review of Systems is noted in the HPI, as appropriate  Objective:   BP 126/60 (BP Location: Left Arm, Patient Position: Sitting, Cuff Size: Large)   Pulse 62   Temp (!) 97.5 F (36.4 C) (Temporal)   Ht 5' 6.5" (1.689 m)   Wt 220 lb (99.8 kg)   SpO2 94%   BMI 34.98 kg/m   GEN: No acute distress; alert,appropriate. CV: RRR, no m/g/r  PULM: Normal respiratory rate, no accessory muscle use. No wheezes, crackles or rhonchi  PSYCH: Normally interactive.   Laboratory and Imaging Data:  Assessment and Plan:     ICD-10-CM   1. Type 2 diabetes mellitus with stage 2 chronic kidney disease, without long-term current use of insulin (HCC)  E11.22 POCT glycosylated hemoglobin (Hb A1C)   N18.2 metFORMIN (GLUCOPHAGE-XR) 500 MG 24 hr tablet    glyBURIDE (DIABETA) 5 MG tablet    2. BPH with obstruction/lower urinary tract symptoms  N40.1 tamsulosin (FLOMAX) 0.4 MG CAPS capsule   N13.8     3. 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 CKD stage 3 due to type 2 diabetes mellitus (HCC)  E11.22    N18.30     6. Hyperlipidemia associated with type 2 diabetes mellitus (  HCC)  E11.69    E78.5      He has some unusual medical management of some of his conditions.    Will need to have him do a scheduled pattern of diabetes management with consistent dosing.  He is worried that metformin would cause him diarrhea add an additional dose.  Will keep him in metformin 500 mg for now.  He was using glyburide as needed, and I am going to start scheduling this once twice a day.  There is room to increase his.  He believes the Trulicity was causing him some diarrhea.  He is going to need more diabetic medication, so we are going to start Ozempic.  He is going to wait to start this until he has a week off from work next month.  Hyperlipidemia in the setting of type 2 diabetes.  We  will need to place him on a statin if he is open to this.  For now, I think there is so much going on at this initial appointment that I am going to hold off on some of this management until I see him again in a few months.  Hypertension.  He really should ideally be on an ACE inhibitor or ARB.  He has an unknown reaction noted with regards to ACE inhibitor.  If he has not had angioedema, we likely can revisit this at least with an ARB.  He has no history of cerebrovascular or cardiovascular disease.    Pulmonary sarcoid, he reports no history of this in 30 years.  History of rheumatoid arthritis.  He is on [place].  Rheumatological notes reviewed.  Medication Management during today's office visit: Meds ordered this encounter  Medications   metFORMIN (GLUCOPHAGE-XR) 500 MG 24 hr tablet    Sig: Take 1 tablet (500 mg total) by mouth daily with breakfast.    Dispense:  90 tablet    Refill:  3   glyBURIDE (DIABETA) 5 MG tablet    Sig: Take 1 tablet (5 mg total) by mouth 2 (two) times daily with a meal.    Dispense:  270 tablet    Refill:  0   tamsulosin (FLOMAX) 0.4 MG CAPS capsule    Sig: Take 1 capsule (0.4 mg total) by mouth daily.    Dispense:  90 capsule    Refill:  3   atenolol (TENORMIN) 100 MG tablet    Sig: Take 1 tablet (100 mg total) by mouth daily.    Dispense:  90 tablet    Refill:  3   hydrochlorothiazide (HYDRODIURIL) 25 MG tablet    Sig: Take 1 tablet (25 mg total) by mouth daily.    Dispense:  90 tablet    Refill:  3   amLODipine (NORVASC) 10 MG tablet    Sig: Take 1 tablet (10 mg total) by mouth daily.    Dispense:  90 tablet    Refill:  3   famotidine (PEPCID) 40 MG tablet    Sig: Take 1 tablet (40 mg total) by mouth daily as needed for heartburn or indigestion.    Dispense:  90 tablet    Refill:  1   Semaglutide,0.25 or 0.5MG /DOS, (OZEMPIC, 0.25 OR 0.5 MG/DOSE,) 2 MG/3ML SOPN    Sig: Inject 0.25 mg into the skin once a week.    Dispense:  3 mL    Refill:  3    Medications Discontinued During This Encounter  Medication Reason   amLODipine (NORVASC) 10 MG tablet Dose change  oxyCODONE (OXY IR/ROXICODONE) 5 MG immediate release tablet Completed Course   B-D 3CC LUER-LOK SYR 21GX1" 21G X 1" 3 ML MISC    famotidine (PEPCID) 40 MG tablet Duplicate   Dulaglutide (TRULICITY) 0.75 MG/0.5ML SOAJ    methocarbamol (ROBAXIN) 500 MG tablet    traMADol (ULTRAM) 50 MG tablet    atenolol (TENORMIN) 100 MG tablet Reorder   tamsulosin (FLOMAX) 0.4 MG CAPS capsule Reorder   metFORMIN (GLUCOPHAGE-XR) 500 MG 24 hr tablet    hydrochlorothiazide (HYDRODIURIL) 25 MG tablet Reorder   glyBURIDE (DIABETA) 5 MG tablet    amLODipine (NORVASC) 10 MG tablet Reorder   famotidine (PEPCID) 40 MG tablet Reorder    Orders placed today for conditions managed today: Orders Placed This Encounter  Procedures   POCT glycosylated hemoglobin (Hb A1C)    Disposition: Return in about 3 months (around 01/30/2024) for diabetes.  Dragon [employer] One speech-to-text software was used for transcription in this dictation.  Possible transcriptional errors can occur using Animal nutritionist.   Signed,  Elpidio Galea. Jaunita Mikels, MD   Outpatient Encounter Medications as of 10/31/2023  Medication Sig   acetaminophen (TYLENOL) 500 MG tablet Take 1,000 mg by mouth every 6 (six) hours as needed for moderate pain.   blood glucose meter kit and supplies KIT Dispense based on  insurance coverage. Test blood sugar twice daily. #100 lancet and strips with 3 refills   Blood Glucose Monitoring Suppl (ONETOUCH VERIO) w/Device KIT Test blood sugar once daily.   Blood Glucose Monitoring Suppl DEVI Use meter three times a day to check blood sugar   glucose blood (ONETOUCH VERIO) test strip Test sugars 3 x a day for hypoglycemia/hyperglycemia and fluctuating sugars   Lancets (ONETOUCH ULTRASOFT) lancets Test blood sugar once daily   leflunomide (ARAVA) 10 MG tablet Take 10 mg by mouth daily.   loperamide  (IMODIUM A-D) 2 MG tablet Take 2-4 mg by mouth 4 (four) times daily as needed for diarrhea or loose stools.   meclizine (ANTIVERT) 25 MG tablet Take 25 mg by mouth 3 (three) times daily as needed for dizziness.   ondansetron (ZOFRAN) 4 MG tablet Take 1 tablet (4 mg total) by mouth every 6 (six) hours as needed for nausea.   Semaglutide,0.25 or 0.5MG /DOS, (OZEMPIC, 0.25 OR 0.5 MG/DOSE,) 2 MG/3ML SOPN Inject 0.25 mg into the skin once a week.   [DISCONTINUED] amLODipine (NORVASC) 10 MG tablet TAKE 1/2 TO 1 TABLET BY MOUTH DAILY FOR BLOOD PRESSURE   [DISCONTINUED] amLODipine (NORVASC) 10 MG tablet Take 10 mg by mouth daily.   [DISCONTINUED] atenolol (TENORMIN) 100 MG tablet Take  1 tablet  Daily for BP                                                                                                          /  TAKE                                                                  BY                                                   MOUTH   [DISCONTINUED] famotidine (PEPCID) 40 MG tablet Take 40 mg by mouth daily as needed for heartburn or indigestion.   [DISCONTINUED] glyBURIDE (DIABETA) 5 MG tablet Take 1/2 to 1 tablet  3 x /day  with Meals for Diabetes   [DISCONTINUED] hydrochlorothiazide (HYDRODIURIL) 25 MG tablet Take 1 tablet (25 mg total) by mouth daily.   [DISCONTINUED] metFORMIN (GLUCOPHAGE-XR) 500 MG 24 hr tablet TAKE 1 TO 2 TABLETS BY MOUTH TWICE DAILY WITH MEALS FOR DIABETES   [DISCONTINUED] methocarbamol (ROBAXIN) 500 MG tablet Take 1 tablet (500 mg total) by mouth every 6 (six) hours as needed for muscle spasms.   [DISCONTINUED] tamsulosin (FLOMAX) 0.4 MG CAPS capsule TAKE 1 CAPSULE BY MOUTH AT BEDTIME FOR PROSTATE   [DISCONTINUED] traMADol (ULTRAM) 50 MG tablet Take 1-2 tablets (50-100 mg total) by mouth every 6 (six) hours as needed for moderate pain (pain score 4-6).   amLODipine (NORVASC) 10 MG tablet Take 1  tablet (10 mg total) by mouth daily.   atenolol (TENORMIN) 100 MG tablet Take 1 tablet (100 mg total) by mouth daily.   famotidine (PEPCID) 40 MG tablet Take 1 tablet (40 mg total) by mouth daily as needed for heartburn or indigestion.   glyBURIDE (DIABETA) 5 MG tablet Take 1 tablet (5 mg total) by mouth 2 (two) times daily with a meal.   hydrochlorothiazide (HYDRODIURIL) 25 MG tablet Take 1 tablet (25 mg total) by mouth daily.   metFORMIN (GLUCOPHAGE-XR) 500 MG 24 hr tablet Take 1 tablet (500 mg total) by mouth daily with breakfast.   tamsulosin (FLOMAX) 0.4 MG CAPS capsule Take 1 capsule (0.4 mg total) by mouth daily.   [DISCONTINUED] B-D 3CC LUER-LOK SYR 21GX1" 21G X 1" 3 ML MISC use as directed   [DISCONTINUED] Dulaglutide (TRULICITY) 0.75 MG/0.5ML SOAJ 0.75 mg by Subdermal route once a week. (Patient not taking: Reported on 10/31/2023)   [DISCONTINUED] famotidine (PEPCID) 40 MG tablet TAKE 1 TABLET BY MOUTH AT BEDTIME FOR INDIGESTION OR ACID REFLUX (Patient taking differently: Take 40 mg by mouth daily as needed for heartburn.)   [DISCONTINUED] oxyCODONE (OXY IR/ROXICODONE) 5 MG immediate release tablet Take 1-2 tablets (5-10 mg total) by mouth every 6 (six) hours as needed for severe pain (pain score 7-10).   No facility-administered encounter medications on file as of 10/31/2023.

## 2023-11-01 ENCOUNTER — Encounter: Payer: Self-pay | Admitting: Family Medicine

## 2023-11-01 DIAGNOSIS — D860 Sarcoidosis of lung: Secondary | ICD-10-CM | POA: Insufficient documentation

## 2023-11-01 DIAGNOSIS — M069 Rheumatoid arthritis, unspecified: Secondary | ICD-10-CM | POA: Insufficient documentation

## 2023-11-01 DIAGNOSIS — M1120 Other chondrocalcinosis, unspecified site: Secondary | ICD-10-CM | POA: Insufficient documentation

## 2023-11-12 ENCOUNTER — Encounter: Payer: Self-pay | Admitting: Neurology

## 2023-11-12 ENCOUNTER — Ambulatory Visit (INDEPENDENT_AMBULATORY_CARE_PROVIDER_SITE_OTHER): Payer: Managed Care, Other (non HMO) | Admitting: Neurology

## 2023-11-12 VITALS — BP 149/70 | HR 64 | Ht 66.0 in | Wt 226.0 lb

## 2023-11-12 DIAGNOSIS — G4733 Obstructive sleep apnea (adult) (pediatric): Secondary | ICD-10-CM

## 2023-11-12 DIAGNOSIS — N183 Chronic kidney disease, stage 3 unspecified: Secondary | ICD-10-CM

## 2023-11-12 DIAGNOSIS — E1122 Type 2 diabetes mellitus with diabetic chronic kidney disease: Secondary | ICD-10-CM | POA: Diagnosis not present

## 2023-11-12 DIAGNOSIS — Z7984 Long term (current) use of oral hypoglycemic drugs: Secondary | ICD-10-CM

## 2023-11-12 DIAGNOSIS — E559 Vitamin D deficiency, unspecified: Secondary | ICD-10-CM

## 2023-11-12 DIAGNOSIS — G4719 Other hypersomnia: Secondary | ICD-10-CM

## 2023-11-12 DIAGNOSIS — N182 Chronic kidney disease, stage 2 (mild): Secondary | ICD-10-CM

## 2023-11-12 MED ORDER — MODAFINIL 200 MG PO TABS: 200.0000 mg | ORAL_TABLET | Freq: Every day | ORAL | 5 refills | Status: AC

## 2023-11-12 NOTE — Patient Instructions (Signed)
 Your sleepiness has decreased but I feel we can further improve your alertness by using modafinil .   Modafinil  Tablets What is this medication? MODAFINIL  (moe DAF i nil) treats sleep disorders, such as narcolepsy, obstructive sleep apnea, and shift work disorder. It works by promoting wakefulness. It belongs to a group of medications called stimulants. This medicine may be used for other purposes; ask your health care provider or pharmacist if you have questions. COMMON BRAND NAME(S): Provigil  What should I tell my care team before I take this medication? They need to know if you have any of these conditions: Kidney disease Liver disease Mental health conditions An unusual or allergic reaction to modafinil , other medications, foods, dyes, or preservatives Pregnant or trying to get pregnant Breast-feeding How should I use this medication? Take this medication by mouth with water . Take it as directed on the prescription label at the same time every day. You can take it with or without food. If it upsets your stomach, take it with food. Keep taking it unless your care team tells you to stop. A special MedGuide will be given to you by the pharmacist with each prescription and refill. Be sure to read this information carefully each time. Talk to your care team about the use of this medication in children. Special care may be needed. Overdosage: If you think you have taken too much of this medicine contact a poison control center or emergency room at once. NOTE: This medicine is only for you. Do not share this medicine with others. What if I miss a dose? If you miss a dose, take it as soon as you can. If it is almost time for your next dose, take only that dose. Do not take double or extra doses. What may interact with this medication? Do not take this medication with any of the following: Amphetamine or dextroamphetamine Dexmethylphenidate or methylphenidate MAOIs, such as Marplan, Nardil, and  Parnate Pemoline Procarbazine This medication may also interact with the following: Antifungal medications, such as itraconazole or ketoconazole Barbiturates, such as phenobarbital Carbamazepine Cyclosporine Diazepam  Estrogen or progestin hormones Medications for mental health conditions Phenytoin Propranolol Triazolam Warfarin This list may not describe all possible interactions. Give your health care provider a list of all the medicines, herbs, non-prescription drugs, or dietary supplements you use. Also tell them if you smoke, drink alcohol, or use illegal drugs. Some items may interact with your medicine. What should I watch for while using this medication? Visit your care team for regular checks on your progress. It may be some time before you see the benefit from this medication. This medication may affect your coordination, reaction time, or judgment. Do not drive or operate machinery until you know how this medication affects you. Sit up or stand slowly to reduce the risk of dizzy or fainting spells. Drinking alcohol with this medication can increase the risk of these side effects. This medication may cause serious skin reactions. They can happen weeks to months after starting the medication. Contact your care team right away if you notice fevers or flu-like symptoms with a rash. The rash may be red or purple and then turn into blisters or peeling of the skin. You may also notice a red rash with swelling of the face, lips, or lymph nodes in your neck or under your arms. Estrogen and progestin hormones may not work as well while you are taking this medication. Your care team can help you find the contraceptive option that works for you. It  is unknown if the effects of this medication will be increased by the use of caffeine. Caffeine is found in many foods, beverages, and medications. Ask your care team if you should limit or change your intake of caffeine-containing products while on  this medication. What side effects may I notice from receiving this medication? Side effects that you should report to your care team as soon as possible: Allergic reactions or angioedema--skin rash, itching or hives, swelling of the face, eyes, lips, tongue, arms, or legs, trouble swallowing or breathing Increase in blood pressure Mood and behavior changes--anxiety, nervousness, confusion, hallucinations, irritability, hostility, thoughts of suicide or self-harm, worsening mood, feelings of depression Rash, fever, and swollen lymph nodes Redness, blistering, peeling, or loosening of the skin, including inside the mouth Side effects that usually do not require medical attention (report to your care team if they continue or are bothersome): Anxiety, nervousness Dizziness Headache Nausea Trouble sleeping This list may not describe all possible side effects. Call your doctor for medical advice about side effects. You may report side effects to FDA at 1-800-FDA-1088. Where should I keep my medication? Keep out of the reach of children and pets. This medication can be abused. Keep it in a safe place to protect it from theft. Do not share it with anyone. It is only for you. Selling or giving away this medication is dangerous and against the law. Store at room temperature between 20 and 25 degrees C (68 and 77 degrees F). Get rid of any unused medication after the expiration date. This medication may cause harm and death if it is taken by other adults, children, or pets. It is important to get rid of the medication as soon as you no longer need it or it is expired. You can do this in two ways: Take the medication to a medication take-back program. Check with your pharmacy or law enforcement to find a location. If you cannot return the medication, check the label or package insert to see if the medication should be thrown out in the garbage or flushed down the toilet. If you are not sure, ask your care  team. If it is safe to put it in the trash, take the medication out of the container. Mix the medication with cat litter, dirt, coffee grounds, or other unwanted substance. Seal the mixture in a bag or container. Put it in the trash. NOTE: This sheet is a summary. It may not cover all possible information. If you have questions about this medicine, talk to your doctor, pharmacist, or health care provider.  2024 Elsevier/Gold Standard (2022-08-18 00:00:00)

## 2023-11-12 NOTE — Progress Notes (Signed)
 Provider:  Neomia Banner, MD  Primary Care Physician:  Scherrie Curt, MD [address] [place] [address]     Referring Provider: Vangie Genet, Md [address] [place],  [place] [address]   Now Dr.Copland, MD          Chief Complaint according to patient   Patient presents with:                HISTORY OF PRESENT ILLNESS:  Phillip Powell. is a 78 y.o. male patient who is here for revisit 11/12/2023 for  following up on high rsidual AHis on CPP. The last visit with me had already shown a high residual AHI.   Phillip Powell  Chief concern according to patient :  I am not seeing much improvement.    Phillip Powell is a 78 y.o. male patient who is here for revisit 04/23/2023 for  CPAP follow up, new machine issued following an in lab titration PSG  01-14-2023.   Chief concern according to patient :  : "works just as well as the old one".   Mr. Chaviano's obstructive sleep apnea was still severe with a baseline AHI of 55.5 so his study became a split-night polysomnography after 2 hours.  He was titrated to CPAP beginning at 5 and ending at a final pressure of 16 cm water  was 2 cm EPR.  Severe sleep apnea was controlled under a pressure of 13 cm as well as under the highest explored pressure at 16 cm the residual AHI during his lab titration was 5.8.  He is using a large AirFit F10 full facemask produced by [employer]. His wife feels he is breathing loudly, sometimes snoring.  His residual AHI may improve with a higher EPR ?  He has been 100% compliant by days and 90% compliant by hours with an average of 6 hours 39 minutes.  The 95th percentile air leak is 8.5 L which is low and for a full facemask this is a really low residual air leak. So we decided today to bring up his expiratory pressure relief to 3 cm water  the maximum pressure to 20 cm water  the minimum pressure to 6 cm water .  I would be happy with an AHI under 10 I would be elated if he would  find an AHI under 5.  The residual apneas as scored here are still obstructive in origin- not CENTRAL.   The patient's Epworth sleepiness scale was today endorsed at 9 out of 24 points fatigue severity 39 out of 63 points in the so-called geriatric depression score 3 out of 15 points.   Social HX : truck driver      Review of Systems: Out of a complete 14 system review, the patient complains of only the following symptoms, and all other reviewed systems are negative.:   How likely are you to doze in the following situations: 0 = not likely, 1 = slight chance, 2 = moderate chance, 3 = high chance  Sitting and Reading? Watching Television? Sitting inactive in a public place (theater or meeting)? Lying down in the afternoon when circumstances permit? Sitting and talking to someone? Sitting quietly after lunch without alcohol? In a car, while stopped for a few minutes in traffic? As a passenger in a car for an hour without a break?  Total = 9/ 24 , FSS at 30 , sleeping 6- hours sometimes 7. Nocturia.  Shift worker.  Social History   Socioeconomic History   Marital status: Married    Spouse name: Not on file   Number of children: Not on file   Years of education: Not on file   Highest education level: Not on file  Occupational History   Not on file  Tobacco Use   Smoking status: Never   Smokeless tobacco: Never  Vaping Use   Vaping status: Never Used  Substance and Sexual Activity   Alcohol use: Not Currently    Comment: very rarely   Drug use: Never   Sexual activity: Not Currently  Other Topics Concern   Not on file  Social History Narrative   Not on file   Social Drivers of Health   Financial Resource Strain: Not on file  Food Insecurity: No Food Insecurity (07/02/2023)   Hunger Vital Sign    Worried About Running Out of Food in the Last Year: Never true    Ran Out of Food in the Last Year: Never true  Transportation Needs: No Transportation Needs (07/02/2023)    PRAPARE - Administrator, Civil Service (Medical): No    Lack of Transportation (Non-Medical): No  Physical Activity: Not on file  Stress: Not on file  Social Connections: Not on file    Family History  Problem Relation Age of Onset   Thyroid  disease Father    Cancer Father        lung   Cancer Mother        throat   Diabetes Mother    Thyroid  disease Sister    Hypertension Son    Hyperlipidemia Son    Cancer Paternal Grandmother        colon    Past Medical History:  Diagnosis Date   BPH (benign prostatic hypertrophy)    GERD (gastroesophageal reflux disease)    meds as needed   Hepatic steatosis    History of kidney stones    Hypertension    Hypogonadism male    OSA (obstructive sleep apnea)    wears CPAP wears 4 hours per nite    Renal calculi    Sarcoidosis    1985   Type 2 Diabetes    type 2   Vitamin D  deficiency     Past Surgical History:  Procedure Laterality Date   APPENDECTOMY     COLONOSCOPY WITH PROPOFOL  N/A 08/31/2023   Procedure: COLONOSCOPY WITH PROPOFOL ;  Surgeon: Alvis Jourdain, MD;  Location: [facility];  Service: Gastroenterology;  Laterality: N/A;   CYSTOSCOPY/URETEROSCOPY/HOLMIUM LASER/STENT PLACEMENT Right 10/01/2019   Procedure: CYSTOSCOPY/RETROGRADE/URETEROSCOPY/HOLMIUM LASER/STENT PLACEMENT;  Surgeon: Adelbert Homans, MD;  Location: [facility];  Service: Urology;  Laterality: Right;  ONLY NEEDS 45 MIN   HAND SURGERY Bilateral 2019   Dr. Aloha Arnold, tendon repair of R thumb   HERNIA REPAIR  2004   umbilical   KNEE ARTHROSCOPY Left    ORIF CLAVICULAR FRACTURE Left 03/29/2018   Procedure: LEFT SHOULDER OPEN DISTAL CLAVICLE RESECTION, CYST EXCISION;  Surgeon: Winston Hawking, MD;  [facility];  Service: Orthopedics;  Laterality: Left;   POLYPECTOMY  08/31/2023   Procedure: POLYPECTOMY;  Surgeon: Alvis Jourdain, MD;  Location: [facility];  Service: Gastroenterology;;   ROTATOR CUFF REPAIR Left 1996   ROTATOR CUFF  REPAIR Right 1997   TONSILLECTOMY  1952   TOTAL KNEE ARTHROPLASTY Left 11/22/2020   Procedure: TOTAL KNEE ARTHROPLASTY;  Surgeon: Liliane Rei, MD;  Location: [facility];  Service: Orthopedics;  Laterality: Left;   TOTAL  KNEE ARTHROPLASTY Right 07/02/2023   Procedure: TOTAL KNEE ARTHROPLASTY;  Surgeon: Liliane Rei, MD;  Location: [facility];  Service: Orthopedics;  Laterality: Right;     Current Outpatient Medications on File Prior to Visit  Medication Sig Dispense Refill   acetaminophen  (TYLENOL ) 500 MG tablet Take 1,000 mg by mouth every 6 (six) hours as needed for moderate pain.     amLODipine  (NORVASC ) 10 MG tablet Take 1 tablet (10 mg total) by mouth daily. 90 tablet 3   atenolol  (TENORMIN ) 100 MG tablet Take 1 tablet (100 mg total) by mouth daily. 90 tablet 3   blood glucose meter kit and supplies KIT Dispense based on  insurance coverage. Test blood sugar twice daily. #100 lancet and strips with 3 refills 1 each 0   Blood Glucose Monitoring Suppl (ONETOUCH VERIO) w/Device KIT Test blood sugar once daily. 1 kit 0   Blood Glucose Monitoring Suppl DEVI Use meter three times a day to check blood sugar 1 each 0   famotidine  (PEPCID ) 40 MG tablet Take 1 tablet (40 mg total) by mouth daily as needed for heartburn or indigestion. 90 tablet 1   glucose blood (ONETOUCH VERIO) test strip Test sugars 3 x a day for hypoglycemia/hyperglycemia and fluctuating sugars 100 each 12   glyBURIDE  (DIABETA ) 5 MG tablet Take 1 tablet (5 mg total) by mouth 2 (two) times daily with a meal. 270 tablet 0   hydrochlorothiazide  (HYDRODIURIL ) 25 MG tablet Take 1 tablet (25 mg total) by mouth daily. 90 tablet 3   Lancets (ONETOUCH ULTRASOFT) lancets Test blood sugar once daily 100 each 12   leflunomide  (ARAVA ) 10 MG tablet Take 10 mg by mouth daily.     loperamide  (IMODIUM  A-D) 2 MG tablet Take 2-4 mg by mouth 4 (four) times daily as needed for diarrhea or loose stools.     meclizine  (ANTIVERT ) 25 MG tablet Take 25 mg by  mouth 3 (three) times daily as needed for dizziness.     metFORMIN  (GLUCOPHAGE -XR) 500 MG 24 hr tablet Take 1 tablet (500 mg total) by mouth daily with breakfast. 90 tablet 3   ondansetron  (ZOFRAN ) 4 MG tablet Take 1 tablet (4 mg total) by mouth every 6 (six) hours as needed for nausea. 20 tablet 0   Semaglutide ,0.25 or 0.5MG /DOS, (OZEMPIC , 0.25 OR 0.5 MG/DOSE,) 2 MG/3ML SOPN Inject 0.25 mg into the skin once a week. 3 mL 3   tamsulosin  (FLOMAX ) 0.4 MG CAPS capsule Take 1 capsule (0.4 mg total) by mouth daily. 90 capsule 3   No current facility-administered medications on file prior to visit.    Allergies  Allergen Reactions   Ace Inhibitors Other (See Comments)    Unknown   Ibuprofen Other (See Comments)    GI upset   Invokana [Canagliflozin] Other (See Comments)    Unknown reaction   Quinidine Other (See Comments)    Unknown     DIAGNOSTIC DATA (LABS, IMAGING, TESTING) - I reviewed patient records, labs, notes, testing and imaging myself where available.  Lab Results  Component Value Date   WBC 12.5 (H) 07/03/2023   HGB 12.6 (L) 07/03/2023   HCT 38.4 (L) 07/03/2023   MCV 81.7 07/03/2023   PLT 199 07/03/2023      Component Value Date/Time   NA 136 07/03/2023 0320   K 3.7 07/03/2023 0320   CL 99 07/03/2023 0320   CO2 25 07/03/2023 0320   GLUCOSE 260 (H) 07/03/2023 0320   BUN 22 07/03/2023 0320  CREATININE 1.12 07/03/2023 0320   CREATININE 0.91 03/06/2023 1114   CALCIUM 8.6 (L) 07/03/2023 0320   PROT 6.7 03/06/2023 1114   ALBUMIN  3.3 (L) 11/22/2020 1020   AST 12 03/06/2023 1114   ALT 13 03/06/2023 1114   ALKPHOS 58 11/22/2020 1020   BILITOT 0.4 03/06/2023 1114   GFRNONAA >60 07/03/2023 0320   GFRNONAA 75 10/18/2020 0914   GFRAA 87 10/18/2020 0914   Lab Results  Component Value Date   CHOL 182 03/06/2023   HDL 45 03/06/2023   LDLCALC 98 03/06/2023   TRIG 293 (H) 03/06/2023   CHOLHDL 4.0 03/06/2023   Lab Results  Component Value Date   HGBA1C 8.1 (A)  10/31/2023   Lab Results  Component Value Date   VITAMINB12 357 02/23/2020   Lab Results  Component Value Date   TSH 1.38 03/06/2023    PHYSICAL EXAM:  Today's Vitals   11/12/23 1032  BP: (!) 149/70  Pulse: 64  Weight: 226 lb (102.5 kg)  Height: 5\' 6"  (1.676 m)   Body mass index is 36.48 kg/m.   Wt Readings from Last 3 Encounters:  11/12/23 226 lb (102.5 kg)  10/31/23 220 lb (99.8 kg)  08/31/23 213 lb 13.5 oz (97 kg)     Ht Readings from Last 3 Encounters:  11/12/23 5\' 6"  (1.676 m)  10/31/23 5' 6.5" (1.689 m)  08/31/23 5\' 8"  (1.727 m)      General: The patient is awake, alert and appears not in acute distress.The patient is awake, alert and appears not in acute distress. The patient is well groomed. Head: Normocephalic, atraumatic. Neck is supple.  Mallampati  3 plus ,  neck circumference:17. 75 inches .  Nasal airflow  patent.  Retrognathia is not seen.  Dental status:  biology Cardiovascular:  Regular rate and cardiac rhythm by pulse,  without distended neck veins. Respiratory: Lungs are clear to auscultation.  Skin:  Without evidence of ankle edema, or rash.facial redness.  Trunk: The patient's posture is slightly stooped.  NEUROLOGIC EXAM: The patient is awake and alert, oriented to place and time.   Memory subjective described as intact.  Attention span & concentration ability appears normal.  Speech is fluent,  without  dysarthria, dysphonia or aphasia.  Mood and affect are sceptic, more reserved,  not friendly.   Cranial nerves: no loss of smell or taste reported  Pupils are equal and briskly reactive to light. Funduscopic exam deferred. .  Extraocular movements in vertical and horizontal planes were intact and without nystagmus. No Diplopia. Visual fields by finger perimetry are intact. Hearing was intact to soft voice and finger rubbing.    Facial sensation intact to fine touch.  Facial motor strength is symmetric and tongue and uvula move midline.   Neck ROM : rotation, tilt and flexion extension were normal for age and shoulder shrug was symmetrical.    Motor exam:  Symmetric bulk, tone and ROM.   Normal tone without cog wheeling, symmetric grip strength    ASSESSMENT AND PLAN 78 y.o. year old male  here with:    1)  high residual AHI on CPAP under Full Face mask, highly compliant user with moderate air leak. All residual is obstructive.  Lets increaset to 20 CM water .   See attache report_  AHI 10.6/ h.   I will reset this machine to 12-20 cm water .   2) he sleeps in daytime , works at night and he is 78 years-old.   He is also  sleepy due to DM - poor controlled.    3) he recovered well from TKR ,  right knee.  Dr Rossie Coon, Dec 9 th 2024.    Follow up through our NP within 12 months.   I would like to thank Copland, Jolena Nay, MD  for allowing me to meet with and to take care of this pleasant patient.     After spending a total time of  21  minutes face to face and additional time for physical and neurologic examination, review of laboratory studies,  personal review of imaging studies, reports and results of other testing and review of referral information / records as far as provided in visit,   Electronically signed by: Neomia Banner, MD 11/12/2023 11:16 AM  [facility] Board certified by The [employer] of Sleep Medicine and Diplomate of the [employer] of Sleep Medicine. Board certified In Neurology through the ABPN, Fellow of the [employer] of Neurology.

## 2023-11-13 ENCOUNTER — Other Ambulatory Visit (HOSPITAL_COMMUNITY): Payer: Self-pay

## 2023-11-16 ENCOUNTER — Other Ambulatory Visit (HOSPITAL_COMMUNITY): Payer: Self-pay

## 2023-11-16 ENCOUNTER — Telehealth: Payer: Self-pay

## 2023-11-16 NOTE — Telephone Encounter (Signed)
 Pharmacy Patient Advocate Encounter   Received notification from CoverMyMeds that prior authorization for Modafinil  200MG  tablets is required/requested.   Insurance verification completed.   The patient is insured through [employer] .   Per test claim: PA required; PA submitted to above mentioned insurance via CoverMyMeds Key/confirmation #/EOC [id number] Status is pending

## 2023-11-19 NOTE — Telephone Encounter (Signed)
 Pharmacy Patient Advocate Encounter  Received notification from [employer] that Prior Authorization for Modafinil  200MG  tablets has been APPROVED from 10/17/2023 to [id number]   PA #/Case ID/Reference #: PA Case ID #: [id number]

## 2024-02-03 NOTE — Progress Notes (Unsigned)
 Phillip Powell T. Darbie Biancardi, MD, CAQ Sports Medicine [facility] at [facility] [address] [name], [address]  Phone: [phone]  FAX: [phone]  Lamichael Powell. - 78 y.o. male  MRN [id number]  Date of Birth: [birth year]  Date: 02/04/2024  PCP: Phillip Mirza, MD  Referral: Phillip Mirza, MD  No chief complaint on file.  Subjective:   Phillip Powell. is a 78 y.o. very pleasant male patient with There is no height or weight on file to calculate BMI. who presents with the following:  Patient is actually here for 85-month follow-up.  I saw him in April, and he is a former patient of Dr. Tonita.  When I initially saw the patient, it was confusing how he was taking and managing his diabetes. I started him on metformin  XR 500 mg once a day He had been on glyburide  only on a as needed basis, so I scheduled this at 1 tablet p.o. twice daily. He had also been concerned about Mounjaro causing possible diarrhea, so I subsequently started him on Ozempic  to see if there was a difference here.  Blood pressure, currently on amlodipine  10 mg, hydrochlorothiazide  25 mg and atenolol  100 mg.  He has diabetes, and he is not on any cholesterol medication.    Diabetes Mellitus: Tolerating Medications: yes Compliance with diet: fair, There is no height or weight on file to calculate BMI. Exercise: minimal / intermittent Avg blood sugars at home: not checking Foot problems: none Hypoglycemia: none No nausea, vomitting, blurred vision, polyuria.  Lab Results  Component Value Date   HGBA1C 8.1 (A) 10/31/2023   HGBA1C 7.6 (H) 05/28/2023   HGBA1C 8.3 (H) 03/06/2023   Lab Results  Component Value Date   MICROALBUR 7.5 09/25/2022   LDLCALC 98 03/06/2023   CREATININE 1.12 07/03/2023    Wt Readings from Last 3 Encounters:  11/12/23 226 lb (102.5 kg)  10/31/23 220 lb (99.8 kg)  08/31/23 213 lb 13.5 oz (97 kg)    HTN: Tolerating all medications  without side effects Stable and at goal No CP, no sob. No HA.  BP Readings from Last 3 Encounters:  11/12/23 (!) 149/70  10/31/23 126/60  08/31/23 (!) 153/66    Basic Metabolic Panel:    Component Value Date/Time   NA 136 07/03/2023 0320   K 3.7 07/03/2023 0320   CL 99 07/03/2023 0320   CO2 25 07/03/2023 0320   BUN 22 07/03/2023 0320   CREATININE 1.12 07/03/2023 0320   CREATININE 0.91 03/06/2023 1114   GLUCOSE 260 (H) 07/03/2023 0320   CALCIUM 8.6 (L) 07/03/2023 0320    Lipids: Doing well, stable. Tolerating meds fine with no SE. Panel reviewed with patient.  Lipids: Lab Results  Component Value Date   CHOL 182 03/06/2023   Lab Results  Component Value Date   HDL 45 03/06/2023   Lab Results  Component Value Date   LDLCALC 98 03/06/2023   Lab Results  Component Value Date   TRIG 293 (H) 03/06/2023   Lab Results  Component Value Date   CHOLHDL 4.0 03/06/2023    Lab Results  Component Value Date   ALT 13 03/06/2023   AST 12 03/06/2023   ALKPHOS 58 11/22/2020   BILITOT 0.4 03/06/2023     Review of Systems is noted in the HPI, as appropriate  Objective:   There were no vitals taken for this visit.  GEN: No acute distress; alert,appropriate. PULM: Breathing comfortably in  no respiratory distress PSYCH: Normally interactive.   Laboratory and Imaging Data:  Assessment and Plan:   ***

## 2024-02-04 ENCOUNTER — Encounter: Payer: Self-pay | Admitting: Family Medicine

## 2024-02-04 ENCOUNTER — Ambulatory Visit (INDEPENDENT_AMBULATORY_CARE_PROVIDER_SITE_OTHER): Admitting: Family Medicine

## 2024-02-04 VITALS — BP 126/60 | HR 70 | Temp 98.0°F | Ht 66.5 in | Wt 221.5 lb

## 2024-02-04 DIAGNOSIS — E785 Hyperlipidemia, unspecified: Secondary | ICD-10-CM | POA: Diagnosis not present

## 2024-02-04 DIAGNOSIS — E559 Vitamin D deficiency, unspecified: Secondary | ICD-10-CM | POA: Diagnosis not present

## 2024-02-04 DIAGNOSIS — E1169 Type 2 diabetes mellitus with other specified complication: Secondary | ICD-10-CM | POA: Diagnosis not present

## 2024-02-04 DIAGNOSIS — Z79899 Other long term (current) drug therapy: Secondary | ICD-10-CM | POA: Diagnosis not present

## 2024-02-04 DIAGNOSIS — I10 Essential (primary) hypertension: Secondary | ICD-10-CM

## 2024-02-04 DIAGNOSIS — E1165 Type 2 diabetes mellitus with hyperglycemia: Secondary | ICD-10-CM | POA: Diagnosis not present

## 2024-02-04 DIAGNOSIS — Z7984 Long term (current) use of oral hypoglycemic drugs: Secondary | ICD-10-CM

## 2024-02-04 DIAGNOSIS — E538 Deficiency of other specified B group vitamins: Secondary | ICD-10-CM

## 2024-02-04 DIAGNOSIS — N183 Chronic kidney disease, stage 3 unspecified: Secondary | ICD-10-CM | POA: Diagnosis not present

## 2024-02-04 DIAGNOSIS — E1122 Type 2 diabetes mellitus with diabetic chronic kidney disease: Secondary | ICD-10-CM | POA: Diagnosis not present

## 2024-02-04 DIAGNOSIS — Z125 Encounter for screening for malignant neoplasm of prostate: Secondary | ICD-10-CM

## 2024-02-04 LAB — MICROALBUMIN / CREATININE URINE RATIO
Creatinine,U: 79.1 mg/dL
Microalb Creat Ratio: 143.5 mg/g — ABNORMAL HIGH (ref 0.0–30.0)
Microalb, Ur: 11.3 mg/dL — ABNORMAL HIGH (ref 0.0–1.9)

## 2024-02-04 LAB — HEPATIC FUNCTION PANEL
ALT: 12 U/L (ref 0–53)
AST: 12 U/L (ref 0–37)
Albumin: 4.2 g/dL (ref 3.5–5.2)
Alkaline Phosphatase: 64 U/L (ref 39–117)
Bilirubin, Direct: 0.1 mg/dL (ref 0.0–0.3)
Total Bilirubin: 0.6 mg/dL (ref 0.2–1.2)
Total Protein: 6.4 g/dL (ref 6.0–8.3)

## 2024-02-04 LAB — BASIC METABOLIC PANEL WITH GFR
BUN: 21 mg/dL (ref 6–23)
CO2: 30 meq/L (ref 19–32)
Calcium: 9.4 mg/dL (ref 8.4–10.5)
Chloride: 97 meq/L (ref 96–112)
Creatinine, Ser: 0.97 mg/dL (ref 0.40–1.50)
GFR: 75.77 mL/min (ref >60.00)
Glucose, Bld: 179 mg/dL — ABNORMAL HIGH (ref 70–99)
Potassium: 3.6 meq/L (ref 3.5–5.1)
Sodium: 138 meq/L (ref 135–145)

## 2024-02-04 LAB — LIPID PANEL
Cholesterol: 167 mg/dL (ref 0–200)
HDL: 37 mg/dL — ABNORMAL LOW (ref >39.00)
LDL Cholesterol: 87 mg/dL (ref 0–99)
NonHDL: 129.94
Total CHOL/HDL Ratio: 5
Triglycerides: 214 mg/dL — ABNORMAL HIGH (ref 0.0–149.0)
VLDL: 42.8 mg/dL — ABNORMAL HIGH (ref 0.0–40.0)

## 2024-02-04 LAB — POCT GLYCOSYLATED HEMOGLOBIN (HGB A1C): Hemoglobin A1C: 8.3 % — AB (ref 4.0–5.6)

## 2024-02-04 LAB — TSH: TSH: 1.54 u[IU]/mL (ref 0.35–5.50)

## 2024-02-04 LAB — VITAMIN B12: Vitamin B-12: 225 pg/mL (ref 211–911)

## 2024-02-04 LAB — VITAMIN D 25 HYDROXY (VIT D DEFICIENCY, FRACTURES): VITD: 31.16 ng/mL (ref 30.00–100.00)

## 2024-02-04 MED ORDER — OZEMPIC (0.25 OR 0.5 MG/DOSE) 2 MG/3ML ~~LOC~~ SOPN
0.5000 mg | PEN_INJECTOR | SUBCUTANEOUS | 0 refills | Status: DC
Start: 2024-02-04 — End: 2024-06-16

## 2024-02-04 MED ORDER — SEMAGLUTIDE (1 MG/DOSE) 4 MG/3ML ~~LOC~~ SOPN: 1.0000 mg | PEN_INJECTOR | SUBCUTANEOUS | 3 refills | Status: DC

## 2024-02-04 NOTE — Addendum Note (Signed)
 Addended by: ISADORA RAISIN on: 02/04/2024 12:54 PM   Modules accepted: Orders

## 2024-02-04 NOTE — Patient Instructions (Signed)
 Stop Glyburide  completely  Increase Ozempic  dose to 0.5 mg once a week for 4 weeks  After one month, increase the Ozempic  dose to 1 mg a week

## 2024-02-06 LAB — PSA, TOTAL WITH REFLEX TO PSA, FREE: PSA, Total: 0.4 ng/mL (ref <=4.0)

## 2024-02-11 ENCOUNTER — Ambulatory Visit: Payer: Self-pay | Admitting: Family Medicine

## 2024-02-14 ENCOUNTER — Other Ambulatory Visit (INDEPENDENT_AMBULATORY_CARE_PROVIDER_SITE_OTHER)

## 2024-02-14 DIAGNOSIS — Z79899 Other long term (current) drug therapy: Secondary | ICD-10-CM | POA: Diagnosis not present

## 2024-02-14 LAB — CBC WITH DIFFERENTIAL/PLATELET
Basophils Absolute: 0 K/uL (ref 0.0–0.1)
Basophils Relative: 0.3 % (ref 0.0–3.0)
Eosinophils Absolute: 0.2 K/uL (ref 0.0–0.7)
Eosinophils Relative: 3 % (ref 0.0–5.0)
HCT: 40.2 % (ref 39.0–52.0)
Hemoglobin: 13.6 g/dL (ref 13.0–17.0)
Lymphocytes Relative: 31.9 % (ref 12.0–46.0)
Lymphs Abs: 2.1 K/uL (ref 0.7–4.0)
MCHC: 33.8 g/dL (ref 30.0–36.0)
MCV: 78 fl (ref 78.0–100.0)
Monocytes Absolute: 0.5 K/uL (ref 0.1–1.0)
Monocytes Relative: 8.2 % (ref 3.0–12.0)
Neutro Abs: 3.8 K/uL (ref 1.4–7.7)
Neutrophils Relative %: 56.6 % (ref 43.0–77.0)
Platelets: 59 K/uL — ABNORMAL LOW (ref 150.0–400.0)
RBC: 5.16 Mil/uL (ref 4.22–5.81)
RDW: 14.6 % (ref 11.5–15.5)
WBC: 6.6 K/uL (ref 4.0–10.5)

## 2024-04-13 ENCOUNTER — Other Ambulatory Visit: Payer: Self-pay | Admitting: Family Medicine

## 2024-05-26 LAB — OPHTHALMOLOGY REPORT-SCANNED

## 2024-06-09 ENCOUNTER — Ambulatory Visit: Admitting: Family Medicine

## 2024-06-15 NOTE — Progress Notes (Signed)
 Maclovia Uher T. Kiven Vangilder, MD, CAQ Sports Medicine [facility] at [facility] [address] [place], [address]  Phone: [phone]  FAX: [phone]  Jovane Foutz. - 78 y.o. male  MRN [id number]  Date of Birth: 14-Apr-1946  Date: 06/16/2024  PCP: Watt Mirza, MD  Referral: Watt Mirza, MD  No chief complaint on file.  Subjective:   Natthew Marlatt. is a 78 y.o. very pleasant male patient with There is no height or weight on file to calculate BMI. who presents with the following:  Discussed the use of AI scribe software for clinical note transcription with the patient, who gave verbal consent to proceed.  Complicated medical patient who presents with ongoing multiple medical follow-up.  There had been variability in how he it was taking his medication with Dr. Tonita having him do some add needed oral medications.  The last time I saw him he had stopped his Trulicity . -Currently on metformin  500 mg a day. -Glyburide  -I stopped this the last time he was in the office -Started him on Ozempic  at 0.25 mg, and the last time I saw him we had titrated this up to 1 mg  Follow-up hypertension hyperlipidemia Currently not on statin. History of Present Illness     Review of Systems is noted in the HPI, as appropriate  Objective:   There were no vitals taken for this visit.  GEN: No acute distress; alert,appropriate. PULM: Breathing comfortably in no respiratory distress PSYCH: Normally interactive.   Laboratory and Imaging Data:  Assessment and Plan:   No diagnosis found. Assessment & Plan   Medication Management during today's office visit: No orders of the defined types were placed in this encounter.  There are no discontinued medications.  Orders placed today for conditions managed today: No orders of the defined types were placed in this encounter.   Disposition: No follow-ups on file.  [employer] One  speech-to-text software was used for transcription in this dictation.  Possible transcriptional errors can occur using Animal nutritionist.   Signed,  Mirza DASEN. Hend Mccarrell, MD   Outpatient Encounter Medications as of 06/16/2024  Medication Sig   acetaminophen  (TYLENOL ) 500 MG tablet Take 1,000 mg by mouth every 6 (six) hours as needed for moderate pain.   amLODipine  (NORVASC ) 10 MG tablet Take 1 tablet (10 mg total) by mouth daily.   atenolol  (TENORMIN ) 100 MG tablet Take 1 tablet (100 mg total) by mouth daily.   blood glucose meter kit and supplies KIT Dispense based on  insurance coverage. Test blood sugar twice daily. #100 lancet and strips with 3 refills   Blood Glucose Monitoring Suppl (ONETOUCH VERIO) w/Device KIT Test blood sugar once daily.   Blood Glucose Monitoring Suppl DEVI Use meter three times a day to check blood sugar   famotidine  (PEPCID ) 40 MG tablet Take 1 tablet (40 mg total) by mouth daily as needed for heartburn or indigestion.   glucose blood (ONETOUCH VERIO) test strip Test sugars 3 x a day for hypoglycemia/hyperglycemia and fluctuating sugars   hydrochlorothiazide  (HYDRODIURIL ) 25 MG tablet Take 1 tablet (25 mg total) by mouth daily.   Lancets (ONETOUCH ULTRASOFT) lancets Test blood sugar once daily   leflunomide  (ARAVA ) 10 MG tablet Take 10 mg by mouth daily.   loperamide  (IMODIUM  A-D) 2 MG tablet Take 2-4 mg by mouth 4 (four) times daily as needed for diarrhea or loose stools.   meclizine  (ANTIVERT ) 25 MG tablet Take 25 mg by  mouth 3 (three) times daily as needed for dizziness.   metFORMIN  (GLUCOPHAGE -XR) 500 MG 24 hr tablet Take 1 tablet (500 mg total) by mouth daily with breakfast.   modafinil  (PROVIGIL ) 200 MG tablet Take 1 tablet (200 mg total) by mouth daily.   ondansetron  (ZOFRAN ) 4 MG tablet Take 1 tablet (4 mg total) by mouth every 6 (six) hours as needed for nausea.   Semaglutide , 1 MG/DOSE, 4 MG/3ML SOPN Inject 1 mg as directed once a week.   Semaglutide ,0.25  or 0.5MG /DOS, (OZEMPIC , 0.25 OR 0.5 MG/DOSE,) 2 MG/3ML SOPN Inject 0.5 mg into the skin once a week.   tamsulosin  (FLOMAX ) 0.4 MG CAPS capsule Take 1 capsule (0.4 mg total) by mouth daily.   No facility-administered encounter medications on file as of 06/16/2024.

## 2024-06-16 ENCOUNTER — Ambulatory Visit (INDEPENDENT_AMBULATORY_CARE_PROVIDER_SITE_OTHER): Admitting: Family Medicine

## 2024-06-16 ENCOUNTER — Encounter: Payer: Self-pay | Admitting: Family Medicine

## 2024-06-16 VITALS — BP 138/64 | HR 76 | Temp 97.7°F | Ht 66.5 in | Wt 222.5 lb

## 2024-06-16 DIAGNOSIS — Z7984 Long term (current) use of oral hypoglycemic drugs: Secondary | ICD-10-CM

## 2024-06-16 DIAGNOSIS — E1169 Type 2 diabetes mellitus with other specified complication: Secondary | ICD-10-CM | POA: Diagnosis not present

## 2024-06-16 DIAGNOSIS — N183 Chronic kidney disease, stage 3 unspecified: Secondary | ICD-10-CM

## 2024-06-16 DIAGNOSIS — E1122 Type 2 diabetes mellitus with diabetic chronic kidney disease: Secondary | ICD-10-CM

## 2024-06-16 DIAGNOSIS — E785 Hyperlipidemia, unspecified: Secondary | ICD-10-CM

## 2024-06-16 DIAGNOSIS — I10 Essential (primary) hypertension: Secondary | ICD-10-CM

## 2024-06-16 DIAGNOSIS — E1165 Type 2 diabetes mellitus with hyperglycemia: Secondary | ICD-10-CM | POA: Diagnosis not present

## 2024-06-16 LAB — POCT GLYCOSYLATED HEMOGLOBIN (HGB A1C): Hemoglobin A1C: 8.2 % — AB (ref 4.0–5.6)

## 2024-06-16 MED ORDER — ROSUVASTATIN CALCIUM 10 MG PO TABS: 10.0000 mg | ORAL_TABLET | Freq: Every day | ORAL | 3 refills | Status: AC

## 2024-06-16 MED ORDER — CHOLESTYRAMINE 4 G PO PACK: 4.0000 g | PACK | Freq: Three times a day (TID) | ORAL | 3 refills | Status: AC

## 2024-06-16 MED ORDER — GLYBURIDE 2.5 MG PO TABS
2.5000 mg | ORAL_TABLET | Freq: Every day | ORAL | 3 refills | Status: AC
Start: 2024-06-16 — End: ?

## 2024-06-16 MED ORDER — SEMAGLUTIDE (2 MG/DOSE) 8 MG/3ML ~~LOC~~ SOPN: 2.0000 mg | PEN_INJECTOR | SUBCUTANEOUS | 1 refills | Status: AC

## 2024-06-16 NOTE — Patient Instructions (Addendum)
 RSV vaccine  Stop Metformin   Start Glyburide  one tablet a day for 1 week, then increase to 1 tablet twice a day

## 2024-06-23 ENCOUNTER — Other Ambulatory Visit (HOSPITAL_COMMUNITY): Payer: Self-pay

## 2024-06-23 ENCOUNTER — Other Ambulatory Visit (HOSPITAL_COMMUNITY): Payer: Self-pay | Admitting: Rheumatology

## 2024-06-23 ENCOUNTER — Ambulatory Visit (HOSPITAL_COMMUNITY)
Admission: RE | Admit: 2024-06-23 | Discharge: 2024-06-23 | Disposition: A | Source: Ambulatory Visit | Attending: Surgery | Admitting: Surgery

## 2024-06-23 DIAGNOSIS — M79605 Pain in left leg: Secondary | ICD-10-CM

## 2024-06-23 MED ORDER — RSVPREF3 VAC RECOMB ADJUVANTED 120 MCG/0.5ML IM SUSR
0.5000 mL | Freq: Once | INTRAMUSCULAR | 0 refills | Status: AC
  Filled 2024-06-23: qty 0.5, 1d supply, fill #0

## 2024-06-24 ENCOUNTER — Other Ambulatory Visit (HOSPITAL_COMMUNITY): Payer: Self-pay

## 2024-09-15 ENCOUNTER — Ambulatory Visit: Admitting: Family Medicine

## 2024-11-10 ENCOUNTER — Ambulatory Visit: Admitting: Neurology
